# Patient Record
Sex: Male | Born: 1955 | Race: White | Hispanic: No | Marital: Single | Smoking: Former smoker
Health system: Southern US, Community
[De-identification: ages and names within clinical notes are randomized; demographics above are authoritative.]

## PROBLEM LIST (undated history)

## (undated) DIAGNOSIS — K219 Gastro-esophageal reflux disease without esophagitis: Secondary | ICD-10-CM

## (undated) DIAGNOSIS — C140 Malignant neoplasm of pharynx, unspecified: Secondary | ICD-10-CM

## (undated) DIAGNOSIS — E039 Hypothyroidism, unspecified: Secondary | ICD-10-CM

## (undated) DIAGNOSIS — D126 Benign neoplasm of colon, unspecified: Secondary | ICD-10-CM

## (undated) DIAGNOSIS — T4145XA Adverse effect of unspecified anesthetic, initial encounter: Secondary | ICD-10-CM

## (undated) DIAGNOSIS — F329 Major depressive disorder, single episode, unspecified: Secondary | ICD-10-CM

## (undated) DIAGNOSIS — C3490 Malignant neoplasm of unspecified part of unspecified bronchus or lung: Secondary | ICD-10-CM

## (undated) DIAGNOSIS — K645 Perianal venous thrombosis: Secondary | ICD-10-CM

## (undated) DIAGNOSIS — J45909 Unspecified asthma, uncomplicated: Secondary | ICD-10-CM

## (undated) DIAGNOSIS — E559 Vitamin D deficiency, unspecified: Secondary | ICD-10-CM

## (undated) DIAGNOSIS — J438 Other emphysema: Secondary | ICD-10-CM

## (undated) DIAGNOSIS — J309 Allergic rhinitis, unspecified: Secondary | ICD-10-CM

## (undated) DIAGNOSIS — I2510 Atherosclerotic heart disease of native coronary artery without angina pectoris: Secondary | ICD-10-CM

## (undated) DIAGNOSIS — H5440 Blindness, one eye, unspecified eye: Secondary | ICD-10-CM

## (undated) DIAGNOSIS — R0600 Dyspnea, unspecified: Secondary | ICD-10-CM

## (undated) DIAGNOSIS — F419 Anxiety disorder, unspecified: Secondary | ICD-10-CM

## (undated) DIAGNOSIS — T8859XA Other complications of anesthesia, initial encounter: Secondary | ICD-10-CM

## (undated) DIAGNOSIS — F32A Depression, unspecified: Secondary | ICD-10-CM

## (undated) HISTORY — DX: Perianal venous thrombosis: K64.5

## (undated) HISTORY — DX: Hypothyroidism, unspecified: E03.9

## (undated) HISTORY — PX: LUNG LOBECTOMY: SHX167

## (undated) HISTORY — PX: ROTATOR CUFF REPAIR: SHX139

## (undated) HISTORY — DX: Atherosclerotic heart disease of native coronary artery without angina pectoris: I25.10

## (undated) HISTORY — DX: Vitamin D deficiency, unspecified: E55.9

## (undated) HISTORY — DX: Other emphysema: J43.8

## (undated) HISTORY — DX: Dyspnea, unspecified: R06.00

## (undated) HISTORY — PX: COLONOSCOPY: SHX174

## (undated) HISTORY — DX: Allergic rhinitis, unspecified: J30.9

## (undated) HISTORY — PX: SHOULDER SURGERY: SHX246

## (undated) HISTORY — PX: THROAT SURGERY: SHX803

## (undated) HISTORY — DX: Benign neoplasm of colon, unspecified: D12.6

## (undated) HISTORY — DX: Blindness, one eye, unspecified eye: H54.40

## (undated) HISTORY — DX: Unspecified asthma, uncomplicated: J45.909

## (undated) HISTORY — PX: HERNIA REPAIR: SHX51

---
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
## 1998-12-21 ENCOUNTER — Emergency Department (HOSPITAL_COMMUNITY): Admission: EM | Admit: 1998-12-21 | Discharge: 1998-12-21 | Payer: Self-pay | Admitting: Emergency Medicine

## 1999-10-15 ENCOUNTER — Ambulatory Visit (HOSPITAL_COMMUNITY): Admission: RE | Admit: 1999-10-15 | Discharge: 1999-10-15 | Payer: Self-pay | Admitting: Neurosurgery

## 1999-10-15 ENCOUNTER — Encounter: Payer: Self-pay | Admitting: Neurosurgery

## 2005-05-17 ENCOUNTER — Ambulatory Visit (HOSPITAL_COMMUNITY): Admission: RE | Admit: 2005-05-17 | Discharge: 2005-05-17 | Payer: Self-pay | Admitting: Thoracic Surgery

## 2005-05-28 ENCOUNTER — Inpatient Hospital Stay (HOSPITAL_COMMUNITY): Admission: RE | Admit: 2005-05-28 | Discharge: 2005-06-03 | Payer: Self-pay | Admitting: Thoracic Surgery

## 2005-05-28 ENCOUNTER — Encounter (INDEPENDENT_AMBULATORY_CARE_PROVIDER_SITE_OTHER): Payer: Self-pay | Admitting: *Deleted

## 2005-05-30 ENCOUNTER — Ambulatory Visit: Payer: Self-pay | Admitting: Internal Medicine

## 2005-06-11 ENCOUNTER — Encounter: Admission: RE | Admit: 2005-06-11 | Discharge: 2005-06-11 | Payer: Self-pay | Admitting: Thoracic Surgery

## 2005-07-03 ENCOUNTER — Encounter: Admission: RE | Admit: 2005-07-03 | Discharge: 2005-07-03 | Payer: Self-pay | Admitting: Thoracic Surgery

## 2005-07-24 ENCOUNTER — Ambulatory Visit: Payer: Self-pay | Admitting: Internal Medicine

## 2005-08-14 ENCOUNTER — Encounter: Admission: RE | Admit: 2005-08-14 | Discharge: 2005-08-14 | Payer: Self-pay | Admitting: Thoracic Surgery

## 2005-09-11 ENCOUNTER — Ambulatory Visit: Payer: Self-pay | Admitting: Internal Medicine

## 2005-09-23 ENCOUNTER — Ambulatory Visit (HOSPITAL_COMMUNITY): Admission: RE | Admit: 2005-09-23 | Discharge: 2005-09-23 | Payer: Self-pay | Admitting: Internal Medicine

## 2005-10-02 ENCOUNTER — Ambulatory Visit (HOSPITAL_COMMUNITY): Admission: RE | Admit: 2005-10-02 | Discharge: 2005-10-02 | Payer: Self-pay | Admitting: Internal Medicine

## 2005-11-13 ENCOUNTER — Encounter: Admission: RE | Admit: 2005-11-13 | Discharge: 2005-11-13 | Payer: Self-pay | Admitting: Thoracic Surgery

## 2005-12-11 ENCOUNTER — Ambulatory Visit: Payer: Self-pay | Admitting: Internal Medicine

## 2005-12-18 ENCOUNTER — Encounter: Admission: RE | Admit: 2005-12-18 | Discharge: 2005-12-18 | Payer: Self-pay | Admitting: Orthopedic Surgery

## 2006-01-15 ENCOUNTER — Encounter: Admission: RE | Admit: 2006-01-15 | Discharge: 2006-01-15 | Payer: Self-pay | Admitting: Orthopedic Surgery

## 2006-02-11 ENCOUNTER — Encounter: Admission: RE | Admit: 2006-02-11 | Discharge: 2006-02-11 | Payer: Self-pay | Admitting: Otolaryngology

## 2006-02-28 ENCOUNTER — Ambulatory Visit (HOSPITAL_BASED_OUTPATIENT_CLINIC_OR_DEPARTMENT_OTHER): Admission: RE | Admit: 2006-02-28 | Discharge: 2006-03-01 | Payer: Self-pay | Admitting: Otolaryngology

## 2006-02-28 ENCOUNTER — Encounter (INDEPENDENT_AMBULATORY_CARE_PROVIDER_SITE_OTHER): Payer: Self-pay | Admitting: Specialist

## 2006-03-12 ENCOUNTER — Ambulatory Visit: Payer: Self-pay | Admitting: Internal Medicine

## 2006-03-17 ENCOUNTER — Ambulatory Visit (HOSPITAL_COMMUNITY): Admission: RE | Admit: 2006-03-17 | Discharge: 2006-03-17 | Payer: Self-pay | Admitting: Internal Medicine

## 2006-03-24 ENCOUNTER — Ambulatory Visit (HOSPITAL_COMMUNITY): Admission: RE | Admit: 2006-03-24 | Discharge: 2006-03-24 | Payer: Self-pay | Admitting: Internal Medicine

## 2006-04-01 ENCOUNTER — Ambulatory Visit (HOSPITAL_COMMUNITY): Admission: RE | Admit: 2006-04-01 | Discharge: 2006-04-01 | Payer: Self-pay | Admitting: Thoracic Surgery

## 2006-04-01 ENCOUNTER — Encounter (INDEPENDENT_AMBULATORY_CARE_PROVIDER_SITE_OTHER): Payer: Self-pay | Admitting: *Deleted

## 2006-04-01 ENCOUNTER — Ambulatory Visit: Admission: RE | Admit: 2006-04-01 | Discharge: 2006-06-10 | Payer: Self-pay | Admitting: Radiation Oncology

## 2006-04-07 LAB — CBC WITH DIFFERENTIAL/PLATELET
Basophils Absolute: 0.1 10*3/uL (ref 0.0–0.1)
EOS%: 1.6 % (ref 0.0–7.0)
Eosinophils Absolute: 0.1 10*3/uL (ref 0.0–0.5)
HGB: 15.3 g/dL (ref 13.0–17.1)
MCV: 82.2 fL (ref 81.6–98.0)
MONO%: 8.6 % (ref 0.0–13.0)
NEUT#: 4.2 10*3/uL (ref 1.5–6.5)
RBC: 5.36 10*6/uL (ref 4.20–5.71)
RDW: 11.9 % (ref 11.2–14.6)
WBC: 6.5 10*3/uL (ref 4.0–10.0)
lymph#: 1.5 10*3/uL (ref 0.9–3.3)

## 2006-04-07 LAB — COMPREHENSIVE METABOLIC PANEL
AST: 17 U/L (ref 0–37)
Albumin: 4.7 g/dL (ref 3.5–5.2)
Alkaline Phosphatase: 116 U/L (ref 39–117)
Calcium: 8.9 mg/dL (ref 8.4–10.5)
Chloride: 107 mEq/L (ref 96–112)
Glucose, Bld: 99 mg/dL (ref 70–99)
Potassium: 3.9 mEq/L (ref 3.5–5.3)
Sodium: 140 mEq/L (ref 135–145)
Total Protein: 7 g/dL (ref 6.0–8.3)

## 2006-04-14 LAB — COMPREHENSIVE METABOLIC PANEL
ALT: 36 U/L (ref 0–40)
AST: 29 U/L (ref 0–37)
Albumin: 5 g/dL (ref 3.5–5.2)
Alkaline Phosphatase: 129 U/L — ABNORMAL HIGH (ref 39–117)
Chloride: 104 mEq/L (ref 96–112)
Potassium: 3.8 mEq/L (ref 3.5–5.3)
Sodium: 139 mEq/L (ref 135–145)
Total Protein: 7.8 g/dL (ref 6.0–8.3)

## 2006-04-14 LAB — CBC WITH DIFFERENTIAL/PLATELET
BASO%: 1.6 % (ref 0.0–2.0)
EOS%: 1.6 % (ref 0.0–7.0)
MCH: 29.4 pg (ref 28.0–33.4)
MCV: 82.3 fL (ref 81.6–98.0)
MONO%: 6.1 % (ref 0.0–13.0)
RBC: 5.36 10*6/uL (ref 4.20–5.71)
RDW: 11.5 % (ref 11.2–14.6)
lymph#: 1.2 10*3/uL (ref 0.9–3.3)

## 2006-04-21 LAB — CBC WITH DIFFERENTIAL/PLATELET
BASO%: 0.3 % (ref 0.0–2.0)
Basophils Absolute: 0 10*3/uL (ref 0.0–0.1)
EOS%: 1.1 % (ref 0.0–7.0)
Eosinophils Absolute: 0.1 10*3/uL (ref 0.0–0.5)
HCT: 39.7 % (ref 38.7–49.9)
HGB: 13.7 g/dL (ref 13.0–17.1)
LYMPH%: 12 % — ABNORMAL LOW (ref 14.0–48.0)
MCH: 29.3 pg (ref 28.0–33.4)
MCHC: 34.6 g/dL (ref 32.0–35.9)
MCV: 84.8 fL (ref 81.6–98.0)
MONO#: 0.5 10*3/uL (ref 0.1–0.9)
MONO%: 8.4 % (ref 0.0–13.0)
NEUT#: 5 10*3/uL (ref 1.5–6.5)
NEUT%: 78.2 % — ABNORMAL HIGH (ref 40.0–75.0)
Platelets: 213 10*3/uL (ref 145–400)
RBC: 4.69 10*6/uL (ref 4.20–5.71)
RDW: 12.9 % (ref 11.2–14.6)
WBC: 6.4 10*3/uL (ref 4.0–10.0)
lymph#: 0.8 10*3/uL — ABNORMAL LOW (ref 0.9–3.3)

## 2006-04-21 LAB — COMPREHENSIVE METABOLIC PANEL
Alkaline Phosphatase: 140 U/L — ABNORMAL HIGH (ref 39–117)
BUN: 14 mg/dL (ref 6–23)
Creatinine, Ser: 0.9 mg/dL (ref 0.4–1.5)
Glucose, Bld: 99 mg/dL (ref 70–99)
Total Bilirubin: 0.9 mg/dL (ref 0.3–1.2)

## 2006-04-28 ENCOUNTER — Ambulatory Visit: Payer: Self-pay | Admitting: Internal Medicine

## 2006-04-28 LAB — CBC WITH DIFFERENTIAL/PLATELET
Basophils Absolute: 0.1 10*3/uL (ref 0.0–0.1)
Eosinophils Absolute: 0.1 10*3/uL (ref 0.0–0.5)
HCT: 40 % (ref 38.7–49.9)
HGB: 14.3 g/dL (ref 13.0–17.1)
LYMPH%: 10.8 % — ABNORMAL LOW (ref 14.0–48.0)
MCV: 81.6 fL (ref 81.6–98.0)
MONO%: 10.9 % (ref 0.0–13.0)
NEUT#: 4 10*3/uL (ref 1.5–6.5)
NEUT%: 75.6 % — ABNORMAL HIGH (ref 40.0–75.0)
Platelets: 213 10*3/uL (ref 145–400)
RDW: 11.4 % (ref 11.2–14.6)

## 2006-04-28 LAB — COMPREHENSIVE METABOLIC PANEL
Albumin: 4.7 g/dL (ref 3.5–5.2)
Alkaline Phosphatase: 149 U/L — ABNORMAL HIGH (ref 39–117)
BUN: 15 mg/dL (ref 6–23)
Calcium: 9 mg/dL (ref 8.4–10.5)
Glucose, Bld: 83 mg/dL (ref 70–99)
Potassium: 4.2 mEq/L (ref 3.5–5.3)

## 2006-04-29 ENCOUNTER — Observation Stay (HOSPITAL_COMMUNITY): Admission: EM | Admit: 2006-04-29 | Discharge: 2006-04-30 | Payer: Self-pay | Admitting: Emergency Medicine

## 2006-04-29 ENCOUNTER — Ambulatory Visit: Payer: Self-pay | Admitting: Internal Medicine

## 2006-05-05 LAB — COMPREHENSIVE METABOLIC PANEL
AST: 30 U/L (ref 0–37)
Albumin: 4.4 g/dL (ref 3.5–5.2)
Alkaline Phosphatase: 133 U/L — ABNORMAL HIGH (ref 39–117)
BUN: 16 mg/dL (ref 6–23)
Glucose, Bld: 93 mg/dL (ref 70–99)
Potassium: 4.2 mEq/L (ref 3.5–5.3)
Total Bilirubin: 0.9 mg/dL (ref 0.3–1.2)

## 2006-05-05 LAB — CBC WITH DIFFERENTIAL/PLATELET
Basophils Absolute: 0.1 10*3/uL (ref 0.0–0.1)
EOS%: 1.2 % (ref 0.0–7.0)
Eosinophils Absolute: 0.1 10*3/uL (ref 0.0–0.5)
HGB: 13.8 g/dL (ref 13.0–17.1)
LYMPH%: 12.5 % — ABNORMAL LOW (ref 14.0–48.0)
MCH: 30.1 pg (ref 28.0–33.4)
MCV: UNDETERMINED fL (ref 81.6–98.0)
MONO%: 10.1 % (ref 0.0–13.0)
Platelets: 159 10*3/uL (ref 145–400)
RBC: UNDETERMINED 10*6/uL (ref 4.20–5.71)
RDW: UNDETERMINED % (ref 11.2–14.6)

## 2006-05-12 LAB — CBC WITH DIFFERENTIAL/PLATELET
Basophils Absolute: 0.1 10*3/uL (ref 0.0–0.1)
EOS%: 0.8 % (ref 0.0–7.0)
Eosinophils Absolute: 0 10*3/uL (ref 0.0–0.5)
HCT: 36 % — ABNORMAL LOW (ref 38.7–49.9)
HGB: 13.1 g/dL (ref 13.0–17.1)
LYMPH%: 9.4 % — ABNORMAL LOW (ref 14.0–48.0)
MCH: 30 pg (ref 28.0–33.4)
MCV: 82.3 fL (ref 81.6–98.0)
MONO%: 13 % (ref 0.0–13.0)
NEUT#: 4.1 10*3/uL (ref 1.5–6.5)
NEUT%: 75 % (ref 40.0–75.0)
Platelets: 195 10*3/uL (ref 145–400)
RDW: 12.2 % (ref 11.2–14.6)

## 2006-05-12 LAB — COMPREHENSIVE METABOLIC PANEL
AST: 26 U/L (ref 0–37)
Albumin: 4.1 g/dL (ref 3.5–5.2)
Alkaline Phosphatase: 166 U/L — ABNORMAL HIGH (ref 39–117)
BUN: 12 mg/dL (ref 6–23)
Creatinine, Ser: 0.8 mg/dL (ref 0.4–1.5)
Glucose, Bld: 100 mg/dL — ABNORMAL HIGH (ref 70–99)
Potassium: 4 mEq/L (ref 3.5–5.3)

## 2006-05-15 LAB — COMPREHENSIVE METABOLIC PANEL
Albumin: 4.5 g/dL (ref 3.5–5.2)
Alkaline Phosphatase: 184 U/L — ABNORMAL HIGH (ref 39–117)
BUN: 15 mg/dL (ref 6–23)
CO2: 27 mEq/L (ref 19–32)
Calcium: 9.4 mg/dL (ref 8.4–10.5)
Chloride: 97 mEq/L (ref 96–112)
Glucose, Bld: 112 mg/dL — ABNORMAL HIGH (ref 70–99)
Potassium: 4.2 mEq/L (ref 3.5–5.3)
Sodium: 142 mEq/L (ref 135–145)
Total Protein: 7.8 g/dL (ref 6.0–8.3)

## 2006-05-15 LAB — CBC WITH DIFFERENTIAL/PLATELET
Basophils Absolute: 0 10*3/uL (ref 0.0–0.1)
Eosinophils Absolute: 0 10*3/uL (ref 0.0–0.5)
HCT: 41.3 % (ref 38.7–49.9)
HGB: 14.4 g/dL (ref 13.0–17.1)
MCV: 85.1 fL (ref 81.6–98.0)
MONO%: 13.1 % — ABNORMAL HIGH (ref 0.0–13.0)
NEUT#: 3.9 10*3/uL (ref 1.5–6.5)
NEUT%: 75.6 % — ABNORMAL HIGH (ref 40.0–75.0)
RDW: 13.4 % (ref 11.2–14.6)
lymph#: 0.5 10*3/uL — ABNORMAL LOW (ref 0.9–3.3)

## 2006-06-04 ENCOUNTER — Encounter: Admission: RE | Admit: 2006-06-04 | Discharge: 2006-06-04 | Payer: Self-pay | Admitting: Thoracic Surgery

## 2006-06-23 ENCOUNTER — Ambulatory Visit: Payer: Self-pay | Admitting: Internal Medicine

## 2006-06-24 ENCOUNTER — Ambulatory Visit (HOSPITAL_COMMUNITY): Admission: RE | Admit: 2006-06-24 | Discharge: 2006-06-24 | Payer: Self-pay | Admitting: Internal Medicine

## 2006-06-26 LAB — CBC WITH DIFFERENTIAL/PLATELET
BASO%: 0.8 % (ref 0.0–2.0)
Eosinophils Absolute: 0.2 10*3/uL (ref 0.0–0.5)
LYMPH%: 13.3 % — ABNORMAL LOW (ref 14.0–48.0)
MCHC: 34.5 g/dL (ref 32.0–35.9)
MCV: 88.6 fL (ref 81.6–98.0)
MONO#: 0.5 10*3/uL (ref 0.1–0.9)
MONO%: 11.3 % (ref 0.0–13.0)
NEUT#: 3.1 10*3/uL (ref 1.5–6.5)
Platelets: 175 10*3/uL (ref 145–400)
RBC: 4.23 10*6/uL (ref 4.20–5.71)
RDW: 15 % — ABNORMAL HIGH (ref 11.2–14.6)
WBC: 4.4 10*3/uL (ref 4.0–10.0)

## 2006-06-26 LAB — COMPREHENSIVE METABOLIC PANEL
ALT: 20 U/L (ref 0–40)
AST: 20 U/L (ref 0–37)
Albumin: 4.3 g/dL (ref 3.5–5.2)
Alkaline Phosphatase: 117 U/L (ref 39–117)
Glucose, Bld: 82 mg/dL (ref 70–99)
Potassium: 3.6 mEq/L (ref 3.5–5.3)
Sodium: 140 mEq/L (ref 135–145)
Total Bilirubin: 0.8 mg/dL (ref 0.3–1.2)
Total Protein: 6.9 g/dL (ref 6.0–8.3)

## 2006-07-03 LAB — CBC WITH DIFFERENTIAL/PLATELET
Eosinophils Absolute: 0.2 10*3/uL (ref 0.0–0.5)
LYMPH%: 13.6 % — ABNORMAL LOW (ref 14.0–48.0)
MCH: 30.5 pg (ref 28.0–33.4)
MCHC: 34.1 g/dL (ref 32.0–35.9)
MCV: 89.5 fL (ref 81.6–98.0)
MONO%: 10.7 % (ref 0.0–13.0)
NEUT#: 3.5 10*3/uL (ref 1.5–6.5)
Platelets: 183 10*3/uL (ref 145–400)
RBC: 4.48 10*6/uL (ref 4.20–5.71)

## 2006-07-03 LAB — COMPREHENSIVE METABOLIC PANEL
Alkaline Phosphatase: 117 U/L (ref 39–117)
Glucose, Bld: 96 mg/dL (ref 70–99)
Sodium: 141 mEq/L (ref 135–145)
Total Bilirubin: 0.5 mg/dL (ref 0.3–1.2)
Total Protein: 7.2 g/dL (ref 6.0–8.3)

## 2006-07-10 LAB — CBC WITH DIFFERENTIAL/PLATELET
BASO%: 0.3 % (ref 0.0–2.0)
Basophils Absolute: 0 10*3/uL (ref 0.0–0.1)
EOS%: 0 % (ref 0.0–7.0)
Eosinophils Absolute: 0 10*3/uL (ref 0.0–0.5)
HCT: 41.1 % (ref 38.7–49.9)
HGB: 14.4 g/dL (ref 13.0–17.1)
LYMPH%: 3.6 % — ABNORMAL LOW (ref 14.0–48.0)
MCH: 30.8 pg (ref 28.0–33.4)
MCHC: 35.1 g/dL (ref 32.0–35.9)
MCV: 87.7 fL (ref 81.6–98.0)
MONO#: 0.4 10*3/uL (ref 0.1–0.9)
MONO%: 3.2 % (ref 0.0–13.0)
NEUT#: 12.6 10*3/uL — ABNORMAL HIGH (ref 1.5–6.5)
NEUT%: 92.9 % — ABNORMAL HIGH (ref 40.0–75.0)
Platelets: 225 10*3/uL (ref 145–400)
RBC: 4.69 10*6/uL (ref 4.20–5.71)
RDW: 11.6 % (ref 11.2–14.6)
WBC: 13.6 10*3/uL — ABNORMAL HIGH (ref 4.0–10.0)
lymph#: 0.5 10*3/uL — ABNORMAL LOW (ref 0.9–3.3)

## 2006-07-17 LAB — CBC WITH DIFFERENTIAL/PLATELET
Eosinophils Absolute: 0 10*3/uL (ref 0.0–0.5)
HCT: 40.9 % (ref 38.7–49.9)
LYMPH%: 7.5 % — ABNORMAL LOW (ref 14.0–48.0)
MONO#: 1.2 10*3/uL — ABNORMAL HIGH (ref 0.1–0.9)
NEUT#: 7.8 10*3/uL — ABNORMAL HIGH (ref 1.5–6.5)
Platelets: 139 10*3/uL — ABNORMAL LOW (ref 145–400)
RBC: 4.59 10*6/uL (ref 4.20–5.71)
WBC: 9.7 10*3/uL (ref 4.0–10.0)
lymph#: 0.7 10*3/uL — ABNORMAL LOW (ref 0.9–3.3)

## 2006-07-17 LAB — COMPREHENSIVE METABOLIC PANEL
Albumin: 4.4 g/dL (ref 3.5–5.2)
CO2: 28 mEq/L (ref 19–32)
Calcium: 9.2 mg/dL (ref 8.4–10.5)
Chloride: 102 mEq/L (ref 96–112)
Glucose, Bld: 108 mg/dL — ABNORMAL HIGH (ref 70–99)
Potassium: 3.5 mEq/L (ref 3.5–5.3)
Sodium: 142 mEq/L (ref 135–145)
Total Bilirubin: 0.6 mg/dL (ref 0.3–1.2)
Total Protein: 7 g/dL (ref 6.0–8.3)

## 2006-07-24 LAB — CBC WITH DIFFERENTIAL/PLATELET
BASO%: 0.3 % (ref 0.0–2.0)
Eosinophils Absolute: 0 10*3/uL (ref 0.0–0.5)
MCHC: 34.3 g/dL (ref 32.0–35.9)
MONO#: 0.6 10*3/uL (ref 0.1–0.9)
NEUT#: 7.4 10*3/uL — ABNORMAL HIGH (ref 1.5–6.5)
RBC: 4.33 10*6/uL (ref 4.20–5.71)
RDW: 12.4 % (ref 11.2–14.6)
WBC: 8.6 10*3/uL (ref 4.0–10.0)
lymph#: 0.7 10*3/uL — ABNORMAL LOW (ref 0.9–3.3)

## 2006-07-24 LAB — COMPREHENSIVE METABOLIC PANEL
ALT: 20 U/L (ref 0–40)
Albumin: 4.1 g/dL (ref 3.5–5.2)
CO2: 30 mEq/L (ref 19–32)
Glucose, Bld: 89 mg/dL (ref 70–99)
Potassium: 4.5 mEq/L (ref 3.5–5.3)
Sodium: 139 mEq/L (ref 135–145)
Total Protein: 6.7 g/dL (ref 6.0–8.3)

## 2006-07-31 LAB — CBC WITH DIFFERENTIAL/PLATELET
Basophils Absolute: 0.1 10*3/uL (ref 0.0–0.1)
Eosinophils Absolute: 0 10*3/uL (ref 0.0–0.5)
HCT: 36.1 % — ABNORMAL LOW (ref 38.7–49.9)
HGB: 12.5 g/dL — ABNORMAL LOW (ref 13.0–17.1)
LYMPH%: 2.7 % — ABNORMAL LOW (ref 14.0–48.0)
MONO#: 0.6 10*3/uL (ref 0.1–0.9)
NEUT#: 11.9 10*3/uL — ABNORMAL HIGH (ref 1.5–6.5)
Platelets: 296 10*3/uL (ref 145–400)
RBC: 4.11 10*6/uL — ABNORMAL LOW (ref 4.20–5.71)
WBC: 13 10*3/uL — ABNORMAL HIGH (ref 4.0–10.0)

## 2006-08-14 ENCOUNTER — Ambulatory Visit: Payer: Self-pay | Admitting: Internal Medicine

## 2006-08-14 LAB — COMPREHENSIVE METABOLIC PANEL
CO2: 25 mEq/L (ref 19–32)
Creatinine, Ser: 0.95 mg/dL (ref 0.40–1.50)
Glucose, Bld: 88 mg/dL (ref 70–99)
Total Bilirubin: 0.6 mg/dL (ref 0.3–1.2)

## 2006-08-14 LAB — CBC WITH DIFFERENTIAL/PLATELET
Eosinophils Absolute: 0 10*3/uL (ref 0.0–0.5)
HCT: 40 % (ref 38.7–49.9)
LYMPH%: 5.3 % — ABNORMAL LOW (ref 14.0–48.0)
MCHC: 34 g/dL (ref 32.0–35.9)
MONO#: 0.7 10*3/uL (ref 0.1–0.9)
NEUT#: 13.1 10*3/uL — ABNORMAL HIGH (ref 1.5–6.5)
NEUT%: 89.1 % — ABNORMAL HIGH (ref 40.0–75.0)
Platelets: 225 10*3/uL (ref 145–400)
WBC: 14.6 10*3/uL — ABNORMAL HIGH (ref 4.0–10.0)

## 2006-09-04 ENCOUNTER — Ambulatory Visit (HOSPITAL_COMMUNITY): Admission: RE | Admit: 2006-09-04 | Discharge: 2006-09-04 | Payer: Self-pay | Admitting: Internal Medicine

## 2006-09-11 LAB — CBC WITH DIFFERENTIAL/PLATELET
Basophils Absolute: 0 10*3/uL (ref 0.0–0.1)
Eosinophils Absolute: 0 10*3/uL (ref 0.0–0.5)
HCT: 37.5 % — ABNORMAL LOW (ref 38.7–49.9)
HGB: 12.8 g/dL — ABNORMAL LOW (ref 13.0–17.1)
NEUT#: 7.4 10*3/uL — ABNORMAL HIGH (ref 1.5–6.5)
RDW: 14.2 % (ref 11.2–14.6)
lymph#: 0.6 10*3/uL — ABNORMAL LOW (ref 0.9–3.3)

## 2006-09-11 LAB — BASIC METABOLIC PANEL
BUN: 11 mg/dL (ref 6–23)
CO2: 29 mEq/L (ref 19–32)
Chloride: 104 mEq/L (ref 96–112)
Glucose, Bld: 109 mg/dL — ABNORMAL HIGH (ref 70–99)
Potassium: 4.2 mEq/L (ref 3.5–5.3)

## 2006-09-30 ENCOUNTER — Ambulatory Visit (HOSPITAL_COMMUNITY): Admission: RE | Admit: 2006-09-30 | Discharge: 2006-09-30 | Payer: Self-pay | Admitting: Internal Medicine

## 2006-10-02 ENCOUNTER — Ambulatory Visit: Payer: Self-pay | Admitting: Internal Medicine

## 2006-12-31 ENCOUNTER — Ambulatory Visit: Payer: Self-pay | Admitting: Internal Medicine

## 2007-01-05 LAB — COMPREHENSIVE METABOLIC PANEL
Albumin: 4.2 g/dL (ref 3.5–5.2)
BUN: 14 mg/dL (ref 6–23)
CO2: 26 mEq/L (ref 19–32)
Calcium: 9.4 mg/dL (ref 8.4–10.5)
Chloride: 105 mEq/L (ref 96–112)
Glucose, Bld: 113 mg/dL — ABNORMAL HIGH (ref 70–99)
Potassium: 3.5 mEq/L (ref 3.5–5.3)

## 2007-01-05 LAB — CBC WITH DIFFERENTIAL/PLATELET
Basophils Absolute: 0 10*3/uL (ref 0.0–0.1)
Eosinophils Absolute: 0.1 10*3/uL (ref 0.0–0.5)
HGB: 13.8 g/dL (ref 13.0–17.1)
MCV: 82.5 fL (ref 81.6–98.0)
NEUT#: 7 10*3/uL — ABNORMAL HIGH (ref 1.5–6.5)
RDW: 13.5 % (ref 11.2–14.6)
lymph#: 1 10*3/uL (ref 0.9–3.3)

## 2007-01-08 ENCOUNTER — Ambulatory Visit (HOSPITAL_COMMUNITY): Admission: RE | Admit: 2007-01-08 | Discharge: 2007-01-08 | Payer: Self-pay | Admitting: Internal Medicine

## 2007-04-02 ENCOUNTER — Inpatient Hospital Stay (HOSPITAL_COMMUNITY): Admission: AD | Admit: 2007-04-02 | Discharge: 2007-04-05 | Payer: Self-pay | Admitting: Internal Medicine

## 2007-04-10 ENCOUNTER — Ambulatory Visit: Payer: Self-pay | Admitting: Internal Medicine

## 2007-05-20 ENCOUNTER — Ambulatory Visit: Payer: Self-pay | Admitting: Pulmonary Disease

## 2007-05-22 ENCOUNTER — Ambulatory Visit: Payer: Self-pay | Admitting: Internal Medicine

## 2007-06-24 ENCOUNTER — Ambulatory Visit: Payer: Self-pay | Admitting: Pulmonary Disease

## 2007-08-03 ENCOUNTER — Ambulatory Visit: Payer: Self-pay | Admitting: Internal Medicine

## 2007-08-07 ENCOUNTER — Ambulatory Visit (HOSPITAL_COMMUNITY): Admission: RE | Admit: 2007-08-07 | Discharge: 2007-08-07 | Payer: Self-pay | Admitting: Internal Medicine

## 2007-10-19 ENCOUNTER — Ambulatory Visit: Payer: Self-pay | Admitting: Pulmonary Disease

## 2007-12-01 ENCOUNTER — Ambulatory Visit: Payer: Self-pay | Admitting: Internal Medicine

## 2007-12-03 LAB — COMPREHENSIVE METABOLIC PANEL
AST: 27 U/L (ref 0–37)
Albumin: 4.7 g/dL (ref 3.5–5.2)
BUN: 12 mg/dL (ref 6–23)
Calcium: 9.3 mg/dL (ref 8.4–10.5)
Chloride: 104 mEq/L (ref 96–112)
Glucose, Bld: 82 mg/dL (ref 70–99)
Potassium: 3.7 mEq/L (ref 3.5–5.3)
Total Protein: 7.7 g/dL (ref 6.0–8.3)

## 2007-12-03 LAB — CBC WITH DIFFERENTIAL/PLATELET
Basophils Absolute: 0 10*3/uL (ref 0.0–0.1)
EOS%: 1.8 % (ref 0.0–7.0)
Eosinophils Absolute: 0.1 10*3/uL (ref 0.0–0.5)
HGB: 15.1 g/dL (ref 13.0–17.1)
NEUT#: 4.8 10*3/uL (ref 1.5–6.5)
RDW: 13.3 % (ref 11.2–14.6)
lymph#: 1 10*3/uL (ref 0.9–3.3)

## 2007-12-07 ENCOUNTER — Ambulatory Visit (HOSPITAL_COMMUNITY): Admission: RE | Admit: 2007-12-07 | Discharge: 2007-12-07 | Payer: Self-pay | Admitting: Internal Medicine

## 2007-12-31 DIAGNOSIS — D126 Benign neoplasm of colon, unspecified: Secondary | ICD-10-CM

## 2007-12-31 HISTORY — DX: Benign neoplasm of colon, unspecified: D12.6

## 2008-03-29 ENCOUNTER — Ambulatory Visit: Payer: Self-pay | Admitting: Internal Medicine

## 2008-03-31 LAB — CBC WITH DIFFERENTIAL/PLATELET
Basophils Absolute: 0 10*3/uL (ref 0.0–0.1)
Eosinophils Absolute: 0.2 10*3/uL (ref 0.0–0.5)
HCT: 44.2 % (ref 38.7–49.9)
LYMPH%: 13.8 % — ABNORMAL LOW (ref 14.0–48.0)
MCV: 86.1 fL (ref 81.6–98.0)
MONO#: 0.6 10*3/uL (ref 0.1–0.9)
MONO%: 8.4 % (ref 0.0–13.0)
NEUT#: 5.2 10*3/uL (ref 1.5–6.5)
NEUT%: 75 % (ref 40.0–75.0)
Platelets: 205 10*3/uL (ref 145–400)
WBC: 6.9 10*3/uL (ref 4.0–10.0)

## 2008-03-31 LAB — COMPREHENSIVE METABOLIC PANEL
BUN: 16 mg/dL (ref 6–23)
CO2: 28 mEq/L (ref 19–32)
Creatinine, Ser: 0.92 mg/dL (ref 0.40–1.50)
Glucose, Bld: 103 mg/dL — ABNORMAL HIGH (ref 70–99)
Total Bilirubin: 0.5 mg/dL (ref 0.3–1.2)
Total Protein: 7.9 g/dL (ref 6.0–8.3)

## 2008-04-04 ENCOUNTER — Ambulatory Visit (HOSPITAL_COMMUNITY): Admission: RE | Admit: 2008-04-04 | Discharge: 2008-04-04 | Payer: Self-pay | Admitting: Internal Medicine

## 2008-04-15 DIAGNOSIS — J309 Allergic rhinitis, unspecified: Secondary | ICD-10-CM | POA: Insufficient documentation

## 2008-04-18 ENCOUNTER — Ambulatory Visit: Payer: Self-pay | Admitting: Pulmonary Disease

## 2008-06-17 ENCOUNTER — Ambulatory Visit: Payer: Self-pay | Admitting: Gastroenterology

## 2008-06-27 ENCOUNTER — Encounter: Payer: Self-pay | Admitting: Gastroenterology

## 2008-06-27 ENCOUNTER — Ambulatory Visit: Payer: Self-pay | Admitting: Gastroenterology

## 2008-06-29 ENCOUNTER — Encounter: Payer: Self-pay | Admitting: Gastroenterology

## 2008-07-30 ENCOUNTER — Ambulatory Visit: Payer: Self-pay | Admitting: Internal Medicine

## 2008-08-02 LAB — COMPREHENSIVE METABOLIC PANEL
Alkaline Phosphatase: 126 U/L — ABNORMAL HIGH (ref 39–117)
CO2: 27 mEq/L (ref 19–32)
Creatinine, Ser: 0.96 mg/dL (ref 0.40–1.50)
Glucose, Bld: 74 mg/dL (ref 70–99)
Total Bilirubin: 0.6 mg/dL (ref 0.3–1.2)

## 2008-08-02 LAB — CBC WITH DIFFERENTIAL/PLATELET
Basophils Absolute: 0 10*3/uL (ref 0.0–0.1)
Eosinophils Absolute: 0.2 10*3/uL (ref 0.0–0.5)
HGB: 14.4 g/dL (ref 13.0–17.1)
LYMPH%: 12.3 % — ABNORMAL LOW (ref 14.0–48.0)
MCV: 85.9 fL (ref 81.6–98.0)
MONO%: 9.1 % (ref 0.0–13.0)
NEUT#: 4 10*3/uL (ref 1.5–6.5)
Platelets: 165 10*3/uL (ref 145–400)
RDW: 12.6 % (ref 11.2–14.6)

## 2008-08-04 ENCOUNTER — Ambulatory Visit (HOSPITAL_COMMUNITY): Admission: RE | Admit: 2008-08-04 | Discharge: 2008-08-04 | Payer: Self-pay | Admitting: Internal Medicine

## 2008-09-02 ENCOUNTER — Ambulatory Visit (HOSPITAL_COMMUNITY): Admission: RE | Admit: 2008-09-02 | Discharge: 2008-09-02 | Payer: Self-pay | Admitting: Internal Medicine

## 2008-10-18 ENCOUNTER — Ambulatory Visit: Payer: Self-pay | Admitting: Pulmonary Disease

## 2009-01-30 ENCOUNTER — Ambulatory Visit: Payer: Self-pay | Admitting: Internal Medicine

## 2009-02-01 ENCOUNTER — Ambulatory Visit (HOSPITAL_COMMUNITY): Admission: RE | Admit: 2009-02-01 | Discharge: 2009-02-01 | Payer: Self-pay | Admitting: Internal Medicine

## 2009-02-01 LAB — COMPREHENSIVE METABOLIC PANEL
Alkaline Phosphatase: 114 U/L (ref 39–117)
BUN: 18 mg/dL (ref 6–23)
Glucose, Bld: 125 mg/dL — ABNORMAL HIGH (ref 70–99)
Total Bilirubin: 0.7 mg/dL (ref 0.3–1.2)

## 2009-02-01 LAB — CBC WITH DIFFERENTIAL/PLATELET
Basophils Absolute: 0 10*3/uL (ref 0.0–0.1)
Eosinophils Absolute: 0 10*3/uL (ref 0.0–0.5)
HGB: 15.4 g/dL (ref 13.0–17.1)
LYMPH%: 4.9 % — ABNORMAL LOW (ref 14.0–48.0)
MCV: 86.1 fL (ref 81.6–98.0)
MONO%: 4.1 % (ref 0.0–13.0)
NEUT#: 12.1 10*3/uL — ABNORMAL HIGH (ref 1.5–6.5)
Platelets: 222 10*3/uL (ref 145–400)

## 2009-04-17 ENCOUNTER — Ambulatory Visit: Payer: Self-pay | Admitting: Pulmonary Disease

## 2009-07-31 ENCOUNTER — Ambulatory Visit: Payer: Self-pay | Admitting: Internal Medicine

## 2009-08-02 ENCOUNTER — Ambulatory Visit (HOSPITAL_COMMUNITY): Admission: RE | Admit: 2009-08-02 | Discharge: 2009-08-02 | Payer: Self-pay | Admitting: Internal Medicine

## 2009-08-02 LAB — CBC WITH DIFFERENTIAL/PLATELET
EOS%: 3.8 % (ref 0.0–7.0)
Eosinophils Absolute: 0.2 10*3/uL (ref 0.0–0.5)
LYMPH%: 13.4 % — ABNORMAL LOW (ref 14.0–49.0)
MCH: 29 pg (ref 27.2–33.4)
MCV: 83.1 fL (ref 79.3–98.0)
MONO%: 10.7 % (ref 0.0–14.0)
NEUT#: 3.4 10*3/uL (ref 1.5–6.5)
Platelets: 162 10*3/uL (ref 140–400)
RBC: 4.97 10*6/uL (ref 4.20–5.82)

## 2009-08-02 LAB — COMPREHENSIVE METABOLIC PANEL
Alkaline Phosphatase: 100 U/L (ref 39–117)
BUN: 15 mg/dL (ref 6–23)
Glucose, Bld: 93 mg/dL (ref 70–99)
Sodium: 141 mEq/L (ref 135–145)
Total Bilirubin: 0.7 mg/dL (ref 0.3–1.2)
Total Protein: 7.2 g/dL (ref 6.0–8.3)

## 2010-02-01 ENCOUNTER — Ambulatory Visit: Payer: Self-pay | Admitting: Internal Medicine

## 2010-02-05 ENCOUNTER — Ambulatory Visit (HOSPITAL_COMMUNITY): Admission: RE | Admit: 2010-02-05 | Discharge: 2010-02-05 | Payer: Self-pay | Admitting: Internal Medicine

## 2010-02-05 LAB — CBC WITH DIFFERENTIAL/PLATELET
BASO%: 0.4 % (ref 0.0–2.0)
EOS%: 3.8 % (ref 0.0–7.0)
HCT: 45 % (ref 38.4–49.9)
MCH: 29.4 pg (ref 27.2–33.4)
MCHC: 33.9 g/dL (ref 32.0–36.0)
MONO#: 0.5 10*3/uL (ref 0.1–0.9)
NEUT%: 71.6 % (ref 39.0–75.0)
RBC: 5.19 10*6/uL (ref 4.20–5.82)
RDW: 12.9 % (ref 11.0–14.6)
WBC: 5.6 10*3/uL (ref 4.0–10.3)
lymph#: 0.8 10*3/uL — ABNORMAL LOW (ref 0.9–3.3)

## 2010-02-05 LAB — COMPREHENSIVE METABOLIC PANEL
ALT: 22 U/L (ref 0–53)
AST: 20 U/L (ref 0–37)
Calcium: 9.8 mg/dL (ref 8.4–10.5)
Chloride: 103 mEq/L (ref 96–112)
Creatinine, Ser: 0.93 mg/dL (ref 0.40–1.50)
Sodium: 140 mEq/L (ref 135–145)
Total Bilirubin: 0.6 mg/dL (ref 0.3–1.2)
Total Protein: 7.5 g/dL (ref 6.0–8.3)

## 2010-02-06 ENCOUNTER — Encounter: Payer: Self-pay | Admitting: Sports Medicine

## 2010-02-15 ENCOUNTER — Encounter: Payer: Self-pay | Admitting: Family Medicine

## 2010-02-15 ENCOUNTER — Ambulatory Visit: Payer: Self-pay | Admitting: Sports Medicine

## 2010-02-15 DIAGNOSIS — M75120 Complete rotator cuff tear or rupture of unspecified shoulder, not specified as traumatic: Secondary | ICD-10-CM

## 2010-02-16 ENCOUNTER — Encounter: Payer: Self-pay | Admitting: Occupational Medicine

## 2010-04-13 ENCOUNTER — Telehealth (INDEPENDENT_AMBULATORY_CARE_PROVIDER_SITE_OTHER): Payer: Self-pay | Admitting: *Deleted

## 2010-04-13 ENCOUNTER — Ambulatory Visit: Payer: Self-pay | Admitting: Pulmonary Disease

## 2010-04-26 ENCOUNTER — Ambulatory Visit (HOSPITAL_BASED_OUTPATIENT_CLINIC_OR_DEPARTMENT_OTHER): Admission: RE | Admit: 2010-04-26 | Discharge: 2010-04-26 | Payer: Self-pay | Admitting: Unknown Physician Specialty

## 2010-06-04 ENCOUNTER — Telehealth (INDEPENDENT_AMBULATORY_CARE_PROVIDER_SITE_OTHER): Payer: Self-pay | Admitting: *Deleted

## 2010-06-21 ENCOUNTER — Telehealth (INDEPENDENT_AMBULATORY_CARE_PROVIDER_SITE_OTHER): Payer: Self-pay | Admitting: *Deleted

## 2010-08-01 ENCOUNTER — Ambulatory Visit (HOSPITAL_BASED_OUTPATIENT_CLINIC_OR_DEPARTMENT_OTHER): Payer: 59 | Admitting: Internal Medicine

## 2010-08-02 ENCOUNTER — Telehealth (INDEPENDENT_AMBULATORY_CARE_PROVIDER_SITE_OTHER): Payer: Self-pay | Admitting: *Deleted

## 2010-08-03 ENCOUNTER — Ambulatory Visit (HOSPITAL_COMMUNITY): Admission: RE | Admit: 2010-08-03 | Discharge: 2010-08-03 | Payer: Self-pay | Admitting: Internal Medicine

## 2010-08-03 LAB — COMPREHENSIVE METABOLIC PANEL
ALT: 17 U/L (ref 0–53)
AST: 19 U/L (ref 0–37)
Albumin: 4.3 g/dL (ref 3.5–5.2)
CO2: 27 mEq/L (ref 19–32)
Calcium: 9.3 mg/dL (ref 8.4–10.5)
Chloride: 106 mEq/L (ref 96–112)
Potassium: 3.6 mEq/L (ref 3.5–5.3)
Sodium: 139 mEq/L (ref 135–145)
Total Protein: 7.4 g/dL (ref 6.0–8.3)

## 2010-08-03 LAB — CBC WITH DIFFERENTIAL/PLATELET
BASO%: 0.5 % (ref 0.0–2.0)
EOS%: 4.4 % (ref 0.0–7.0)
HCT: 44.7 % (ref 38.4–49.9)
MCH: 29.1 pg (ref 27.2–33.4)
MCHC: 33.5 g/dL (ref 32.0–36.0)
MONO#: 0.5 10*3/uL (ref 0.1–0.9)
NEUT%: 70.4 % (ref 39.0–75.0)
RDW: 12.6 % (ref 11.0–14.6)
WBC: 5.9 10*3/uL (ref 4.0–10.3)
lymph#: 0.9 10*3/uL (ref 0.9–3.3)

## 2010-09-06 ENCOUNTER — Encounter: Admission: RE | Admit: 2010-09-06 | Discharge: 2010-09-06 | Payer: Self-pay | Admitting: Internal Medicine

## 2010-10-09 ENCOUNTER — Ambulatory Visit: Payer: Self-pay | Admitting: Thoracic Surgery

## 2010-11-28 ENCOUNTER — Encounter: Payer: Self-pay | Admitting: Internal Medicine

## 2010-12-25 ENCOUNTER — Encounter: Payer: Self-pay | Admitting: Internal Medicine

## 2011-01-04 ENCOUNTER — Ambulatory Visit: Admit: 2011-01-04 | Payer: Self-pay | Admitting: Pulmonary Disease

## 2011-01-15 ENCOUNTER — Telehealth (INDEPENDENT_AMBULATORY_CARE_PROVIDER_SITE_OTHER): Payer: Self-pay | Admitting: *Deleted

## 2011-01-18 ENCOUNTER — Ambulatory Visit: Admit: 2011-01-18 | Payer: Self-pay | Admitting: Pulmonary Disease

## 2011-01-18 ENCOUNTER — Other Ambulatory Visit: Payer: Self-pay | Admitting: Internal Medicine

## 2011-01-18 DIAGNOSIS — C3490 Malignant neoplasm of unspecified part of unspecified bronchus or lung: Secondary | ICD-10-CM

## 2011-01-19 ENCOUNTER — Encounter: Payer: Self-pay | Admitting: Endocrinology

## 2011-01-19 ENCOUNTER — Encounter: Payer: Self-pay | Admitting: Internal Medicine

## 2011-01-20 ENCOUNTER — Encounter: Payer: Self-pay | Admitting: Thoracic Surgery

## 2011-01-20 ENCOUNTER — Encounter: Payer: Self-pay | Admitting: Internal Medicine

## 2011-01-21 ENCOUNTER — Telehealth: Payer: Self-pay | Admitting: Pulmonary Disease

## 2011-01-29 ENCOUNTER — Ambulatory Visit: Admit: 2011-01-29 | Payer: Self-pay | Admitting: Pulmonary Disease

## 2011-01-31 NOTE — Progress Notes (Signed)
  Phone Note Other Incoming   Request: Send information Summary of Call: Request for records received from [facility] + [facility]. Request forwarded to [facility].

## 2011-01-31 NOTE — Progress Notes (Signed)
  Phone Note Other Incoming   Request: Send information Action Taken: Information Sent Summary of Call: Request for records received from [facility]. Request forwarded to [employer].     

## 2011-01-31 NOTE — Letter (Signed)
Summary: *Consult Note  [facility]  [address]   [place], [place] [address]   Phone: [phone]  Fax: [phone]    Re:    Stephen [name] DOB:    02-29-1956   Dear Dr. Alto Denver:    Thank you for requesting that we see the above patient for consultation.  A copy of the detailed office note will be sent under separate cover, for your review.  Evaluation today is consistent with:  Supraspinatus tear (1.3cm width) and possible infraspinatus tear  Our recommendation is for:  Orthopedic surgeon referral for consideration of surgical repair   After today's visit, the patients current medications include: 1)  ALBUTEROL 90 MCG/ACT  AERS (ALBUTEROL) inhale 2 puffs every 4 to 6 hours as needed 2)  * ALBUTEROL NEBULIZER ue as needed 3)  * ALLERGY SHOTS once a week 4)  SYNTHROID 50 MCG  TABS (LEVOTHYROXINE SODIUM) One tablet each day 5)  SYMBICORT 160-4.5 MCG/ACT  AERO (BUDESONIDE-FORMOTEROL FUMARATE) sample x 3 days 6)  XYZAL 5 MG TABS (LEVOCETIRIZINE DIHYDROCHLORIDE) Take 1 tablet by mouth once a day 7)  SPIRIVA HANDIHALER 18 MCG  CAPS (TIOTROPIUM BROMIDE MONOHYDRATE) one puffs in handihaler daily   Thank you for this consultation.  If you have any further questions regarding the care of this patient, please do not hesitate to contact me @ [phone].  Thank you for this opportunity to look after your patient.  Sincerely,   Norton Blizzard MD

## 2011-01-31 NOTE — Consult Note (Signed)
Summary: [facility]  [facility]   Imported By: Marily Memos 02/20/2010 08:45:10  _____________________________________________________________________  External Attachment:    Type:   Image     Comment:   External Document

## 2011-01-31 NOTE — Progress Notes (Signed)
Summary: req to speak to kc  Phone Note Call from Patient Call back at Home Phone [phone]   Caller: Patient Call For: clance Summary of Call: pt requests to speak to kc re: "a few things that they had discussed previously- wants kc's opinion". call home # 1st or cell [phone] Initial call taken by: Tivis Ringer, CNA,  June 04, 2010 9:49 AM  Follow-up for Phone Call        Pt states that at last OV with [facility] he asked the pt if he had ever thought about going on disability. Pt states at that time he said he was not interested, but he has been thinking about it and wants to look further into it. He states he was told he needs to be eval from MD for disability and wants to know is this something [facility] does or should he call PMD for this.  Pt states he wasn't sure where to start. [name] CMA  June 04, 2010 11:17 AM   Additional Follow-up for Phone Call Additional follow up Details #1::        he actually has to apply for disability thru social services office, and then they get all of his doctors records and decides if he meets criteria. Additional Follow-up by: Barbaraann Share MD,  June 04, 2010 5:19 PM    Additional Follow-up for Phone Call Additional follow up Details #2::    Spoke with pt and advised of above recs per [facility].  Pt verbalized understanding. Follow-up by: Vernie Murders,  June 04, 2010 5:28 PM

## 2011-01-31 NOTE — Progress Notes (Signed)
Summary: switch doctors---LMOMTCB  Phone Note Other Incoming   Caller: [facility] [phone] Summary of Call: Dr. Laurey Morale office wanting patient to switch doctors from Dr. Shelle Iron to Dr. Marchelle Gearing, patient also wants this.  Please call [name] back at above # with okay or not. Initial call taken by: Lehman Prom,  January 15, 2011 10:32 AM  Follow-up for Phone Call        [facility], is this okay? Pls advise, thanks! Vernie Murders  January 15, 2011 11:37 AM   Additional Follow-up for Phone Call Additional follow up Details #1::        its ok with me if ok with Dr. Marchelle Gearing.  Additional Follow-up by: Barbaraann Share MD,  January 16, 2011 5:03 PM    Additional Follow-up for Phone Call Additional follow up Details #2::    MR, pls advise if you are okay with seeing this pt, thanks Vernie Murders  January 16, 2011 5:10 PM   Additional Follow-up for Phone Call Additional follow up Details #3:: Details for Additional Follow-up Action Taken: ok with me. please give appointment  Spoke with pt and sched consult with MR for 02/06/11 at 11:40 am Vernie Murders  January 22, 2011 11:03 AM  Additional Follow-up by: Kalman Shan MD,  January 16, 2011 5:19 PM  [facility].Michel Bickers CMA  January 17, 2011 9:00 AM

## 2011-01-31 NOTE — Assessment & Plan Note (Signed)
Summary: rov for emphysema/asthma   Primary Provider/Referring Provider:  Perini  CC:  1 year follow up , pt states he is taking symbicort but does not take spiriva  daily, and albuterol solution expired.  History of Present Illness: the pt comes in today for f/u of his known emphysema with asthmatic component.  He has stayed compliant on his symbicort, but often forgets to take spiriva.  Overall, he feels that he is doing fairly well.  He tells me he got a good report from the [facility], and that his exertional tolerance is at his usual baseline.  He tells me that he has not had a recent acute exacerbation, and currently without cough, chest congestion, or purulent mucus.  Current Medications (verified): 1)  Albuterol 90 Mcg/act  Aers (Albuterol) .... Inhale 2 Puffs Every 4 To 6 Hours As Needed 2)  Albuterol Nebulizer .... Ue As Needed 3)  Allergy Shots .... Once A Week 4)  Synthroid 50 Mcg  Tabs (Levothyroxine Sodium) .... One Tablet Each Day 5)  Symbicort 160-4.5 Mcg/act  Aero (Budesonide-Formoterol Fumarate) .... Sample X 3 Days 6)  Xyzal 5 Mg Tabs (Levocetirizine Dihydrochloride) .... Take 1 Tablet By Mouth Once A Day 7)  Spiriva Handihaler 18 Mcg  Caps (Tiotropium Bromide Monohydrate) .... One Puffs in Handihaler Daily 8)  Aspirin 81 Mg Tabs (Aspirin) .Marland Kitchen.. 1 Once Daily  Allergies (verified): 1)  ! Codeine  Review of Systems      See HPI  Vital Signs:  Patient profile:   55 year old male Height:      69 inches Weight:      200.2 pounds BMI:     29.67 O2 Sat:      98 % on Room air Temp:     97.9 degrees F oral Pulse rate:   70 / minute BP sitting:   130 / 80  (right arm)  Vitals Entered By: Renold Genta RCP, LPN (April 13, 2010 9:18 AM)  O2 Sat at Rest %:  98% O2 Flow:  Room air CC: 1 year follow up , pt states he is taking symbicort but does not take spiriva  daily, albuterol solution expired Comments Medications reviewed with patient Renold Genta RCP, LPN   April 13, 2010 9:22 AM    Physical Exam  General:  wd male in nad Lungs:  clear to auscultation Heart:  rrr Extremities:  no edema or cyanosis   Impression & Recommendations:  Problem # 1:  EMPHYSEMA (ICD-492.8)  with asthmatic component.  Overall, the pt is doing well on his current regimen.  He needs a new supply of albuterol, so will send an order to [facility] for this.  I have asked him to stay active, and to let us know if worsening breathing issues.  I have also asked him to work on improved compliance with spiriva  Medications Added to Medication List This Visit: 1)  Aspirin 81 Mg Tabs (Aspirin) .Marland Kitchen.. 1 once daily  Other Orders: Est. Patient Level II ([address]) DME Referral (DME)  Patient Instructions: 1)  stay on current meds. 2)  will send an order to [facility] for new albuterol solution  3)  followup with me in one year, or sooner if problems.  Prescriptions: SYMBICORT 160-4.5 MCG/ACT  AERO (BUDESONIDE-FORMOTEROL FUMARATE) sample x 3 days  #1 x 6   Entered and Authorized by:   Barbaraann Share MD   Signed by:   Barbaraann Share MD on 04/13/2010   Method used:  Print then Give to Patient   RxID:   [id number]

## 2011-01-31 NOTE — Assessment & Plan Note (Signed)
Summary: WC,L SHOULDER INJURY,MC   Vital Signs:  Patient profile:   55 year old male Height:      69 inches Weight:      194 pounds BMI:     28.75 BP sitting:   108 / 74  Vitals Entered By: Enid Baas MD (February 15, 2010 10:31 AM)  Primary Provider:  Waynard Edwards   History of Present Illness: 55 yo M sent as referral by Dr. Alto Denver for L shoulder pain  Patient is left handed On 12/15 was pulling a cord 4 times trying to start a generator and developed left lateral shoulder pain Dx with shoulder strain, has done PT and used NSAIDs and working through this Returned to Dr Alto Denver with persistent issues 2/8 - referred to [place] for RTC syndrome. + night pain Worse with overhead and external rotation. States he has had a shot in the shoulder that lasted 3 days but I cannot find this in his records. Has h/o RTC tear on right shoulder in past.  Allergies (verified): 1)  ! Codeine  Social History: Reviewed history from 04/17/2009 and no changes required. Patient states former smoker.  Hx of smoking 1 ppd x 30 years.  Quit 2006. Pt is single.  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    Physical Exam  General:  NAD Msk:  L Shoulder: Inspection reveals no abnormalities, atrophy or asymmetry. Palpation is normal with no tenderness over AC joint or bicipital groove. ROM is full in all planes but painful at extents of abduction and flexion. Rotator cuff strength mildly decreased with resisted empty can - normal resisted ER/IR. + Hawkins and empty can.  + painful arc. Normal scapular function observed. No drop arm sign. No apprehension sign Additional Exam:  MSK u/s L shoulder: Biceps tendon intact and normal appearance.  AC joint normal without DJD but geyser sign.  Evidence of subacromial impingement but not coracoid impingement.   ~1.3cm width tear in suprapinatus with hypoechoic area but does not appear to be full thickness.  Partial tear also within  infraspinatus.  Images saved for documentation.   Impression & Recommendations:  Problem # 1:  ROTATOR CUFF TEAR (ICD-727.61) Assessment New Fairly large supraspinatus tear likely the cause of patient's pain.  Does not appear to be significantly retracted or a lot of muscle atrophy compared to the right (mild atrophy bialteral supraspinatus fossae on exam).  Did not respond to a cortisone injection and PT.  Would recommend referral to orthopedic surgeon for consideration of arthroscopy and repair of rotator cuff tear.  Orders: US EXTREMITY NON-VASC REAL-TIME IMG (11914)  Complete Medication List: 1)  Albuterol 90 Mcg/act Aers (Albuterol) .... Inhale 2 puffs every 4 to 6 hours as needed 2)  Albuterol Nebulizer  .... Ue as needed 3)  Allergy Shots  .... Once a week 4)  Synthroid 50 Mcg Tabs (Levothyroxine sodium) .... One tablet each day 5)  Symbicort 160-4.5 Mcg/act Aero (Budesonide-formoterol fumarate) .... Sample x 3 days 6)  Xyzal 5 Mg Tabs (Levocetirizine dihydrochloride) .... Take 1 tablet by mouth once a day 7)  Spiriva Handihaler 18 Mcg Caps (Tiotropium bromide monohydrate) .... One puffs in handihaler daily  Patient Instructions: 1)  You have a rotator cuff tear - we will send a note to (and call) Dr. Alto Denver to let her know about this and we would recommend you see a surgeon about possibly fixing this. 2)  Follow up with [place] as needed.

## 2011-01-31 NOTE — Progress Notes (Signed)
Summary: needs info on rx  Phone Note From Pharmacy Call back at [phone]   Caller: Jonny Ruiz from [facility] Call For: clance  Summary of Call: was given a rx for albuterol. it was not listed any kind of information on it. there is no refills, how he wants him to take it or nothing. Jonny Ruiz needs to talk to someone regarding this medicine.  Initial call taken by: Valinda Hoar,  April 13, 2010 12:46 PM  Follow-up for Phone Call        Almyra Free, isn't this the pt you were asking [facility] about? [name] CMA  April 13, 2010 1:52 PM   Additional Follow-up for Phone Call Additional follow up Details #1::        spoke to [email or web address] gave him his rx for albuterol 2.5mg  qid as needed #120 with as needed refills Additional Follow-up by: Oneita Jolly,  April 13, 2010 2:58 PM

## 2011-01-31 NOTE — Letter (Signed)
Summary: ORTHOPEDIC SURGEON REFER  ORTHOPEDIC SURGEON REFER   Imported By: Shelbie Proctor 02/16/2010 15:13:25  _____________________________________________________________________  External Attachment:    Type:   Image     Comment:   External Document

## 2011-01-31 NOTE — Progress Notes (Signed)
Summary: nos appt  Phone Note Call from Patient   Caller: [email or web address]  Call For: clance Summary of Call: Rsc nos from 1/20 to 1/31. Initial call taken by: Darletta Moll,  January 21, 2011 9:59 AM

## 2011-02-01 ENCOUNTER — Ambulatory Visit: Payer: 59 | Admitting: Internal Medicine

## 2011-02-01 ENCOUNTER — Ambulatory Visit (HOSPITAL_COMMUNITY)
Admission: RE | Admit: 2011-02-01 | Discharge: 2011-02-01 | Disposition: A | Discharge status: Home / Self Care | Payer: 59 | Source: Ambulatory Visit | Attending: Internal Medicine | Admitting: Internal Medicine

## 2011-02-01 DIAGNOSIS — C3410 Malignant neoplasm of upper lobe, unspecified bronchus or lung: Secondary | ICD-10-CM

## 2011-02-01 DIAGNOSIS — Z9221 Personal history of antineoplastic chemotherapy: Secondary | ICD-10-CM | POA: Insufficient documentation

## 2011-02-01 DIAGNOSIS — C3490 Malignant neoplasm of unspecified part of unspecified bronchus or lung: Secondary | ICD-10-CM

## 2011-02-01 DIAGNOSIS — Z8589 Personal history of malignant neoplasm of other organs and systems: Secondary | ICD-10-CM | POA: Insufficient documentation

## 2011-02-01 DIAGNOSIS — Z85118 Personal history of other malignant neoplasm of bronchus and lung: Secondary | ICD-10-CM | POA: Insufficient documentation

## 2011-02-01 DIAGNOSIS — J984 Other disorders of lung: Secondary | ICD-10-CM | POA: Insufficient documentation

## 2011-02-01 DIAGNOSIS — Z923 Personal history of irradiation: Secondary | ICD-10-CM | POA: Insufficient documentation

## 2011-02-01 LAB — CMP (CANCER CENTER ONLY)
ALT(SGPT): 34 U/L (ref 10–47)
AST: 26 U/L (ref 11–38)
Albumin: 3.4 g/dL (ref 3.3–5.5)
BUN, Bld: 13 mg/dL (ref 7–22)
CO2: 30 mEq/L (ref 18–33)
Calcium: 8.9 mg/dL (ref 8.0–10.3)
Chloride: 101 mEq/L (ref 98–108)
Creat: 1 mg/dl (ref 0.6–1.2)
Potassium: 4.1 mEq/L (ref 3.3–4.7)

## 2011-02-01 LAB — CBC WITH DIFFERENTIAL/PLATELET
BASO%: 0.8 % (ref 0.0–2.0)
Basophils Absolute: 0.1 10*3/uL (ref 0.0–0.1)
HCT: 42.9 % (ref 38.4–49.9)
HGB: 14.7 g/dL (ref 13.0–17.1)
MONO#: 0.7 10*3/uL (ref 0.1–0.9)
NEUT#: 4.7 10*3/uL (ref 1.5–6.5)
NEUT%: 73.3 % (ref 39.0–75.0)
RDW: 13.6 % (ref 11.0–14.6)
WBC: 6.4 10*3/uL (ref 4.0–10.3)
lymph#: 0.8 10*3/uL — ABNORMAL LOW (ref 0.9–3.3)

## 2011-02-01 MED ORDER — IOHEXOL 300 MG/ML  SOLN: 80.0000 mL | Freq: Once | INTRAMUSCULAR | Status: AC | PRN

## 2011-02-05 ENCOUNTER — Encounter (HOSPITAL_BASED_OUTPATIENT_CLINIC_OR_DEPARTMENT_OTHER): Payer: 59 | Admitting: Internal Medicine

## 2011-02-05 ENCOUNTER — Other Ambulatory Visit: Payer: Self-pay | Admitting: Internal Medicine

## 2011-02-05 DIAGNOSIS — C3410 Malignant neoplasm of upper lobe, unspecified bronchus or lung: Secondary | ICD-10-CM

## 2011-02-05 DIAGNOSIS — C320 Malignant neoplasm of glottis: Secondary | ICD-10-CM

## 2011-02-05 DIAGNOSIS — C3490 Malignant neoplasm of unspecified part of unspecified bronchus or lung: Secondary | ICD-10-CM

## 2011-02-05 DIAGNOSIS — Z8521 Personal history of malignant neoplasm of larynx: Secondary | ICD-10-CM

## 2011-02-06 ENCOUNTER — Encounter: Payer: Self-pay | Admitting: Internal Medicine

## 2011-02-06 ENCOUNTER — Institutional Professional Consult (permissible substitution) (INDEPENDENT_AMBULATORY_CARE_PROVIDER_SITE_OTHER): Payer: 59 | Admitting: Internal Medicine

## 2011-02-06 DIAGNOSIS — J449 Chronic obstructive pulmonary disease, unspecified: Secondary | ICD-10-CM | POA: Insufficient documentation

## 2011-02-14 NOTE — Assessment & Plan Note (Signed)
Summary: emphysema/lmr   Visit Type:  Follow-up Primary Provider/Referring Provider:  Waynard Edwards - PMD, Pulmonary - Dr Marchelle Gearing (Dr Shelle Iron), Allergy - Dr. Friendly Callas, ENT  - Dr Jenne Pane  CC:  Pt switching from [facility] to MR.Marland Kitchen  History of Present Illness: February 06, 2011: FU GOld stage 2 copd as of 2008. Quit smoking 2006 following rt lung cancer resection. Ex-throat cancer.  . Was being followed by Dr. Shelle Iron for COPD but now switched to me. No acut issues. Dealing wiht chronic sinus issues till Nov 2011 - Dr Waynard Edwards and Dr Latimer Callas handling it. No change in baseline class 2 dyspnea (mailbox and back) and chronic mild-mod cough. Compliant with mdi. Denies chest pain, edema, sputum. 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 Strong family hx present for COPD.  He wants to know Rx options.   Of note, surveillance CT feb 2012 for lung cancer shows31mm RLL nodule. DR Shirline Frees has informed of need for surveillance which I support.   Preventive Screening-Counseling & Management  Alcohol-Tobacco     Smoking Status: quit     Packs/Day: 1.0     Year Started: 1970     Year Quit: 2006  Current Medications (verified): 1)  Albuterol 90 Mcg/act  Aers (Albuterol) .... Inhale 2 Puffs Every 4 To 6 Hours As Needed 2)  Albuterol Nebulizer .... Ue As Needed 3)  Allergy Shots .... Once A Week 4)  Synthroid 100 Mcg Tabs (Levothyroxine Sodium) .... Take 1 Tablet By Mouth Once A Day 5)  Symbicort 160-4.5 Mcg/act  Aero (Budesonide-Formoterol Fumarate) .... Sample X 3 Days 6)  Spiriva Handihaler 18 Mcg  Caps (Tiotropium Bromide Monohydrate) .... One Puffs in Handihaler Daily 7)  Aspirin 81 Mg Tabs (Aspirin) .Marland Kitchen.. 1 Once Daily 8)  Vitamin D3 5000 Unit Tabs (Cholecalciferol) .... Take 1 Tablet By Mouth Once A Day 9)  Fosamax 40 Mg Tabs (Alendronate Sodium) .... Once Weekly  Allergies (verified): 1)  ! Codeine 2)  ! * Singulair  Past History:  Past medical, surgical, family and social histories  (including risk factors) reviewed, and no changes noted (except as noted below).  Past Medical History:   #vocal cord cancer.Marland KitchenMarland KitchenDr Jenne Pane #Lung Cancer.Marland KitchenMarland KitchenMarland KitchenDr  Mohammed/Dr Edwyna Shell  - Rt side - resection 2006 by Dr Edwyna Shell  - surveillance CT 02/01/2011 - new 4mm RLL nodule #ALLERGIC RHINITIS (ICD-477.9)...Marland KitchenDr [place] Callas   - allergic to dust, pollen, mites, mold, mildew  - Rx allergy shots #EMPHYSEMA (ICD-492.8) and ASTHMA (ICD-493.90)  - 2008 PFT fev1 1.96L/53%, DLCO 18/68%. 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  - due to above. Class 2. Mailbx and back #Osteporosis  #Vit D Def   - on50K unit per week, started fall 2011   Alpha 1 -M1Z  Past Surgical History: Appendectomy RUL removed-2006 Right shoulder surgery Laser surgery on throat for cancer left knee surgery-bolts after fracture-1072 Left shoulder rotator cuff  Family History: Reviewed history from 04/17/2009 and no changes required. allergies: mother asthma: mother cancer: mother (brain tumor)  sister: deceased cystic fibosis COPD: mom (died), cousin Willeen Cass,   Social History: Reviewed history from 04/17/2009 and no changes required. Patient states former smoker.  Hx of smoking 1 ppd x 30 years.  Quit 2006 at time of right lung cncer surgery Pt is single.  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  The patient denies shortness of breath at rest, coughing up blood, chest pain, irregular heartbeats, indigestion, loss of appetite, weight change, abdominal pain, difficulty swallowing, sore throat, tooth/dental problems, headaches, nasal congestion/difficulty  breathing through nose, itching, ear ache, anxiety, hand/feet swelling, joint stiffness or pain, rash, change in color of mucus, and fever.         stable positive ROS  Vital Signs:  Patient profile:   55 year old male Height:      69 inches Weight:      200.50 pounds BMI:     29.72 O2 Sat:      93 % on Room air Temp:     97.9 degrees F oral Pulse rate:   69 / minute BP sitting:   130 / 86  (right arm) Cuff size:   regular  Vitals Entered By: [name] CMA (February 06, 2011 11:56 AM)  O2 Flow:  Room air CC: Pt switching from [facility] to MR. Comments Medications reviewed with patient [name] CMA  February 06, 2011 11:56 AM Daytime phone number verified with patient.    Physical Exam  General:  wd male in nad Head:  normocephalic and atraumatic Ears:  TMs intact and clear with normal canals Mouth:  no deformity or lesions Neck:  no masses, thyromegaly, or abnormal cervical nodes Chest Wall:  rt chest scar + Lungs:  clear to auscultationdecreased BS bilateral.    Heart:  rrrregular rhythm, normal rate, no murmurs, no rubs, and no gallops.   Extremities:  no edema or cyanosis   CT of Chest  Procedure date:  02/01/2011  Findings:      Of note, surveillance CT feb 2012 for lung cancer shows54mm RLL nodule. DR Shirline Frees has informed of need for surveillance which I support.   Comments:      independently reviewed  Impression & Recommendations:  Problem # 1:  C O P D (ICD-496) Assessment Unchanged stable diseae clinically, MZ phenotype. MZ disease is typically not an indication for alpha 1 replacement unless serial PFTs in the absence of smoking (which patient is) shows decline beyond what could be acccounted for age. Even then it is matter of expert opinion.  I  have counselled him as such. We will repeat PFTs. We will get spirometries done at Dr Lyla Son office and then regroup to see if he has significant decline. At followup, I will recommen pulmonary  rehab for him. He is to continue spiriva and symbicort wihtout change  Medications Added to Medication List This Visit: 1)  Synthroid 100 Mcg Tabs (Levothyroxine sodium) .... Take 1 tablet by mouth once a day 2)  Vitamin D3 5000 Unit Tabs (Cholecalciferol) .... Take 1 tablet by mouth once a day 3)  Fosamax 40 Mg Tabs (Alendronate sodium) .... Once weekly  Other Orders: Pulmonary Referral (Pulmonary) Est. Patient Level III ([address])  Patient Instructions: 1)  please have full PFTs and return for followup 2)  sign release to get all brething test reports from Dr. Coralie Keens   Immunization History:  Influenza Immunization History:    Influenza:  historical (10/01/2010)  Pneumovax Immunization History:    Pneumovax:  historical (10/30/2009)

## 2011-02-22 ENCOUNTER — Encounter: Payer: Self-pay | Admitting: Internal Medicine

## 2011-02-22 ENCOUNTER — Encounter (INDEPENDENT_AMBULATORY_CARE_PROVIDER_SITE_OTHER): Payer: 59

## 2011-02-22 ENCOUNTER — Encounter (INDEPENDENT_AMBULATORY_CARE_PROVIDER_SITE_OTHER): Payer: Self-pay | Admitting: *Deleted

## 2011-02-22 ENCOUNTER — Ambulatory Visit (INDEPENDENT_AMBULATORY_CARE_PROVIDER_SITE_OTHER): Payer: 59 | Admitting: Internal Medicine

## 2011-02-22 DIAGNOSIS — J45909 Unspecified asthma, uncomplicated: Secondary | ICD-10-CM

## 2011-02-22 DIAGNOSIS — M549 Dorsalgia, unspecified: Secondary | ICD-10-CM

## 2011-02-22 DIAGNOSIS — J449 Chronic obstructive pulmonary disease, unspecified: Secondary | ICD-10-CM

## 2011-02-22 DIAGNOSIS — J4489 Other specified chronic obstructive pulmonary disease: Secondary | ICD-10-CM

## 2011-02-22 DIAGNOSIS — J438 Other emphysema: Secondary | ICD-10-CM

## 2011-02-26 NOTE — Assessment & Plan Note (Signed)
Summary: pft w office visit//jd   Allergies: 1)  ! Codeine 2)  ! * Singulair   Other Orders: Carbon Monoxide diffusing w/capacity (39030) Lung Volumes/Gas dilution or washout (09233) Spirometry (Pre & Post) [phone])

## 2011-02-26 NOTE — Miscellaneous (Addendum)
Summary: update med  Clinical Lists Changes  Medications: Changed medication from VITAMIN D3 5000 UNIT TABS (CHOLECALCIFEROL) Take 1 tablet by mouth once a day to VITAMIN D3 5000 UNIT TABS (CHOLECALCIFEROL) Take 1 by mouth every week

## 2011-02-26 NOTE — Assessment & Plan Note (Addendum)
Summary: rov after pft//jd   Visit Type:  Follow-up Primary Provider/Referring Provider:  Waynard Edwards - PMD, Pulmonary - Dr Marchelle Gearing (Dr Shelle Iron), Allergy - Dr. [place] Callas, ENT  - Dr Jenne Pane  CC:  Pt here to discuss PFT results. .  History of Present Illness: February 06, 2011: FU GOld stage 2 copd as of 2008. Quit smoking 2006 following rt lung cancer resection. Ex-throat cancer.  . Was being followed by Dr. Shelle Iron for COPD but now switched to me. No acut issues. Dealing wiht chronic sinus issues till Nov 2011 - Dr Waynard Edwards and Dr [place] Callas handling it. No change in baseline class 2 dyspnea (mailbox and back) and chronic mild-mod cough. Compliant with mdi. Denies chest pain, edema, sputum. Had Alpha 1 check 12/25/2010 at [facility] - diagnosed as MZ . Strong family hx present for COPD.  He wants to know Rx options.  Of note, surveillance CT feb 2012 for lung cancer shows30mm RLL nodule. DR Shirline Frees has informed of need for surveillance which I support.    February 22, 2011: followup after PFTs. Only interim complaint is mild exacerbation  of old low mid back pain. Denies edema, worsening dyspnea, increased sedentary life style, fever, chills, sputum. PFTs today show Fev1 1.97L/57%, DLCO 78% this is unchanged from June 2009. No other complaints.    Preventive Screening-Counseling & Management  Alcohol-Tobacco     Smoking Status: quit     Packs/Day: 1.0     Year Started: 1970     Year Quit: 2006     Pack years: l ppd x 30  Current Medications (verified): 1)  Albuterol 90 Mcg/act  Aers (Albuterol) .... Inhale 2 Puffs Every 4 To 6 Hours As Needed 2)  Albuterol Sulfate (2.5 Mg/19ml) 0.083% Nebu (Albuterol Sulfate) .... As Needed 3)  Allergy Shots .... Once A Week 4)  Synthroid 100 Mcg Tabs (Levothyroxine Sodium) .... Take 1 Tablet By Mouth Once A Day 5)  Symbicort 160-4.5 Mcg/act  Aero (Budesonide-Formoterol Fumarate) .... Sample X 3 Days 6)  Spiriva Handihaler 18 Mcg  Caps (Tiotropium Bromide  Monohydrate) .... One Puffs in Handihaler Daily 7)  Aspirin 81 Mg Tabs (Aspirin) .Marland Kitchen.. 1 Once Daily 8)  Vitamin D3 5000 Unit Tabs (Cholecalciferol) .... Take 1 Tablet By Mouth Once A Day 9)  Fosamax 40 Mg Tabs (Alendronate Sodium) .... Once Weekly  Allergies (verified): 1)  ! Codeine 2)  ! * Singulair  Past History:  Past medical, surgical, family and social histories (including risk factors) reviewed, and no changes noted (except as noted below).  Past Medical History:   #vocal cord cancer.Marland KitchenMarland KitchenDr Jenne Pane #Lung Cancer.Marland KitchenMarland KitchenMarland KitchenDr  Mohammed/Dr Edwyna Shell  - Rt side - resection 2006 by Dr Edwyna Shell  - surveillance CT 02/01/2011 - new 4mm RLL nodule #ALLERGIC RHINITIS (ICD-477.9)...Marland KitchenDr [place] Callas   - allergic to dust, pollen, mites, mold, mildew  - Rx allergy shots #EMPHYSEMA (ICD-492.8) and ASTHMA (ICD-493.90)  - 2008 PFT fev1 1.96L/53%, DLCO 18/68%. On spiriva and symbicort  - 2012 PFT fev1 1.97L/57%, DLCO 78% #ALPHA 1 MZ  - Alpha 1 12/25/2010 - M1Z wiht strong family hx. Level normal #Dyspnea with chronic cough.  - due to above. Class 2. Mailbx and back #Osteporosis  #Vit D Def   - on50K unit per week, started fall 2011   Alpha 1 -M1Z  Past Surgical History: Reviewed history from 02/06/2011 and no changes required. Appendectomy RUL removed-2006 Right shoulder surgery Laser surgery on throat for cancer left knee surgery-bolts after fracture-1072 Left shoulder rotator cuff  Family History: Reviewed history from 02/06/2011 and no changes required. allergies: mother asthma: mother cancer: mother (brain tumor)  sister: deceased cystic fibosis COPD: mom (died), cousin Willeen Cass,   Social History: Reviewed history from 02/06/2011 and no changes required. Patient states former smoker.  Hx of smoking 1 ppd x 30 years.  Quit 2006 at time of right lung cncer surgery Pt is single.  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  The patient denies anorexia, fever, weight loss, weight gain, vision loss, decreased hearing, hoarseness, chest pain, syncope, peripheral edema, prolonged cough, headaches, hemoptysis, abdominal pain, melena, hematochezia, severe indigestion/heartburn, hematuria, incontinence, genital sores, muscle weakness, suspicious skin lesions, transient blindness, difficulty walking, depression, unusual weight change, abnormal bleeding, enlarged lymph nodes, angioedema, breast masses, and testicular masses.         back pain  Vital Signs:  Patient profile:   55 year old male Height:      69 inches O2 Sat:      96 % on Room air Temp:     97.5 degrees F oral Pulse rate:   68 / minute BP sitting:   118 / 72  (right arm) Cuff size:   regular  Vitals Entered By: [name] CMA (February 22, 2011 2:40 PM)  O2 Flow:  Room air CC: Pt here to discuss PFT results.  Comments Medications reviewed with patient [name] CMA  February 22, 2011 2:45 PM Daytime phone number verified with patient.    Physical Exam  General:  wd male in nad Head:  normocephalic and atraumatic Ears:  TMs intact and clear with normal canals Mouth:  no deformity or lesions Neck:  no masses, thyromegaly, or abnormal cervical nodes Chest Wall:  rt chest scar + Lungs:  clear to auscultationdecreased BS bilateral.    Heart:  rrrregular rhythm, normal rate, no murmurs, no rubs, and no gallops.   Extremities:  no edema or cyanosis   MISC. Report  Procedure date:  02/22/2011  Findings:      Gold stage 2 copd. Unchanged from 2008  Comments:      independently reviewed trace  Impression & Recommendations:  Problem # 1:  C O P D (ICD-496) Assessment Unchanged PFts show unchanged Fev1 since 2008. Alpha 1 is MZ and levels are normal. He does not meet critieria for alpha 1  replacement. However, will need serial mnitoring of PFTs once a year - if this shows accelerated worsening will consider alpha 1 replacement. For now, he needs pulmonary rehab and he is agreeable to this. I have made the referral. Continue spiriva and symbicort. ROV 4 months  Problem # 2:  BACK PAIN (ICD-724.5) Assessment: New  sounds musculoskeletal worsening of an old issue  plan as needed pain meds at his disposal if worse go to ER  Orders: Est. Patient Level III (04540)  Medications Added to Medication List This Visit: 1)  Albuterol Sulfate (2.5 Mg/100ml) 0.083% Nebu (Albuterol sulfate) .... As needed  Other Orders: Rehabilitation Referral (Rehab)  Patient Instructions: 1)  please attend pulmonary rehab 2)  you do not need alpha  1 replacement butr will check your breathing tests once a year 3)  conitnue spiriva and symbicort 4)  return in 4 months  5)  monitor your back pain -if worse go to er  Appended Document: rov after pft//jd i need to call Dr. Andris Flurry  Appended Document: rov after pft//jd Dr Waynard Edwards just called me. I explained my rationale to follow and not recommend alpha 1 replacement. He is ok with plan. Wants [place] more involved with [facility] Rx  - I said I will

## 2011-04-12 ENCOUNTER — Ambulatory Visit: Payer: Self-pay | Admitting: Pulmonary Disease

## 2011-05-02 ENCOUNTER — Ambulatory Visit (HOSPITAL_COMMUNITY)
Admission: RE | Admit: 2011-05-02 | Discharge: 2011-05-02 | Disposition: A | Discharge status: Home / Self Care | Payer: 59 | Source: Ambulatory Visit | Attending: Internal Medicine | Admitting: Internal Medicine

## 2011-05-02 ENCOUNTER — Encounter (HOSPITAL_BASED_OUTPATIENT_CLINIC_OR_DEPARTMENT_OTHER): Payer: 59 | Admitting: Internal Medicine

## 2011-05-02 ENCOUNTER — Other Ambulatory Visit: Payer: Self-pay | Admitting: Internal Medicine

## 2011-05-02 ENCOUNTER — Encounter (HOSPITAL_COMMUNITY): Payer: Self-pay

## 2011-05-02 DIAGNOSIS — R0602 Shortness of breath: Secondary | ICD-10-CM | POA: Insufficient documentation

## 2011-05-02 DIAGNOSIS — C320 Malignant neoplasm of glottis: Secondary | ICD-10-CM

## 2011-05-02 DIAGNOSIS — C3490 Malignant neoplasm of unspecified part of unspecified bronchus or lung: Secondary | ICD-10-CM

## 2011-05-02 DIAGNOSIS — R079 Chest pain, unspecified: Secondary | ICD-10-CM | POA: Insufficient documentation

## 2011-05-02 DIAGNOSIS — M949 Disorder of cartilage, unspecified: Secondary | ICD-10-CM | POA: Insufficient documentation

## 2011-05-02 DIAGNOSIS — M899 Disorder of bone, unspecified: Secondary | ICD-10-CM | POA: Insufficient documentation

## 2011-05-02 DIAGNOSIS — C140 Malignant neoplasm of pharynx, unspecified: Secondary | ICD-10-CM | POA: Insufficient documentation

## 2011-05-02 DIAGNOSIS — J984 Other disorders of lung: Secondary | ICD-10-CM | POA: Insufficient documentation

## 2011-05-02 HISTORY — DX: Malignant neoplasm of pharynx, unspecified: C14.0

## 2011-05-02 HISTORY — DX: Malignant neoplasm of unspecified part of unspecified bronchus or lung: C34.90

## 2011-05-02 LAB — CMP (CANCER CENTER ONLY)
AST: 25 U/L (ref 11–38)
Alkaline Phosphatase: 103 U/L — ABNORMAL HIGH (ref 26–84)
BUN, Bld: 13 mg/dL (ref 7–22)
Creat: 0.9 mg/dl (ref 0.6–1.2)
Total Bilirubin: 0.7 mg/dl (ref 0.20–1.60)

## 2011-05-02 LAB — CBC WITH DIFFERENTIAL/PLATELET
Basophils Absolute: 0.1 10*3/uL (ref 0.0–0.1)
EOS%: 3.6 % (ref 0.0–7.0)
HCT: 43.3 % (ref 38.4–49.9)
HGB: 14.8 g/dL (ref 13.0–17.1)
MCH: 29.1 pg (ref 27.2–33.4)
MCHC: 34.3 g/dL (ref 32.0–36.0)
MCV: 85 fL (ref 79.3–98.0)
MONO%: 9.5 % (ref 0.0–14.0)
NEUT%: 71.4 % (ref 39.0–75.0)

## 2011-05-02 MED ORDER — IOHEXOL 300 MG/ML  SOLN
80.0000 mL | Freq: Once | INTRAMUSCULAR | Status: AC | PRN
  Administered 2011-05-02: 80 mL via INTRAVENOUS

## 2011-05-07 ENCOUNTER — Encounter (HOSPITAL_BASED_OUTPATIENT_CLINIC_OR_DEPARTMENT_OTHER): Payer: 59 | Admitting: Internal Medicine

## 2011-05-07 DIAGNOSIS — C3410 Malignant neoplasm of upper lobe, unspecified bronchus or lung: Secondary | ICD-10-CM

## 2011-05-07 DIAGNOSIS — Z8521 Personal history of malignant neoplasm of larynx: Secondary | ICD-10-CM

## 2011-05-07 DIAGNOSIS — C320 Malignant neoplasm of glottis: Secondary | ICD-10-CM

## 2011-05-14 NOTE — Assessment & Plan Note (Signed)
[facility]                             PULMONARY OFFICE NOTE   BRITTEN, SEYFRIED                   MRN:          [id number]  DATE:05/20/2007                            DOB:          1956/03/19    HISTORY OF PRESENT ILLNESS:  The patient is a 55 year old gentleman whom  I have been asked to see for chronic pulmonary disease.  The patient has  very extensive pulmonary history with stage I lung cancer diagnosed in  2006 where he underwent right upper lobectomy by Dr. Dewayne Shorter and did  not receive chemotherapy or radiation at that time.  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 Jenne Pane, and had laser surgery, but did not receive radiation.  He had  a clean exam in March of this year.  The patient had a recent  hospitalization beginning of April for pneumonia and asthma  exacerbation.  At that time, he was coughing with worsening shortness of  breath and also purulent mucus.  Since that time, he describes recurrent  episodes of sinobronchitis and has been on 4 rounds of prednisone as  well as antibiotics.  He is unsure if he really did that much better on  the prednisone.  He was also treated with Doxycycline recently.  The  patient does have a history of allergies and takes shots for this.  He  does have a long history of recurrent sinobronchitis in the past.  Currently, he has very little cough and mucus, and no purulence.  He  does have fairly significant dyspnea on exertion at 1 to 2 blocks at a  moderate pace.  He does feel some restriction to air flow.  He denies  any sinusitis symptoms currently.  The patient denies symptoms  consistent with frank gastroesophageal reflux disease,  but does have  some issues 2 to 3 times a week for which he takes Rolaids.  He has been  given AcipHex, but does not take this on a regular basis.   PAST MEDICAL HISTORY:  1. Significant for his lung cancer and vocal cord cancer, as stated      above.  2. History of chronic allergic rhinitis.  3. History of asthma.  4. Status post appendectomy.  5. History of multiple orthopedic procedures.   CURRENT MEDICATIONS:  1. Singulair 10 mg daily.  2. Advair 250/50 one b.i.d.  3. Allergy shots q. week.  4. Albuterol metered dose inhaler and hand held nebulizer for rescue.   The patient has possible intolerance to CODEINE.   SOCIAL HISTORY:  He has a history of smoking 1 pack per day for 30  years.  He has not smoked since 2006.  He is single.  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   FAMILY HISTORY:  Remarkable for his mother having allergy and asthma, as  well as a brain tumor.   REVIEW OF SYSTEMS:  As per history of present illness.  Also see patient  intake form, documented in the chart.   PHYSICAL EXAM:  GENERAL:  He is a well-developed male in no acute  distress.  Blood pressure 118/68, pulse 103, temperature 97.7, weight is 186  pounds, O2 saturation on room air is 94%.  HEENT:  Pupils are equal, round, and reactive to light and  accommodation.  Extraocular muscles are intact.  Nares show mild  inflammatory changes, but no purulence or bleeding.  Oropharynx is  clear.  NECK:  Supple without JVD or lymphadenopathy.  There is no palpable  thyromegaly.  CHEST:  Faint basalar crackles.  Otherwise, is clear.  There is no  wheezing.  CARDIAC:  Regular rate and rhythm.  No murmurs, rubs, or gallops.  ABDOMEN:  Soft and nontender with good bowel sounds.  GENITAL, RECTAL, AND BREASTS:  Not done and not indicated.  LOWER EXTREMITIES:  Without edema.  Pulses intact distally.  NEUROLOGIC:  He is alert and oriented with no obvious motor  deficits.   LABORATORY DATA:  Chest CT was reviewed and showed no significant  lymphadenopathy, however, there were some patchy ground-glass densities  in the right mid lung zone.  Unknown age.  He does have changes of  emphysema.   IMPRESSION:  1. History of asthma with recurrent exacerbations in a patient with a      history of chronic and recurrent episodes of sinobronchitis.  It is      unclear how much emphysema he may or may not have superimposed on      top of this.  He is on an excellent medication regimen for asthma      and continues to have symptoms.  I would wonder if he has chronic      sinusitis, not completely controlled allergic rhinitis, or whether      laryngopharyngeal reflux is playing a role here.  At this point in      time, I would like to intensify his Advair treatment to the higher      strength, get a copy of his PFTs to get some idea how much this may      be fixed obstruction superimposed over reversible obstruction, and      also exclude the possibility of chronic sinusitis.  I also think he      would benefit from aggressive treatment of presumed      laryngopharyngeal reflux.  2. Ground-glass densities on chest CT from April of 2008.  This could      potentially be residual from his pneumonia when he was      hospitalized, but I would also keep the possible diagnosis of      chronic radiation pneumonitis in mind.  This would typically be      somewhat steroid-responsive and I am not overly impressed with      significant improvement while he has been on steroids.   PLAN:  1. I have asked the patient to start AcipHex b.i.d.  2. Will obtain pulmonary function studies from Dr. Laurey Morale office.  3. Increase Advair to 500/50.  4. Will do a limited CT of the sinuses to rule out chronic sinusitis.  5. The patient will follow back for the above.     Barbaraann Share, MD,FCCP  Electronically Signed   [facility]/Med[facility]  DD: 06/02/2007  DT: 06/02/2007  Job #:  [id number]   cc:   Loraine Leriche A. Perini, M.D.

## 2011-05-14 NOTE — Assessment & Plan Note (Signed)
[facility]                             PULMONARY OFFICE NOTE   SAWYER, MENTZER                   MRN:          [id number]  DATE:10/19/2007                            DOB:          February 14, 1956    SUBJECTIVE:  Mr. Gesner comes in for followup of his emphysema and  asthma.  The patient underwent pulmonary function studies between the  last visit and now on June 24, 2007 where he was found to have very  severe airflow obstruction with an FEV1 percent of 42.  He had no  restriction, surprisingly, and a mild reduction in his diffusion  capacity.  He had no response to bronchodilators.  The patient has been  maintained on his Advair at the higher strength, but, unfortunately, has  not been as compliant as I would like him to.  He also still uses his  nebulizer p.r.n.  He feels like his exercise tolerance is at least  staying on a stable level, and only has an occasional cough with  primarily clear mucous.   PHYSICAL EXAMINATION:  IN GENERAL:  He is a well-developed male, no  acute distress.  Blood pressure 112/74, pulse 75, temperature is 98.5, weight is 189  pounds.  O2 saturation on room air is 97%.  CHEST:  Reveals mildly decreased breath sounds with a few rhonchi,  otherwise is barely clear.  There are no wheezes.  CARDIAC EXAM:  Reveals a regular rate and rhythm.  LOWER EXTREMITIES:  Without edema.   IMPRESSION:  Severe airflow obstruction that is secondary to both  emphysema as well as asthma.  The patient is on Advair for his asthma  and really has not been as compliant as I would like him to.  I have  continued to urge him to take this on a b.i.d. basis.  I also think that  we should give him a trial of Spiriva 1 puff daily as well, given the  severity of his airflow obstruction.  The patient, apparently, has tried  this before, but states he really did not stay on it for any period of  time.   PLAN:  1. Trial of Spiriva 1puff daily  added to his Advair.  2. I have asked the patient to get on some kind of exercise regimen in      order to improve his quality of life.  3. The patient will followup in 6 months or sooner if there are      problems.     Barbaraann Share, MD,FCCP  Electronically Signed    [facility]  DD: 10/19/2007  DT: 10/20/2007  Job #: [id number]   cc:   Loraine Leriche A. Perini, M.D.

## 2011-05-17 NOTE — H&P (Signed)
Stephen [name], Stephen [name]            ACCOUNT NO.:  [id number]   MEDICAL RECORD NO.:  [id number]          PATIENT TYPE:  INP   LOCATION:  4705                         FACILITY:  [facility]   PHYSICIAN:  Mark A. Perini, M.D.   DATE OF BIRTH:  April 06, 1956   DATE OF ADMISSION:  04/02/2007  DATE OF DISCHARGE:                              HISTORY & PHYSICAL   CHIEF COMPLAINT:  Problems with breathing, pain in chest with coughing  and productive green mucus.   HISTORY OF PRESENT ILLNESS:  Stephen [name] is a very pleasant 55 year old  gentleman with a past history significant for asthma, chronic  bronchitis, emphysema, lung cancer, status post resection in May of 2006  with recurrence and subsequent chemotherapy, and he was treated with  radiation as well.  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  He was seen on January 14th, and at that time was  treated with Nasacort and advised to continue his Advair for his asthma  and sinus problems.  He reports that he was told to stop his Advair and  start another medication which was possibly Symbicort from Dr. [place] Callas  but states that he did not do this, and in fact he had not been on the  Advair until the last two days.  He reports feeling tired, run down.  He  has had no nose congestion.  Yesterday, he felt as though he had a fever  with some sweating.  He has had one to two days of right-sided pain  along his right rib cage.  This hurts mostly when he coughs.  It is not  a pleuritic pain.  He has not been coughing a lot, but when he does  cough, it is productive of some green mucus.  He has had some dyspnea on  exertion but denies any exertional chest pains.  He denies any blood  from above or below.  His urine has turned a darker color.  In the  office, he had markedly reduced peak flows and was tachycardic and had  significant wheezing and decreased breath sounds on  lung exam, and it  was felt as though he would require admission for further care.   PAST MEDICAL HISTORY:  1. Asthma.  2. Chronic bronchitis.  3. Forty plus pack year smoking history, but he quit in 2007.  4. Chronic nasal and sinus congestion, on allergy shots.  5. History of appendectomy and left knee surgery.  6. Rheumatic fever as a child.  7. Seasonal allergic rhinitis and perennial allergic rhinitis.  8. Gastroesophageal reflux disease.  9. Dyslipidemia.  10.Lung cancer status post resection in May of 2006.  He had a      recurrence around January of 2007 and was given radiation and      chemotherapy.  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  12.Emphysema noted by CT scan in September of 2006.  13.Right shoulder arthroscopy for rotator cuff tear, and he had  a bone      spur removed from his right shoulder in January of 2007.  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  His lung cancer      was noted in his mediastinum in March of 2007.   ALLERGIES:  CODEINE.   MEDICATIONS:  1. Albuterol MDI as needed.  2. Advair 250/50, one puff twice a day.  3. Allergy shots, and this is all he has been taking lately.   SOCIAL HISTORY:  He is single.  He has a long-term girlfriend.  He has  no children.  He works for the [employer].  He  quite smoking in May of 2006, rare alcohol use, no drug use.   FAMILY HISTORY:  Father is living, have coronary disease.  Mother died  at age 79 of emphysema and brain cancer.  He had one sister die of  cystic fibrosis at age 18 and has a brother who has bipolar depression.   REVIEW OF SYSTEMS:  As per the history of present illness.   PHYSICAL EXAMINATION:  VITAL SIGNS:  Weight is 174 pounds, which is down  8 pounds  compared to January of 2008, blood pressure 106/78, pulse 120,  oxygen saturation 97% on room air, temperature 97.4.  Peak flow has  ranged from 150-190 meters per minute.  GENERAL:  He is in no acute distress but appears weak.  He is holding  his right chest occasionally.  He has normal mucosa.  Oropharynx is  clear.  He has decreased breath sounds bilaterally with inspiratory and  expiratory wheezing bilaterally, more so on the right than the left.  HEART:  Tachycardic with no murmur, rub or gallop.  There is no edema.  There are no masses or hepatosplenomegaly noted.  There is no rash  noted, and he is alert and oriented x4.   LABORATORY DATA:  Pending at the time of this dictation.   ASSESSMENT/PLAN:  Chronic asthma with acute exacerbation of asthma with  possible superimposed pneumonia.  He also is tachycardic and has  significant right-sided chest pain.  We will admit him to a telemetry  bed.  We will check admission laboratory data including a BNP, cardiac  enzymes and a D-dimer test.  We will cover empirically with a full  treatment dose of Lovenox, but this may be reduced, if his D-dimer is  normal.  We have given him a dose of 1 gram of IM Rocephin in the  office, as well as a dose of 125 mg of Solu-Medrol and a Xopenex  nebulized treatment x1 in the office.  We will continue IV Rocephin and  nebulizer treatments in the hospital.  He will get a portable chest x-  ray and an EKG, and he may well require further CT scan imaging of his  chest.  His current status is fair.  I feel as though his condition is  most consistent with superimposed infection causing an acute asthmatic  exacerbation, but he will need to be worked up for other causes of his  symptoms as well.           ______________________________  Redge Gainer. Waynard Edwards, M.D.     MAP/MEDQ  D:  04/02/2007  T:  04/02/2007  Job:  [id number]  cc:   Sidney Ace, M.D. [facility]  Lajuana Matte, MD

## 2011-05-17 NOTE — Op Note (Signed)
NAMEHUGHES, WYNDHAM            ACCOUNT NO.:  [id number]   MEDICAL RECORD NO.:  [id number]          PATIENT TYPE:  REC   LOCATION:  [facility]                         FACILITY:  [facility]   PHYSICIAN:  Ines Bloomer, M.D. 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   SURGEON:  Ines Bloomer, M.D.   ANESTHESIA:  Local anesthesia with Cetacaine, Xylocaine, and IV sedation.   After local anesthesia, the bronchoscope was passed through the mouth.  The  cords were normal.  The trachea was normal.  Left mainstem, left upper lobe,  and left lower lobe orifices were normal.  Just distal to the medial side of  the right carina and on the right mainstem bronchus, there was almost 80%  occlusion of the mainstem bronchus with a mass.  Biopsies were taken from  this, and pictures were taken from this.  The video bronchoscope was  removed.  Patient returned to the recovery room in stable condition.           ______________________________  Ines Bloomer, M.D.     DPB/MEDQ  D:  04/01/2006  T:  04/02/2006  Job:  [id number]

## 2011-05-17 NOTE — Discharge Summary (Signed)
NAMENITESH, PITSTICK            ACCOUNT NO.:  [id number]   MEDICAL RECORD NO.:  [id number]          PATIENT TYPE:  INP   LOCATION:  5712                         FACILITY:  [facility]   PHYSICIAN:  Mark A. Perini, M.D.   DATE OF BIRTH:  [birth year]   DATE OF ADMISSION:  04/02/2007  DATE OF DISCHARGE:  04/05/2007                               DISCHARGE SUMMARY   DISCHARGE DIAGNOSES:  1. Chronic asthma with acute exacerbation of asthma.  2. Chronic bronchitis with acute exacerbation of bronchitis.  3. Community-acquired pneumonia.  4. Lung cancer, status post resection in May 2006 with a recurrence in      January 2007, status post radiation and chemotherapy, with no clear      evidence of disease currently.  5. Perennial allergic rhinitis.  6. Gastroesophageal reflux.  7. Dyslipidemia.   PROCEDURES:  CT scan of the chest, abdomen and pelvis with IV contrast  was performed on April 02, 2007.  This showed new ground-glass opacities  within the central aspect of the mid and lower right lung, likely mild  pneumonitis, infection pr inflammation; postoperative changes from right  upper lobectomy; and emphysema.  There was no acute abnormality or  evidence of metastatic disease in the abdomen, and the pelvic portion  showed probable mild sigmoid diverticulitis, no focal abscess, free air  or bowel obstruction.  Of note, the patient had no symptoms consistent  with diverticulitis during this entire stay.   DISCHARGE MEDICATIONS:  1. Advair 250/50 mcg one puff twice daily.  2. Augmentin 875 mg twice daily through April 12, 2007.  3. Prednisone tapered through April 09, 2007.  4. Albuterol MDI two puffs every 4 hours as needed.  5. Over-the-counter Mucinex as needed.  6. He is to continue allergy shots and steroid nasal spray as well.   HISTORY OF PRESENT ILLNESS:  Stephen [name] is a pleasant 55 year old gentleman  with past history significant for asthma, bronchitis, emphysema and lung  cancer.  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  On the date of admission  he presented feeling tired and run down.  He felt as though he had a  fever with some sweating the day prior to admission.  He had 1-2 days of  right-sided pain along his right rib cage.  It hurt mostly with coughing  but was not a definitive pleuritic-type pain.  He has had some  productive green mucus.  He reports some dyspnea on exertion.  He denied  any hemoptysis.  In the office he had markedly reduced peak flows and  was tachycardic with significant wheezing and decreased breath sounds on  lung exam, and it was felt that he would require admission.   HOSPITAL COURSE:  Stephen [name] was admitted and underwent CT scan with result as  noted above.  He was treated empirically with Rocephin and azithromycin  as well as intravenous steroids, oxygen and bronchodilator treatment.  He improved steadily with no decline status and by  April 05, 2007, he was  deemed stable for discharge home.   DISCHARGE PHYSICAL EXAM:  VITAL SIGNS:  Blood sugar 94, blood pressure  94/56, heart rate 58 beats per minute, respiratory rate 16, temperature  97.2, 96% saturation on room air.  GENERAL:  He was in no acute distress.  CHEST:  He did have some mild wheezing on exam but was moving air fairly  well.  CARDIAC:  Heart was regular rate and rhythm.  ABDOMEN:  Soft and nontender and benign.   DISCHARGE LABORATORY DATA:  White count 7.5, hemoglobin 11.9, platelet  count 169,000.  Sodium 140, potassium 3.6, chloride 104, CO2 22, BUN 13,  creatinine 0.8, glucose 94.  Phosphorus 2.6.   DISCHARGE INSTRUCTIONS:  Stephen [name] is to increase his activity slowly.  He was  to follow a heart-healthy diet.  He is to call if he has any recurrent  problems, and he will follow up in our office in 2 weeks.            ______________________________  Redge Gainer Waynard Edwards, M.D.     MAP/MEDQ  D:  04/23/2007  T:  04/23/2007  Job:  [id number]   cc:   Lajuana Matte, MD  Percival Spanish, M.D.

## 2011-06-07 ENCOUNTER — Encounter: Payer: Self-pay | Admitting: Internal Medicine

## 2011-06-10 ENCOUNTER — Encounter: Payer: Self-pay | Admitting: Internal Medicine

## 2011-06-10 ENCOUNTER — Ambulatory Visit (INDEPENDENT_AMBULATORY_CARE_PROVIDER_SITE_OTHER): Payer: 59 | Admitting: Internal Medicine

## 2011-06-10 VITALS — BP 108/72 | HR 58 | Temp 97.8°F | Ht 70.0 in | Wt 196.0 lb

## 2011-06-10 DIAGNOSIS — J449 Chronic obstructive pulmonary disease, unspecified: Secondary | ICD-10-CM

## 2011-06-10 NOTE — Progress Notes (Signed)
  Subjective:    Patient ID: Stephen [name], male    DOB: 1956-04-05, 55 y.o.   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 Unchanged PFTs 2009 -> Feb 2012. Feb 2012 PFTs - fev1 1.97L/57%, Ratio 78%,  B)  Quit smoking 2006  C) Rt lung cancer 2006. Ex--throat cancer 2007. RLL 4mm nodule feb 2012-> resolved May 2012  -> Dr Shirline Frees D) Chronic sinus alllergies - Dr [place] Callas, Dr. Jenne Pane.   FU for COPD. Since last visit in Feb 2012, class 2 dyspnea and chronic sinus issues unchanged. No active complaint. Continues with surveillance CT for cancer with Dr. Shirline Frees. Compliant with spiriva and symbicort. No new AECOPD since last visit. Not yet attended rehab due to dad's illness; he states he will call them. Has pending allergy list with Dr. [place] Callas. Denies cough  Review of Systems  Constitutional: Negative for fever and unexpected weight change.  HENT: Positive for congestion. Negative for ear pain, nosebleeds, sore throat, rhinorrhea, sneezing, trouble swallowing, dental problem, postnasal drip and sinus pressure.   Eyes: Negative for redness and itching.  Respiratory: Negative for cough, chest tightness, shortness of breath and wheezing.   Cardiovascular: Negative for palpitations and leg swelling.  Gastrointestinal: Negative for nausea and vomiting.  Genitourinary: Negative for dysuria.  Musculoskeletal: Negative for joint swelling.  Skin: Negative for rash.  Neurological: Negative for headaches.  Hematological: Does not bruise/bleed easily.  Psychiatric/Behavioral: Negative for dysphoric mood. The patient is not nervous/anxious.        Objective:   Physical Exam        Assessment & Plan:

## 2011-06-10 NOTE — Assessment & Plan Note (Signed)
STable gold stage 2 copd  Plan Continue spiriva and symbicort Educated on AECOPD recognition and to call us for Rx over phone [facility] rehab when he is able to  [facility] March 2013 for annual spirometry Can consider repeat CT chest for cancer surveillance if Dr. Shirline Frees has not done one by then

## 2011-06-10 NOTE — Patient Instructions (Signed)
Your copd is stable Call us or come visit [place] anytime there is a copd attack as defined by worsening shortness of breath, cough, phlegm, fever, wheezing Right now continue daily spiriva and symbicort - take samples and discount card REturn to see me in March 2013 with spirometry breathing test

## 2011-08-20 ENCOUNTER — Ambulatory Visit (INDEPENDENT_AMBULATORY_CARE_PROVIDER_SITE_OTHER): Payer: 59 | Admitting: Adult Health

## 2011-08-20 ENCOUNTER — Ambulatory Visit (HOSPITAL_BASED_OUTPATIENT_CLINIC_OR_DEPARTMENT_OTHER)
Admission: RE | Admit: 2011-08-20 | Discharge: 2011-08-20 | Disposition: A | Discharge status: Home / Self Care | Payer: 59 | Source: Ambulatory Visit | Attending: Adult Health | Admitting: Adult Health

## 2011-08-20 ENCOUNTER — Encounter: Payer: Self-pay | Admitting: Adult Health

## 2011-08-20 ENCOUNTER — Telehealth: Payer: Self-pay | Admitting: *Deleted

## 2011-08-20 VITALS — BP 124/80 | HR 69 | Temp 97.4°F | Ht 70.0 in | Wt 186.0 lb

## 2011-08-20 DIAGNOSIS — Z85118 Personal history of other malignant neoplasm of bronchus and lung: Secondary | ICD-10-CM | POA: Insufficient documentation

## 2011-08-20 DIAGNOSIS — J4489 Other specified chronic obstructive pulmonary disease: Secondary | ICD-10-CM

## 2011-08-20 DIAGNOSIS — J449 Chronic obstructive pulmonary disease, unspecified: Secondary | ICD-10-CM

## 2011-08-20 MED ORDER — PREDNISONE 10 MG PO TABS: ORAL_TABLET | ORAL | Status: AC

## 2011-08-20 MED ORDER — LEVOFLOXACIN 500 MG PO TABS: 500.0000 mg | ORAL_TABLET | Freq: Every day | ORAL | Status: AC

## 2011-08-20 MED ORDER — HYDROCODONE-HOMATROPINE 5-1.5 MG/5ML PO SYRP: 5.0000 mL | ORAL_SOLUTION | ORAL | Status: AC | PRN

## 2011-08-20 MED ORDER — MOXIFLOXACIN HCL 400 MG PO TABS: 400.0000 mg | ORAL_TABLET | Freq: Every day | ORAL | Status: DC

## 2011-08-20 NOTE — Progress Notes (Signed)
  Subjective:    Patient ID: Stephen [name], male    DOB: 1956-02-17, 55 y.o.   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 Unchanged PFTs 2009 -> Feb 2012. Feb 2012 PFTs - fev1 1.97L/57%, Ratio 78%,  B)  Quit smoking 2006  C) Rt lung cancer 2006. Ex--throat cancer 2007. RLL 4mm nodule feb 2012-> resolved May 2012  -> Dr Shirline Frees D) Chronic sinus alllergies - Dr [place] Callas, Dr. Jenne Pane.   06/10/11 FU for COPD. Since last visit in Feb 2012, class 2 dyspnea and chronic sinus issues unchanged. No active complaint. Continues with surveillance CT for cancer with Dr. Shirline Frees. Compliant with spiriva and symbicort. No new AECOPD since last visit. Not yet attended rehab due to dad's illness; he states he will call them. Has pending allergy list with Dr. [place] Callas. Denies cough  08/20/2011 ACute OV  Pt presents for work in visit. Complains of recurrent cough over last 6 weeks with  productive cough with thick green mucus, right  back pain, increased SOB all the time, and tired. Pt has had 2 rounds of abx and pred taper by PCP . Last abx was 4 weeks ago. Had some  improvement but never went away. Over last week, cough increased with severe coughing fits. More dyspnea and wheezing. Using otc without help. Green/yellow mucus. Cough is wearing him out.   Pt has not started Rehab. Has planned CT for 01/2012 by Dr. Gwenyth Bouillon. Weight is down 14lbs says he has changed his diet some.         ROS :  Constitutional:   No  weight loss, night sweats,  Fevers, chills,  +fatigue, or  lassitude.  HEENT:   No headaches,  Difficulty swallowing,  Tooth/dental problems, or  Sore throat,                No sneezing, itching, ear ache, nasal congestion, post nasal drip,   CV:  No chest pain,  Orthopnea, PND, swelling in lower extremities, anasarca, dizziness, palpitations, syncope.   GI  No heartburn, indigestion, abdominal pain, nausea, vomiting, diarrhea, change in bowel habits, loss of appetite, bloody stools.    Resp:   No coughing up of blood.   No chest wall deformity  Skin: no rash or lesions.  GU: no dysuria, change in color of urine, no urgency or frequency.  No flank pain, no hematuria   MS:  No joint pain or swelling.  No decreased range of motion.     Psych:  No change in mood or affect. No depression or anxiety.  .      Objective:   Physical Exam GEN: A/Ox3; pleasant , NAD,   HEENT:  [place]/AT,  EACs-clear, TMs-wnl, NOSE-clear, THROAT-clear, no lesions, no postnasal drip or exudate noted.   NECK:  Supple w/ fair ROM; no JVD; normal carotid impulses w/o bruits; no thyromegaly or nodules palpated; no lymphadenopathy.  RESP  Coarse BS w/ scattered rhonchi and few  Exp wheezes no accessory muscle use, no dullness to percussion  CARD:  RRR, no m/r/g  , no peripheral edema, pulses intact, no cyanosis or clubbing.  GI:   Soft & nt; nml bowel sounds; no organomegaly or masses detected.  Musco: Warm bil, no deformities or joint swelling noted.   Neuro: alert, no focal deficits noted.    Skin: Warm, no lesions or rashes          Assessment & Plan:

## 2011-08-20 NOTE — Telephone Encounter (Signed)
Received fax from pharmacy stating "Avelox is non-formulary on insurance" and asked "can we use gen Cipro 500mg ". Per TP instead ok to give Levaquin 500mg  once daily x 7 days 0 fills. Verbal given to pharmacist.

## 2011-08-20 NOTE — Progress Notes (Signed)
Addended by: Julaine Hua on: 08/20/2011 11:20 AM   Modules accepted: Orders

## 2011-08-20 NOTE — Patient Instructions (Signed)
Avelox 400mg   daily for 7 days  Mucinex DM Twice daily  As needed  Cough/congestion  Fluids and rest  Prednisone taper over next week.  I will call with xray results.  follow up Dr. Marchelle Gearing in 3-4 weeks, on return will consider adding Daliresp to your regimen.  Call if you change your mind on pulmonary rehab.  Please contact office for sooner follow up if symptoms do not improve or worsen or seek emergency care

## 2011-08-20 NOTE — Assessment & Plan Note (Addendum)
Recurrent exacerbations  Would consider Daliresp on return  Needs to follow up for pulmonary rehab.  Xray today  Neb tx in office   Plan:   Avelox 400mg   daily for 7 days  Mucinex DM Twice daily  As needed  Cough/congestion  Fluids and rest  Prednisone taper over next week.  I will call with xray results.  follow up Dr. Marchelle Gearing in 3-4 weeks, on return will consider adding Daliresp to your regimen.  Call if you change your mind on pulmonary rehab.  Please contact office for sooner follow up if symptoms do not improve or worsen or seek emergency care

## 2011-08-26 ENCOUNTER — Encounter (INDEPENDENT_AMBULATORY_CARE_PROVIDER_SITE_OTHER): Payer: Self-pay | Admitting: Surgery

## 2011-08-26 ENCOUNTER — Ambulatory Visit (INDEPENDENT_AMBULATORY_CARE_PROVIDER_SITE_OTHER): Payer: 59 | Admitting: Surgery

## 2011-08-26 VITALS — BP 124/88 | HR 64 | Temp 97.6°F | Ht 70.0 in | Wt 185.6 lb

## 2011-08-26 DIAGNOSIS — K645 Perianal venous thrombosis: Secondary | ICD-10-CM | POA: Insufficient documentation

## 2011-08-26 MED ORDER — HYDROCORTISONE ACE-PRAMOXINE 2.5-1 % RE CREA: TOPICAL_CREAM | Freq: Three times a day (TID) | RECTAL | Status: AC | PRN

## 2011-08-26 NOTE — Progress Notes (Signed)
Chief Complaint  Patient presents with  . Rectal Pain    urgent- eval [facility]    HISTORY: Patient is a 55 year old white male is referred by his primary physician for anal pain. Patient notes intermittent discomfort at the anus. He is sometimes awakened at night from sleep by what feels like a spasm in the area of the rectum. He has noted a small cutaneous lesion on the left anoderm. He has been previously told this was a skin tag. He has had no prior anorectal surgery. He did undergo colonoscopy 3 years ago and had a small benign polyp removed. He has experienced minimal bleeding. He presents today for evaluation.  Past Medical History  Diagnosis Date  . Lung cancer     lung ca dx 06  . Throat cancer     throat ca dx 2007  . Allergic rhinitis, cause unspecified   . Other emphysema   . Unspecified vitamin D deficiency   . Dyspnea   . Osteoporosis   . Thrombosed hemorrhoids      Current Outpatient Prescriptions  Medication Sig Dispense Refill  . albuterol (PROVENTIL HFA;VENTOLIN HFA) 108 (90 BASE) MCG/ACT inhaler Inhale 2 puffs into the lungs every 6 (six) hours as needed.        Marland Kitchen albuterol (PROVENTIL) (2.5 MG/3ML) 0.083% nebulizer solution Take 2.5 mg by nebulization every 6 (six) hours as needed.        Marland Kitchen alendronate (FOSAMAX) 70 MG tablet Take 70 mg by mouth every 7 (seven) days. Take with a full glass of water on an empty stomach.       Marland Kitchen aspirin 81 MG tablet Take 81 mg by mouth daily.        . budesonide-formoterol (SYMBICORT) 160-4.5 MCG/ACT inhaler Inhale 2 puffs into the lungs 2 (two) times daily. Sample x 3 days       . Cholecalciferol (VITAMIN D) 1000 UNITS capsule Take 1,000 Units by mouth daily.        Marland Kitchen guaiFENesin (MUCINEX) 600 MG 12 hr tablet Take 1,200 mg by mouth daily.        Marland Kitchen HYDROcodone-homatropine (HYDROMET) 5-1.5 MG/5ML syrup Take 5 mLs by mouth every 4 (four) hours as needed for cough.  240 mL  0  . levofloxacin (LEVAQUIN) 500 MG tablet Take 1 tablet (500  mg total) by mouth daily.  7 tablet  0  . levothyroxine (SYNTHROID, LEVOTHROID) 100 MCG tablet Take 100 mcg by mouth daily.        . predniSONE (DELTASONE) 10 MG tablet 4 tabs for 2 days, then 3 tabs for 2 days, 2 tabs for 2 days, then 1 tab for 2 days, then stop  20 tablet  0  . temazepam (RESTORIL) 30 MG capsule Take 30 mg by mouth at bedtime as needed.        . tiotropium (SPIRIVA) 18 MCG inhalation capsule Place 18 mcg into inhaler and inhale daily.        . Vitamin D, Ergocalciferol, (DRISDOL) 50000 UNITS CAPS Take 1 tablet by mouth Once every 2 weeks.      . hydrocortisone-pramoxine (ANALPRAM-HC) 2.5-1 % rectal cream Place rectally 3 (three) times daily as needed for hemorrhoids (burning, pain).  30 g  3     Allergies  Allergen Reactions  . Codeine   . Cymbalta (Duloxetine Hcl) Nausea And Vomiting  . Montelukast Sodium     REACTION: gets sick with sinusitis each time he takes it     Family History  Problem Relation Age of Onset  . Cancer Mother     Brain tumor  . COPD Mother   . Cystic fibrosis Sister      History   Social History  . Marital Status: Single    Spouse Name: N/A    Number of Children: N/A  . Years of Education: N/A   Occupational History  . Disabled [address]   Social History Main Topics  . Smoking status: Former Smoker -- 1.5 packs/day for 32 years    Types: Cigarettes    Quit date: 12/30/2004  . Smokeless tobacco: Never Used   Comment: 1 ppd x 30 years  . Alcohol Use: Yes     rare  . Drug Use: No  . Sexually Active: None   Other Topics Concern  . None   Social History Narrative  . None     REVIEW OF SYSTEMS - PERTINENT POSITIVES ONLY: Occasional pain associated with bowel movement. Occasional blood on toilet tissue with bowel movement. Patient notes a distant history of anal fissure.   EXAM: Filed Vitals:   08/26/11 1429  BP: 124/88  Pulse: 64  Temp: 97.6 F (36.4 C)    HEENT: normocephalic; pupils equal and  reactive; sclerae clear; dentition good; mucous membranes moist NECK:  ; symmetric on extension; no palpable anterior or posterior cervical lymphadenopathy; no supraclavicular masses; no tenderness CHEST: clear to auscultation bilaterally without rales, rhonchi, or wheezes CARDIAC: regular rate and rhythm without significant murmur; peripheral pulses are full EXT:  non-tender without edema; no deformity NEURO: no gross focal deficits; no sign of tremor ANORECTAL: small 2 mm tag on left, not inflamed, not tender; palpable thrombosed ext hem vein left anterior, no ulceration, minimally tender; rectal exam without palpable mass; posterior midline with healed fissure   LABORATORY RESULTS: See E-Chart for most recent results   RADIOLOGY RESULTS: See E-Chart or I-Site for most recent results   IMPRESSION: #1 benign perianal skin tag #2 thrombosed external hemorrhoid without complication, minimal discomfort, resolving spontaneously #3 history of posterior anal fissure, resolved   PLAN: Patient I discussed the above issues. At this point he needs no surgical intervention. I have prescribed Analpram 2.5% hydrocortisone cream for him to use topically as needed. 4 events of rectal spasm, I have suggested soaks and hot tub. I have also suggested that he begin taking MiraLax daily for the next one month.  Patient will return for evaluation as needed.   Velora Heckler, MD, FACS General & Endocrine Surgery [facility]      Visit Diagnoses: 1. Hemorrhoids, external, thrombosed     Primary Care Physician: Ezequiel Kayser, MD

## 2011-09-12 ENCOUNTER — Ambulatory Visit: Payer: 59 | Admitting: Internal Medicine

## 2011-12-18 ENCOUNTER — Ambulatory Visit (INDEPENDENT_AMBULATORY_CARE_PROVIDER_SITE_OTHER): Payer: 59 | Admitting: Internal Medicine

## 2011-12-18 ENCOUNTER — Encounter: Payer: Self-pay | Admitting: Internal Medicine

## 2011-12-18 VITALS — BP 118/80 | HR 91 | Temp 97.6°F | Ht 70.0 in | Wt 196.6 lb

## 2011-12-18 DIAGNOSIS — J449 Chronic obstructive pulmonary disease, unspecified: Secondary | ICD-10-CM

## 2011-12-18 NOTE — Progress Notes (Signed)
Subjective:    Patient ID: Stephen [name], male    DOB: 09-17-1956, 55 y.o.   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 Unchanged PFTs 2009 -> Feb 2012. Feb 2012 PFTs - fev1 1.97L/57%, Ratio 78%,  B)  Quit smoking 2006  C) Rt lung cancer 2006. Ex--throat cancer 2007. RLL 4mm nodule feb 2012-> resolved May 2012  -> Dr Shirline Frees D) Chronic sinus alllergies - Dr [place] Callas, Dr. Jenne Pane.   - last allergy test fall 2012 - plan to increase shots  06/10/11 FU for COPD. Since last visit in Feb 2012, class 2 dyspnea and chronic sinus issues unchanged. No active complaint. Continues with surveillance CT for cancer with Dr. Shirline Frees. Compliant with spiriva and symbicort. No new AECOPD since last visit. Not yet attended rehab due to dad's illness; he states he will call them. Has pending allergy list with Dr. [place] Callas. Denies cough  08/20/2011 ACute OV  Pt presents for work in visit. Complains of recurrent cough over last 6 weeks with  productive cough with thick green mucus, right  back pain, increased SOB all the time, and tired. Pt has had 2 rounds of abx and pred taper by PCP . Last abx was 4 weeks ago. Had some  improvement but never went away. Over last week, cough increased with severe coughing fits. More dyspnea and wheezing. Using otc without help. Green/yellow mucus. Cough is wearing him out.   Pt has not started Rehab. Has planned CT for 01/2012 by Dr. Gwenyth Bouillon. Weight is down 14lbs says he has changed his diet some.   REC Avelox 400mg  daily for 7 days  Mucinex DM Twice daily As needed Cough/congestion  Fluids and rest  Prednisone taper over next week.  I will call with xray results.  follow up Dr. Marchelle Gearing in 3-4 weeks, on return will consider adding Daliresp to your regimen.  Call if you change your mind on pulmonary rehab.  Please contact office for sooner follow up if symptoms do not improve or worsen or seek emergency care   OV 12/18/2011 [name] presents acutely at  the request of his primary care physician. He says for the last 10 days or so he has noticed increased sinus drainage. Then last week he went to the primary care physician's office to drop off insurance papers. He feels he was exposed to sick contacts and 2 days later developed runny nose worsening, scratchy throat, cough, wheezing all worse. Then 3 days ago primary care physician diagnosed him with COPD exacerbation and placed him on 6 days Levaquin and 6 days prednisone taper. Today he is in the middle of his prednisone taper. He feels remarkably better. He states he is here at the request of his primary care physician only. He is not interested in changing his COPD exacerbation therapy esp prednisone duration or dose due to ongoing osteoporosis.   Past, Family, Social reviewed: In fall 2012 he had repeat allergy testing and apparently skin test is significantly positive and his allergy shots are going to be increased in dosage. He wanted me to touch base with Dr. [place] Callas about this        Review of Systems  Constitutional: Negative for fever and unexpected weight change.  HENT: Negative for ear pain, nosebleeds, congestion, sore throat, rhinorrhea, sneezing, trouble swallowing, dental problem, postnasal drip and sinus pressure.   Eyes: Negative for redness and itching.  Respiratory: Positive for cough and shortness of breath. Negative for chest  tightness and wheezing.   Cardiovascular: Negative for palpitations and leg swelling.  Gastrointestinal: Negative for nausea and vomiting.  Genitourinary: Negative for dysuria.  Musculoskeletal: Negative for joint swelling.  Skin: Negative for rash.  Neurological: Negative for headaches.  Hematological: Does not bruise/bleed easily.  Psychiatric/Behavioral: Negative for dysphoric mood. The patient is not nervous/anxious.        Objective:   Physical Exam GEN: A/Ox3; pleasant , NAD,   HEENT:  /AT,  EACs-clear, TMs-wnl, NOSE-clear,  THROAT-clear, no lesions, no postnasal drip or exudate noted.   NECK:  Supple w/ fair ROM; no JVD; normal carotid impulses w/o bruits; no thyromegaly or nodules palpated; no lymphadenopathy.  RESP  Has scatterd Exp wheezes no accessory muscle use, no dullness to percussion. No crackles no respiratory distress  CARD:  RRR, no m/r/g  , no peripheral edema, pulses intact, no cyanosis or clubbing.  GI:   Soft & nt; nml bowel sounds; no organomegaly or masses detected.  Musco: Warm bil, no deformities or joint swelling noted.   Neuro: alert, no focal deficits noted.    Skin: Warm, no lesions or rashes              Assessment & Plan:

## 2011-12-18 NOTE — Patient Instructions (Signed)
UR in the middle of a COPD attack Glad you are feeling better. However, I think he will benefit from extended prednisone But I respect her desire to be careful about how much prednisone you will take  due to concern of osteoporosis which you  already suffer from Therefore just finished the remaining 3 days of prednisone given by  primary care physician If you get worse please call us anytime and we can consider giving you extended prednisone Followup for COPD in 6 months with spirometry

## 2011-12-18 NOTE — Assessment & Plan Note (Signed)
Curretly wheezing and in exacerbation. Subjectively much better. I advised extending prednisone taper but due to ongoing osteoporosis and subjective improvement. ADvised him to call us if not better or worsens for extended prednisone. He agrees to plan. ROV 6 months

## 2011-12-20 ENCOUNTER — Telehealth: Payer: Self-pay | Admitting: Internal Medicine

## 2011-12-20 MED ORDER — PREDNISONE 10 MG PO TABS: ORAL_TABLET | ORAL | Status: DC

## 2011-12-20 NOTE — Telephone Encounter (Signed)
Ok to give another 6 day course Prednisone 10 mg take  4 each am x 2 days,   2 each am x 2 days,  1 each am x2days and stop

## 2011-12-20 NOTE — Telephone Encounter (Signed)
Spoke with the pt and he states when he saw MR on 12-18-11 he wanted to extend the pt prednisone taper but the patient refused. At that time  the pt was on a 6 day taper of prednisone and Levaquin 7 days from PCP.  The pt states  today is last day of prednisone and he has one dose left of levaquin.  Today he is calling back because he is having increased SOB, still wheezing, and productive cough with yellow phlegm has not improved. He is requesting that the extended taper be called into [facility] drug. He also wants to know does he need and extension on the Levaquin as well? Please advise. [name], CMA Allergies  Allergen Reactions  . Codeine   . Cymbalta (Duloxetine Hcl) Nausea And Vomiting  . Montelukast Sodium     REACTION: gets sick with sinusitis each time he takes it

## 2011-12-20 NOTE — Telephone Encounter (Signed)
Pt is aware of MW recs for prednisone and rx was sent to [facility] on [address]. Nothing further was needed

## 2011-12-20 NOTE — Telephone Encounter (Signed)
lmtcb x1 

## 2012-01-03 ENCOUNTER — Telehealth: Payer: Self-pay | Admitting: Internal Medicine

## 2012-01-03 ENCOUNTER — Other Ambulatory Visit: Payer: Self-pay | Admitting: Internal Medicine

## 2012-01-03 DIAGNOSIS — C3490 Malignant neoplasm of unspecified part of unspecified bronchus or lung: Secondary | ICD-10-CM

## 2012-01-03 NOTE — Telephone Encounter (Signed)
gave pt appt info for lab/ct on 5/3 and md visit on 5/7.  pt stated that he has been hutring in his side some and will be seeing his pcp on 1/10 and will call if needed aom

## 2012-01-09 ENCOUNTER — Other Ambulatory Visit: Payer: Self-pay | Admitting: Internal Medicine

## 2012-01-09 DIAGNOSIS — H5440 Blindness, one eye, unspecified eye: Secondary | ICD-10-CM

## 2012-01-10 ENCOUNTER — Ambulatory Visit
Admission: RE | Admit: 2012-01-10 | Discharge: 2012-01-10 | Disposition: A | Discharge status: Home / Self Care | Payer: 59 | Source: Ambulatory Visit | Attending: Internal Medicine | Admitting: Internal Medicine

## 2012-01-10 DIAGNOSIS — H5440 Blindness, one eye, unspecified eye: Secondary | ICD-10-CM

## 2012-01-13 ENCOUNTER — Other Ambulatory Visit: Payer: 59

## 2012-01-13 HISTORY — PX: TRANSTHORACIC ECHOCARDIOGRAM: SHX275

## 2012-03-02 ENCOUNTER — Telehealth: Payer: Self-pay | Admitting: Internal Medicine

## 2012-03-02 NOTE — Telephone Encounter (Signed)
Ok to see him sooner. 

## 2012-03-02 NOTE — Telephone Encounter (Signed)
Having some " Pain in my lungs" Had an asthma attack about  a month ago and a TIA . He had brain scan and heart test.  Since then he has not felt good-his" lungs ache " He is worried his cancer has returned. He wants to know if his CT scan and followup can be moved up sooner than May 3 . I told him I will consult with Dr Donnald Garre.

## 2012-03-03 ENCOUNTER — Other Ambulatory Visit: Payer: Self-pay | Admitting: Internal Medicine

## 2012-03-03 ENCOUNTER — Telehealth: Payer: Self-pay | Admitting: Internal Medicine

## 2012-03-03 NOTE — Telephone Encounter (Signed)
l/m with 3/13 and 3/21 appt info aom

## 2012-03-03 NOTE — Telephone Encounter (Signed)
Called pt and told him Dr Donnald Garre will see him sooner and  someone will call him back with appointments

## 2012-03-11 ENCOUNTER — Other Ambulatory Visit (HOSPITAL_BASED_OUTPATIENT_CLINIC_OR_DEPARTMENT_OTHER): Payer: 59 | Admitting: Lab

## 2012-03-11 ENCOUNTER — Ambulatory Visit (HOSPITAL_COMMUNITY)
Admission: RE | Admit: 2012-03-11 | Discharge: 2012-03-11 | Disposition: A | Discharge status: Home / Self Care | Payer: 59 | Source: Ambulatory Visit | Attending: Internal Medicine | Admitting: Internal Medicine

## 2012-03-11 ENCOUNTER — Encounter (HOSPITAL_COMMUNITY): Payer: Self-pay

## 2012-03-11 DIAGNOSIS — C3490 Malignant neoplasm of unspecified part of unspecified bronchus or lung: Secondary | ICD-10-CM | POA: Insufficient documentation

## 2012-03-11 DIAGNOSIS — J438 Other emphysema: Secondary | ICD-10-CM | POA: Insufficient documentation

## 2012-03-11 DIAGNOSIS — M899 Disorder of bone, unspecified: Secondary | ICD-10-CM | POA: Insufficient documentation

## 2012-03-11 LAB — CMP (CANCER CENTER ONLY)
ALT(SGPT): 35 U/L (ref 10–47)
AST: 26 U/L (ref 11–38)
Alkaline Phosphatase: 98 U/L — ABNORMAL HIGH (ref 26–84)
Calcium: 9 mg/dL (ref 8.0–10.3)
Chloride: 98 mEq/L (ref 98–108)
Creat: 1 mg/dl (ref 0.6–1.2)
Total Bilirubin: 0.8 mg/dl (ref 0.20–1.60)

## 2012-03-11 MED ORDER — IOHEXOL 300 MG/ML  SOLN
100.0000 mL | Freq: Once | INTRAMUSCULAR | Status: AC | PRN
  Administered 2012-03-11: 100 mL via INTRAVENOUS

## 2012-03-19 ENCOUNTER — Telehealth: Payer: Self-pay | Admitting: Internal Medicine

## 2012-03-19 ENCOUNTER — Ambulatory Visit (HOSPITAL_BASED_OUTPATIENT_CLINIC_OR_DEPARTMENT_OTHER): Payer: 59 | Admitting: Internal Medicine

## 2012-03-19 VITALS — BP 130/80 | HR 81 | Temp 97.0°F | Ht 70.0 in | Wt 202.6 lb

## 2012-03-19 DIAGNOSIS — C3410 Malignant neoplasm of upper lobe, unspecified bronchus or lung: Secondary | ICD-10-CM

## 2012-03-19 DIAGNOSIS — Z8521 Personal history of malignant neoplasm of larynx: Secondary | ICD-10-CM

## 2012-03-19 DIAGNOSIS — C3490 Malignant neoplasm of unspecified part of unspecified bronchus or lung: Secondary | ICD-10-CM

## 2012-03-19 NOTE — Telephone Encounter (Signed)
Gv pt appt for sept2013.  scheduled ct scan on 09/19 @ [facility]

## 2012-03-21 NOTE — Progress Notes (Signed)
[facility] Telephone:([phone]   Fax:(336) [phone]  OFFICE PROGRESS NOTE  Ezequiel Kayser, MD, MD [address] [facility] [place] [address]  PRINCIPAL DIAGNOSES:   1. Recurrent non-small cell lung cancer, presented with endobronchial lesion involving the right main stem bronchus diagnosed in March 2007.  2. History of stage IB non-small cell lung cancer diagnosed in May 2006.  3. 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  PRIOR THERAPY:   1. Status post right upper lobectomy on May 01, 2005 under the care of Dr. Edwyna Shell.  2. Status post laryngoscopy with CO2 laser excision of the right vocal cord lesion under the care of Dr. Jenne Pane in March 2007.  3. Status post concurrent chemoradiation with weekly carboplatin and paclitaxel.  Last dose was given May 12, 2006.  4. Status post 3 cycles of consolidation chemotherapy with docetaxel.  Last dose was given August 21, 2006.  CURRENT THERAPY:  Observation.  INTERVAL HISTORY: Stephen [name] 56 y.o. male returns to the clinic today for followup visit. He requested to be seen sooner because of recent shortness of breath and pain on the right side of his chest. He was treated recently by his primary care physician for COPD and asthma. The patient has repeat CT scan of the neck and chest performed recently and he is here today for evaluation and discussion of his scan results.   MEDICAL HISTORY: Past Medical History  Diagnosis Date  . Allergic rhinitis, cause unspecified   . Other emphysema   . Unspecified vitamin D deficiency   . Dyspnea   . Osteoporosis   . Thrombosed hemorrhoids   . Lung cancer     lung ca dx 06  . Throat cancer     throat ca dx 2007    ALLERGIES:  is allergic to codeine; cymbalta; and montelukast sodium.  MEDICATIONS:  Current Outpatient Prescriptions  Medication Sig Dispense Refill  . albuterol (PROVENTIL HFA;VENTOLIN  HFA) 108 (90 BASE) MCG/ACT inhaler Inhale 2 puffs into the lungs every 6 (six) hours as needed.        Marland Kitchen albuterol (PROVENTIL) (2.5 MG/3ML) 0.083% nebulizer solution Take 2.5 mg by nebulization every 6 (six) hours as needed.        Marland Kitchen alendronate (FOSAMAX) 70 MG tablet Take 70 mg by mouth every 7 (seven) days. Take with a full glass of water on an empty stomach.       Marland Kitchen aspirin 81 MG tablet Take 81 mg by mouth daily.        . budesonide-formoterol (SYMBICORT) 160-4.5 MCG/ACT inhaler Inhale 2 puffs into the lungs 2 (two) times daily. Sample x 3 days       . Cholecalciferol (VITAMIN D) 1000 UNITS capsule Take 1,000 Units by mouth daily.        Marland Kitchen levothyroxine (SYNTHROID, LEVOTHROID) 100 MCG tablet Take 100 mcg by mouth daily.        Marland Kitchen tiotropium (SPIRIVA) 18 MCG inhalation capsule Place 18 mcg into inhaler and inhale daily.        . Vitamin D, Ergocalciferol, (DRISDOL) 50000 UNITS CAPS Take 1 tablet by mouth Once every 2 weeks.      Marland Kitchen guaiFENesin (MUCINEX) 600 MG 12 hr tablet Take 1,200 mg by mouth daily.        . temazepam (RESTORIL) 30 MG capsule Take 30 mg by mouth at bedtime as needed.  SURGICAL HISTORY:  Past Surgical History  Procedure Date  . Appendectomy   . Lung lobectomy     RUL  . Rotator cuff repair   . Knee surgery   . Shoulder surgery   . Throat surgery     Laser surgery for throat cancer    REVIEW OF SYSTEMS:  A comprehensive review of systems was negative except for: Respiratory: positive for dyspnea on exertion and emphysema   PHYSICAL EXAMINATION: General appearance: alert, cooperative and no distress Neck: no adenopathy Lymph nodes: Cervical, supraclavicular, and axillary nodes normal. Resp: clear to auscultation bilaterally Cardio: regular rate and rhythm, S1, S2 normal, no murmur, click, rub or gallop GI: soft, non-tender; bowel sounds normal; no masses,  no organomegaly Extremities: extremities normal, atraumatic, no cyanosis or edema  ECOG PERFORMANCE  STATUS: 0 - Asymptomatic  Blood pressure 130/80, pulse 81, temperature 97 F (36.1 C), temperature source Oral, height 5\' 10"  (1.778 m), weight 202 lb 9.6 oz (91.899 kg).  LABORATORY DATA: Lab Results  Component Value Date   WBC 5.4 05/02/2011   HGB 14.8 05/02/2011   HCT 43.3 05/02/2011   MCV 85.0 05/02/2011   PLT 152 05/02/2011      Chemistry      Component Value Date/Time   NA 142 03/11/2012 1125   NA 139 08/03/2010 0800   K 3.8 03/11/2012 1125   K 3.6 08/03/2010 0800   CL 98 03/11/2012 1125   CL 106 08/03/2010 0800   CO2 30 03/11/2012 1125   CO2 27 08/03/2010 0800   BUN 18 03/11/2012 1125   BUN 13 08/03/2010 0800   CREATININE 1.0 03/11/2012 1125   CREATININE 0.98 08/03/2010 0800      Component Value Date/Time   CALCIUM 9.0 03/11/2012 1125   CALCIUM 9.3 08/03/2010 0800   ALKPHOS 98* 03/11/2012 1125   ALKPHOS 128* 08/03/2010 0800   AST 26 03/11/2012 1125   AST 19 08/03/2010 0800   ALT 17 08/03/2010 0800   BILITOT 0.80 03/11/2012 1125   BILITOT 0.8 08/03/2010 0800       RADIOGRAPHIC STUDIES: Ct Soft Tissue Neck W Contrast  03/11/2012  *RADIOLOGY REPORT*  Clinical Data: Lung cancer.  Head and neck cancer.  Status post chemo and radiation therapy.  CT NECK WITH CONTRAST  Technique:  Multidetector CT imaging of the neck was performed with intravenous contrast.  Contrast: OMNIPAQUE IOHEXOL 300 MG/ML IJ SOLN  Comparison: CT neck with contrast 01/08/2007.  Findings: Asymmetric fat along the left true vocal cord is compatible with prior surgery and treatment.  There is no evidence for residual or recurrent tumor.  No focal mucosal or submucosal lesions are present.  No significant cervical adenopathy is present.  Spondylosis of the cervical spine has progressed since the prior exam with increasing endplate sclerotic changes at C3-4, C5-6, and C6-7.  Multilevel osseous foraminal stenosis is again noted.  The soft tissue and fluid in the right upper lobe are more prominent on the prior CT.  Please see the  dedicated report of CT chest from the same day.  IMPRESSION:  1.  No evidence for residual or recurrent disease in the neck. 2.  Post-treatment changes of the left true vocal cord. 3.  Increased fluid and soft tissue at the right lung apex.  Please see a full discussion in the chest CT report from the same day.  Original Report Authenticated By: Jamesetta Orleans. MATTERN, M.D.   Ct Chest W Contrast  03/11/2012  *RADIOLOGY REPORT*  Clinical Data: Lung cancer.  CT CHEST WITH CONTRAST  Technique:  Multidetector CT imaging of the chest was performed following the standard protocol during bolus administration of intravenous contrast.  Contrast: OMNIPAQUE IOHEXOL 300 MG/ML IJ SOLN  Comparison: 05/02/2011.  Findings: High right paratracheal lymph node measures 9 mm short axis, stable.  No definite hilar or axillary adenopathy.  Air in the main pulmonary artery and right ventricle is presumably iatrogenic.  Heart size normal.  No pericardial effusion.  Mixing artifact is seen in the inferior vena cava.  Sub centimeter short axis lymph node adjacent to the distal descending thoracic aorta is unchanged.  There are postoperative changes and marked volume loss in the right hemithorax with increasing consolidation in the apex.  An ovoid area of low attenuation is seen within (image 16).  Underlying changes of radiation therapy and fibrosis are noted as well.  Tiny 3 mm nodule is seen anteriorly, stable.  Emphysema.  No pleural fluid.  Incidental imaging of the upper abdomen shows an 8 mm gastrohepatic ligament lymph node, stable.  A 10 mm lucent lesion in the L1 vertebral body is unchanged.  IMPRESSION:  Increasing collapse/consolidation in the apical right upper lobe, with a central area of low attenuation.  Difficult to completely exclude recurrent disease.  Findings are superimposed on postoperative changes and radiation fibrosis.  Original Report Authenticated By: Reyes Ivan, M.D.    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 He has no significant evidence for disease progression on his recent scan  PLAN: I discussed the scan results with the patient. I recommended for him continuous observation for now with repeat CT scan of the chest in 6 months. He was advised to call me immediately if he has any concerning symptoms in the interval.  All questions were answered. The patient knows to call the clinic with any problems, questions or concerns. We can certainly see the patient much sooner if necessary.

## 2012-05-01 ENCOUNTER — Other Ambulatory Visit (HOSPITAL_COMMUNITY): Payer: 59

## 2012-05-01 ENCOUNTER — Other Ambulatory Visit: Payer: 59 | Admitting: Lab

## 2012-05-04 ENCOUNTER — Other Ambulatory Visit (HOSPITAL_COMMUNITY): Payer: 59

## 2012-05-05 ENCOUNTER — Ambulatory Visit: Payer: 59 | Admitting: Internal Medicine

## 2012-06-04 ENCOUNTER — Ambulatory Visit (INDEPENDENT_AMBULATORY_CARE_PROVIDER_SITE_OTHER): Payer: 59 | Admitting: Internal Medicine

## 2012-06-04 ENCOUNTER — Encounter: Payer: Self-pay | Admitting: Internal Medicine

## 2012-06-04 VITALS — BP 132/88 | HR 66 | Temp 98.0°F | Ht 73.5 in | Wt 200.8 lb

## 2012-06-04 DIAGNOSIS — J449 Chronic obstructive pulmonary disease, unspecified: Secondary | ICD-10-CM

## 2012-06-04 NOTE — Assessment & Plan Note (Signed)
#  COPD Glad copd is stable Continue medications Have flu shot in fall I will re-refer you to pulmonary rehab; do not know why they did not call you back  #FOLLOWUP Return in 9 months or call sooner if needed Spirometry with Tammy Wilson at followup CAT score at followup 

## 2012-06-04 NOTE — Patient Instructions (Signed)
#  COPD Glad copd is stable Continue medications Have flu shot in fall I will re-refer you to pulmonary rehab; do not know why they did not call you back  #FOLLOWUP Return in 9 months or call sooner if needed Spirometry with Jerolyn Shin at followup CAT score at followup

## 2012-06-04 NOTE — Progress Notes (Signed)
  Subjective:    Patient ID: Stephen [name], male    DOB: 1956-10-05, 56 y.o.   MRN: [id number]  HPI  a)  Gold stage 2 COPD - MZ phenotype. Unchanged PFTs 2009 -> Feb 2012. Feb 2012 PFTs - fev1 1.97L/57%, Ratio 78%  - clas 2 dyspnea, Rx with spiriva and symbicort  B)  Quit smoking 2006  C) Rt lung cancer 2006. Ex--throat cancer 2007. RLL 4mm nodule feb 2012-> resolved May 2012  -> Dr Shirline Frees D) Chronic sinus alllergies - Dr Winslow Callas, Dr. Jenne Pane.   - last allergy test fall 2012 - plan to increase shots and positive  E) Recc AECOPD  - Aug 2012 x 2 - Dec 2012 F) Osteoporosis per hx  OV 06/04/2012  Stable copd. Class 2 exertional dyspnea that is relieved at rest is at baseline. This frustrates him because he wants to do more. Not attended rehab; some snafu they never called him. No associated chest pain, worsening sputum, cough  Past, Family, Social reviewed: allergy test with DR Laneta Simmers - positive  Review of Systems  Constitutional: Negative for fever and unexpected weight change.  HENT: Positive for sneezing. Negative for ear pain, nosebleeds, congestion, sore throat, rhinorrhea, trouble swallowing, dental problem, postnasal drip and sinus pressure.   Eyes: Negative for redness and itching.  Respiratory: Positive for shortness of breath and wheezing. Negative for cough and chest tightness.   Cardiovascular: Negative for palpitations and leg swelling.  Gastrointestinal: Negative for nausea and vomiting.  Genitourinary: Negative for dysuria.  Musculoskeletal: Negative for joint swelling.  Skin: Negative for rash.  Neurological: Positive for headaches.  Hematological: Does not bruise/bleed easily.  Psychiatric/Behavioral: Positive for dysphoric mood. The patient is not nervous/anxious.        Objective:   Physical Exam  GEN: A/Ox3; pleasant , NAD,   HEENT:  [place]/AT,  EACs-clear, TMs-wnl, NOSE-clear, THROAT-clear, no lesions, no postnasal drip or exudate noted.   NECK:  Supple w/  fair ROM; no JVD; normal carotid impulses w/o bruits; no thyromegaly or nodules palpated; no lymphadenopathy.  RESP  Has scatterd Exp wheezes no accessory muscle use, no dullness to percussion. No crackles no respiratory distress  CARD:  RRR, no m/r/g  , no peripheral edema, pulses intact, no cyanosis or clubbing.  GI:   Soft & nt; nml bowel sounds; no organomegaly or masses detected.  Musco: Warm bil, no deformities or joint swelling noted.   Neuro: alert, no focal deficits noted.    Skin: Warm, no lesions or rashes              Assessment & Plan:

## 2012-06-08 ENCOUNTER — Ambulatory Visit (INDEPENDENT_AMBULATORY_CARE_PROVIDER_SITE_OTHER): Payer: 59 | Admitting: Internal Medicine

## 2012-06-08 ENCOUNTER — Telehealth: Payer: Self-pay | Admitting: Internal Medicine

## 2012-06-08 DIAGNOSIS — J449 Chronic obstructive pulmonary disease, unspecified: Secondary | ICD-10-CM

## 2012-06-08 LAB — PULMONARY FUNCTION TEST

## 2012-06-08 NOTE — Telephone Encounter (Signed)
No change in lung function; it is at baseline 56%. Please let him know  Thanks MR  For my perusal  Spirometry 06/08/12   - fev1 1.9L/56%, Ratio 46 and this is baseline and unchanged

## 2012-06-08 NOTE — Progress Notes (Signed)
Spirometry done today. 

## 2012-06-11 NOTE — Telephone Encounter (Signed)
Pt is aware. Amerigo Mcglory, CMA  

## 2012-09-14 ENCOUNTER — Other Ambulatory Visit (HOSPITAL_BASED_OUTPATIENT_CLINIC_OR_DEPARTMENT_OTHER): Payer: 59

## 2012-09-14 ENCOUNTER — Ambulatory Visit (HOSPITAL_COMMUNITY)
Admission: RE | Admit: 2012-09-14 | Discharge: 2012-09-14 | Disposition: A | Discharge status: Home / Self Care | Payer: 59 | Source: Ambulatory Visit | Attending: Internal Medicine | Admitting: Internal Medicine

## 2012-09-14 DIAGNOSIS — C3490 Malignant neoplasm of unspecified part of unspecified bronchus or lung: Secondary | ICD-10-CM

## 2012-09-14 LAB — CBC WITH DIFFERENTIAL/PLATELET
Basophils Absolute: 0.1 10*3/uL (ref 0.0–0.1)
Eosinophils Absolute: 0.2 10*3/uL (ref 0.0–0.5)
HCT: 43 % (ref 38.4–49.9)
HGB: 14.5 g/dL (ref 13.0–17.1)
LYMPH%: 15.1 % (ref 14.0–49.0)
MONO#: 0.6 10*3/uL (ref 0.1–0.9)
NEUT#: 3.7 10*3/uL (ref 1.5–6.5)
NEUT%: 69.6 % (ref 39.0–75.0)
Platelets: 140 10*3/uL (ref 140–400)
RBC: 5.2 10*6/uL (ref 4.20–5.82)
WBC: 5.4 10*3/uL (ref 4.0–10.3)

## 2012-09-14 LAB — COMPREHENSIVE METABOLIC PANEL (CC13)
BUN: 17 mg/dL (ref 7.0–26.0)
CO2: 24 mEq/L (ref 22–29)
Creatinine: 0.8 mg/dL (ref 0.7–1.3)
Glucose: 98 mg/dl (ref 70–99)
Total Bilirubin: 0.7 mg/dL (ref 0.20–1.20)

## 2012-09-14 MED ORDER — IOHEXOL 300 MG/ML  SOLN
80.0000 mL | Freq: Once | INTRAMUSCULAR | Status: AC | PRN
  Administered 2012-09-14: 80 mL via INTRAVENOUS

## 2012-09-16 ENCOUNTER — Telehealth: Payer: Self-pay | Admitting: Internal Medicine

## 2012-09-16 ENCOUNTER — Ambulatory Visit (HOSPITAL_BASED_OUTPATIENT_CLINIC_OR_DEPARTMENT_OTHER): Payer: 59 | Admitting: Internal Medicine

## 2012-09-16 VITALS — BP 138/88 | HR 66 | Temp 96.9°F | Resp 18 | Ht 73.5 in | Wt 190.3 lb

## 2012-09-16 DIAGNOSIS — C3410 Malignant neoplasm of upper lobe, unspecified bronchus or lung: Secondary | ICD-10-CM

## 2012-09-16 DIAGNOSIS — C320 Malignant neoplasm of glottis: Secondary | ICD-10-CM

## 2012-09-16 DIAGNOSIS — C3491 Malignant neoplasm of unspecified part of right bronchus or lung: Secondary | ICD-10-CM | POA: Insufficient documentation

## 2012-09-16 DIAGNOSIS — C3490 Malignant neoplasm of unspecified part of unspecified bronchus or lung: Secondary | ICD-10-CM | POA: Insufficient documentation

## 2012-09-16 NOTE — Progress Notes (Signed)
[facility] Telephone:([phone]   Fax:(336) [phone]  OFFICE PROGRESS NOTE  Ezequiel Kayser, MD [address] [place] [address]  PRINCIPAL DIAGNOSES:  1. Recurrent non-small cell lung cancer, presented with endobronchial lesion involving the right main stem bronchus diagnosed in March 2007.  2. History of stage IB non-small cell lung cancer diagnosed in May 2006.  3. 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  PRIOR THERAPY:  1. Status post right upper lobectomy on May 01, 2005 under the care of Dr. Edwyna Shell.  2. Status post laryngoscopy with CO2 laser excision of the right vocal cord lesion under the care of Dr. Jenne Pane in March 2007.  3. Status post concurrent chemoradiation with weekly carboplatin and paclitaxel. Last dose was given May 12, 2006.  4. Status post 3 cycles of consolidation chemotherapy with docetaxel. Last dose was given August 21, 2006.  CURRENT THERAPY: Observation.  INTERVAL HISTORY: Stephen [name] 56 y.o. male returns to the clinic today for routine six-month followup visit. The patient is feeling fine today with no specific complaints. He denied having any significant chest pain, shortness breath, cough or hemoptysis. He has no significant weight loss or night sweats. The patient has repeat CT scan of the chest performed recently and he is here today for evaluation and discussion of his scan results.  MEDICAL HISTORY: Past Medical History  Diagnosis Date  . Allergic rhinitis, cause unspecified   . Other emphysema   . Unspecified vitamin D deficiency   . Dyspnea   . Osteoporosis   . Thrombosed hemorrhoids   . Lung cancer     lung ca dx 06  . Throat cancer     throat ca dx 2007    ALLERGIES:  is allergic to codeine; cymbalta; and montelukast sodium.  MEDICATIONS:  Current Outpatient Prescriptions  Medication Sig Dispense Refill  . albuterol (PROVENTIL HFA;VENTOLIN HFA) 108 (90 BASE)  MCG/ACT inhaler Inhale 2 puffs into the lungs every 6 (six) hours as needed.        Marland Kitchen albuterol (PROVENTIL) (2.5 MG/3ML) 0.083% nebulizer solution Take 2.5 mg by nebulization every 6 (six) hours as needed.        Marland Kitchen alendronate (FOSAMAX) 70 MG tablet Take 70 mg by mouth every 7 (seven) days. Take with a full glass of water on an empty stomach.       Marland Kitchen aspirin 81 MG tablet Take 81 mg by mouth daily.        . budesonide-formoterol (SYMBICORT) 160-4.5 MCG/ACT inhaler Inhale 2 puffs into the lungs 2 (two) times daily. Sample x 3 days       . Cholecalciferol (VITAMIN D) 1000 UNITS capsule Take 1,000 Units by mouth daily.        Marland Kitchen guaiFENesin (MUCINEX) 600 MG 12 hr tablet Take 1,200 mg by mouth daily.        Marland Kitchen levothyroxine (SYNTHROID, LEVOTHROID) 100 MCG tablet Take 150 mcg by mouth daily.       . NON FORMULARY Allergy Injections weekly      . temazepam (RESTORIL) 30 MG capsule Take 30 mg by mouth at bedtime as needed.        . tiotropium (SPIRIVA) 18 MCG inhalation capsule Place 18 mcg into inhaler and inhale daily.        . Vitamin D, Ergocalciferol, (DRISDOL) 50000 UNITS CAPS Take 1 tablet by mouth Once every 2 weeks.  SURGICAL HISTORY:  Past Surgical History  Procedure Date  . Appendectomy   . Lung lobectomy     RUL  . Rotator cuff repair   . Knee surgery   . Shoulder surgery   . Throat surgery     Laser surgery for throat cancer    REVIEW OF SYSTEMS:  A comprehensive review of systems was negative.   PHYSICAL EXAMINATION: General appearance: alert, cooperative and no distress Head: Normocephalic, without obvious abnormality, atraumatic Lymph nodes: Cervical, supraclavicular, and axillary nodes normal. Resp: clear to auscultation bilaterally Cardio: regular rate and rhythm, S1, S2 normal, no murmur, click, rub or gallop GI: soft, non-tender; bowel sounds normal; no masses,  no organomegaly Extremities: extremities normal, atraumatic, no cyanosis or edema Neurologic: Alert and  oriented X 3, normal strength and tone. Normal symmetric reflexes. Normal coordination and gait  ECOG PERFORMANCE STATUS: 0 - Asymptomatic  Blood pressure 138/88, pulse 66, temperature 96.9 F (36.1 C), temperature source Oral, resp. rate 18, height 6' 1.5" (1.867 m), weight 190 lb 4.8 oz (86.32 kg).  LABORATORY DATA: Lab Results  Component Value Date   WBC 5.4 09/14/2012   HGB 14.5 09/14/2012   HCT 43.0 09/14/2012   MCV 82.7 09/14/2012   PLT 140 09/14/2012      Chemistry      Component Value Date/Time   NA 138 09/14/2012 0808   NA 142 03/11/2012 1125   NA 139 08/03/2010 0800   K 3.6 09/14/2012 0808   K 3.8 03/11/2012 1125   K 3.6 08/03/2010 0800   CL 106 09/14/2012 0808   CL 98 03/11/2012 1125   CL 106 08/03/2010 0800   CO2 24 09/14/2012 0808   CO2 30 03/11/2012 1125   CO2 27 08/03/2010 0800   BUN 17.0 09/14/2012 0808   BUN 18 03/11/2012 1125   BUN 13 08/03/2010 0800   CREATININE 0.8 09/14/2012 0808   CREATININE 1.0 03/11/2012 1125   CREATININE 0.98 08/03/2010 0800      Component Value Date/Time   CALCIUM 9.2 09/14/2012 0808   CALCIUM 9.0 03/11/2012 1125   CALCIUM 9.3 08/03/2010 0800   ALKPHOS 109 09/14/2012 0808   ALKPHOS 98* 03/11/2012 1125   ALKPHOS 128* 08/03/2010 0800   AST 25 09/14/2012 0808   AST 26 03/11/2012 1125   AST 19 08/03/2010 0800   ALT 27 09/14/2012 0808   ALT 17 08/03/2010 0800   BILITOT 0.70 09/14/2012 0808   BILITOT 0.80 03/11/2012 1125   BILITOT 0.8 08/03/2010 0800       RADIOGRAPHIC STUDIES: Ct Chest W Contrast  09/14/2012  *RADIOLOGY REPORT*  Clinical Data: Follow up lung cancer  CT CHEST WITH CONTRAST  Technique:  Multidetector CT imaging of the chest was performed following the standard protocol during bolus administration of intravenous contrast.  Contrast: 80mL OMNIPAQUE IOHEXOL 300 MG/ML  SOLN  Comparison: 03/11/2012  Findings:  No enlarged supraclavicular or axillary lymph nodes.  9 mm right paratracheal lymph node is unchanged, image number 13.  Again noted are  postoperative changes and volume loss involving the right hemithorax.  There is scarring, bronchiectasis and consolidation within the right upper lobe which is stable from previous exam.  Central areas of low attenuation are again noted within this area which may represent necrosis.  This is unchanged from previous exam.  Changes of emphysema are noted in both lungs. Anterior right midlung nodule measures 3.8 mm, image 28.  This is stable from previous exam.  Review of the visualized bony  structures demonstrates no aggressive lytic or sclerotic bone lesions.  Lucent structure within the L1 vertebra is identified, image 62.  Stable from previous exam. Favored to represent a hemangioma.  Previously referenced gastrohepatic ligament lymph node measures 0.7 cm, image 57.  Unchanged.  IMPRESSION:  1.  Stable appearance of the right lung with consolidation and fibrosis as well as central area of low attenuation.  Findings may be consistent with changes from external beam radiation. 2.  9 mm right paratracheal lymph node is unchanged from prior study.   Original Report Authenticated By: Rosealee Albee, M.D.     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 The patient has been observation since August of 2007 with no evidence for disease progression.  PLAN: I discussed the scan results with the patient. I recommended for him to continue on observation for now. I would see him back for followup visit in one year with repeat CT scan of the chest.  The patient was advised to call me immediately if he has any concerning symptoms in the interval.  All questions were answered. The patient knows to call the clinic with any problems, questions or concerns. We can certainly see the patient much sooner if  necessary.

## 2012-09-16 NOTE — Patient Instructions (Signed)
Scan of the chest showed no evidence for disease recurrence. Followup in one year with repeat CT scan of the chest. Please call if you have any questions in the interval

## 2012-09-16 NOTE — Telephone Encounter (Signed)
appts made and printed for pt aom °

## 2013-02-26 ENCOUNTER — Telehealth: Payer: Self-pay | Admitting: Internal Medicine

## 2013-02-26 NOTE — Telephone Encounter (Signed)
Called and spoke to pt on 02/26/13 to make next ov per recall.  Pt stated right now is not a good time for him to make an appt.  He will call us when he can come in. Leanora Ivanoff

## 2013-05-21 ENCOUNTER — Encounter: Payer: Self-pay | Admitting: Gastroenterology

## 2013-08-08 ENCOUNTER — Observation Stay (HOSPITAL_COMMUNITY)
Admission: EM | Admit: 2013-08-08 | Discharge: 2013-08-09 | Disposition: A | Discharge status: Home / Self Care | Payer: 59 | Attending: Internal Medicine | Admitting: Internal Medicine

## 2013-08-08 ENCOUNTER — Encounter (HOSPITAL_COMMUNITY): Payer: Self-pay | Admitting: *Deleted

## 2013-08-08 ENCOUNTER — Emergency Department (HOSPITAL_COMMUNITY): Payer: 59

## 2013-08-08 DIAGNOSIS — K645 Perianal venous thrombosis: Secondary | ICD-10-CM

## 2013-08-08 DIAGNOSIS — M549 Dorsalgia, unspecified: Secondary | ICD-10-CM

## 2013-08-08 DIAGNOSIS — J309 Allergic rhinitis, unspecified: Secondary | ICD-10-CM

## 2013-08-08 DIAGNOSIS — Z8673 Personal history of transient ischemic attack (TIA), and cerebral infarction without residual deficits: Secondary | ICD-10-CM

## 2013-08-08 DIAGNOSIS — R079 Chest pain, unspecified: Secondary | ICD-10-CM

## 2013-08-08 DIAGNOSIS — J449 Chronic obstructive pulmonary disease, unspecified: Secondary | ICD-10-CM | POA: Diagnosis present

## 2013-08-08 DIAGNOSIS — R072 Precordial pain: Principal | ICD-10-CM | POA: Insufficient documentation

## 2013-08-08 DIAGNOSIS — J438 Other emphysema: Secondary | ICD-10-CM | POA: Insufficient documentation

## 2013-08-08 DIAGNOSIS — C3491 Malignant neoplasm of unspecified part of right bronchus or lung: Secondary | ICD-10-CM | POA: Diagnosis present

## 2013-08-08 DIAGNOSIS — C3490 Malignant neoplasm of unspecified part of unspecified bronchus or lung: Secondary | ICD-10-CM | POA: Diagnosis present

## 2013-08-08 DIAGNOSIS — Z85118 Personal history of other malignant neoplasm of bronchus and lung: Secondary | ICD-10-CM | POA: Insufficient documentation

## 2013-08-08 DIAGNOSIS — J4489 Other specified chronic obstructive pulmonary disease: Secondary | ICD-10-CM

## 2013-08-08 DIAGNOSIS — H6692 Otitis media, unspecified, left ear: Secondary | ICD-10-CM

## 2013-08-08 LAB — POCT I-STAT TROPONIN I: Troponin i, poc: 0.01 ng/mL (ref 0.00–0.08)

## 2013-08-08 LAB — CBC WITH DIFFERENTIAL/PLATELET
Basophils Relative: 1 % (ref 0–1)
Eosinophils Absolute: 0.3 10*3/uL (ref 0.0–0.7)
HCT: 40.7 % (ref 39.0–52.0)
Hemoglobin: 14.7 g/dL (ref 13.0–17.0)
Lymphs Abs: 1.2 10*3/uL (ref 0.7–4.0)
MCH: 30.2 pg (ref 26.0–34.0)
MCHC: 36.1 g/dL — ABNORMAL HIGH (ref 30.0–36.0)
Monocytes Absolute: 0.8 10*3/uL (ref 0.1–1.0)
Monocytes Relative: 11 % (ref 3–12)
Neutro Abs: 4.7 10*3/uL (ref 1.7–7.7)

## 2013-08-08 LAB — COMPREHENSIVE METABOLIC PANEL
Albumin: 4.1 g/dL (ref 3.5–5.2)
BUN: 17 mg/dL (ref 6–23)
Chloride: 104 mEq/L (ref 96–112)
Creatinine, Ser: 0.8 mg/dL (ref 0.50–1.35)
GFR calc Af Amer: >90 mL/min (ref >90)
Glucose, Bld: 102 mg/dL — ABNORMAL HIGH (ref 70–99)
Total Bilirubin: 0.5 mg/dL (ref 0.3–1.2)

## 2013-08-08 LAB — ETHANOL: Alcohol, Ethyl (B): <11 mg/dL (ref 0–11)

## 2013-08-08 NOTE — ED Notes (Signed)
Patient returned from X-ray 

## 2013-08-08 NOTE — ED Notes (Signed)
Pt states CP that started last night pt vomited with the CP last night. Pt no longer have CP. Pt states that the pain radiates up his left side of his neck and down his left arm, but his chest has stopped hurting. Pt took 324mg  of aspirin today.

## 2013-08-09 ENCOUNTER — Encounter (HOSPITAL_COMMUNITY): Payer: Self-pay | Admitting: *Deleted

## 2013-08-09 DIAGNOSIS — C3490 Malignant neoplasm of unspecified part of unspecified bronchus or lung: Secondary | ICD-10-CM

## 2013-08-09 DIAGNOSIS — J449 Chronic obstructive pulmonary disease, unspecified: Secondary | ICD-10-CM

## 2013-08-09 DIAGNOSIS — M549 Dorsalgia, unspecified: Secondary | ICD-10-CM

## 2013-08-09 DIAGNOSIS — H6692 Otitis media, unspecified, left ear: Secondary | ICD-10-CM

## 2013-08-09 DIAGNOSIS — Z8673 Personal history of transient ischemic attack (TIA), and cerebral infarction without residual deficits: Secondary | ICD-10-CM

## 2013-08-09 DIAGNOSIS — R079 Chest pain, unspecified: Secondary | ICD-10-CM | POA: Diagnosis present

## 2013-08-09 MED ORDER — SODIUM CHLORIDE 0.9 % IV SOLN: 250.0000 mL | INTRAVENOUS | Status: DC | PRN

## 2013-08-09 MED ORDER — NITROGLYCERIN 0.4 MG SL SUBL: 0.4000 mg | SUBLINGUAL_TABLET | SUBLINGUAL | Status: DC | PRN

## 2013-08-09 MED ORDER — ALBUTEROL SULFATE (5 MG/ML) 0.5% IN NEBU: 2.5000 mg | INHALATION_SOLUTION | RESPIRATORY_TRACT | Status: DC | PRN

## 2013-08-09 MED ORDER — SODIUM CHLORIDE 0.9 % IJ SOLN: 3.0000 mL | Freq: Two times a day (BID) | INTRAMUSCULAR | Status: DC

## 2013-08-09 MED ORDER — ONDANSETRON HCL 4 MG PO TABS: 4.0000 mg | ORAL_TABLET | Freq: Four times a day (QID) | ORAL | Status: DC | PRN

## 2013-08-09 MED ORDER — SODIUM CHLORIDE 0.9 % IJ SOLN: 3.0000 mL | INTRAMUSCULAR | Status: DC | PRN

## 2013-08-09 MED ORDER — NITROGLYCERIN 0.4 MG SL SUBL: 0.4000 mg | SUBLINGUAL_TABLET | SUBLINGUAL | Status: AC | PRN

## 2013-08-09 MED ORDER — ACTIVE PARTNERSHIP FOR HEALTH OF YOUR HEART BOOK
Freq: Once | Status: AC
  Administered 2013-08-09: 06:00:00
  Filled 2013-08-09: qty 1

## 2013-08-09 MED ORDER — TIOTROPIUM BROMIDE MONOHYDRATE 18 MCG IN CAPS
18.0000 ug | ORAL_CAPSULE | Freq: Every day | RESPIRATORY_TRACT | Status: DC
  Filled 2013-08-09: qty 5

## 2013-08-09 MED ORDER — TEMAZEPAM 15 MG PO CAPS
30.0000 mg | ORAL_CAPSULE | Freq: Every evening | ORAL | Status: DC | PRN
  Administered 2013-08-09: 30 mg via ORAL
  Filled 2013-08-09: qty 2

## 2013-08-09 MED ORDER — ASPIRIN EC 81 MG PO TBEC
81.0000 mg | DELAYED_RELEASE_TABLET | Freq: Every day | ORAL | Status: DC
  Administered 2013-08-09: 81 mg via ORAL
  Filled 2013-08-09: qty 1

## 2013-08-09 MED ORDER — AMOXICILLIN 500 MG PO CAPS: 500.0000 mg | ORAL_CAPSULE | Freq: Two times a day (BID) | ORAL | Status: DC

## 2013-08-09 MED ORDER — ONDANSETRON HCL 4 MG/2ML IJ SOLN: 4.0000 mg | Freq: Four times a day (QID) | INTRAMUSCULAR | Status: DC | PRN

## 2013-08-09 MED ORDER — LEVOTHYROXINE SODIUM 125 MCG PO TABS
125.0000 ug | ORAL_TABLET | Freq: Every day | ORAL | Status: DC
  Administered 2013-08-09: 125 ug via ORAL
  Filled 2013-08-09 (×2): qty 1

## 2013-08-09 NOTE — H&P (Signed)
PCP:   Ezequiel Kayser, MD   Chief Complaint:  cp  HPI: 57 yo male h/o tia, copd, lung cancer in remission comes in with 2 episodes of sscp radiating to left jaw today.  First was at 230p lasted less than 10 min relieved with 4 baby asa.  Then again around 830p lasted several hours came to ED.  Relieved on its own.  Was suppose to get outpt stress testing but has not been done yet.  No coughing.  No fevers.  No le edema or swelling.  No neurological symptoms.  No n/v.  No sob.  feelinig back to normal now no cp.  Review of Systems:  Positive and negative as per HPI otherwise all other systems are negative  Past Medical History: Past Medical History  Diagnosis Date  . Allergic rhinitis, cause unspecified   . Other emphysema   . Unspecified vitamin D deficiency   . Dyspnea   . Osteoporosis   . Thrombosed hemorrhoids   . Lung cancer     lung ca dx 06  . Throat cancer     throat ca dx 2007   Past Surgical History  Procedure Laterality Date  . Appendectomy    . Lung lobectomy      RUL  . Rotator cuff repair    . Knee surgery    . Shoulder surgery    . Throat surgery      Laser surgery for throat cancer    Medications: Prior to Admission medications   Medication Sig Start Date End Date Taking? Authorizing Provider  albuterol (PROVENTIL HFA;VENTOLIN HFA) 108 (90 BASE) MCG/ACT inhaler Inhale 2 puffs into the lungs every 6 (six) hours as needed.     Yes Historical Provider, MD  albuterol (PROVENTIL) (2.5 MG/3ML) 0.083% nebulizer solution Take 2.5 mg by nebulization every 6 (six) hours as needed.     Yes Historical Provider, MD  alendronate (FOSAMAX) 70 MG tablet Take 70 mg by mouth every 7 (seven) days. Take with a full glass of water on an empty stomach. On Mondays   Yes Historical Provider, MD  aspirin 81 MG tablet Take 81 mg by mouth daily.     Yes Historical Provider, MD  budesonide-formoterol (SYMBICORT) 160-4.5 MCG/ACT inhaler Inhale 2 puffs into the lungs 2 (two) times  daily. Sample x 3 days    Yes Historical Provider, MD  levothyroxine (SYNTHROID, LEVOTHROID) 125 MCG tablet Take 125 mcg by mouth daily before breakfast.   Yes Historical Provider, MD  pravastatin (PRAVACHOL) 40 MG tablet Take 40 mg by mouth every evening.    Yes Historical Provider, MD  temazepam (RESTORIL) 30 MG capsule Take 30 mg by mouth at bedtime as needed for sleep.   Yes Historical Provider, MD  tiotropium (SPIRIVA) 18 MCG inhalation capsule Place 18 mcg into inhaler and inhale daily.     Yes Historical Provider, MD  Vitamin D, Ergocalciferol, (DRISDOL) 50000 UNITS CAPS Take 1 tablet by mouth Once every 2 weeks. 05/07/11  Yes Historical Provider, MD    Allergies:   Allergies  Allergen Reactions  . Codeine Other (See Comments)    unknown  . Cymbalta (Duloxetine Hcl) Nausea And Vomiting  . Montelukast Sodium Other (See Comments)    Causes sinusitis    Social History:  reports that he quit smoking about 8 years ago. His smoking use included Cigarettes. He has a 48 pack-year smoking history. He has never used smokeless tobacco. He reports that  drinks alcohol. He reports that  he does not use illicit drugs.  Family History: Family History  Problem Relation Age of Onset  . Cancer Mother     Brain tumor  . COPD Mother   . Cystic fibrosis Sister     Physical Exam: Filed Vitals:   08/08/13 2315 08/09/13 0015 08/09/13 0115 08/09/13 0145  BP: 123/82 105/83 123/87 117/94  Pulse: 66 75 68 71  Temp:      TempSrc:      Resp: 17 21 23 20   SpO2: 97% 95% 98% 99%   General appearance: alert, cooperative and no distress Head: Normocephalic, without obvious abnormality, atraumatic Eyes: negative Neck: no JVD and supple, symmetrical, trachea midline Lungs: clear to auscultation bilaterally Heart: regular rate and rhythm, S1, S2 normal, no murmur, click, rub or gallop Abdomen: soft, non-tender; bowel sounds normal; no masses,  no organomegaly Extremities: extremities normal,  atraumatic, no cyanosis or edema Pulses: 2+ and symmetric Skin: Skin color, texture, turgor normal. No rashes or lesions Neurologic: Grossly normal    Labs on Admission:   Recent Labs  08/08/13 2123  NA 141  K 3.6  CL 104  CO2 26  GLUCOSE 102*  BUN 17  CREATININE 0.80  CALCIUM 9.3    Recent Labs  08/08/13 2123  AST 29  ALT 25  ALKPHOS 95  BILITOT 0.5  PROT 7.6  ALBUMIN 4.1    Recent Labs  08/08/13 2123  WBC 7.0  NEUTROABS 4.7  HGB 14.7  HCT 40.7  MCV 83.6  PLT 181    Radiological Exams on Admission: Dg Chest 2 View  08/08/2013   *RADIOLOGY REPORT*  Clinical Data: Right-sided chest pain.  History of lung cancer.  CHEST - 2 VIEW  Comparison: Chest x-ray 08/20/2011.  Findings: Postoperative changes of right upper lobectomy are again noted with extensive architectural distortion in the apex of the right hemithorax and chronic right-sided volume loss with left to right shift of cardiomediastinal structures.  No definite suspicious appearing pulmonary nodules or masses are identified at this time.  No acute consolidative airspace disease.  No definite pleural effusions.  Heart size is normal.  Mediastinal contours remain distorted. Post thoracotomy changes in the right chest wall.  IMPRESSION: 1.  No radiographic evidence of acute cardiopulmonary disease.  The appearance of the chest is unchanged compared to prior chest x-ray 08/20/2011, as above.   Original Report Authenticated By: Trudie Reed, M.D.    Assessment/Plan  57 yo male with chest pain with multiple risk factors. Principal Problem:   Chest pain Active Problems:   C O P D   Malignant neoplasm of bronchus and lung, unspecified site   History of TIA (transient ischemic attack)  obs romi.  Ck echo in am.  Asa.  Tele.  Full code.  ekg nsr no acute changes.  Ivanna Kocak A 08/09/2013, 2:22 AM

## 2013-08-09 NOTE — Discharge Summary (Signed)
Physician Discharge Summary  Stephen [name]:[id number] DOB: October 01, 1956 DOA: 08/08/2013  PCP: Ezequiel Kayser, MD  Admit date: 08/08/2013 Discharge date: 08/09/2013  Recommendations for Outpatient Follow-up:  1. Referred back to Dr. Rennis Golden for outpatient stress test  Discharge Diagnoses:  Principal Problem:   Chest pain Active Problems:   C O P D   Malignant neoplasm of bronchus and lung, unspecified site   History of TIA (transient ischemic attack)   Otitis media of left ear  Discharge Condition: stable  Filed Weights   08/09/13 0230  Weight: 93.441 kg (206 lb)   History of present illness:  Admitted with Chest pain.  Ruled out for MI, Echo showed normal EF and no wall motion abnormalities.  Patient requesting discharge.  Discussed the case with PCP, who reports patient has seen Dr. Rennis Golden in the past, but never had stress test.    Also, at discharge, c/o left ear pain.  Exam shows mild erythema, so will start on amoxicillin for mild otitis media.  Discharge Exam: Filed Vitals:   08/09/13 0412  BP: 129/83  Pulse: 71  Temp: 97.9 F (36.6 C)  Resp: 19    General: comfortable HEENT: left TM with mild erythema, no bulging or pus Cardiovascular: RRR Respiratory: CTA  Discharge Instructions  Discharge Orders   Future Appointments Provider Department Dept Phone   09/14/2013 8:15 AM Dava Najjar [facility] [phone]   09/14/2013 9:00 AM [facility] [phone]   Patient to arrive 15 minutes prior to appointment time. Patient to pick up oral contrast at least 1 day prior to exam, unless otherwise instructed by your physician. No solid food 4 hours prior to exam. Liquids and Medicines are okay.   09/16/2013 9:30 AM Si Gaul, MD [facility] [phone]   Future Orders Complete By Expires     Activity as tolerated - No restrictions  As directed     Diet - low  sodium heart healthy  As directed     Diet - low sodium heart healthy  As directed         Medication List         albuterol 108 (90 BASE) MCG/ACT inhaler  Commonly known as:  PROVENTIL HFA;VENTOLIN HFA  Inhale 2 puffs into the lungs every 6 (six) hours as needed.     albuterol (2.5 MG/3ML) 0.083% nebulizer solution  Commonly known as:  PROVENTIL  Take 2.5 mg by nebulization every 6 (six) hours as needed.     alendronate 70 MG tablet  Commonly known as:  FOSAMAX  Take 70 mg by mouth every 7 (seven) days. Take with a full glass of water on an empty stomach. On Mondays     amoxicillin 500 MG capsule  Commonly known as:  AMOXIL  Take 1 capsule (500 mg total) by mouth 2 (two) times daily.     aspirin 81 MG tablet  Take 81 mg by mouth daily.     budesonide-formoterol 160-4.5 MCG/ACT inhaler  Commonly known as:  SYMBICORT  Inhale 2 puffs into the lungs 2 (two) times daily. Sample x 3 days     levothyroxine 125 MCG tablet  Commonly known as:  SYNTHROID, LEVOTHROID  Take 125 mcg by mouth daily before breakfast.     nitroGLYCERIN 0.4 MG SL tablet  Commonly known as:  NITROSTAT  Place 1 tablet (0.4 mg total) under the tongue every 5 (five) minutes as needed  for chest pain.     pravastatin 40 MG tablet  Commonly known as:  PRAVACHOL  Take 40 mg by mouth every evening.     temazepam 30 MG capsule  Commonly known as:  RESTORIL  Take 30 mg by mouth at bedtime as needed for sleep.     tiotropium 18 MCG inhalation capsule  Commonly known as:  SPIRIVA  Place 18 mcg into inhaler and inhale daily.     Vitamin D (Ergocalciferol) 50000 UNITS Caps capsule  Commonly known as:  DRISDOL  Take 1 tablet by mouth Once every 2 weeks.       Allergies  Allergen Reactions  . Codeine Other (See Comments)    unknown  . Cymbalta (Duloxetine Hcl) Nausea And Vomiting  . Montelukast Sodium Other (See Comments)    Causes sinusitis       Follow-up Information   Follow up with  Chrystie Nose, MD. (FOR STRESS TEST)    Contact information:   [address] [place] [address] [phone]        The results of significant diagnostics from this hospitalization (including imaging, microbiology, ancillary and laboratory) are listed below for reference.    Significant Diagnostic Studies: Dg Chest 2 View  08/08/2013   *RADIOLOGY REPORT*  Clinical Data: Right-sided chest pain.  History of lung cancer.  CHEST - 2 VIEW  Comparison: Chest x-ray 08/20/2011.  Findings: Postoperative changes of right upper lobectomy are again noted with extensive architectural distortion in the apex of the right hemithorax and chronic right-sided volume loss with left to right shift of cardiomediastinal structures.  No definite suspicious appearing pulmonary nodules or masses are identified at this time.  No acute consolidative airspace disease.  No definite pleural effusions.  Heart size is normal.  Mediastinal contours remain distorted. Post thoracotomy changes in the right chest wall.  IMPRESSION: 1.  No radiographic evidence of acute cardiopulmonary disease.  The appearance of the chest is unchanged compared to prior chest x-ray 08/20/2011, as above.   Original Report Authenticated By: Trudie Reed, M.D.    Microbiology: No results found for this or any previous visit (from the past 240 hour(s)).   Labs: Basic Metabolic Panel:  Recent Labs Lab 08/08/13 2123  NA 141  K 3.6  CL 104  CO2 26  GLUCOSE 102*  BUN 17  CREATININE 0.80  CALCIUM 9.3   Liver Function Tests:  Recent Labs Lab 08/08/13 2123  AST 29  ALT 25  ALKPHOS 95  BILITOT 0.5  PROT 7.6  ALBUMIN 4.1   No results found for this basename: LIPASE, AMYLASE,  in the last 168 hours No results found for this basename: AMMONIA,  in the last 168 hours CBC:  Recent Labs Lab 08/08/13 2123  WBC 7.0  NEUTROABS 4.7  HGB 14.7  HCT 40.7  MCV 83.6  PLT 181   Cardiac Enzymes:  Recent Labs Lab  08/09/13 0650  TROPONINI <0.30   BNP: BNP (last 3 results) No results found for this basename: PROBNP,  in the last 8760 hours CBG: No results found for this basename: GLUCAP,  in the last 168 hours  EKG: Normal sinus rhythm with sinus arrhythmia Nonspecific ST abnormality  Signed:  Benjimen Kelley L  [facility] 08/09/2013, 4:39 PM

## 2013-08-09 NOTE — Progress Notes (Signed)
Echo Lab  2D Echocardiogram completed.  Paz Winsett L Dejanay Wamboldt, RDCS 08/09/2013 2:30 PM

## 2013-08-09 NOTE — Progress Notes (Signed)
Pt/family given discharge instructions, medication lists, follow up appointments, and when to call the doctor.  Pt/family verbalizes understanding. Jennings Stirling McClintock    

## 2013-08-09 NOTE — ED Provider Notes (Signed)
CSN: [id number]     Arrival date & time 08/08/13  2104 History     First MD Initiated Contact with Patient 08/08/13 2313     Chief Complaint  Patient presents with  . Chest Pain   (Consider location/radiation/quality/duration/timing/severity/associated sxs/prior Treatment) HPI This is a 57 year old male with a history of COPD and lung cancer. He has been cancer free for 6 years. He is here with 2 episodes of pain yesterday. The episodes occurred spontaneously without apparent trigger area and the pain is located in the left side of his neck with some radiation to his pectoral region. The pain was mild to moderate. He describes it as feeling like "something is wrong with an artery". It was accompanied by paresthesias in his left arm and fingers. There was no associated change in his baseline shortness of breath. He denies diaphoresis or nausea with it. Each episode lasted about an hour and a half and resolved spontaneously.   Past Medical History  Diagnosis Date  . Allergic rhinitis, cause unspecified   . Other emphysema   . Unspecified vitamin D deficiency   . Dyspnea   . Osteoporosis   . Thrombosed hemorrhoids   . Lung cancer     lung ca dx 06  . Throat cancer     throat ca dx 2007   Past Surgical History  Procedure Laterality Date  . Appendectomy    . Lung lobectomy      RUL  . Rotator cuff repair    . Knee surgery    . Shoulder surgery    . Throat surgery      Laser surgery for throat cancer   Family History  Problem Relation Age of Onset  . Cancer Mother     Brain tumor  . COPD Mother   . Cystic fibrosis Sister    History  Substance Use Topics  . Smoking status: Former Smoker -- 1.50 packs/day for 32 years    Types: Cigarettes    Quit date: 12/30/2004  . Smokeless tobacco: Never Used     Comment: 1 ppd x 30 years  . Alcohol Use: Yes     Comment: rare    Review of Systems  All other systems reviewed and are negative.    Allergies  Codeine; Cymbalta;  and Montelukast sodium  Home Medications   Current Outpatient Rx  Name  Route  Sig  Dispense  Refill  . albuterol (PROVENTIL HFA;VENTOLIN HFA) 108 (90 BASE) MCG/ACT inhaler   Inhalation   Inhale 2 puffs into the lungs every 6 (six) hours as needed.           Marland Kitchen albuterol (PROVENTIL) (2.5 MG/3ML) 0.083% nebulizer solution   Nebulization   Take 2.5 mg by nebulization every 6 (six) hours as needed.           Marland Kitchen alendronate (FOSAMAX) 70 MG tablet   Oral   Take 70 mg by mouth every 7 (seven) days. Take with a full glass of water on an empty stomach. On Mondays         . aspirin 81 MG tablet   Oral   Take 81 mg by mouth daily.           . budesonide-formoterol (SYMBICORT) 160-4.5 MCG/ACT inhaler   Inhalation   Inhale 2 puffs into the lungs 2 (two) times daily. Sample x 3 days          . levothyroxine (SYNTHROID, LEVOTHROID) 125 MCG tablet   Oral  Take 125 mcg by mouth daily before breakfast.         . pravastatin (PRAVACHOL) 40 MG tablet   Oral   Take 40 mg by mouth every evening.          . temazepam (RESTORIL) 30 MG capsule   Oral   Take 30 mg by mouth at bedtime as needed for sleep.         Marland Kitchen tiotropium (SPIRIVA) 18 MCG inhalation capsule   Inhalation   Place 18 mcg into inhaler and inhale daily.           . Vitamin D, Ergocalciferol, (DRISDOL) 50000 UNITS CAPS   Oral   Take 1 tablet by mouth Once every 2 weeks.          BP 135/93  Pulse 70  Temp(Src) 98.1 F (36.7 C) (Oral)  Resp 16  SpO2 100%  Physical Exam General: Well-developed, well-nourished male in no acute distress; appearance consistent with age of record HENT: normocephalic, atraumatic Eyes: pupils equal round and reactive to light; extraocular muscles intact Neck: supple Heart: regular rate and rhythm; distant sound Lungs: Faint expiratory wheezes; pursed-lip expirations Abdomen: soft; nondistended; nontender; bowel sounds present Extremities: No deformity; full range of  motion; pulses normal; no edema Neurologic: Awake, alert and oriented; motor function intact in all extremities and symmetric; no facial droop Skin: Warm and dry Psychiatric: Normal mood and affect    ED Course   Procedures (including critical care time)    MDM   Nursing notes and vitals signs, including pulse oximetry, reviewed.  Summary of this visit's results, reviewed by myself:  Labs:  Results for orders placed during the hospital encounter of 08/08/13 (from the past 24 hour(s))  CBC WITH DIFFERENTIAL     Status: Abnormal   Collection Time    08/08/13  9:23 PM      Result Value Range   WBC 7.0  4.0 - 10.5 K/uL   RBC 4.87  4.22 - 5.81 MIL/uL   Hemoglobin 14.7  13.0 - 17.0 g/dL   HCT 09.8  11.9 - 14.7 %   MCV 83.6  78.0 - 100.0 fL   MCH 30.2  26.0 - 34.0 pg   MCHC 36.1 (*) 30.0 - 36.0 g/dL   RDW 82.9  56.2 - 13.0 %   Platelets 181  150 - 400 K/uL   Neutrophils Relative % 67  43 - 77 %   Neutro Abs 4.7  1.7 - 7.7 K/uL   Lymphocytes Relative 16  12 - 46 %   Lymphs Abs 1.2  0.7 - 4.0 K/uL   Monocytes Relative 11  3 - 12 %   Monocytes Absolute 0.8  0.1 - 1.0 K/uL   Eosinophils Relative 4  0 - 5 %   Eosinophils Absolute 0.3  0.0 - 0.7 K/uL   Basophils Relative 1  0 - 1 %   Basophils Absolute 0.1  0.0 - 0.1 K/uL  COMPREHENSIVE METABOLIC PANEL     Status: Abnormal   Collection Time    08/08/13  9:23 PM      Result Value Range   Sodium 141  135 - 145 mEq/L   Potassium 3.6  3.5 - 5.1 mEq/L   Chloride 104  96 - 112 mEq/L   CO2 26  19 - 32 mEq/L   Glucose, Bld 102 (*) 70 - 99 mg/dL   BUN 17  6 - 23 mg/dL   Creatinine, Ser 8.65  0.50 - 1.35  mg/dL   Calcium 9.3  8.4 - 16.1 mg/dL   Total Protein 7.6  6.0 - 8.3 g/dL   Albumin 4.1  3.5 - 5.2 g/dL   AST 29  0 - 37 U/L   ALT 25  0 - 53 U/L   Alkaline Phosphatase 95  39 - 117 U/L   Total Bilirubin 0.5  0.3 - 1.2 mg/dL   GFR calc non Af Amer >90  >90 mL/min   GFR calc Af Amer >90  >90 mL/min  POCT I-STAT TROPONIN I      Status: None   Collection Time    08/08/13  9:27 PM      Result Value Range   Troponin i, poc 0.01  0.00 - 0.08 ng/mL   Comment 3           ETHANOL     Status: None   Collection Time    08/08/13 11:27 PM      Result Value Range   Alcohol, Ethyl (B) <11  0 - 11 mg/dL    Imaging Studies: Dg Chest 2 View  08/08/2013   *RADIOLOGY REPORT*  Clinical Data: Right-sided chest pain.  History of lung cancer.  CHEST - 2 VIEW  Comparison: Chest x-ray 08/20/2011.  Findings: Postoperative changes of right upper lobectomy are again noted with extensive architectural distortion in the apex of the right hemithorax and chronic right-sided volume loss with left to right shift of cardiomediastinal structures.  No definite suspicious appearing pulmonary nodules or masses are identified at this time.  No acute consolidative airspace disease.  No definite pleural effusions.  Heart size is normal.  Mediastinal contours remain distorted. Post thoracotomy changes in the right chest wall.  IMPRESSION: 1.  No radiographic evidence of acute cardiopulmonary disease.  The appearance of the chest is unchanged compared to prior chest x-ray 08/20/2011, as above.   Original Report Authenticated By: Trudie Reed, M.D.    EKG Interpretation:  Date & Time: 08/08/2013 9:10 AM  Rate: 78  Rhythm: normal sinus rhythm and sinus arrhythmia  QRS Axis: normal  Intervals: normal  ST/T Wave abnormalities: nonspecific ST changes  Conduction Disutrbances:none  Narrative Interpretation:   Old EKG Reviewed: PACs noted previously  12:47 AM We'll have patient admitted for stress test. He had previously been advised to have a stress test by [facility] but declined.    Hanley Seamen, MD 08/09/13 [phone]

## 2013-08-11 ENCOUNTER — Encounter: Payer: Self-pay | Admitting: *Deleted

## 2013-08-13 ENCOUNTER — Encounter: Payer: Self-pay | Admitting: Internal Medicine

## 2013-08-13 ENCOUNTER — Ambulatory Visit (INDEPENDENT_AMBULATORY_CARE_PROVIDER_SITE_OTHER): Payer: 59 | Admitting: Internal Medicine

## 2013-08-13 VITALS — BP 118/62 | HR 88 | Ht 70.0 in | Wt 205.4 lb

## 2013-08-13 DIAGNOSIS — R079 Chest pain, unspecified: Secondary | ICD-10-CM

## 2013-08-13 DIAGNOSIS — J449 Chronic obstructive pulmonary disease, unspecified: Secondary | ICD-10-CM

## 2013-08-13 DIAGNOSIS — M75120 Complete rotator cuff tear or rupture of unspecified shoulder, not specified as traumatic: Secondary | ICD-10-CM

## 2013-08-13 DIAGNOSIS — C3490 Malignant neoplasm of unspecified part of unspecified bronchus or lung: Secondary | ICD-10-CM

## 2013-08-13 NOTE — Patient Instructions (Addendum)
Your physician has requested that you have a lexiscan myoview. For further information please visit [email or web address] Please follow instruction sheet, as given.  Your physician recommends that you schedule a follow-up appointment in: 1-2 weeks, after your stress test.

## 2013-08-14 ENCOUNTER — Encounter: Payer: Self-pay | Admitting: Internal Medicine

## 2013-08-14 NOTE — Progress Notes (Signed)
OFFICE NOTE  Chief Complaint:  Chest pain, left arm pain  Primary Care Physician: Ezequiel Kayser, MD  HPI:  Stephen [name] is a 57 year old gentleman with a history of lung, throat and right airway cancer status post radiation, partial resection, partial pneumonectomy in the distant past as well as a strong history of smoking, COPD, hypertension, dyslipidemia. Recently, he had an episode of what sounds like transient binocular visual field loss mostly with loss of peripheral vision which lasted about 5 minutes. Symptoms resolved on their own. He was referred to [place] for evaluation of that as well as some symptoms of chest pain he has been having which is somewhat sharp, occasionally radiates to his left arm causing some numbness and tingling in that arm. He does report being under a significant amount of stress recently more than typical and he feels that that is most likely the cause of his symptoms. He has numerous cardiac risk factors including a history of COPD which is long-standing and smoking as well as partial lobe resection and lung cancer status post chest wall radiation, which can increase his risk for development of coronary disease. At his last office visit I recommended stress testing but he politely declined. Recently presented to the hospital with another episode of left arm pain and chest heaviness that went up to his stroke. He took some aspirin and was seen at local fire department who recommended he come to the emergency department. He was admitted and ruled out and had an echocardiogram which showed no significant abnormalities. Returns today to discuss possible cardiac stress test.  PMHx:  Past Medical History  Diagnosis Date  . Allergic rhinitis, cause unspecified   . Other emphysema     copd  . Unspecified vitamin D deficiency   . Dyspnea   . Osteoporosis   . Thrombosed hemorrhoids   . Lung cancer     lung ca dx 06  . Throat cancer     throat ca dx 2007  .  Blindness of left eye   . Hypothyroidism     Past Surgical History  Procedure Laterality Date  . Appendectomy    . Lung lobectomy      RUL  . Rotator cuff repair    . Knee surgery    . Shoulder surgery    . Throat surgery      Laser surgery for throat cancer  . Transthoracic echocardiogram  01/13/2012    EF=>55%; trace MR/TR;     FAMHx:  Family History  Problem Relation Age of Onset  . Cancer Mother     Brain tumor  . COPD Mother   . Cystic fibrosis Sister     also heart failure  . Heart disease Father     CABG  . Mental illness Brother   . Heart failure Paternal Grandmother     SOCHx:   reports that he quit smoking about 8 years ago. His smoking use included Cigarettes. He has a 48 pack-year smoking history. He has never used smokeless tobacco. He reports that  drinks alcohol. He reports that he does not use illicit drugs.  ALLERGIES:  Allergies  Allergen Reactions  . Codeine Other (See Comments)    unknown  . Cymbalta [Duloxetine Hcl] Nausea And Vomiting  . Montelukast Sodium Other (See Comments)    Causes sinusitis    ROS: A comprehensive review of systems was negative except for: Cardiovascular: positive for chest pain and left arm pian  HOME MEDS: Current Outpatient Prescriptions  Medication Sig Dispense Refill  . albuterol (PROVENTIL HFA;VENTOLIN HFA) 108 (90 BASE) MCG/ACT inhaler Inhale 2 puffs into the lungs every 6 (six) hours as needed.        Marland Kitchen albuterol (PROVENTIL) (2.5 MG/3ML) 0.083% nebulizer solution Take 2.5 mg by nebulization every 6 (six) hours as needed.        Marland Kitchen alendronate (FOSAMAX) 70 MG tablet Take 70 mg by mouth every 7 (seven) days. Take with a full glass of water on an empty stomach. On Mondays      . amoxicillin (AMOXIL) 500 MG capsule Take 1 capsule (500 mg total) by mouth 2 (two) times daily.  10 capsule  0  . aspirin 81 MG tablet Take 81 mg by mouth daily.        . budesonide-formoterol (SYMBICORT) 160-4.5 MCG/ACT inhaler Inhale 2  puffs into the lungs 2 (two) times daily. Sample x 3 days       . levothyroxine (SYNTHROID, LEVOTHROID) 125 MCG tablet Take 67.5 mcg by mouth daily before breakfast.       . nitroGLYCERIN (NITROSTAT) 0.4 MG SL tablet Place 1 tablet (0.4 mg total) under the tongue every 5 (five) minutes as needed for chest pain.  20 tablet  0  . pravastatin (PRAVACHOL) 40 MG tablet Take 40 mg by mouth every evening.       . temazepam (RESTORIL) 30 MG capsule Take 30 mg by mouth at bedtime as needed for sleep.      Marland Kitchen tiotropium (SPIRIVA) 18 MCG inhalation capsule Place 18 mcg into inhaler and inhale daily.        . Vitamin D, Ergocalciferol, (DRISDOL) 50000 UNITS CAPS Take 1 tablet by mouth Once every 2 weeks.       No current facility-administered medications for this visit.    LABS/IMAGING: No results found for this or any previous visit (from the past 48 hour(s)). No results found.  VITALS: BP 118/62  Pulse 88  Ht 5\' 10"  (1.778 m)  Wt 205 lb 6.4 oz (93.169 kg)  BMI 29.47 kg/m2  EXAM: General appearance: alert and no distress Neck: no adenopathy, no carotid bruit, no JVD, supple, symmetrical, trachea midline and thyroid not enlarged, symmetric, no tenderness/mass/nodules Lungs: clear to auscultation bilaterally Heart: regular rate and rhythm, S1, S2 normal, no murmur, click, rub or gallop Abdomen: soft, non-tender; bowel sounds normal; no masses,  no organomegaly Extremities: extremities normal, atraumatic, no cyanosis or edema Pulses: 2+ and symmetric Skin: Skin color, texture, turgor normal. No rashes or lesions Neurologic: Grossly normal  EKG: Sinus rhythm at 88  ASSESSMENT: 1. Chest pain, with cardiac risk factors  PLAN: 1.   Mr. Meenach and does have a number of cardiac risk factors including long-standing smoking history, chest wall radiation, prior malignancy, dyslipidemia, and family history of heart disease. As before would recommend he undergo a stress test and is more willing to do  that at this time. We'll have the results back shortly and I'll be in touch with those.  Chrystie Nose, MD, [facility] Attending Cardiologist [facility]  Ladoris Lythgoe C 08/14/2013, 2:00 PM

## 2013-08-17 ENCOUNTER — Ambulatory Visit (HOSPITAL_COMMUNITY)
Admission: RE | Admit: 2013-08-17 | Discharge: 2013-08-17 | Disposition: A | Discharge status: Home / Self Care | Payer: 59 | Source: Ambulatory Visit | Attending: Cardiology | Admitting: Cardiology

## 2013-08-17 DIAGNOSIS — Z87891 Personal history of nicotine dependence: Secondary | ICD-10-CM | POA: Insufficient documentation

## 2013-08-17 DIAGNOSIS — R079 Chest pain, unspecified: Secondary | ICD-10-CM

## 2013-08-17 DIAGNOSIS — R42 Dizziness and giddiness: Secondary | ICD-10-CM | POA: Insufficient documentation

## 2013-08-17 DIAGNOSIS — R5381 Other malaise: Secondary | ICD-10-CM | POA: Insufficient documentation

## 2013-08-17 DIAGNOSIS — R0989 Other specified symptoms and signs involving the circulatory and respiratory systems: Secondary | ICD-10-CM | POA: Insufficient documentation

## 2013-08-17 DIAGNOSIS — Z8249 Family history of ischemic heart disease and other diseases of the circulatory system: Secondary | ICD-10-CM | POA: Insufficient documentation

## 2013-08-17 DIAGNOSIS — Z8673 Personal history of transient ischemic attack (TIA), and cerebral infarction without residual deficits: Secondary | ICD-10-CM | POA: Insufficient documentation

## 2013-08-17 DIAGNOSIS — R0609 Other forms of dyspnea: Secondary | ICD-10-CM | POA: Insufficient documentation

## 2013-08-17 DIAGNOSIS — E663 Overweight: Secondary | ICD-10-CM | POA: Insufficient documentation

## 2013-08-17 MED ORDER — REGADENOSON 0.4 MG/5ML IV SOLN
0.4000 mg | Freq: Once | INTRAVENOUS | Status: AC
  Administered 2013-08-17: 0.4 mg via INTRAVENOUS

## 2013-08-17 MED ORDER — TECHNETIUM TC 99M SESTAMIBI GENERIC - CARDIOLITE
30.7000 | Freq: Once | INTRAVENOUS | Status: AC | PRN
  Administered 2013-08-17: 30.7 via INTRAVENOUS

## 2013-08-17 MED ORDER — TECHNETIUM TC 99M SESTAMIBI GENERIC - CARDIOLITE
10.1000 | Freq: Once | INTRAVENOUS | Status: AC | PRN
  Administered 2013-08-17: 10.1 via INTRAVENOUS

## 2013-08-17 MED ORDER — AMINOPHYLLINE 25 MG/ML IV SOLN
75.0000 mg | Freq: Once | INTRAVENOUS | Status: AC
  Administered 2013-08-17: 75 mg via INTRAVENOUS

## 2013-08-17 NOTE — Procedures (Addendum)
[facility] [address] [place] [address] [place] [address] [phone]  Cardiology Nuclear Med Study  Stephen [name] is a 57 y.o. male     MRN : [id number]     DOB: 06/11/1956  Procedure Date: 08/17/2013  Nuclear Med Background Indication for Stress Test:  Evaluation for Ischemia and Post Hospital History:  COPD Cardiac Risk Factors: Family History - CAD, History of Smoking, Lipids, Overweight and TIA  Symptoms:  Chest Pain, DOE, Fatigue and Light-Headedness   Nuclear Pre-Procedure Caffeine/Decaff Intake:  7:00pm NPO After: 5:00am   IV Site: R Hand  IV 0.9% NS with Angio Cath:  22g  Chest Size (in):  44"  IV Started by: Emmit Pomfret, RN  Height: 5\' 10"  (1.778 m)  Cup Size: n/a  BMI:  Body mass index is 29.41 kg/(m^2). Weight:  205 lb (92.987 kg)   Tech Comments:  N/A    Nuclear Med Study 1 or 2 day study: 1 day  Stress Test Type:  Lexiscan  Order Authorizing Provider:  Zoila Shutter, MD   Resting Radionuclide: Technetium 58m Sestamibi  Resting Radionuclide Dose: 10.1 mCi   Stress Radionuclide:  Technetium 10m Sestamibi  Stress Radionuclide Dose: 30.7 mCi           Stress Protocol Rest HR: 78 Stress HR: 108  Rest BP: 137/99 Stress BP: 147/94  Exercise Time (min): n/a METS: n/a   Predicted Max HR: 163 bpm % Max HR: 66.26 bpm Rate Pressure Product: 24401  Dose of Adenosine (mg):  n/a Dose of Lexiscan: 0.4 mg  Dose of Atropine (mg): n/a Dose of Dobutamine: n/a mcg/kg/min (at max HR)  Stress Test Technologist: Esperanza Sheets, CCT Nuclear Technologist: Gonzella Lex, CNMT   Rest Procedure:  Myocardial perfusion imaging was performed at rest 45 minutes following the intravenous administration of Technetium 50m Sestamibi. Stress Procedure:  The patient received IV Lexiscan 0.4 mg over 15-seconds.  Technetium 66m Sestamibi injected at 30-seconds.  The patient experienced Nausea; 75 mg of IV Aminophylline was administered  with resolution of symptom.  There were no significant changes with Lexiscan.  Quantitative spect images were obtained after a 45 minute delay.  Transient Ischemic Dilatation (Normal <1.22):  0.88 Lung/Heart Ratio (Normal <0.45):  0.28 QGS EDV:  70 ml QGS ESV:  20 ml LV Ejection Fraction: 72%  Signed by Gonzella Lex, CNMT  PHYSICIAN INTERPRETATION  Rest ECG: NSR with non-specific ST-T wave changes  Stress ECG: No significant change from baseline ECG and No significant ST segment change suggestive of ischemia.  QPS Raw Data Images:  There is notable tracer uptake in the sub-disphragmatic splanchnic viserca (mostly hepatic) that obscures the inferoseptal wall.  This definitely reduces the ability to interpret this study. Stress Images:  Scintigraphically reduced uptake in the mid to basal inferoseptal wall.  Small sized mild intensity - mostly fixed (although computer interpretation notes reversibility - this is clearly not the case) Rest Images:  Comparison with the stress images reveals no significant change. Subtraction (SDS):  There is a fixed inferior defect that is most consistent with diaphragmatic attenuation.  No reversibility is appreciated.  No evidence of ischemia. With a fixed defect that has totally normal wall thickening and wall motion, a prior infarct is not likely.  Impression Exercise Capacity:  Lexiscan with no exercise. BP Response:  Normal blood pressure response. Clinical Symptoms:  nausea ECG Impression:  No significant ST segment change suggestive of ischemia. Comparison with Prior Nuclear Study: No  images to compare  Overall Impression:  Low risk stress nuclear study with bowel artifact obscuring the inferoseptal wall.  No evidence of ischemia or infarction..  LV Wall Motion:  NL LV Function; NL Wall Motion   HARDING,DAVID W, MD  08/17/2013 1:15 PM

## 2013-08-20 ENCOUNTER — Encounter: Payer: Self-pay | Admitting: Internal Medicine

## 2013-08-23 ENCOUNTER — Encounter: Payer: Self-pay | Admitting: Internal Medicine

## 2013-08-23 ENCOUNTER — Ambulatory Visit (INDEPENDENT_AMBULATORY_CARE_PROVIDER_SITE_OTHER): Payer: 59 | Admitting: Internal Medicine

## 2013-08-23 VITALS — BP 126/96 | HR 80 | Ht 70.0 in | Wt 204.7 lb

## 2013-08-23 DIAGNOSIS — J449 Chronic obstructive pulmonary disease, unspecified: Secondary | ICD-10-CM

## 2013-08-23 DIAGNOSIS — R079 Chest pain, unspecified: Secondary | ICD-10-CM

## 2013-08-23 NOTE — Progress Notes (Signed)
OFFICE NOTE  Chief Complaint:  Chest pain, left arm pain  Primary Care Physician: Ezequiel Kayser, MD  HPI:  Stephen [name] is a 57 year old gentleman with a history of lung, throat and right airway cancer status post radiation, partial resection, partial pneumonectomy in the distant past as well as a strong history of smoking, COPD, hypertension, dyslipidemia. Recently, he had an episode of what sounds like transient binocular visual field loss mostly with loss of peripheral vision which lasted about 5 minutes. Symptoms resolved on their own. He was referred to [place] for evaluation of that as well as some symptoms of chest pain he has been having which is somewhat sharp, occasionally radiates to his left arm causing some numbness and tingling in that arm. He does report being under a significant amount of stress recently more than typical and he feels that that is most likely the cause of his symptoms. He has numerous cardiac risk factors including a history of COPD which is long-standing and smoking as well as partial lobe resection and lung cancer status post chest wall radiation, which can increase his risk for development of coronary disease. At his last office visit I recommended stress testing but he politely declined. Recently presented to the hospital with another episode of left arm pain and chest heaviness that went up to his stroke. He took some aspirin and was seen at local fire department who recommended he come to the emergency department. He was admitted and ruled out and had an echocardiogram which showed no significant abnormalities. Returns today to discuss possible cardiac stress test.  He underwent nuclear stress testing on 08/17/2013 which demonstrated a small amount of inferior bowel artifact but no reversible ischemia. He's had no further chest or arm pain episodes.  PMHx:  Past Medical History  Diagnosis Date  . Allergic rhinitis, cause unspecified   . Other emphysema     copd  . Unspecified vitamin D deficiency   . Dyspnea   . Osteoporosis   . Thrombosed hemorrhoids   . Lung cancer     lung ca dx 06  . Throat cancer     throat ca dx 2007  . Blindness of left eye   . Hypothyroidism     Past Surgical History  Procedure Laterality Date  . Appendectomy    . Lung lobectomy      RUL  . Rotator cuff repair    . Knee surgery    . Shoulder surgery    . Throat surgery      Laser surgery for throat cancer  . Transthoracic echocardiogram  01/13/2012    EF=>55%; trace MR/TR;     FAMHx:  Family History  Problem Relation Age of Onset  . Cancer Mother     Brain tumor  . COPD Mother   . Cystic fibrosis Sister     also heart failure  . Heart disease Father     CABG  . Mental illness Brother   . Heart failure Paternal Grandmother     SOCHx:   reports that he quit smoking about 8 years ago. His smoking use included Cigarettes. He has a 48 pack-year smoking history. He has never used smokeless tobacco. He reports that  drinks alcohol. He reports that he does not use illicit drugs.  ALLERGIES:  Allergies  Allergen Reactions  . Codeine Other (See Comments)    unknown  . Cymbalta [Duloxetine Hcl] Nausea And Vomiting  . Montelukast Sodium Other (See Comments)  Causes sinusitis    ROS: A comprehensive review of systems was negative except for: Cardiovascular: positive for chest pain and left arm pian  HOME MEDS: Current Outpatient Prescriptions  Medication Sig Dispense Refill  . albuterol (PROVENTIL HFA;VENTOLIN HFA) 108 (90 BASE) MCG/ACT inhaler Inhale 2 puffs into the lungs every 6 (six) hours as needed.        Marland Kitchen albuterol (PROVENTIL) (2.5 MG/3ML) 0.083% nebulizer solution Take 2.5 mg by nebulization every 6 (six) hours as needed.        Marland Kitchen alendronate (FOSAMAX) 70 MG tablet Take 70 mg by mouth every 7 (seven) days. Take with a full glass of water on an empty stomach. On Mondays      . aspirin 81 MG tablet Take 81 mg by mouth daily.        .  budesonide-formoterol (SYMBICORT) 160-4.5 MCG/ACT inhaler Inhale 2 puffs into the lungs 2 (two) times daily. Sample x 3 days       . levothyroxine (SYNTHROID, LEVOTHROID) 125 MCG tablet Take 67.5 mcg by mouth daily before breakfast.       . nitroGLYCERIN (NITROSTAT) 0.4 MG SL tablet Place 1 tablet (0.4 mg total) under the tongue every 5 (five) minutes as needed for chest pain.  20 tablet  0  . pravastatin (PRAVACHOL) 40 MG tablet Take 40 mg by mouth every evening.       . temazepam (RESTORIL) 30 MG capsule Take 30 mg by mouth at bedtime as needed for sleep.      Marland Kitchen tiotropium (SPIRIVA) 18 MCG inhalation capsule Place 18 mcg into inhaler and inhale daily.        . Vitamin D, Ergocalciferol, (DRISDOL) 50000 UNITS CAPS Take 1 tablet by mouth Once every 2 weeks.       No current facility-administered medications for this visit.    LABS/IMAGING: No results found for this or any previous visit (from the past 48 hour(s)). No results found.  VITALS: BP 126/96  Pulse 80  Ht 5\' 10"  (1.778 m)  Wt 204 lb 11.2 oz (92.851 kg)  BMI 29.37 kg/m2  EXAM: Deferred  EKG: deferred  ASSESSMENT: 1. Chest pain, with cardiac risk factors 2. Low risk nuclear stress test, negative for ischemia  PLAN: 1.   Stephen [name] had an nuclear stress test which was negative for ischemia. He has had significant radiation exposure including the stress test. I debated due to the chemotherapy dose of his CTs and radiation he received 4 cancer therapy. Unfortunately, he could not exercise enough on a treadmill for about study, and therefore LexiScan was the only option. He is certainly concern about ongoing medical radiation exposure and wishes to discuss this with his oncologist further. He can followup with [place] only as needed.  Chrystie Nose, MD, [facility] Attending Cardiologist [facility]  HILTY,Kenneth C 08/23/2013, 9:54 AM

## 2013-08-23 NOTE — Patient Instructions (Signed)
Follow up as needed

## 2013-09-14 ENCOUNTER — Encounter (HOSPITAL_COMMUNITY): Payer: Self-pay

## 2013-09-14 ENCOUNTER — Other Ambulatory Visit (HOSPITAL_BASED_OUTPATIENT_CLINIC_OR_DEPARTMENT_OTHER): Payer: 59 | Admitting: Lab

## 2013-09-14 ENCOUNTER — Ambulatory Visit (HOSPITAL_COMMUNITY)
Admission: RE | Admit: 2013-09-14 | Discharge: 2013-09-14 | Disposition: A | Discharge status: Home / Self Care | Payer: 59 | Source: Ambulatory Visit | Attending: Internal Medicine | Admitting: Internal Medicine

## 2013-09-14 DIAGNOSIS — K8020 Calculus of gallbladder without cholecystitis without obstruction: Secondary | ICD-10-CM | POA: Insufficient documentation

## 2013-09-14 DIAGNOSIS — C3490 Malignant neoplasm of unspecified part of unspecified bronchus or lung: Secondary | ICD-10-CM

## 2013-09-14 DIAGNOSIS — R911 Solitary pulmonary nodule: Secondary | ICD-10-CM | POA: Insufficient documentation

## 2013-09-14 DIAGNOSIS — M899 Disorder of bone, unspecified: Secondary | ICD-10-CM | POA: Insufficient documentation

## 2013-09-14 LAB — COMPREHENSIVE METABOLIC PANEL (CC13)
CO2: 27 mEq/L (ref 22–29)
Calcium: 8.9 mg/dL (ref 8.4–10.4)
Creatinine: 0.8 mg/dL (ref 0.7–1.3)
Glucose: 98 mg/dl (ref 70–140)
Total Bilirubin: 0.85 mg/dL (ref 0.20–1.20)

## 2013-09-14 LAB — CBC WITH DIFFERENTIAL/PLATELET
Basophils Absolute: 0.1 10*3/uL (ref 0.0–0.1)
Eosinophils Absolute: 0.4 10*3/uL (ref 0.0–0.5)
HCT: 39.6 % (ref 38.4–49.9)
HGB: 13.6 g/dL (ref 13.0–17.1)
LYMPH%: 16.8 % (ref 14.0–49.0)
MONO#: 0.8 10*3/uL (ref 0.1–0.9)
NEUT#: 3.9 10*3/uL (ref 1.5–6.5)
NEUT%: 63.1 % (ref 39.0–75.0)
Platelets: 166 10*3/uL (ref 140–400)
WBC: 6.2 10*3/uL (ref 4.0–10.3)

## 2013-09-14 MED ORDER — IOHEXOL 300 MG/ML  SOLN
80.0000 mL | Freq: Once | INTRAMUSCULAR | Status: AC | PRN
  Administered 2013-09-14: 80 mL via INTRAVENOUS

## 2013-09-16 ENCOUNTER — Encounter: Payer: Self-pay | Admitting: Internal Medicine

## 2013-09-16 ENCOUNTER — Ambulatory Visit (HOSPITAL_BASED_OUTPATIENT_CLINIC_OR_DEPARTMENT_OTHER): Payer: 59 | Admitting: Internal Medicine

## 2013-09-16 ENCOUNTER — Telehealth: Payer: Self-pay | Admitting: Internal Medicine

## 2013-09-16 VITALS — BP 146/86 | HR 80 | Temp 97.0°F | Resp 18 | Ht 70.0 in | Wt 205.6 lb

## 2013-09-16 DIAGNOSIS — Z85819 Personal history of malignant neoplasm of unspecified site of lip, oral cavity, and pharynx: Secondary | ICD-10-CM

## 2013-09-16 DIAGNOSIS — C3490 Malignant neoplasm of unspecified part of unspecified bronchus or lung: Secondary | ICD-10-CM

## 2013-09-16 DIAGNOSIS — Z85118 Personal history of other malignant neoplasm of bronchus and lung: Secondary | ICD-10-CM

## 2013-09-16 NOTE — Progress Notes (Signed)
[facility] Telephone:(336) [phone]   Fax:(336) [phone]  OFFICE PROGRESS NOTE  Ezequiel Kayser, MD [address] [place] [address]  PRINCIPAL DIAGNOSES:  1. Recurrent non-small cell lung cancer, presented with endobronchial lesion involving the right main stem bronchus diagnosed in March 2007.  2. History of stage IB non-small cell lung cancer diagnosed in May 2006.  3. 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  PRIOR THERAPY:  1. Status post right upper lobectomy on May 01, 2005 under the care of Dr. Edwyna Shell.  2. Status post laryngoscopy with CO2 laser excision of the right vocal cord lesion under the care of Dr. Jenne Pane in March 2007.  3. Status post concurrent chemoradiation with weekly carboplatin and paclitaxel. Last dose was given May 12, 2006.  4. Status post 3 cycles of consolidation chemotherapy with docetaxel. Last dose was given August 21, 2006.  CURRENT THERAPY: Observation.  INTERVAL HISTORY: Stephen [name] 57 y.o. male returns to the clinic today for routine annual follow up visit. The patient is feeling fine today with no specific complaints. He recently underwent cardiac workup that was unremarkable. He denied having any significant chest pain, shortness breath, cough or hemoptysis. The patient denied having any significant weight loss or night sweats. He has no nausea or vomiting. He had repeat CT scan of the chest performed recently and he is here for evaluation and discussion of his scan results.  MEDICAL HISTORY: Past Medical History  Diagnosis Date  . Allergic rhinitis, cause unspecified   . Other emphysema     copd  . Unspecified vitamin D deficiency   . Dyspnea   . Osteoporosis   . Thrombosed hemorrhoids   . Lung cancer     lung ca dx 06  . Throat cancer     throat ca dx 2007  . Blindness of left eye   . Hypothyroidism     ALLERGIES:  is allergic to codeine; cymbalta; and montelukast  sodium.  MEDICATIONS:  Current Outpatient Prescriptions  Medication Sig Dispense Refill  . albuterol (PROVENTIL HFA;VENTOLIN HFA) 108 (90 BASE) MCG/ACT inhaler Inhale 2 puffs into the lungs every 6 (six) hours as needed.        Marland Kitchen albuterol (PROVENTIL) (2.5 MG/3ML) 0.083% nebulizer solution Take 2.5 mg by nebulization every 6 (six) hours as needed.        Marland Kitchen alendronate (FOSAMAX) 70 MG tablet Take 70 mg by mouth every 7 (seven) days. Take with a full glass of water on an empty stomach. On Mondays      . aspirin 81 MG tablet Take 81 mg by mouth daily.        . budesonide-formoterol (SYMBICORT) 160-4.5 MCG/ACT inhaler Inhale 2 puffs into the lungs 2 (two) times daily. Sample x 3 days       . levothyroxine (SYNTHROID, LEVOTHROID) 125 MCG tablet Take 67.5 mcg by mouth daily before breakfast.       . nitroGLYCERIN (NITROSTAT) 0.4 MG SL tablet Place 1 tablet (0.4 mg total) under the tongue every 5 (five) minutes as needed for chest pain.  20 tablet  0  . pravastatin (PRAVACHOL) 40 MG tablet Take 40 mg by mouth every evening.       . temazepam (RESTORIL) 30 MG capsule Take 30 mg by mouth at bedtime as needed for sleep.      Marland Kitchen tiotropium (SPIRIVA) 18 MCG inhalation capsule Place 18 mcg into inhaler and inhale  daily.        . Vitamin D, Ergocalciferol, (DRISDOL) 50000 UNITS CAPS Take 1 tablet by mouth Once every 2 weeks.       No current facility-administered medications for this visit.    SURGICAL HISTORY:  Past Surgical History  Procedure Laterality Date  . Appendectomy    . Lung lobectomy      RUL  . Rotator cuff repair    . Knee surgery    . Shoulder surgery    . Throat surgery      Laser surgery for throat cancer  . Transthoracic echocardiogram  01/13/2012    EF=>55%; trace MR/TR;     REVIEW OF SYSTEMS:  A comprehensive review of systems was negative.   PHYSICAL EXAMINATION: General appearance: alert, cooperative and no distress Head: Normocephalic, without obvious abnormality,  atraumatic Neck: no adenopathy, no JVD and supple, symmetrical, trachea midline Lymph nodes: Cervical, supraclavicular, and axillary nodes normal. Resp: clear to auscultation bilaterally Cardio: regular rate and rhythm, S1, S2 normal, no murmur, click, rub or gallop GI: soft, non-tender; bowel sounds normal; no masses,  no organomegaly Extremities: extremities normal, atraumatic, no cyanosis or edema  ECOG PERFORMANCE STATUS: 0 - Asymptomatic  Blood pressure 146/86, pulse 80, temperature 97 F (36.1 C), temperature source Oral, resp. rate 18, height 5\' 10"  (1.778 m), weight 205 lb 9.6 oz (93.26 kg).  LABORATORY DATA: Lab Results  Component Value Date   WBC 6.2 09/14/2013   HGB 13.6 09/14/2013   HCT 39.6 09/14/2013   MCV 84.2 09/14/2013   PLT 166 09/14/2013      Chemistry      Component Value Date/Time   NA 142 09/14/2013 0809   NA 141 08/08/2013 2123   NA 142 03/11/2012 1125   K 3.3* 09/14/2013 0809   K 3.6 08/08/2013 2123   K 3.8 03/11/2012 1125   CL 104 08/08/2013 2123   CL 106 09/14/2012 0808   CL 98 03/11/2012 1125   CO2 27 09/14/2013 0809   CO2 26 08/08/2013 2123   CO2 30 03/11/2012 1125   BUN 12.9 09/14/2013 0809   BUN 17 08/08/2013 2123   BUN 18 03/11/2012 1125   CREATININE 0.8 09/14/2013 0809   CREATININE 0.80 08/08/2013 2123   CREATININE 1.0 03/11/2012 1125      Component Value Date/Time   CALCIUM 8.9 09/14/2013 0809   CALCIUM 9.3 08/08/2013 2123   CALCIUM 9.0 03/11/2012 1125   ALKPHOS 112 09/14/2013 0809   ALKPHOS 95 08/08/2013 2123   ALKPHOS 98* 03/11/2012 1125   AST 19 09/14/2013 0809   AST 29 08/08/2013 2123   AST 26 03/11/2012 1125   ALT 19 09/14/2013 0809   ALT 25 08/08/2013 2123   ALT 35 03/11/2012 1125   BILITOT 0.85 09/14/2013 0809   BILITOT 0.5 08/08/2013 2123   BILITOT 0.80 03/11/2012 1125       RADIOGRAPHIC STUDIES: Ct Chest W Contrast  09/14/2013   *RADIOLOGY REPORT*  Clinical Data: Restaging lung cancer.  CT CHEST WITH CONTRAST  Technique:  Multidetector CT imaging  of the chest was performed following the standard protocol during bolus administration of intravenous contrast.  Contrast: 80mL OMNIPAQUE IOHEXOL 300 MG/ML  SOLN  Comparison: 09/14/2012  Findings: Again identified are postoperative changes and volume loss involving the right lung.  Similar appearance of chronic consolidation within the right upper lung zone compatible with changes of external beam radiation.  Right midlung nodule measures 4 mm and is unchanged from previous exam, image  26/series 5.  No new or enlarging pulmonary nodules or masses noted.  High right paratracheal lymph node measures 8 mm, image 14/series 2.  Previously 9 mm.  There is no new or enlarging mediastinal or hilar lymph nodes identified.  The main pulmonary artery appears patent.  No large saddle embolus identified.  The esophagus appears within normal limits.  No supraclavicular or axillary adenopathy identified.  Incidental imaging through the upper abdomen shows no acute findings.  The adrenal glands both appear within normal limits. There is a stone within the gallbladder neck which measures 7 mm, image 65/series 2.  Stable lucent lesion within the L1 vertebra measuring 1 cm, image 64/series 2.  IMPRESSION:  1.  No acute findings identified within the chest. 2.  There are no specific features identified to suggest residual or recurrence of local tumor or metastatic disease   Original Report Authenticated By: Signa Kell, M.D.    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 He is status post right upper lobectomy as well as course of concurrent chemoradiation followed by consolidation chemotherapy and has been observation since August of 2007 with no evidence for disease recurrence. I discussed the scan results with the patient today. I recommended for him to continue on observation with  repeat CT scan of the chest in one year. He was advised to call immediately if he has any concerning symptoms in the interval.  The patient voices understanding of current disease status and treatment options and is in agreement with the current care plan.  All questions were answered. The patient knows to call the clinic with any problems, questions or concerns. We can certainly see the patient much sooner if necessary.

## 2013-09-16 NOTE — Telephone Encounter (Signed)
Gave pt appt for lab and MD for September 2015 °

## 2013-09-18 NOTE — Patient Instructions (Signed)
Followup visit in one year with repeat CT scan of the chest. 

## 2013-10-12 ENCOUNTER — Other Ambulatory Visit: Payer: Self-pay | Admitting: *Deleted

## 2013-10-12 NOTE — Progress Notes (Signed)
Pt in clinic stopped by Dr Asa Lente office.  He wants to know if he can have any information regarding a well balanced diet.  Will make nutrition consult.  SLJ

## 2013-10-14 ENCOUNTER — Telehealth: Payer: Self-pay | Admitting: Internal Medicine

## 2013-10-14 NOTE — Telephone Encounter (Signed)
s.w. pt and sched 10.24.14 appt for nut..done

## 2013-10-19 ENCOUNTER — Encounter: Payer: Self-pay | Admitting: Internal Medicine

## 2013-10-19 NOTE — Progress Notes (Signed)
Patient had left a message to call him. I called and left him a message to call back.

## 2013-10-22 ENCOUNTER — Ambulatory Visit: Payer: 59 | Admitting: Nutrition

## 2013-10-22 NOTE — Progress Notes (Signed)
This is a 57 year old male diagnosed with lung cancer.  He is a patient of Dr. Shirline Frees under observation.  Past medical history includes unspecified vitamin D deficiency, osteoporosis, throat cancer and hypothyroidism.  Medications include Synthroid, Pravachol, Restoril, vitamin D.  Labs include potassium 3.3 on September 16.  Height: 5 feet 10 inches. Weight: 205.6 pounds. BMI: 29.5.  Patient is interested in learning how to eat a healthier diet.  He currently eats one meal a day.  He reports he eats a lot of hot dogs and hamburgers.  He will eat sandwiches and soup occasionally.  He complains of heartburn, abdominal problems, and constipation.  Patient states he does enjoy vegetables, and fruits, but does not ever go to the grocery store.  He does eat out  frequently.  Patient has general knowledge of what healthy diet consists of however, he does not follow these guidelines.  Nutrition diagnosis: Undesirable food choices related to perception that lack of resources prevent selection of food choices consistent with recommendations and lack of motivation to apply change as evidenced by patient's inability to apply appropriate nutrition guidelines.    Intervention: Patient was educated on healthy diet and changes he could easily make to improve meal choices.  He was encouraged to eat 3 smaller meals daily.  He was encouraged to choose more vegetables and lean proteins whether he is eating out or whether he is cooking in.  Patient educated to purchase prepackaged fruits and vegetables for easier consumption.  Fact sheets were provided.  Teach back method used.  It is unclear whether patient is ready for diet/lifestyle changes.  Monitoring, evaluation, goals: Patient will attempt one small healthy food change weekly to consume a healthier plant-based diet.  Next visit: Patient will contact me in about 3 weeks for followup.

## 2014-02-11 ENCOUNTER — Telehealth: Payer: Self-pay | Admitting: Internal Medicine

## 2014-02-11 NOTE — Telephone Encounter (Signed)
Pt has not been seen since 2013. ROV has been scheduled for 02/25/2014 at 1:45pm.

## 2014-02-23 ENCOUNTER — Telehealth: Payer: Self-pay | Admitting: Gastroenterology

## 2014-02-23 NOTE — Telephone Encounter (Signed)
Left message for patient to call back  

## 2014-02-25 ENCOUNTER — Ambulatory Visit (INDEPENDENT_AMBULATORY_CARE_PROVIDER_SITE_OTHER)
Admission: RE | Admit: 2014-02-25 | Discharge: 2014-02-25 | Disposition: A | Discharge status: Home / Self Care | Payer: 59 | Source: Ambulatory Visit | Attending: Internal Medicine | Admitting: Internal Medicine

## 2014-02-25 ENCOUNTER — Encounter: Payer: Self-pay | Admitting: Internal Medicine

## 2014-02-25 ENCOUNTER — Ambulatory Visit (INDEPENDENT_AMBULATORY_CARE_PROVIDER_SITE_OTHER): Payer: 59 | Admitting: Internal Medicine

## 2014-02-25 VITALS — BP 140/82 | HR 95 | Ht 70.0 in | Wt 209.0 lb

## 2014-02-25 DIAGNOSIS — J449 Chronic obstructive pulmonary disease, unspecified: Secondary | ICD-10-CM

## 2014-02-25 MED ORDER — PREDNISONE 10 MG PO TABS: ORAL_TABLET | ORAL | Status: DC

## 2014-02-25 NOTE — Telephone Encounter (Signed)
Left message for patient to call back  

## 2014-02-25 NOTE — Patient Instructions (Addendum)
Do not know why you are having slow to resolve flare up Might be related to poor adherence with your baseline inhalers ENsure you take spiriva  And breo/symbicort (any one) daily Use albuterol as needed Do CXR today Refer to pulmonary rehab for moderate copd Please take prednisone 40 mg x1 day, then 30 mg x1 day, then 20 mg x1 day, then 10 mg x1 day, and then 5 mg x1 day and stop  #Preoperative pulmonary evaluation for colonoscopy  Low risk procedure as long as there is no active wheezing or hypoxemia  Followup  2 weeks with spirometry to see me or my NP Tammy

## 2014-02-25 NOTE — Progress Notes (Signed)
Subjective:    Patient ID: Stephen [name], male    DOB: 1956-11-01, 58 y.o.   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 Unchanged PFTs 2009 -> Feb 2012. Feb 2012 PFTs - fev1 1.97L/57%, Ratio 78%  - clas 2 dyspnea, Rx with spiriva and symbicort   -  Low risk nuclear med scan heard 08/17/13 - Dr Debara Pickett  B)  Quit smoking 2006   C) Rt lung cancer 2006. Ex--throat cancer 2007. RLL 74mm nodule feb 2012-> resolved May 2012  -> In remission sept 2014: Dr Earlie Server  D) Chronic sinus alllergies - Dr Donneta Romberg, Dr. Redmond Baseman.   - last allergy test fall 2012 - plan to increase shots and positive   E) Recc AECOPD  - Aug 2012 x 2 - Dec 2012  F) Osteoporosis per hx    OV 02/25/2014  Followup moderate COPD and MZ. Phenotype. I have not seen him in June 2013. He is a very poor historian and is giving conflicting information. His most recent issues that since early January 2015 he is had symptoms of COPD flare up with increasing cough tightness in the chest and green sputum. He is been treated with several courses of antibiotics and prednisone since then but without any relief. He freely admits that he is poorly adherent to his Spiriva and Symbicort regimen. On 02/03/2014 his primary care physician switched him to [place] instead of Symbicort but he's not sure which one he is taking. He is also not sure what regimen he takes and frequency. He changes his story it and the question asked. Nevertheless, he is frustrated about persistent cough and wheezing since then. Currently his rhythm is clear and small in amount. No active fever. Apparently a chest x-ray primary care office a few weeks ago and this was normal. This and is interested in pulmonary rehabilitation  Of note, he is due to have a colonoscopy and he wants clearance  COPD Cat score is 26 and reflect moderate symp burdern   CAT COPD Symptom & Quality of Life Score ([place] trademark) 0 is no burden. 5 is highest burden 02/25/2014    Never Cough -> Cough all the time 3  No phlegm in chest -> Chest is full of phlegm 3  No chest tightness -> Chest feels very tight 3  No dyspnea for 1 flight stairs/hill -> Very dyspneic for 1 flight of stairs 4  No limitations for ADL at home -> Very limited with ADL at home 5  Confident leaving home -> Not at all confident leaving home 2  Sleep soundly -> Do not sleep soundly because of lung condition 3  Lots of Energy -> No energy at all 3  TOTAL Score (max 40)  25   No results found. CXR - clear   Review of Systems  Constitutional: Negative for fever and unexpected weight change.  HENT: Positive for congestion, postnasal drip and sinus pressure. Negative for dental problem, ear pain, nosebleeds, rhinorrhea, sneezing, sore throat and trouble swallowing.   Eyes: Negative for redness and itching.  Respiratory: Positive for cough, shortness of breath and wheezing. Negative for chest tightness.   Cardiovascular: Negative for palpitations and leg swelling.  Gastrointestinal: Negative for nausea and vomiting.  Genitourinary: Negative for dysuria.  Musculoskeletal: Negative for joint swelling.  Skin: Negative for rash.  Neurological: Negative for headaches.  Hematological: Does not bruise/bleed easily.  Psychiatric/Behavioral: Negative for dysphoric mood. The patient is not nervous/anxious.  Objective:   Physical Exam  Nursing note and vitals reviewed. Constitutional: He is oriented to person, place, and time. He appears well-developed and well-nourished. No distress.  HENT:  Head: Normocephalic and atraumatic.  Right Ear: External ear normal.  Left Ear: External ear normal.  Mouth/Throat: Oropharynx is clear and moist. No oropharyngeal exudate.  Eyes: Conjunctivae and EOM are normal. Pupils are equal, round, and reactive to light. Right eye exhibits no discharge. Left eye exhibits no discharge. No scleral icterus.  Neck: Normal range of motion. Neck supple. No JVD present.  No tracheal deviation present. No thyromegaly present.  Cardiovascular: Normal rate, regular rhythm and intact distal pulses.  Exam reveals no gallop and no friction rub.   No murmur heard. Pulmonary/Chest: Effort normal. No respiratory distress. He has wheezes. He has no rales. He exhibits no tenderness.  bialteral wheeze Hoarse voice  Abdominal: Soft. Bowel sounds are normal. He exhibits no distension and no mass. There is no tenderness. There is no rebound and no guarding.  Musculoskeletal: Normal range of motion. He exhibits no edema and no tenderness.  Lymphadenopathy:    He has no cervical adenopathy.  Neurological: He is alert and oriented to person, place, and time. He has normal reflexes. No cranial nerve deficit. Coordination normal.  Skin: Skin is warm and dry. No rash noted. He is not diaphoretic. No erythema. No pallor.  Psychiatric: He has a normal mood and affect. His behavior is normal. Judgment and thought content normal.          Assessment & Plan:

## 2014-02-25 NOTE — Telephone Encounter (Signed)
Patient was seen today by Dr. Chase Caller he would like him to postpone colonoscopy for now due to COPD and needing pulmonary rehab.  Patient states that he will call back once he has been cleared by Dr. Chase Caller

## 2014-02-27 NOTE — Assessment & Plan Note (Signed)
Do not know why you are having slow to resolve flare up Might be related to poor adherence with your baseline inhalers ENsure you take spiriva  And breo/symbicort (any one) daily Use albuterol as needed Do CXR today Refer to pulmonary rehab for moderate copd Please take prednisone 40 mg x1 day, then 30 mg x1 day, then 20 mg x1 day, then 10 mg x1 day, and then 5 mg x1 day and stop  #Preoperative pulmonary evaluation for colonoscopy  Low risk procedure as long as there is no active wheezing or hypoxemia  Followup  2 weeks with spirometry to see me or my NP Tammy

## 2014-03-09 ENCOUNTER — Encounter (HOSPITAL_COMMUNITY): Payer: Self-pay

## 2014-03-09 ENCOUNTER — Encounter (HOSPITAL_COMMUNITY)
Admission: RE | Admit: 2014-03-09 | Discharge: 2014-03-09 | Disposition: A | Discharge status: Home / Self Care | Payer: 59 | Source: Ambulatory Visit | Attending: Internal Medicine | Admitting: Internal Medicine

## 2014-03-09 DIAGNOSIS — J449 Chronic obstructive pulmonary disease, unspecified: Secondary | ICD-10-CM | POA: Insufficient documentation

## 2014-03-09 DIAGNOSIS — J4489 Other specified chronic obstructive pulmonary disease: Secondary | ICD-10-CM | POA: Insufficient documentation

## 2014-03-09 DIAGNOSIS — Z5189 Encounter for other specified aftercare: Secondary | ICD-10-CM | POA: Insufficient documentation

## 2014-03-09 NOTE — Progress Notes (Addendum)
Stephen [name] 58 y.o. male  Initial Psychosocial Assessment  Pt psychosocial assessment reveals pt lives alone. He worked for the [employer] for 25 years and now is on disability.  He has a "girlfriend" of greater than 30 years.  He is very talkative. He has a few hobbies, playing pool, he has a boat and 2 motorcycles. Since his recent cancer diagnosis and treatment, he has lifestyle changes.  His areas of stress is high and  include his father being placed in a nursing home, feeling of abandonment from brothers and some friends after his illness. His girlfriend is also pushing him to get married.  Noted signs of depression , staying at home, admittedly watching to much TV, feeling like he would just like to sleep most of the time.  Poor coping skills.  While he was receiving chemotherapy and radiation, he was in counseling at the [facility].  He has not been in follow up.      Goal(s): Improved management of stress,depression, anxiety Improved coping skills Help patient work toward returning to meaningful activities that improve patient's QOL and are attainable with patient's lung disease   03/09/2014 4:40 PM  [name] RN

## 2014-03-09 NOTE — Progress Notes (Signed)
Stephen [name] came to Pulmonary Rehab today for orientation.  He arrived ambulatory, neatly dressed in good spirits.  We reviewed his medical history and medications.  He discussed at length his psycohsocial issues which is addressed in psychosocial notes.  He denies any discomfort today, reports some limitation to use of his shoulders due to past surgery in addition to his lung surgery.  Bilateral peripheral pulses with no edema noted in the lower extremeties.  Heart rate is regular.  Breath sounds are normal on the left side, right side with rales and rhonchi.  He is currently on antibiotic and Prednisone and has follow up appointment with Dr. Chase Caller on Friday.  We will start his exercise after visit with Dr. Guadalupe Dawn.  Department expectations and goals for Pulmonary Rehab discussed with patient. Demonstration and practice of PLB using pulse oximeter.  Patient able to return demonstration satisfactorily. Safety and hand hygiene in the exercise area reviewed with patient.  Patient voices understanding. We look forward to assisting this nice gentleman in improving his QOL.

## 2014-03-10 ENCOUNTER — Encounter (HOSPITAL_COMMUNITY): Payer: Self-pay

## 2014-03-10 ENCOUNTER — Encounter (HOSPITAL_COMMUNITY)
Admission: RE | Admit: 2014-03-10 | Discharge: 2014-03-10 | Disposition: A | Discharge status: Home / Self Care | Payer: 59 | Source: Ambulatory Visit | Attending: Internal Medicine | Admitting: Internal Medicine

## 2014-03-10 NOTE — Outcomes Assessment (Deleted)
Stephen [name] completed a Six-Minute Walk Test on 03/10/14 . Stephen [name] walked 1,285 feet with 0 breaks.  The patient's lowest oxygen saturation was 94% , highest heart rate was 114 , and highest blood pressure was 172/84. The patient was on 0 liters. Stephen [name] stated that shortness of breath hindered their walk test.

## 2014-03-10 NOTE — Outcomes Assessment (Signed)
[facility] Pulmonary Rehabilitation Baseline Outcomes Assessment   Anthropometrics:    Height (inches): 70   Weight (kg): 89.3   Grip strength was measured using a Dynamometer.  The patient's highest score was a 42.  Functional Status/Exercise Capacity:   Stephen [name] had a resting heart rate of 69 BPM, a resting blood pressure of 102/64, and an oxygen saturation of 96 % on 0 liters of O2.  Stephen [name] performed a 6-minute walk test on 03/10/14.  The patient completed 1285 feet in 6 minutes with 0 rest breaks.  This quantifies 2.84 METS.   Dyspnea Measures:   The [facility] is a simple and standardized method of classifying disability in patients with COPD.  The assessment correlates disability and dyspnea.  At entrance the patient scored a 1. The scale is provided below.   0= I only get breathless with strenuous exercise. 1= I get short of breath when hurrying on level ground or walking up a slight incline. 2= On level ground, I walk slower than people of the same age because of breathlessness, or have to stop for breath when walking at my own pace. 3= I stop for breath after walking 100 yards or after a few minutes on level ground. 4=I am too breathless to leave the house or I am breathless when dressing.     The patient completed the [place] ([facility]).  This questionnaire relates activities of daily living and shortness of breath.  The score ranges from 0-120, a higher score relates to severe shortness of breath during activities of daily living. The patient's score at entrance was 80.  Quality of Life:   Ferrans and Powers Quality of Life Index Pulmonary Version is used to assess the patients satisfaction in different domains of their life; health and functioning, socioeconomic, psychological/spiritual, and family. The overall score is recorded out of 30 points.  The patient's goal is to achieve an overall score of 21  or higher.  Stephen [name] received a 9.17 at entrance.    The Patient Health Questionnaire (PHQ-2) is a first step approach for the screening of depression.  If the patient scores positive on the PHQ-2 the patient should be further assessed with the PHQ-9.  The Patient Health Questionnaire (PHQ-9) assesses the degree of depression.  Depression is important to monitor and track in pulmonary patients due to its prevalence in the population.  If the patient advances to the PHQ-9 the goal is to score less than 4 on this assessment.  Stephen [name] scored a 1 on the PHQ-2 at entrance.  Clinical Assessment Tools:   The COPD Assessment Test (CAT) is a measurement tool to quantify how much of an impact the disease has on the patient's life.  This assessment aids the Pulmonary Rehab Team in designing the patients individualized treatment plan.  A CAT score ranges from 0-40.  A score of 10 or below indicates that COPD has a low impact on the patient's life whereas a score of 30 or higher indicates a severe impact. The patient's goal is a decrease of 1 point from entrance to discharge.  Stephen [name] had a CAT score of 26 at entrance.  Nutrition:   The "Rate My Plate" is a dietary assessment that quantifies the balance of a patient's diet.  This tool allows the Pulmonary Rehab Team to key in on the areas of the patient's diet that needs improving.  The team can then focus their nutritional education  on those areas.  If the patient scores 24-40, this means there are many ways they can make their eating habits healthier, 41-57 states that there are some ways they can make their eating habits healthier and a score of 58-72 states that they are making many healthy choices.  The patient's goal is to achieve a score of 49 or higher on this assessment.  Stephen [name] scored a 66 at entrance.  Oxygen Compliance:   Patient is currently on 0 liters at rest, 0 liters at night, and 0 liters for exercise.  Stephen [name] is not currently using a  cpap/bipap at  night.     Education:   Stephen [name] will attend education classes during the course of Pulmonary Rehab.  Education classes that will be offered to the patient are Activities of Daily Living and Energy Conservation, Pursed Lip Breathing and Diaphragmatic Breathing, Nutrition, Exercise for the Pulmonary Patient, Warning Signs of Infection, Chronic Lung Disease, Advanced Directives, Medications, and Stress and Meditation.  The patient completed an assessment at the entrance of the program and will complete it again upon discharge to demonstrate the level of understanding provided by the educational classes.  This assessment includes 14 questions regarding all of the education topics above.  Stephen [name] achieved a score of 12/14 at entrance.  Smoking Cessation:  The patient is not currently smoking.  Exercise:   Stephen [name] will be provided with an individualized Home Exercise Prescription (HEP) at the entrance of the program.  The patient will be followed by the Pulmonary Exercise Physiologist throughout the program to assist with the progression of the frequency, intensity, time, and type of exercise. The patient's long-term goal is to be exercising 30-60 minutes, 3-5 days per week. At entrance, the patient was exercising 0 days at home.

## 2014-03-10 NOTE — Progress Notes (Signed)
Stephen [name] completed a Six-Minute Walk Test on 03/10/14 . Stephen [name] walked 1,285 feet with 0 breaks. The patient's lowest oxygen saturation was 94% , highest heart rate was 114 , and highest blood pressure was 172/84. The patient was on 0 liters. Stephen [name] stated that shortness of breath hindered their walk test.

## 2014-03-11 ENCOUNTER — Encounter: Payer: Self-pay | Admitting: Adult Health

## 2014-03-11 ENCOUNTER — Ambulatory Visit (INDEPENDENT_AMBULATORY_CARE_PROVIDER_SITE_OTHER): Payer: 59 | Admitting: Adult Health

## 2014-03-11 VITALS — BP 134/82 | HR 73 | Temp 97.5°F | Ht 70.0 in | Wt 211.4 lb

## 2014-03-11 DIAGNOSIS — J449 Chronic obstructive pulmonary disease, unspecified: Secondary | ICD-10-CM

## 2014-03-11 MED ORDER — OMEPRAZOLE 20 MG PO CPDR: 20.0000 mg | DELAYED_RELEASE_CAPSULE | Freq: Every day | ORAL | Status: DC

## 2014-03-11 NOTE — Progress Notes (Signed)
Subjective:    Patient ID: Stephen [name], male    DOB: [birth year], 58 y.o.   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 Unchanged PFTs 2009 -> Feb 2012. Feb 2012 PFTs - fev1 1.97L/57%, Ratio 78%  - clas 2 dyspnea, Rx with spiriva and symbicort   -  Low risk nuclear med scan heard 08/17/13 - Dr Debara Pickett  B)  Quit smoking 2006   C) Rt lung cancer 2006. Ex--throat cancer 2007. RLL 64mm nodule feb 2012-> resolved May 2012  -> In remission sept 2014: Dr Earlie Server  D) Chronic sinus alllergies - Dr Donneta Romberg, Dr. Redmond Baseman.   - last allergy test fall 2012 - plan to increase shots and positive   E) Recc AECOPD  - Aug 2012 x 2 - Dec 2012  F) Osteoporosis per hx    OV 02/25/2014 Followup moderate COPD and MZ. Phenotype. I have not seen him in June 2013. He is a very poor historian and is giving conflicting information. His most recent issues that since early January 2015 he is had symptoms of COPD flare up with increasing cough tightness in the chest and green sputum. He is been treated with several courses of antibiotics and prednisone since then but without any relief. He freely admits that he is poorly adherent to his Spiriva and Symbicort regimen. On 02/03/2014 his primary care physician switched him to [place] instead of Symbicort but he's not sure which one he is taking. He is also not sure what regimen he takes and frequency. He changes his story it and the question asked. Nevertheless, he is frustrated about persistent cough and wheezing since then. Currently his rhythm is clear and small in amount. No active fever. Apparently a chest x-ray primary care office a few weeks ago and this was normal. This and is interested in pulmonary rehabilitation  Of note, he is due to have a colonoscopy and he wants clearance  COPD Cat score is 26 and reflect moderate symp burdern   CAT COPD Symptom & Quality of Life Score ([place] trademark) 0 is no burden. 5 is highest burden 02/25/2014    Never Cough -> Cough all the time 3  No phlegm in chest -> Chest is full of phlegm 3  No chest tightness -> Chest feels very tight 3  No dyspnea for 1 flight stairs/hill -> Very dyspneic for 1 flight of stairs 4  No limitations for ADL at home -> Very limited with ADL at home 5  Confident leaving home -> Not at all confident leaving home 2  HSleep soundly -> Do not sleep soundly because of lung condition 3  Lots of Energy -> No energy at all 3  TOTAL Score (max 40)  25   No results found. CXR - clear  03/11/2014 Follow up  Returns for 2 week follow up COPD.  Reports began Doxycycline and pred taper on 3/9 by PCP for COPD flare . Still taking both meds Taking symbicort and spirva .  Has insurance but depends on samples.  Spirometry today shows an FEV1 of 41%, ratio 51, FVC was decreased at 63% , compatible with severe airway obstruction.  Has Bad reflux daily . On fosamax.  CXR on 02/25/14 >NAD , chronic changes    Review of Systems  Constitutional:   No  weight loss, night sweats,  Fevers, chills,  +fatigue, or  lassitude.  HEENT:   No headaches,  Difficulty swallowing,  Tooth/dental problems,  or  Sore throat,                No sneezing, itching, ear ache, nasal congestion, post nasal drip,   CV:  No chest pain,  Orthopnea, PND, swelling in lower extremities, anasarca, dizziness, palpitations, syncope.   GI   NO nausea, vomiting, diarrhea, change in bowel habits, loss of appetite, bloody stools.   Resp:    No chest wall deformity  Skin: no rash or lesions.  GU: no dysuria, change in color of urine, no urgency or frequency.  No flank pain, no hematuria   MS:  No joint pain or swelling.  No decreased range of motion.  No back pain.  Psych:  No change in mood or affect. No depression or anxiety.  No memory loss.          Objective:   Physical Exam  GEN: A/Ox3; pleasant , NAD  HEENT:  [place]/AT,  EACs-clear, TMs-wnl, NOSE-clear, THROAT-clear, no lesions, no postnasal drip  or exudate noted.   NECK:  Supple w/ fair ROM; no JVD; normal carotid impulses w/o bruits; no thyromegaly or nodules palpated; no lymphadenopathy.  RESP Diminished BS in base , no accessory muscle use, no dullness to percussion  CARD:  RRR, no m/r/g  , no peripheral edema, pulses intact, no cyanosis or clubbing.  GI:   Soft & nt; nml bowel sounds; no organomegaly or masses detected.  Musco: Warm bil, no deformities or joint swelling noted.   Neuro: alert, no focal deficits noted.    Skin: Warm, no lesions or rashes   CXR 02/25/14 NAD        Assessment & Plan:

## 2014-03-11 NOTE — Patient Instructions (Signed)
Finish Doxycycline and Prednisone .  Mucinex DM Twice daily  As needed  Cough/congestion  Hold Fosamax until cough is improved .  Continue on Symbicort and Spriva , rinse after use.  Begin Prilosec 20mg  daily -before meal .  GERD diet.  Please contact office for sooner follow up if symptoms do not improve or worsen or seek emergency care  Follow up Dr. Chase Caller in 4-6 weeks and As needed

## 2014-03-14 NOTE — Assessment & Plan Note (Addendum)
Slow to resolve flare  Manage GERD   Plan  Finish Doxycycline and Prednisone .  Mucinex DM Twice daily  As needed  Cough/congestion  Hold Fosamax until cough is improved .  Continue on Symbicort and Spriva , rinse after use.  Begin Prilosec 20mg  daily -before meal .  GERD diet.  Please contact office for sooner follow up if symptoms do not improve or worsen or seek emergency care  Follow up Dr. Chase Caller in 4-6 weeks and As needed

## 2014-03-15 ENCOUNTER — Encounter (HOSPITAL_COMMUNITY)
Admission: RE | Admit: 2014-03-15 | Discharge: 2014-03-15 | Disposition: A | Discharge status: Home / Self Care | Payer: 59 | Source: Ambulatory Visit | Attending: Internal Medicine | Admitting: Internal Medicine

## 2014-03-15 ENCOUNTER — Encounter (HOSPITAL_COMMUNITY): Payer: Self-pay

## 2014-03-15 NOTE — Progress Notes (Signed)
Today, Stephen [name] exercised at [employer]. [facility].  The patient exercised for more than 31 minutes on different pieces of equipment. Oxygen saturation, heart rate, blood pressure, rate of perceived exertion, and shortness of breath were all monitored before, during, and after exercise. Stephen [name] presented with no problems at today's exercise session.    Dr. Brand Males, Medical Director Dr. Dyann Kief is immediately available during today's Pulmonary Rehab session for [facility].

## 2014-03-17 ENCOUNTER — Encounter (HOSPITAL_COMMUNITY)
Admission: RE | Admit: 2014-03-17 | Discharge: 2014-03-17 | Disposition: A | Discharge status: Home / Self Care | Payer: 59 | Source: Ambulatory Visit | Attending: Internal Medicine | Admitting: Internal Medicine

## 2014-03-17 ENCOUNTER — Encounter (HOSPITAL_COMMUNITY): Payer: Self-pay | Admitting: *Deleted

## 2014-03-17 NOTE — Progress Notes (Signed)
Today, Stephen [name] exercised at [employer].  The patient exercised for more than 31 minutes on different pieces of equipment. Oxygen saturation, heart rate, blood pressure, rate of perceived exertion, and shortness of breath were all monitored before, during, and after exercise. [name] presented with no problems at today's exercise session.   Patient attended MD lecture day with Dr. Alva Garnet. Dr. Brand Males, Medical Director Dr. Aileen Fass is immediately available during today's Pulmonary Rehab session for [name].

## 2014-03-22 ENCOUNTER — Encounter (HOSPITAL_COMMUNITY)
Admission: RE | Admit: 2014-03-22 | Discharge: 2014-03-22 | Disposition: A | Discharge status: Home / Self Care | Payer: 59 | Source: Ambulatory Visit | Attending: Internal Medicine | Admitting: Internal Medicine

## 2014-03-22 NOTE — Progress Notes (Signed)
Today, Stephen [name] exercised at [employer]. Service time was from 1400 to 1530.  The patient exercised for more than 31 minutes on different pieces of equipment. Atom also performed strength training for 10 minutes.  Oxygen saturation, heart rate, blood pressure, rate of perceived exertion, and shortness of breath were all monitored before, during, and after exercise. [name] presented with no problems at today's exercise session.   Pre-exercise vitals:   Weight kg: 95.5   Liters of O2: ra   SpO2: 98   HR: 84   BP: 118/68   CGB: NA  Exercise vitals:   Highest heartrate:  112   Lowest oxygen saturation: 96   Highest blood pressure: 148/80   Liters of O2: RA  Post-exercise vitals:   SpO2: 97   HR: 90   BP: 102/60   CGB: NA Dr. Brand Males, Medical Director Dr. Aileen Fass is immediately available during today's Pulmonary Rehab session for [facility].

## 2014-03-24 ENCOUNTER — Encounter (HOSPITAL_COMMUNITY): Payer: Self-pay | Admitting: *Deleted

## 2014-03-24 ENCOUNTER — Encounter (HOSPITAL_COMMUNITY)
Admission: RE | Admit: 2014-03-24 | Discharge: 2014-03-24 | Disposition: A | Discharge status: Home / Self Care | Payer: 59 | Source: Ambulatory Visit | Attending: Internal Medicine | Admitting: Internal Medicine

## 2014-03-24 NOTE — Progress Notes (Signed)
Today, Idan exercised at [employer]. Service time was from 1330 to 1530.  The patient exercised for more than 31 minutes on different pieces of equipment. Sylar also performed strength training for 10 minutes.  Oxygen saturation, heart rate, blood pressure, rate of perceived exertion, and shortness of breath were all monitored before, during, and after exercise. Jaston presented with no problems at today's exercise session. Patient attended nutrition lecture.  Pre-exercise vitals:   Weight kg: 94.6   Liters of O2:RA   SpO2: 95   HR: 92   BP: 102/60   CGB: NA  Exercise vitals:   Highest heartrate:  107   Lowest oxygen saturation: 95   Highest blood pressure: 112/66   Liters of O2: RA  Post-exercise vitals:   SpO2: 97   HR: 95   BP: 100/60   CGB: NA Dr. Brand Males, Medical Director Dr. Ree Kida is immediately available during today's Pulmonary Rehab session for [facility].

## 2014-03-29 ENCOUNTER — Encounter (HOSPITAL_COMMUNITY): Payer: Self-pay

## 2014-03-29 ENCOUNTER — Encounter (HOSPITAL_COMMUNITY)
Admission: RE | Admit: 2014-03-29 | Discharge: 2014-03-29 | Disposition: A | Discharge status: Home / Self Care | Payer: 59 | Source: Ambulatory Visit | Attending: Internal Medicine | Admitting: Internal Medicine

## 2014-03-29 NOTE — Progress Notes (Signed)
Pt arrived at pulmonary rehab c/o nasal congestion, yellow sputum and nasal drainage x4 days associated with night sweats last night.  Pt has congested cough.  Lungs-course wheeze throughout, rales/rhonchi on right.  No accessory muscle use or distress noted.  Temp-97.5.  Pt able to exercise today. Pt states he took last doxycycline dose on Sunday.   Pt declined offer to contact Dr. Golden Pop office for him. Pt states he will contact PCP.  Understanding verbalized

## 2014-03-29 NOTE — Progress Notes (Signed)
Today, Stephen [name] exercised at [employer]. Service time was from 1:30pm to 3:30pm.  The patient exercised for more than 31 minutes performing aerobic, strengthening, and stretching exercises. Oxygen saturation, heart rate, blood pressure, rate of perceived exertion, and shortness of breath were all monitored before, during, and after exercise. Stephen [name] presented with no problems at today's exercise session.   Pre-exercise vitals:   Weight kg: 95.4   Liters of O2: 0   SpO2: 96   HR: 104   BP: 120/84   CBG: na  Exercise vitals:   Highest heartrate:  134   Lowest oxygen saturation: 95   Highest blood pressure: 150/76   Liters of 02: 0  Post-exercise vitals:   SpO2: 96   HR: 107   BP: 132/82   Liters of O2: 0   CBG: na  Dr. Brand Males, Medical Director Dr. Ree Kida is immediately available during today's Pulmonary Rehab session for [place] on 03/29/14 at 1:30pm class time.

## 2014-03-31 ENCOUNTER — Encounter (HOSPITAL_COMMUNITY): Payer: Self-pay

## 2014-03-31 ENCOUNTER — Encounter (HOSPITAL_COMMUNITY)
Admission: RE | Admit: 2014-03-31 | Discharge: 2014-03-31 | Disposition: A | Discharge status: Home / Self Care | Payer: 59 | Source: Ambulatory Visit | Attending: Internal Medicine | Admitting: Internal Medicine

## 2014-03-31 DIAGNOSIS — J449 Chronic obstructive pulmonary disease, unspecified: Secondary | ICD-10-CM | POA: Insufficient documentation

## 2014-03-31 DIAGNOSIS — Z5189 Encounter for other specified aftercare: Secondary | ICD-10-CM | POA: Insufficient documentation

## 2014-03-31 DIAGNOSIS — J4489 Other specified chronic obstructive pulmonary disease: Secondary | ICD-10-CM | POA: Insufficient documentation

## 2014-03-31 NOTE — Progress Notes (Signed)
Today, Stephen [name] exercised at [employer]. Service time was from 1:30 to 3:30.  The patient exercised for more than 31 minutes performing aerobic, strengthening, and stretching exercises. Oxygen saturation, heart rate, blood pressure, rate of perceived exertion, and shortness of breath were all monitored before, during, and after exercise. Stephen [name] presented with no problems at today's exercise session.   There was no workload change during today's exercise session.  Pre-exercise vitals:   Weight kg: 93.5   Liters of O2: 0   SpO2: 96   HR: 73   BP: 98/60   CBG: na  Exercise vitals:   Highest heartrate:  103   Lowest oxygen saturation: 96   Highest blood pressure: 132/70   Liters of 02: 0  Post-exercise vitals:   SpO2: 95   HR: 78   BP: 114/80   Liters of O2: 0   CBG: na  Dr. Brand Males, Medical Director Dr. Aileen Fass is immediately available during today's Pulmonary Rehab session for [place] on 03/31/14 at 1:30 class time.

## 2014-04-04 NOTE — Psychosocial Assessment (Signed)
Stephen [name] 58 y.o. male  30 day Psychosocial Note  Patient psychosocial assessment reveals no barriers to participation in Pulmonary Rehab.  Patient does continue to exhibit positive coping skills to deal with his COPD. Offered emotional support and reassurance. Patient does feel he is making progress toward Pulmonary Rehab goals. Patient reports his health and activity level has improved in the past 30 days as evidenced by patient's report of increased ability to perform some simple chores in the yard.  Patient states family/friends have not noticed changes in his activity or mood.Patient lives alone and has not been around anyone that would notice any changes in his health or behavior.   Patient reports  feeling positive about current and projected progression in Pulmonary Rehab. After reviewing the patient's treatment plan, the patient is making progress toward Pulmonary Rehab goals.  Patient's rate of progress toward rehab goals is good.  While in the program he has increased his workloads on the nustep, treadmill, and airdyne stationary bicycle and has been able to maintain his oxygen saturations on room air in the mid to high 90's.  Plan of action to help patient continue to work towards rehab goals include increasing workloads as appropriate and continue with education re: COPD. Will continue to monitor and evaluate progress toward psychosocial goal(s).  Goal(s) in progress: Increase workloads as appropriate to increase endurance and stamina Education re: COPD process Help patient work toward returning to meaningful activities that improve patient's QOL and are attainable with patient's lung disease

## 2014-04-05 ENCOUNTER — Encounter (HOSPITAL_COMMUNITY): Payer: Self-pay

## 2014-04-05 ENCOUNTER — Encounter (HOSPITAL_COMMUNITY)
Admission: RE | Admit: 2014-04-05 | Discharge: 2014-04-05 | Disposition: A | Discharge status: Home / Self Care | Payer: 59 | Source: Ambulatory Visit | Attending: Internal Medicine | Admitting: Internal Medicine

## 2014-04-05 NOTE — Progress Notes (Signed)
Stephen [name] 63 years Nutrition Note  Spoke with pt. Pt is overweight. Pt wants to lose weight, but is not actively trying to lose weight right now. Pt reports he is tired and fatigued most of the time. He reports that sometimes he lays in bed all day as he cannot get up due to tiredness. During those times, pt reports he would have a decreased appetite and would not eat. Pt was encouraged on not to skip meals and the importance of eating to help fuel his body. Pt reports he usually eats out most of the week and does not like cooking at home as he reports his food that he makes is not appetizing to him. Pt reports it is hard to break his habit of eating out as he has been doing it his whole life. Pt was educated on ways he can eat healthier while eating out, such as trying to eat more salads in place of meals every now and then instead of burgers and trying to eat more healthy during the week and then eat what he wants on the weekends. Pt was educated that fast food and restaurant foods usually contain high amounts of sodium. Pt was encouraged to limit his sodium intake. Pt was given a handout "Eating out while heart healthy". Pt expressed understanding of the information reviewed.   Nutrition Diagnosis  Food-and nutrition-related knowledge deficit related to lack of exposure to information as related to diagnosis of pulmonary disease  Nutrition Rx/Est. Daily Nutrition Needs for: ? wt loss 2100-2400 Kcal 100-115 gm protein 2000 mg or less sodium   Nutrition Intervention  Pt's individual nutrition plan and goals reviewed with pt.  Benefits of adopting healthy eating habits discussed when pt's Rate Your Plate reviewed.  Pt given educational handout for: Eating out while heart heatlhy Continual client-centered nutrition education by RD, as part of interdisciplinary care.  Goal(s)  1. Describe the benefit of including fruits, vegetables, whole grains, and low-fat dairy products in a healthy meal  plan.  Monitor and Evaluate progress toward nutrition goal with team.   Kallie Locks Dietetic Intern  04/05/2014  3:32PM

## 2014-04-05 NOTE — Psychosocial Assessment (Signed)
Staff MD, Rehab Director Note/Attestation  Reviewed pshyosocial assessment. Agree with goal  Laid out by the staff of rehab. Of note, he is struggling with optimal copd control esp due to recent stress with his dad's illness. However, he is motivated to "set right" his life and is very motivated for rehab. He has expressed these directly to me  Dr. Brand Males, M.D., [facility].C.P Pulmonary and Critical Care Medicine Staff Physician and Director of [place] [facility] Pager: [phone], If no answer or between  15:00h - 7:00h: call [phone]  04/05/2014 10:59 AM

## 2014-04-05 NOTE — Progress Notes (Signed)
Today, Stephen [name] exercised at [employer]. Service time was from 1:30 to 3:30.  The patient exercised for more than 31 minutes performing aerobic, strengthening, and stretching exercises. Oxygen saturation, heart rate, blood pressure, rate of perceived exertion, and shortness of breath were all monitored before, during, and after exercise. [name] presented with no problems at today's exercise session.   There was an increase workload change during today's exercise session.  Pre-exercise vitals:   Weight kg: 94.0   Liters of O2: 0   SpO2: 98   HR: 80   BP: 102/64   CBG: na  Exercise vitals:   Highest heartrate:  116   Lowest oxygen saturation: 96   Highest blood pressure: 110/60   Liters of 02: 0  Post-exercise vitals:   SpO2: 96   HR: 90   BP: 98/60   Liters of O2: 0   CBG: na  Dr. Brand Males, Medical Director Dr. Aileen Fass is immediately available during today's Pulmonary Rehab session for [name] on 04/05/14 at 1:30 class time.

## 2014-04-07 ENCOUNTER — Encounter (HOSPITAL_COMMUNITY): Payer: Self-pay

## 2014-04-07 ENCOUNTER — Encounter (HOSPITAL_COMMUNITY)
Admission: RE | Admit: 2014-04-07 | Discharge: 2014-04-07 | Disposition: A | Discharge status: Home / Self Care | Payer: 59 | Source: Ambulatory Visit | Attending: Internal Medicine | Admitting: Internal Medicine

## 2014-04-07 NOTE — Progress Notes (Signed)
Today, Stephen [name] exercised at [employer]. Service time was from 1:30 to 3:30.  The patient exercised for more than 31 minutes performing aerobic, strengthening, and stretching exercises. Oxygen saturation, heart rate, blood pressure, rate of perceived exertion, and shortness of breath were all monitored before, during, and after exercise. Stephen [name] presented with no problems at today's exercise session. The patient attended "Advanced Directives" today with Jeanella Craze.  There was no workload change during today's exercise session.  Pre-exercise vitals:   Weight kg: 94.2   Liters of O2: 0   SpO2: 98   HR: 84   BP: 112/64   CBG: na  Exercise vitals:   Highest heartrate:  122   Lowest oxygen saturation: 96   Highest blood pressure: 146/80   Liters of 02: 0  Post-exercise vitals:   SpO2: 94   HR: 80   BP: 114/80   Liters of O2: 0   CBG: na  Dr. Brand Males, Medical Director Dr. Dyann Kief is immediately available during today's Pulmonary Rehab session for [name] on 04/07/14 at 1:30 class time.

## 2014-04-11 ENCOUNTER — Encounter: Payer: Self-pay | Admitting: Internal Medicine

## 2014-04-11 ENCOUNTER — Ambulatory Visit (INDEPENDENT_AMBULATORY_CARE_PROVIDER_SITE_OTHER): Payer: 59 | Admitting: Internal Medicine

## 2014-04-11 VITALS — BP 118/80 | HR 73 | Ht 70.0 in | Wt 207.8 lb

## 2014-04-11 DIAGNOSIS — J449 Chronic obstructive pulmonary disease, unspecified: Secondary | ICD-10-CM

## 2014-04-11 MED ORDER — FLUTICASONE PROPIONATE 50 MCG/ACT NA SUSP: 2.0000 | Freq: Every day | NASAL | Status: DC

## 2014-04-11 NOTE — Progress Notes (Signed)
Subjective:    Patient ID: Stephen [name], male    DOB: Jan 02, 1956, 58 y.o.   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 Unchanged PFTs 2009 -> Feb 2012. Feb 2012 PFTs - fev1 1.97L/57%, Ratio 78%  - clas 2 dyspnea, Rx with spiriva and symbicort   -  Low risk nuclear med scan heard 08/17/13 - Dr Debara Pickett  B)  Quit smoking 2006   C) Rt lung cancer 2006. Ex--throat cancer 2007. RLL 68mm nodule feb 2012-> resolved May 2012  -> In remission sept 2014: Dr Earlie Server  D) Chronic sinus alllergies - Dr Donneta Romberg, Dr. Redmond Baseman.   - last allergy test fall 2012 - plan to increase shots and positive  - on weekly allergyt shots since 2006 per patient. But only poisitive to roach or silk  E) Recc AECOPD  - Aug 2012 x 2 - Dec 2012 - Early 2015: several Rx by PCP Jerlyn Ly, MD per his hx   F) Osteoporosis per hx  G) EOSINOPHILIA  - 400 in sept 2014    OV 02/25/2014 Followup moderate COPD and MZ. Phenotype. I have not seen him in June 2013. He is a very poor historian and is giving conflicting information. His most recent issues that since early January 2015 he is had symptoms of COPD flare up with increasing cough tightness in the chest and green sputum. He is been treated with several courses of antibiotics and prednisone since then but without any relief. He freely admits that he is poorly adherent to his Spiriva and Symbicort regimen. On 02/03/2014 his primary care physician switched him to [place] instead of Symbicort but he's not sure which one he is taking. He is also not sure what regimen he takes and frequency. He changes his story it and the question asked. Nevertheless, he is frustrated about persistent cough and wheezing since then. Currently his rhythm is clear and small in amount. No active fever. Apparently a chest x-ray primary care office a few weeks ago and this was normal. This and is interested in pulmonary rehabilitation  Of note, he is due to have a colonoscopy and  he wants clearance  COPD Cat score is 26 and reflect moderate symp burdern     No results found. CXR - clear  REC Do not know why you are having slow to resolve flare up Might be related to poor adherence with your baseline inhalers ENsure you take spiriva  And breo/symbicort (any one) daily Use albuterol as needed Do CXR today Refer to pulmonary rehab for moderate copd Please take prednisone 40 mg x1 day, then 30 mg x1 day, then 20 mg x1 day, then 10 mg x1 day, and then 5 mg x1 day and stop  #Preoperative pulmonary evaluation for colonoscopy  Low risk procedure as long as there is no active wheezing or hypoxemia  Followup  2 weeks with spirometry to see me or my NP Tammy   03/11/2014 Follow up  Returns for 2 week follow up COPD.  Reports began Doxycycline and pred taper on 3/9 by PCP for COPD flare . Still taking both meds Taking symbicort and spirva .  Has insurance but depends on samples.  Spirometry today shows an FEV1 of 41%, ratio 51, FVC was decreased at 63% , compatible with severe airway obstruction.  Has Bad reflux daily . On fosamax.  CXR on 02/25/14 >NAD , chronic change   REC Finish Doxycycline and Prednisone .  Mucinex DM Twice daily  As needed  Cough/congestion  Hold Fosamax until cough is improved .  Continue on Symbicort and Spriva , rinse after use.  Begin Prilosec 20mg  daily -before meal .  GERD diet.  Please contact office for sooner follow up if symptoms do not improve or worsen or seek emergency care  Follow up Dr. Chase Caller in 4-6 weeks and As needed    OV 04/11/2014  Followup moderate COPD with recurrent COPD exacerbation   Currently he reports that he is not an exacerbation but he continues to have constant wheezing. The recent ongoing heavy pollen season is taking a toll on his wheezing. Is reporting sinus drainage but he is not using his Nettie pot daily. He is not using nasal saline wash. Is not using nasal steroids. He maintains that he is  compliant with this Symbicort and Spiriva. A few weeks ago nurse practitioner noticed that he has bad acid reflux and had him hold his Fosamax and started him on Prilosec but I am not sure sure that he is followed through these instructions.  Of note I noticed that he is eosinophilia that is mild. He is on allergy shots for the last 9 years but I'm not so sure that this has helped him. He says that allergy testing only showed positivity for cockroach. He's due for his next allergy shot in a few days. He gets this once a week  He is interested in copd research trials but not sure  CAT COPD Symptom & Quality of Life Score ([place]) 0 is no burden. 5 is highest burden 02/25/2014  04/11/2014   Never Cough -> Cough all the time 3 2  No phlegm in chest -> Chest is full of phlegm 3 3  No chest tightness -> Chest feels very tight 3 2  No dyspnea for 1 flight stairs/hill -> Very dyspneic for 1 flight of stairs 4 3  No limitations for ADL at home -> Very limited with ADL at home 5 5  Confident leaving home -> Not at all confident leaving home 2 1  HSleep soundly -> Do not sleep soundly because of lung condition 3 2  Lots of Energy -> No energy at all 3 3  TOTAL Score (max 40)  25 21    Review of Systems  Constitutional: Negative for fever and unexpected weight change.  HENT: Positive for congestion, postnasal drip and rhinorrhea. Negative for dental problem, ear pain, nosebleeds, sinus pressure, sneezing, sore throat and trouble swallowing.   Eyes: Negative for redness and itching.  Respiratory: Positive for cough and shortness of breath. Negative for chest tightness and wheezing.   Cardiovascular: Negative for palpitations and leg swelling.  Gastrointestinal: Negative for nausea and vomiting.  Genitourinary: Negative for dysuria.  Musculoskeletal: Negative for joint swelling.  Skin: Negative for rash.  Neurological: Negative for headaches.  Hematological: Does not bruise/bleed easily.   Psychiatric/Behavioral: Negative for dysphoric mood. The patient is not nervous/anxious.        Objective:   Physical Exam  Nursing note and vitals reviewed. Constitutional: He is oriented to person, place, and time. He appears well-developed and well-nourished. No distress.  HENT:  Head: Normocephalic and atraumatic.  Right Ear: External ear normal.  Left Ear: External ear normal.  Mouth/Throat: Oropharynx is clear and moist. No oropharyngeal exudate.  Eyes: Conjunctivae and EOM are normal. Pupils are equal, round, and reactive to light. Right eye exhibits no discharge. Left eye exhibits no discharge. No scleral icterus.  Neck: Normal range of motion. Neck supple. No JVD present. No tracheal deviation present. No thyromegaly present.  Cardiovascular: Normal rate, regular rhythm and intact distal pulses.  Exam reveals no gallop and no friction rub.   No murmur heard. Pulmonary/Chest: Effort normal. No respiratory distress. He has wheezes. He has no rales. He exhibits no tenderness.  bialteral wheeze; appears on several prior exams and might be chronic issue Hoarse voice  Abdominal: Soft. Bowel sounds are normal. He exhibits no distension and no mass. There is no tenderness. There is no rebound and no guarding.  Musculoskeletal: Normal range of motion. He exhibits no edema and no tenderness.  Lymphadenopathy:    He has no cervical adenopathy.  Neurological: He is alert and oriented to person, place, and time. He has normal reflexes. No cranial nerve deficit. Coordination normal.  Skin: Skin is warm and dry. No rash noted. He is not diaphoretic. No erythema. No pallor.  Psychiatric: He has a normal mood and affect. His behavior is normal. Judgment and thought content normal.         Assessment & Plan:

## 2014-04-11 NOTE — Patient Instructions (Addendum)
Not sure why you ar constantly wheezing and having recurrent flare up   - could be progressive diseasein MZ alpha 1 setting or ongoing sinus issues or baseline eosinophilia (5% in 2014) hat predisposes you to recurrent exacerbations Need to retest your pft in few months I will talk to Dr Donneta Romberg if allergy shots are of benefit to you or not Ensure you are taking your copd inhalers all the time and see what his take on you participating in anti-eosinophil antibody COPD research study aimed at reducing copd exacerbations REstart sinus drainage treatment  - this is very important you do that - do netti pot daily  - take generic fluticasone inhaler 2 squirts each nostril daily   Followup  - 2-3 months with full PFT - if PFT declining compared to baseline, will have to discuss alpha 1 treatment options

## 2014-04-12 ENCOUNTER — Encounter (HOSPITAL_COMMUNITY)
Admission: RE | Admit: 2014-04-12 | Discharge: 2014-04-12 | Disposition: A | Discharge status: Home / Self Care | Payer: 59 | Source: Ambulatory Visit | Attending: Internal Medicine | Admitting: Internal Medicine

## 2014-04-12 ENCOUNTER — Encounter (HOSPITAL_COMMUNITY): Payer: Self-pay

## 2014-04-12 NOTE — Progress Notes (Signed)
Today, Stephen [name] exercised at [employer]. Service time was from 1:30 to 3:30.  The patient exercised for more than 31 minutes performing aerobic, strengthening, and stretching exercises. Oxygen saturation, heart rate, blood pressure, rate of perceived exertion, and shortness of breath were all monitored before, during, and after exercise. Stephen [name] presented with no problems at today's exercise session.   There was no workload change during today's exercise session.  Pre-exercise vitals:   Weight kg: 94.0   Liters of O2: ra   SpO2: 97   HR: 88   BP: 104/60   CBG: na  Exercise vitals:   Highest heartrate:  118   Lowest oxygen saturation: 97   Highest blood pressure: 154/84   Liters of 02: ra  Post-exercise vitals:   SpO2: 98   HR: 95   BP: 124/78   Liters of O2: ra   CBG: na  Dr. Brand Males, Medical Director Dr. Dyann Kief is immediately available during today's Pulmonary Rehab session for Stephen [name] on 04/12/14 at 1:30pm class time.

## 2014-04-14 ENCOUNTER — Encounter (HOSPITAL_COMMUNITY): Payer: Self-pay

## 2014-04-14 ENCOUNTER — Encounter (HOSPITAL_COMMUNITY)
Admission: RE | Admit: 2014-04-14 | Discharge: 2014-04-14 | Disposition: A | Discharge status: Home / Self Care | Payer: 59 | Source: Ambulatory Visit | Attending: Internal Medicine | Admitting: Internal Medicine

## 2014-04-14 NOTE — Progress Notes (Signed)
Today, Stephen [name] exercised at [employer]. Service time was from 1:30 to 3:30.  The patient exercised for more than 31 minutes performing aerobic, strengthening, and stretching exercises. Oxygen saturation, heart rate, blood pressure, rate of perceived exertion, and shortness of breath were all monitored before, during, and after exercise. Frans presented with no problems at today's exercise session.   There was no workload change during today's exercise session.  Pre-exercise vitals:   Weight kg: 94.1   Liters of O2: ra   SpO2: 97   HR: 70   BP: 116/74   CBG: na  Exercise vitals:   Highest heartrate:  115   Lowest oxygen saturation: 96   Highest blood pressure: 126/64   Liters of 02: ra  Post-exercise vitals:   SpO2: 99   HR: 76   BP: 98/54   Liters of O2: ra   CBG: na  Dr. Brand Males, Medical Director Dr. Aileen Fass is immediately available during today's Pulmonary Rehab session for [place] on 04/14/14 at 1:30 class time.

## 2014-04-17 ENCOUNTER — Telehealth: Payer: Self-pay | Admitting: Internal Medicine

## 2014-04-17 NOTE — Telephone Encounter (Signed)
Please give him fu to see me in 6-8 weeks but not 2-3 months as indicated in my OV  Thanks  Dr. Brand Males, M.D., [facility].C.P Pulmonary and Critical Care Medicine Staff Physician [place] [facility] Pager: [phone], If no answer or between  15:00h - 7:00h: call [phone]  04/17/2014 8:37 PM    PS ; d/w Dr Donneta Romberg and then again with patient. GAlathea trial explained. Will need to come off allergy shots which patient thinks might be helping him but Dr Donneta Romberg is neutral on benefit. PAtient explained study. He is pondering

## 2014-04-17 NOTE — Assessment & Plan Note (Signed)
Not sure why you ar constantly wheezing and having recurrent flare up   - could be progressive diseasein MZ alpha 1 setting or ongoing sinus issues or baseline eosinophilia (5% in 2014) hat predisposes you to recurrent exacerbations Need to retest your pft in few months I will talk to Dr Donneta Romberg if allergy shots are of benefit to you or not Ensure you are taking your copd inhalers all the time and see what his take on you participating in anti-eosinophil antibody COPD research study aimed at reducing copd exacerbations REstart sinus drainage treatment  - this is very important you do that - do netti pot daily  - take generic fluticasone inhaler 2 squirts each nostril daily   Followup  - 2-3 months with full PFT\ - if repeated AECOPD despite compliance and optimization might have to consider him participating in a research GAl[place]a study  - if PFT declining compared to baseline, will have to discuss alpha 1 treatment options   > 50% of this > 25 min visit spent in face to face counseling (15 min visit converted to 25 min)

## 2014-04-19 ENCOUNTER — Encounter (HOSPITAL_COMMUNITY)
Admission: RE | Admit: 2014-04-19 | Discharge: 2014-04-19 | Disposition: A | Discharge status: Home / Self Care | Payer: 59 | Source: Ambulatory Visit | Attending: Internal Medicine | Admitting: Internal Medicine

## 2014-04-19 NOTE — Progress Notes (Signed)
Today, Bracen exercised at [facility]. Service time was from 1330 to 1515.  The patient exercised for more than 31 minutes performing aerobic, strengthening, and stretching exercises. Oxygen saturation, heart rate, blood pressure, rate of perceived exertion, and shortness of breath were all monitored before, during, and after exercise. Stephen [name] presented with no problems at today's exercise session.   There was 2 workload changes during today's exercise session.  Pre-exercise vitals:   Weight kg: 93.7   Liters of O2: RA   SpO2: 95   HR: 81   BP: 108/60   CBG: NA  Exercise vitals:   Highest heartrate:  101   Lowest oxygen saturation: 95   Highest blood pressure: 122/60   Liters of 02: RA  Post-exercise vitals:   SpO2: 96   HR: 86   BP: 102/60   Liters of O2: RA   CBG: NA Dr. Brand Males, Medical Director Dr. Aileen Fass is immediately available during today's Pulmonary Rehab session for [place] on 04/19/2014 at 1:30 pm class time.

## 2014-04-19 NOTE — Progress Notes (Signed)
I have reviewed a Home Exercise Prescription with Lisbeth Ply . Trino is not currently exercising at home.  The patient was advised to walk 2-3 days a week for 20 minutes.  Majid and I discussed how to progress their exercise prescription.  The patient stated that their goals were to get out of the rut he is in and improve his quality of life.  The patient stated that they understand the exercise prescription.  We reviewed exercise guidelines, target heart rate during exercise, oxygen use, weather, home pulse oximeter, endpoints for exercise, and goals.  Patient is encouraged to come to me with any questions. I will continue to follow up with the patient to assist them with progression and safety.

## 2014-04-21 ENCOUNTER — Encounter (HOSPITAL_COMMUNITY)
Admission: RE | Admit: 2014-04-21 | Discharge: 2014-04-21 | Disposition: A | Discharge status: Home / Self Care | Payer: 59 | Source: Ambulatory Visit | Attending: Internal Medicine | Admitting: Internal Medicine

## 2014-04-21 NOTE — Progress Notes (Signed)
Today, Stephen [name] exercised at [facility]. Service time was from 1400 to 1530.  The patient exercised for more than 31 minutes performing aerobic, strengthening, and stretching exercises. Oxygen saturation, heart rate, blood pressure, rate of perceived exertion, and shortness of breath were all monitored before, during, and after exercise. Stephen [name] presented with no problems at today's exercise session. Stephen [name] attended the medication lecture today.  There was no workload change during today's exercise session.  Pre-exercise vitals:   Weight kg: 93.6   Liters of O2: RA   SpO2: 98   HR: 76   BP: 118/68   CBG: NA  Exercise vitals:   Highest heartrate:  117   Lowest oxygen saturation: 97   Highest blood pressure: 128/70   Liters of 02: RA  Post-exercise vitals:   SpO2: 97   HR: 96   BP: 118/58   Liters of O2: RA   CBG: NA Dr. Brand Males, Medical Director Dr. Sheran Fava is immediately available during today's Pulmonary Rehab session for Stephen [name] on 04/21/2014 at 1:30 pm class time.

## 2014-04-21 NOTE — Telephone Encounter (Signed)
In addition to f/u, to clarify pt also needs PFT in 6-8 weeks as well? thanks

## 2014-04-22 NOTE — Telephone Encounter (Signed)
Yes will need full PFT at fu

## 2014-04-22 NOTE — Telephone Encounter (Signed)
Spoke with pt. appt and OV and PFT for 04/29/14. Nothing further needed

## 2014-04-26 ENCOUNTER — Encounter (HOSPITAL_COMMUNITY)
Admission: RE | Admit: 2014-04-26 | Discharge: 2014-04-26 | Disposition: A | Discharge status: Home / Self Care | Payer: 59 | Source: Ambulatory Visit | Attending: Internal Medicine | Admitting: Internal Medicine

## 2014-04-26 NOTE — Progress Notes (Signed)
Today, Aja exercised at [facility]. Service time was from 1330 to 1515.  The patient exercised for more than 31 minutes performing aerobic, strengthening, and stretching exercises. Oxygen saturation, heart rate, blood pressure, rate of perceived exertion, and shortness of breath were all monitored before, during, and after exercise. Guhan presented with no problems at today's exercise session.   There was no workload change during today's exercise session.  Pre-exercise vitals:   Weight kg: 92.8   Liters of O2: RA   SpO2: 97   HR: 73   BP: 118/70   CBG: NA  Exercise vitals:   Highest heartrate:  113   Lowest oxygen saturation: 93   Highest blood pressure: 102/64   Liters of 02: RA  Post-exercise vitals:   SpO2: 98   HR: 88   BP: 102/70   Liters of O2: RA   CBG: NA Dr. Brand Males, Medical Director Dr. Sheran Fava is immediately available during today's Pulmonary Rehab session for [place] on 04/26/2014 at 1330 class time.

## 2014-04-28 ENCOUNTER — Encounter (HOSPITAL_COMMUNITY)
Admission: RE | Admit: 2014-04-28 | Discharge: 2014-04-28 | Disposition: A | Discharge status: Home / Self Care | Payer: 59 | Source: Ambulatory Visit | Attending: Internal Medicine | Admitting: Internal Medicine

## 2014-04-28 NOTE — Progress Notes (Signed)
Today, Estevon exercised at [facility]. Service time was from 1330 to 1530.  The patient exercised for more than 31 minutes performing aerobic, strengthening, and stretching exercises. Oxygen saturation, heart rate, blood pressure, rate of perceived exertion, and shortness of breath were all monitored before, during, and after exercise. Stephen [name] presented with no problems at today's exercise session. Patient attended the exercise lecture today.   There was no workload change during today's exercise session.  Pre-exercise vitals:   Weight kg: 92.9   Liters of O2: RA   SpO2: 97   HR: 68   BP: 104/60   CBG: NA  Exercise vitals:   Highest heartrate:  113   Lowest oxygen saturation: 93   Highest blood pressure: 138/80   Liters of 02: RA  Post-exercise vitals:   SpO2: 97   HR: 77   BP: 110/60   Liters of O2: RA   CBG: NA Dr. Brand Males, Medical Director Dr. Aileen Fass is immediately available during today's Pulmonary Rehab session for [place] on 04/28/2014 at 1330 class time.

## 2014-05-03 ENCOUNTER — Encounter (HOSPITAL_COMMUNITY)
Admission: RE | Admit: 2014-05-03 | Discharge: 2014-05-03 | Disposition: A | Discharge status: Home / Self Care | Payer: 59 | Source: Ambulatory Visit | Attending: Internal Medicine | Admitting: Internal Medicine

## 2014-05-03 DIAGNOSIS — J4489 Other specified chronic obstructive pulmonary disease: Secondary | ICD-10-CM | POA: Insufficient documentation

## 2014-05-03 DIAGNOSIS — Z5189 Encounter for other specified aftercare: Secondary | ICD-10-CM | POA: Insufficient documentation

## 2014-05-03 DIAGNOSIS — J449 Chronic obstructive pulmonary disease, unspecified: Secondary | ICD-10-CM | POA: Insufficient documentation

## 2014-05-03 NOTE — Progress Notes (Signed)
Today, Oaklen exercised at [facility]. Service time was from 1330 to 1515.  The patient exercised for more than 31 minutes performing aerobic, strengthening, and stretching exercises. Oxygen saturation, heart rate, blood pressure, rate of perceived exertion, and shortness of breath were all monitored before, during, and after exercise. Stephen [name] presented with no problems at today's exercise session.   There was a workload change during today's exercise session.  We increased his gradient on the treadmill to 5%, but his heart rate increased to 143, so we had to decrease that workload to 3% to bring heart rate to 138.  Pre-exercise vitals:   Weight kg: 92.4   Liters of O2: RA   SpO2: RA   HR: 88   BP: 118/58   CBG: NA  Exercise vitals:   Highest heartrate:  143   Lowest oxygen saturation: 94   Highest blood pressure: 160/72   Liters of 02: RA  Post-exercise vitals:   SpO2: 95   HR: 97   BP: 122/56   Liters of O2: RA   CBG: NA Dr. Brand Males, Medical Director Dr. Aileen Fass is immediately available during today's Pulmonary Rehab session for [place] on 05/03/2014 at 1330 class time.

## 2014-05-03 NOTE — Psychosocial Assessment (Signed)
Stephen [name] 58 y.o. male  62 day Psychosocial Note  Patient psychosocial assessment reveals barriers to participation in Pulmonary Rehab. Psychosocial areas that are currently affecting patient's rehab experience include concerns about  financial resources, and emotional problems as evidenced by depression.  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   Patient does continue to exhibit positive coping skills to deal with his psychosocial concerns. Offered emotional support and reassurance.  He does not want to start on an antidepressant even though his primary physician has suggested it.  Stephen [name] is very extroverted and enjoys being around groups of people, but feels his depression when he is home alone.  Encouraged seeking help to discuss depression with his physician.   Patient does feel he is making progress toward Pulmonary Rehab goals. Patient reports his health and activity level has improved in the past 30 days as evidenced by patient's report of increased workloads on the treadmill and nustep. Patient states family/friends have not noticed changes in his activity or mood. Patient reports  feeling positive about current and projected progression in Pulmonary Rehab. After reviewing the patient's treatment plan, the patient is making progress toward Pulmonary Rehab goals. Patient's rate of progress toward rehab goals is excellent. Plan of action to help patient continue to work towards rehab goals include continue to encourage counseling for depression and increase workloads as appropriate . Will continue to monitor and evaluate progress toward psychosocial goal(s).  Goal(s) in progress: Improved management of depression Improved coping skills Help patient work toward returning to meaningful activities that improve patient's QOL and are attainable with patient's lung disease

## 2014-05-05 ENCOUNTER — Encounter (HOSPITAL_COMMUNITY)
Admission: RE | Admit: 2014-05-05 | Discharge: 2014-05-05 | Disposition: A | Discharge status: Home / Self Care | Payer: 59 | Source: Ambulatory Visit | Attending: Internal Medicine | Admitting: Internal Medicine

## 2014-05-05 NOTE — Progress Notes (Signed)
Today, Stephen [name] exercised at [facility]. Service time was from 1:30 to 3:15.  The patient exercised for more than 31 minutes performing aerobic, strengthening, and stretching exercises. Oxygen saturation, heart rate, blood pressure, rate of perceived exertion, and shortness of breath were all monitored before, during, and after exercise. Stephen [name] presented with no problems at today's exercise session. The patient attended the education class "MD Lecture Day" with Dr. Chase Caller.  There was an increase in workload change during today's exercise session.  Pre-exercise vitals:   Weight kg: 93.2   Liters of O2: 0   SpO2: 97   HR: 78   BP: 118/80   CBG: na  Exercise vitals:   Highest heartrate:  119   Lowest oxygen saturation: 95   Highest blood pressure: 132/64   Liters of 02: 0  Post-exercise vitals:   SpO2: 98   HR: 90   BP: 110/78   Liters of O2: 0   CBG: na  Dr. Brand Males, Medical Director Dr. Coralyn Pear is immediately available during today's Pulmonary Rehab session for Stephen [name] on 05/05/14 at 1:30pm class time.

## 2014-05-10 ENCOUNTER — Encounter (HOSPITAL_COMMUNITY): Payer: 59

## 2014-05-12 ENCOUNTER — Encounter (HOSPITAL_COMMUNITY)
Admission: RE | Admit: 2014-05-12 | Discharge: 2014-05-12 | Disposition: A | Discharge status: Home / Self Care | Payer: 59 | Source: Ambulatory Visit | Attending: Internal Medicine | Admitting: Internal Medicine

## 2014-05-12 NOTE — Progress Notes (Signed)
Today, Desi exercised at [facility]. Service time was from 1330 to 1545.  The patient exercised for more than 31 minutes performing aerobic, strengthening, and stretching exercises. Oxygen saturation, heart rate, blood pressure, rate of perceived exertion, and shortness of breath were all monitored before, during, and after exercise. Thurston presented with no problems at today's exercise session. Patient attended the nutrition for pulmonary patients lecture today.   There was no workload change during today's exercise session.  Pre-exercise vitals:   Weight kg: 93.9   Liters of O2: RA   SpO2: 97   HR: 66   BP: 98/68   CBG: NA  Exercise vitals:   Highest heartrate:  126   Lowest oxygen saturation: 94   Highest blood pressure: 132/64   Liters of 02: RA  Post-exercise vitals:   SpO2: 97   HR: 75   BP: 120/64   Liters of O2: RA   CBG: NA Dr. Brand Males, Medical Director Dr. Aileen Fass is immediately available during today's Pulmonary Rehab session for [place] on 05/12/2014 at 1330 class time.

## 2014-05-12 NOTE — Psychosocial Assessment (Signed)
Staff MD, Rehab Director Note/Attestation  Reviewed pshyosocial assessment. Agree with goal  Laid out by the staff of rehab  Dr. Brand Males, M.D., [facility].C.P Pulmonary and Critical Care Medicine Staff Physician and Director of [facility] Pager: [phone], If no answer or between  15:00h - 7:00h: call [phone]  05/12/2014 4:53 PM

## 2014-05-17 ENCOUNTER — Encounter (HOSPITAL_COMMUNITY)
Admission: RE | Admit: 2014-05-17 | Discharge: 2014-05-17 | Disposition: A | Discharge status: Home / Self Care | Payer: 59 | Source: Ambulatory Visit | Attending: Internal Medicine | Admitting: Internal Medicine

## 2014-05-17 NOTE — Progress Notes (Signed)
Today, Stephen [name] exercised at [facility]. Service time was from 1330 to 1515.  The patient exercised for more than 31 minutes performing aerobic, strengthening, and stretching exercises. Oxygen saturation, heart rate, blood pressure, rate of perceived exertion, and shortness of breath were all monitored before, during, and after exercise. [name] presented with no problems at today's exercise session.   There was no workload change during today's exercise session.  Pre-exercise vitals:   Weight kg: 94.0   Liters of O2: RA   SpO2: 98   HR: 80   BP: 112/60   CBG: NA  Exercise vitals:   Highest heartrate:  135   Lowest oxygen saturation: 95   Highest blood pressure: 136/76   Liters of 02: RA  Post-exercise vitals:   SpO2: RA   HR: 88   BP: 110/56   Liters of O2: RA   CBG: NA Dr. Brand Males, Medical Director Dr. Aileen Fass is immediately available during today's Pulmonary Rehab session for Stephen [name] on 05/17/2014 at 1330 class time.

## 2014-05-19 ENCOUNTER — Encounter (HOSPITAL_COMMUNITY)
Admission: RE | Admit: 2014-05-19 | Discharge: 2014-05-19 | Disposition: A | Discharge status: Home / Self Care | Payer: 59 | Source: Ambulatory Visit | Attending: Internal Medicine | Admitting: Internal Medicine

## 2014-05-19 NOTE — Progress Notes (Signed)
Today, Makaio exercised at [facility]. Service time was from 1330 to 1500.  The patient exercised for more than 31 minutes performing aerobic, strengthening, and stretching exercises. Oxygen saturation, heart rate, blood pressure, rate of perceived exertion, and shortness of breath were all monitored before, during, and after exercise. Joshau presented with no problems at today's exercise session. Patient attended inhaler use education session.  There was NO workload change during today's exercise session.  Pre-exercise vitals:   Weight kg: 93.1   Liters of O2: RA   SpO2: 97   HR: 72   BP: 104/60   CBG: NA  Exercise vitals:   Highest heartrate:  128   Lowest oxygen saturation: 94   Highest blood pressure: 126/70   Liters of 02: RA  Post-exercise vitals:   SpO2: 97   HR: 86 after elevation of lower ext   BP: 108/70   Liters of O2: RA   CBG: NA Dr. Brand Males, Medical Director Dr. Dyann Kief is immediately available during today's Pulmonary Rehab session for [place] on 05/19/2014 at 1300 class time.

## 2014-05-24 ENCOUNTER — Encounter (HOSPITAL_COMMUNITY)
Admission: RE | Admit: 2014-05-24 | Discharge: 2014-05-24 | Disposition: A | Discharge status: Home / Self Care | Payer: 59 | Source: Ambulatory Visit | Attending: Internal Medicine | Admitting: Internal Medicine

## 2014-05-24 NOTE — Progress Notes (Signed)
Today, Karel exercised at [facility]. Service time was from 1330 to 1500.  The patient exercised for more than 31 minutes performing aerobic, strengthening, and stretching exercises. Oxygen saturation, heart rate, blood pressure, rate of perceived exertion, and shortness of breath were all monitored before, during, and after exercise. Ocean presented with no problems at today's exercise session.   There was NO workload change during today's exercise session.  Pre-exercise vitals:   Weight kg: 93.5   Liters of O2: RA   SpO2: 98   HR: 87   BP: 104/62   CBG: NA  Exercise vitals:   Highest heartrate:  139   Lowest oxygen saturation: 95   Highest blood pressure: 122/78   Liters of 02: RA  Post-exercise vitals:   SpO2: 96   HR: 110 down to 98 with elevation of feet   BP: 112/66   Liters of O2: RA   CBG: NA  Dr. Brand Males, Medical Director Dr. Dyann Kief is immediately available during today's Pulmonary Rehab session for [place] on 05/24/2014 at 1330 class time.

## 2014-05-24 NOTE — Progress Notes (Signed)
Gearld Kerstein 70 years Nutrition Note  Spoke with pt. Pt was again educated on ways he can eat healthier while eating out, such as trying to eat more salads in place of meals every now and then instead of hot dogs and trying to eat more healthy during the week and then eat what he wants on the weekends. Pt was educated that fast food and restaurant foods usually contain high amounts of saturated fat and sodium. Pt was again encouraged to limit his sodium intake. Pt was given a handout "American Heart Association List of approved foods". Pt expressed understanding of the information reviewed.   Nutrition Diagnosis  Food-and nutrition-related knowledge deficit related to lack of exposure to information as related to diagnosis of pulmonary disease  Nutrition Rx/Est. Daily Nutrition Needs for: ? wt loss 2100-2400 Kcal 100-115 gm protein 2000 mg or less sodium   Nutrition Intervention  Pt's individual nutrition plan and goals reviewed with pt.  Pt given educational handout for: AHA list of approved foods Continual client-centered nutrition education by RD, as part of interdisciplinary care.  Goal(s)  1. Describe the benefit of including fruits, vegetables, whole grains, and low-fat dairy products in a healthy meal plan.  Monitor and Evaluate progress toward nutrition goal with team.   Derek Mound, M.Ed, RD, LDN, CDE 05/24/2014 2:25 PM

## 2014-05-24 NOTE — Progress Notes (Signed)
Stephen [name] completed a Six-Minute Walk Test on 05/24/2014 . Stephen [name] walked 1760 feet with 0 breaks.  The patient's lowest oxygen saturation was 93 , highest heart rate was 144 , and highest blood pressure was 146/84. The patient was on no liters of oxygen with a nasal cannula. Stephen [name] stated that his shortness of breath hindered their walk test.

## 2014-05-26 ENCOUNTER — Telehealth: Payer: Self-pay | Admitting: Internal Medicine

## 2014-05-26 NOTE — Telephone Encounter (Signed)
s.w. pt and advised on SEpt appt change due to provider on pal pt ok and aware

## 2014-05-30 ENCOUNTER — Encounter: Payer: Self-pay | Admitting: Internal Medicine

## 2014-05-30 ENCOUNTER — Ambulatory Visit (INDEPENDENT_AMBULATORY_CARE_PROVIDER_SITE_OTHER): Payer: 59 | Admitting: Internal Medicine

## 2014-05-30 ENCOUNTER — Encounter (HOSPITAL_COMMUNITY): Payer: Self-pay

## 2014-05-30 VITALS — BP 112/70 | HR 79 | Ht 70.0 in | Wt 203.0 lb

## 2014-05-30 DIAGNOSIS — J449 Chronic obstructive pulmonary disease, unspecified: Secondary | ICD-10-CM

## 2014-05-30 LAB — PULMONARY FUNCTION TEST
DL/VA % pred: 81 %
DL/VA: 3.77 ml/min/mmHg/L
DLCO unc % pred: 68 %
DLCO unc: 22.1 ml/min/mmHg
FEF 25-75 POST: 0.88 L/s
FEF 25-75 PRE: 0.7 L/s
FEF2575-%CHANGE-POST: 26 %
FEF2575-%PRED-PRE: 22 %
FEF2575-%Pred-Post: 28 %
FEV1-%Change-Post: 8 %
FEV1-%Pred-Post: 54 %
FEV1-%Pred-Pre: 49 %
FEV1-POST: 2 L
FEV1-PRE: 1.84 L
FEV1FVC-%CHANGE-POST: 8 %
FEV1FVC-%PRED-PRE: 65 %
FEV6-%CHANGE-POST: 3 %
FEV6-%Pred-Post: 77 %
FEV6-%Pred-Pre: 74 %
FEV6-PRE: 3.43 L
FEV6-Post: 3.56 L
FEV6FVC-%Change-Post: 4 %
FEV6FVC-%Pred-Post: 101 %
FEV6FVC-%Pred-Pre: 97 %
FVC-%Change-Post: 0 %
FVC-%Pred-Post: 76 %
FVC-%Pred-Pre: 76 %
FVC-Post: 3.68 L
FVC-Pre: 3.67 L
POST FEV1/FVC RATIO: 54 %
POST FEV6/FVC RATIO: 97 %
PRE FEV1/FVC RATIO: 50 %
Pre FEV6/FVC Ratio: 93 %
RV % pred: 111 %
RV: 2.44 L
TLC % pred: 99 %
TLC: 6.93 L

## 2014-05-30 NOTE — Progress Notes (Signed)
Subjective:    Patient ID: Stephen [name], male    DOB: March 16, 1956, 58 y.o.   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 Unchanged PFTs 2009 -> Feb 2012. Feb 2012 PFTs - fev1 1.97L/57%, Ratio 78%  - clas 2 dyspnea, Rx with spiriva and symbicort  - Low risk nuclear med scan heard 08/17/13 - Dr Debara Pickett   B) Quit smoking 2006   C) Rt lung cancer 2006. Ex--throat cancer 2007. RLL 65mm nodule feb 2012-> resolved May 2012 -> In remission sept 2014: Dr Earlie Server   D) Chronic sinus alllergies - Dr Donneta Romberg, Dr. Redmond Baseman.  - last allergy test fall 2012 - plan to increase shots and positive  - on weekly allergyt shots since 2006 per patient. But only poisitive to roach or silk   E) Recc AECOPD  - Aug 2012 x 2  - Dec 2012  - Early 2015: several Rx by PCP Jerlyn Ly, MD per his hx   F) Osteoporosis per hx   G) EOSINOPHILIA  - 400 in sept 2014   H) Hx of chronic allergy shots - Dr Donneta Romberg. 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 Dr Donneta Romberg discussion April/may 2015: ok to come off allergy shots if patients wants to go on research copd trials    OV 05/30/2014  Chief Complaint  Patient presents with  . Follow-up    With pft results. Pt states breathing is unchanged but is wheezing today. Pt c/o DOE, productive cough with green mucous. Denies CP.    OVerall well. COPD PFT fev 1 2L/54%, DLCO 68% and unchanged. PAst few days increased cough, dyspnea and green mucus but denies he is in AECOPD and does not want Rx for AECOPD. He wants to think about research; not decided yet. Finished rehab and felt better   Past hx: is upset  About dad illness. Feels stressed. Will talk to [place], MD about Rx with anti depressnats   Review of Systems  Constitutional: Negative for fever and unexpected weight change.  HENT: Positive for congestion. Negative for dental problem, ear pain, nosebleeds, postnasal drip, rhinorrhea, sinus pressure, sneezing, sore throat and trouble swallowing.    Eyes: Negative for redness and itching.  Respiratory: Positive for cough and shortness of breath. Negative for chest tightness and wheezing.   Cardiovascular: Negative for palpitations and leg swelling.  Gastrointestinal: Positive for nausea and abdominal pain. Negative for vomiting.  Genitourinary: Negative for dysuria.  Musculoskeletal: Negative for joint swelling.  Skin: Negative for rash.  Neurological: Negative for headaches.  Hematological: Does not bruise/bleed easily.  Psychiatric/Behavioral: Negative for dysphoric mood. The patient is not nervous/anxious.        Objective:   Physical Exam  Filed Vitals:   05/30/14 1344  BP: 112/70  Pulse: 79  Height: 5\' 10"  (1.778 m)  Weight: 203 lb (92.08 kg)  SpO2: 96%   Nursing note and vitals reviewed. Constitutional: He is oriented to person, place, and time. He appears well-developed and well-nourished. No distress.  HENT:  Head: Normocephalic and atraumatic.  Right Ear: External ear normal.  Left Ear: External ear normal.  Mouth/Throat: Oropharynx is clear and moist. No oropharyngeal exudate.  Eyes: Conjunctivae and EOM are normal. Pupils are equal, round, and reactive to light. Right eye exhibits no discharge. Left eye exhibits no discharge. No scleral icterus.  Neck: Normal range of motion. Neck supple. No JVD present. No tracheal deviation present. No thyromegaly present.  Cardiovascular: Normal rate,  regular rhythm and intact distal pulses.  Exam reveals no gallop and no friction rub.   No murmur heard. Pulmonary/Chest: Effort normal. No respiratory distress. He has wheezes. He has no rales. He exhibits no tenderness.  bialteral wheeze; appears on several prior exams and might be chronic issue Hoarse voice  Abdominal: Soft. Bowel sounds are normal. He exhibits no distension and no mass. There is no tenderness. There is no rebound and no guarding.  Musculoskeletal: Normal range of motion. He exhibits no edema and no  tenderness.  Lymphadenopathy:    He has no cervical adenopathy.  Neurological: He is alert and oriented to person, place, and time. He has normal reflexes. No cranial nerve deficit. Coordination normal.  Skin: Skin is warm and dry. No rash noted. He is not diaphoretic. No erythema. No pallor.  Psychiatric: He has a normal mood and affect. His behavior is normal. Judgment and thought content normal.          Assessment & Plan:  #COPD  - stable disease  - continue spiriva and symbicort scheduld  - due to high eosinophils you are at risk for future copd flare ups; no specific treatment against these eosinophils as of now except research trial participation - continue above treatment  #Possible depression  - discuss with Dr Sherrye Payor  Followup  - 3 months with copd cat score

## 2014-05-30 NOTE — Patient Instructions (Addendum)
#  COPD  - stable disease  - continue spiriva and symbicort scheduld  - due to high eosinophils you are at risk for future copd flare ups; no specific treatment against these eosinophils as of now except research trial participation - continue above treatment  #Possible depression  - discuss with Dr Sherrye Payor  Followup  - 3 months with copd cat score

## 2014-05-30 NOTE — Outcomes Assessment (Signed)
The . [facility] Pulmonary Rehabilitation Final/Discharge Outcome Results  Anthropometrics:   Height (inches): 70   Weight (kg): 93.5   This is a change of +4.2 from entrance.   Grip strength was measured using a Dynamometer.  The patient's discharge score was a 62.     This is a change of +20 from entrance.  Functional Status/Exercise Capacity:   Faraaz had a resting heart rate of 87 BPM, a resting blood pressure of 104/62, and an oxygen saturation of 98 % on 0 liters of O2.  Adewale performed a discharge 6-minute walk test on 05/24/14.  The patient completed 1,760 feet in 6 minutes with 0 rest breaks.  This quantifies 3.53 METS. The patient's goal is to add 82 feet onto the baseline 6MWT.     The patient increased their 6-minute walk test distance by +475 feet and their MET level by +0.69 METs.  Dyspnea Measures:   The [facility] is a simple and standardized method of classifying disability in patients with COPD.  The assessment correlates disability and dyspnea.  Upon discharge the patients resting score was 1. The scale is provided below.    This is a change of 0 from entrance.  0= I only get breathless with strenuous exercise. 1= I get short of breath when hurrying on level ground or walking up a slight incline. 2= On level ground, I walk slower than people of the same age because of breathlessness, or have to stop for breath when walking at my own pace. 3= I stop for breath after walking 100 yards or after a few minutes on level ground. 4=I am too breathless to leave the house or I am breathless when dressing.     The patient completed the [place] ([facility] [place]).  This questionnaire relates activities of daily living and shortness of breath.  The score ranges from 0-120, a higher score relates to severe shortness of breath during activities of daily living. The patient's score at discharge was 32.   This is a change  of -48 from entrance.  Quality of Life:   Ferrans and Powers Quality of Life Index Pulmonary Version is used to assess the patients satisfaction in different domains of their life; health and functioning, socioeconomic, psychological/spiritual, and family. The overall score is recorded out of 30 points.  The patient's goal is to achieve an overall score of 21 or higher.  Bartlomiej received a 11.46 upon discharge.    This is a change of +2.29 from entrance.  Clinical Assessment Tools:   The COPD Assessment Test (CAT) is a measurement tool to quantify how much of an impact the disease has on the patient's life.  This assessment aids the Pulmonary Rehab Team in designing the patients individualized treatment plan.  A CAT score ranges from 0-40.  A score of 10 or below indicates that COPD has a low impact on the patient's life whereas a score of 30 or higher indicates a severe impact. The patient's goal is a decrease of 1 point from entrance to discharge.  Zahari had a CAT score of 14 upon discharge.     This is a change of -12 from entrance.  Nutrition:   The "Rate My Plate" is a dietary assessment that quantifies the balance of a patient's diet.  This tool allows the Pulmonary Rehab Team to key in on the areas of the patient's diet that needs improving.  The team can then  focus their nutritional education on those areas.  If the patient scores 24-40, this means there are many ways they can make their eating habits healthier, 41-57 states that there are some ways they can make their eating habits healthier and a score of 58-72 states that they are making many healthy choices.  The patient's goal is to achieve a score of 49 or higher on this assessment.  Timoth scored a 67 upon discharge.     This is a change of +1 from entrance.  Oxygen Compliance: Patient is currently on 0 liters at rest, 0 liters at night, and 0 liters for exercise.  Cross is not currently using cpap/bipap at night.     This is a no  change from entrance.    Education:   Arcangel attended more than 75% of the education classes.  Trever completed a discharge educational assessment and achieved a score of 13/14.    This is a change of +1 from entrance.   Smoking Cessation:  The patient is not currently smoking.  Exercise:   Travaris was provided with an individualized Home Exercise Prescription (HEP) at the entrance of the program.  The patient's goal is to be exercising 30-60 minutes, 3-5 days per week. Upon discharge the patient is exercising 0 days at home.  This is a change of 0 from entrance.  After graduation from Pulmonary Rehab, Emileo will continue exercising at home.

## 2014-05-30 NOTE — Progress Notes (Signed)
PFT done today. 

## 2014-06-10 NOTE — Assessment & Plan Note (Signed)
#  COPD  - stable disease  - continue spiriva and symbicort scheduld  - due to high eosinophils you are at risk for future copd flare ups; no specific treatment against these eosinophils as of now except research trial participation - continue above treatment  #Possible depression  - discuss with Dr Sherrye Payor  Followup  - 3 months with copd cat score

## 2014-06-16 ENCOUNTER — Telehealth: Payer: Self-pay | Admitting: Internal Medicine

## 2014-06-16 NOTE — Telephone Encounter (Signed)
Please tell him. If needed you can read out   - I had d/w Dr Donneta Romberg his allergist:  From a clinical standpoing: Dr Donneta Romberg was not sure if working or not. Certainly patient felt it was working so Dr Donneta Romberg and I respect that. . Dr Donneta Romberg was ok with him stopping to see if it made a difference from his clinical stand point and see how things evolved  - From a research stand point: if he is interested in research then he has to come off allergy shots. It is his call. Research is voluntary so I cannot force him to stop a Rx.   Let me know  Thanks  Dr. Brand Males, M.D., [facility].C.P Pulmonary and Critical Care Medicine Staff Physician [place] [facility] and Critical [facility] Pager: [phone], If no answer or between  15:00h - 7:00h: call [phone]  06/16/2014 11:38 AM

## 2014-06-16 NOTE — Telephone Encounter (Signed)
Called and spoke to pt. Pt stated he is wondering if he should continue with his allergy injections at Dr. Rush Landmark office. Pt stated he discussed stopping the injections at his last OV with MR. Pt states he still thinks they are working but wants to know if MR wants him to stop for the research trial. Pt took last injection on 6/17/20215 and is taking the injections every Wednesday for 10 years. MR if you could advise if to hold or continue with the allergy injections. Thanks.

## 2014-06-17 NOTE — Telephone Encounter (Signed)
Stephen [name]  Please call Stephen Ly, MD office and get history (either on phone or hve them send notes) of his OV since June 2014 for which he has received abx or prednisone for AE-COPD  Thanks Dr. Brand Males, M.D., [facility].C.P Pulmonary and Critical Care Medicine Staff Physician [place] [facility]monary and Critical Care Pager: [phone], If no answer or between  15:00h - 7:00h: call [phone]  06/17/2014 6:11 PM

## 2014-06-17 NOTE — Telephone Encounter (Signed)
I called and made pt aware of MR recs.  Pt reports he still has some serum left and didn't want the nurse to re-order the serum.  He has 1-2 more doses left and then he wants to start the research. He will call us once he is out. Will make MR aware

## 2014-06-21 NOTE — Telephone Encounter (Signed)
Called for records. Office closes at [place]

## 2014-06-22 NOTE — Telephone Encounter (Signed)
Last visit he had at [place] was in [place], and not due until 7/9. Rodena Piety [phone] ext [phone]

## 2014-06-22 NOTE — Telephone Encounter (Signed)
Called Rodena Piety to have records faxed. Rodena Piety stated she would go ahead and fax the only OV the pt has had this year. Will await fax and place in MR's look at.

## 2014-06-22 NOTE — Telephone Encounter (Signed)
LM with Rodena Piety to fax notes to triage fax  Will send to [facility] as [facility]

## 2014-06-23 NOTE — Telephone Encounter (Signed)
Received fax from [facility]. Will place in MR's look at to review. Will forward to MR to make aware.

## 2014-06-28 NOTE — Telephone Encounter (Signed)
Can you call his pharmacy please and find out abx/pred prescriptions past 12-13 months. Thanks, MR

## 2014-06-28 NOTE — Telephone Encounter (Signed)
Thanks. Lot of AECOPD.   Dr. Brand Males, M.D., [facility].C.P Pulmonary and Critical Care Medicine Staff Physician [place] [facility]monary and Critical Care Pager: [phone], If no answer or between  15:00h - 7:00h: call [phone]  06/28/2014 11:12 PM

## 2014-06-28 NOTE — Telephone Encounter (Signed)
From [place] on [address], [place]:  Pred Rx Tapers: 01/07/14 pred taper from Dr. Joylene Draft 01/17/14 pred taper from Dr. Osborne Casco 02/03/14 pred taper from Dr. Joylene Draft 02/14/14 pred taper from Dr. Joylene Draft 02/25/14 pred taper from Dr. Chase Caller 03/07/14 pred taper from Dr. Joylene Draft  Abx Rx: 06/18/13 Augmentin from Dr. Joylene Draft 08/09/13 Amoxicillin from Dr. Conley Canal 12/14/13 Zpak (3tab) from Dr. Joylene Draft 01/07/14 levaquin 500mg  from Dr. Joylene Draft 01/21/14 levaquin 500mg  from Brigitte Pulse 02/03/14 Zpak (3tab) from Dr. Joylene Draft 03/07/14 Doxycycline from Dr. Hyman Hopes stated they did not have any Rx for pred before 01/07/14.

## 2014-07-08 ENCOUNTER — Telehealth: Payer: Self-pay | Admitting: Internal Medicine

## 2014-07-08 NOTE — Telephone Encounter (Signed)
Called and spoke with pt and he stated that MR told him his blood work should be back by today and pt wanted to know if he has been accepted into the research study.  MR please advise. Thanks  Allergies  Allergen Reactions  . Codeine Other (See Comments)    unknown  . Cymbalta [Duloxetine Hcl] Nausea And Vomiting  . Montelukast Sodium Other (See Comments)    Causes sinusitis    Current Outpatient Prescriptions on File Prior to Visit  Medication Sig Dispense Refill  . albuterol (PROVENTIL HFA;VENTOLIN HFA) 108 (90 BASE) MCG/ACT inhaler Inhale 2 puffs into the lungs every 6 (six) hours as needed.        Marland Kitchen albuterol (PROVENTIL) (2.5 MG/3ML) 0.083% nebulizer solution Take 2.5 mg by nebulization every 6 (six) hours as needed.        Marland Kitchen alendronate (FOSAMAX) 70 MG tablet Take 70 mg by mouth every 7 (seven) days. Take with a full glass of water on an empty stomach. On Mondays      . aspirin 81 MG tablet Take 81 mg by mouth daily.        . benzonatate (TESSALON) 100 MG capsule Take 100 mg by mouth 2 (two) times daily.      . budesonide-formoterol (SYMBICORT) 160-4.5 MCG/ACT inhaler Inhale 2 puffs into the lungs 2 (two) times daily. Sample x 3 days       . fluticasone (FLONASE) 50 MCG/ACT nasal spray Place 2 sprays into both nostrils daily.  16 g  2  . levothyroxine (SYNTHROID, LEVOTHROID) 125 MCG tablet Take 67.5 mcg by mouth daily before breakfast.       . nitroGLYCERIN (NITROSTAT) 0.4 MG SL tablet Place 1 tablet (0.4 mg total) under the tongue every 5 (five) minutes as needed for chest pain.  20 tablet  0  . omeprazole (PRILOSEC) 20 MG capsule Take 1 capsule (20 mg total) by mouth daily.  30 capsule  11  . pravastatin (PRAVACHOL) 40 MG tablet Take 40 mg by mouth every evening.       . temazepam (RESTORIL) 30 MG capsule Take 30 mg by mouth at bedtime as needed for sleep.      Marland Kitchen tiotropium (SPIRIVA) 18 MCG inhalation capsule Place 18 mcg into inhaler and inhale daily.        . Vitamin D,  Ergocalciferol, (DRISDOL) 50000 UNITS CAPS Take 1 tablet by mouth Once every 2 weeks.       No current facility-administered medications on file prior to visit.

## 2014-07-09 NOTE — Telephone Encounter (Signed)
His eosinophils were low so he did not make it to first [place] study. We will either resecreen him or look at him for another study called IMPACT; you can just read the above as is and let him know that research coordinator will call him next few days  Dr. Brand Males, M.D., [facility].C.P Pulmonary and Critical Care Medicine Staff Physician [place] Pulmonary and Critical Care Pager: [phone], If no answer or between  15:00h - 7:00h: call [phone]  07/09/2014 4:50 PM

## 2014-07-11 NOTE — Telephone Encounter (Signed)
I called made pt aware of below. Nothing further needed

## 2014-08-25 ENCOUNTER — Encounter: Payer: Self-pay | Admitting: Gastroenterology

## 2014-09-08 ENCOUNTER — Encounter: Payer: Self-pay | Admitting: Internal Medicine

## 2014-09-08 ENCOUNTER — Ambulatory Visit (INDEPENDENT_AMBULATORY_CARE_PROVIDER_SITE_OTHER): Payer: 59 | Admitting: Internal Medicine

## 2014-09-08 VITALS — BP 146/88 | HR 86 | Ht 70.0 in | Wt 206.0 lb

## 2014-09-08 DIAGNOSIS — Z23 Encounter for immunization: Secondary | ICD-10-CM

## 2014-09-08 DIAGNOSIS — J449 Chronic obstructive pulmonary disease, unspecified: Secondary | ICD-10-CM

## 2014-09-08 NOTE — Progress Notes (Signed)
Subjective:    Patient ID: Stephen [name], male    DOB: 11-Aug-1956, 58 y.o.   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 Unchanged PFTs 2009 -> Feb 2012. Feb 2012 PFTs - fev1 1.97L/57%, Ratio 78%  - clas 2 dyspnea, Rx with spiriva and symbicort  - Low risk nuclear med scan heard 08/17/13 - Dr Debara Pickett   B) Quit smoking 2006   C) Rt lung cancer 2006. Ex--throat cancer 2007. RLL 98mm nodule feb 2012-> resolved May 2012 -> In remission sept 2014: Dr Earlie Server   D) Chronic sinus alllergies - Dr Donneta Romberg, Dr. Redmond Baseman.  - last allergy test fall 2012 - plan to increase shots and positive  - on weekly allergyt shots since 2006 per patient. But only poisitive to roach or silk   E) Recc AECOPD  - Aug 2012 x 2  - Dec 2012  - Early 2015: several Rx by PCP Jerlyn Ly, MD per his hx   F) Osteoporosis per hx   G) EOSINOPHILIA  - 400 in sept 2014   H) Hx of chronic allergy shots - Dr Donneta Romberg. 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 Dr Donneta Romberg discussion April/may 2015: ok to come off allergy shots if patients wants to go on research copd trials    OV 05/30/2014  Chief Complaint  Patient presents with  . Follow-up    With pft results. Pt states breathing is unchanged but is wheezing today. Pt c/o DOE, productive cough with green mucous. Denies CP.    OVerall well. COPD PFT fev 1 2L/54%, DLCO 68% and unchanged. PAst few days increased cough, dyspnea and green mucus but denies he is in AECOPD and does not want Rx for AECOPD. He wants to think about research; not decided yet. Finished rehab and felt better    OV 09/08/2014  Chief Complaint  Patient presents with  . Follow-up    Pt states Dr. Joylene Draft gave abx and pred for bronchitis. Pt states he is feeling much better. Pt states he is still SOB but no more than normal. Pt taking mucinex and it is helping.     Followup moderate COPD. Continue on Spiriva and Symbicort. 3 weeks ago had COPD exacerbation and was treated with  Augmentin and prednisone by his primary care physician. He has been off prednisone for 2 weeks. His known baseline health. There are no new issues. He wants him samples of his inhalers. Of note, he has not had Prevnar vaccine so we will give it to him today. He is extremely keen and participation in COPD research trials and is frustrated that he did not qualify for [employer] injectable study against eosinophils. Allergies hopeful that he might be qualify for this trial if he was rescreened   Past medical history - Due to have colonoscopy shortly which I have told him that he "provided he was not wheezing significantly  - Lung cancer surveillance: He has an upcoming CT scan of the chest with Dr. Earlie Server   Immunization History  Administered Date(s) Administered  . Influenza Split 08/30/2013, 09/08/2014  . Influenza Whole 10/01/2010, 10/02/2011  . Pneumococcal Polysaccharide-23 10/30/2009     Review of Systems  Constitutional: Negative for fever and unexpected weight change.  HENT: Negative for congestion, dental problem, ear pain, nosebleeds, postnasal drip, rhinorrhea, sinus pressure, sneezing, sore throat and trouble swallowing.   Eyes: Negative for redness and itching.  Respiratory: Positive for shortness of breath. Negative for cough,  chest tightness and wheezing.   Cardiovascular: Negative for palpitations and leg swelling.  Gastrointestinal: Negative for nausea and vomiting.  Genitourinary: Negative for dysuria.  Musculoskeletal: Negative for joint swelling.  Skin: Negative for rash.  Neurological: Negative for headaches.  Hematological: Does not bruise/bleed easily.  Psychiatric/Behavioral: Negative for dysphoric mood. The patient is not nervous/anxious.    Current outpatient prescriptions:albuterol (PROVENTIL HFA;VENTOLIN HFA) 108 (90 BASE) MCG/ACT inhaler, Inhale 2 puffs into the lungs every 6 (six) hours as needed.  , Disp: , Rfl: ;  albuterol (PROVENTIL) (2.5 MG/3ML) 0.083%  nebulizer solution, Take 2.5 mg by nebulization every 6 (six) hours as needed.  , Disp: , Rfl:  alendronate (FOSAMAX) 70 MG tablet, Take 70 mg by mouth every 7 (seven) days. Take with a full glass of water on an empty stomach. On Mondays, Disp: , Rfl: ;  aspirin 81 MG tablet, Take 81 mg by mouth daily.  , Disp: , Rfl: ;  budesonide-formoterol (SYMBICORT) 160-4.5 MCG/ACT inhaler, Inhale 2 puffs into the lungs 2 (two) times daily. Sample x 3 days , Disp: , Rfl:  fluticasone (FLONASE) 50 MCG/ACT nasal spray, Place 2 sprays into both nostrils daily., Disp: 16 g, Rfl: 2;  levothyroxine (SYNTHROID, LEVOTHROID) 125 MCG tablet, Take 67.5 mcg by mouth daily before breakfast. , Disp: , Rfl: ;  nitroGLYCERIN (NITROSTAT) 0.4 MG SL tablet, Place 1 tablet (0.4 mg total) under the tongue every 5 (five) minutes as needed for chest pain., Disp: 20 tablet, Rfl: 0 pravastatin (PRAVACHOL) 40 MG tablet, Take 40 mg by mouth every evening. , Disp: , Rfl: ;  temazepam (RESTORIL) 30 MG capsule, Take 30 mg by mouth at bedtime as needed for sleep., Disp: , Rfl: ;  tiotropium (SPIRIVA) 18 MCG inhalation capsule, Place 18 mcg into inhaler and inhale daily.  , Disp: , Rfl: ;  Vitamin D, Ergocalciferol, (DRISDOL) 50000 UNITS CAPS, Take 1 tablet by mouth Once every 2 weeks., Disp: , Rfl:  benzonatate (TESSALON) 100 MG capsule, Take 100 mg by mouth 2 (two) times daily., Disp: , Rfl: ;  omeprazole (PRILOSEC) 20 MG capsule, Take 1 capsule (20 mg total) by mouth daily., Disp: 30 capsule, Rfl: 11      Objective:   Physical Exam Filed Vitals:   09/08/14 1531  BP: 146/88  Pulse: 86  Height: 5\' 10"  (1.778 m)  Weight: 206 lb (93.441 kg)  SpO2: 96%    Body mass index is 29.56 kg/(m^2).   d vitals reviewed. Constitutional: He is oriented to person, place, and time. He appears well-developed and well-nourished. No distress.  HENT:  Head: Normocephalic and atraumatic.  Right Ear: External ear normal.  Left Ear: External ear normal.    Mouth/Throat: Oropharynx is clear and moist. No oropharyngeal exudate.  Eyes: Conjunctivae and EOM are normal. Pupils are equal, round, and reactive to light. Right eye exhibits no discharge. Left eye exhibits no discharge. No scleral icterus.  Neck: Normal range of motion. Neck supple. No JVD present. No tracheal deviation present. No thyromegaly present.  Cardiovascular: Normal rate, regular rhythm and intact distal pulses.  Exam reveals no gallop and no friction rub.   No murmur heard. Pulmonary/Chest: Effort normal. No respiratory distress. He has wheezes. He has no rales. He exhibits no tenderness.  bialteral wheeze; appears on several prior exams and might be chronic issue Hoarse voice  Abdominal: Soft. Bowel sounds are normal. He exhibits no distension and no mass. There is no tenderness. There is no rebound and no guarding.  Musculoskeletal: Normal range of motion. He exhibits no edema and no tenderness.  Lymphadenopathy:    He has no cervical adenopathy.  Neurological: He is alert and oriented to person, place, and time. He has normal reflexes. No cranial nerve deficit. Coordination normal.  Skin: Skin is warm and dry. No rash noted. He is not diaphoretic. No erythema. No pallor.  Psychiatric: He has a normal mood and affect. His behavior is normal. Judgment and thought content normal.      Assessment & Plan:  #COPD  - stable disease  - continue spiriva and symbicort scheduld - glad you are better from recent exacerbation - we will try to rescreen you for [employer] research study:  due to high eosinophils you are at risk for future copd flare ups - continue to hold off allergy shots  - have PREVNAR 09/08/2014 vaccine    Followup  - 3 months with copd cat score

## 2014-09-08 NOTE — Patient Instructions (Signed)
#  COPD  - stable disease  - continue spiriva and symbicort scheduld - glad you are better from recent exacerbation - we will try to rescreen you for [employer] research study:  due to high eosinophils you are at risk for future copd flare ups - continue to hold off allergy shots  - have PREVNAR 09/08/2014 vaccine    Followup  - 3 months with copd cat score

## 2014-09-14 ENCOUNTER — Encounter (HOSPITAL_COMMUNITY): Payer: Self-pay

## 2014-09-14 ENCOUNTER — Ambulatory Visit (HOSPITAL_COMMUNITY)
Admission: RE | Admit: 2014-09-14 | Discharge: 2014-09-14 | Disposition: A | Discharge status: Home / Self Care | Payer: 59 | Source: Ambulatory Visit | Attending: Internal Medicine | Admitting: Internal Medicine

## 2014-09-14 ENCOUNTER — Other Ambulatory Visit (HOSPITAL_BASED_OUTPATIENT_CLINIC_OR_DEPARTMENT_OTHER): Payer: 59

## 2014-09-14 DIAGNOSIS — J438 Other emphysema: Secondary | ICD-10-CM | POA: Diagnosis not present

## 2014-09-14 DIAGNOSIS — K8020 Calculus of gallbladder without cholecystitis without obstruction: Secondary | ICD-10-CM | POA: Insufficient documentation

## 2014-09-14 DIAGNOSIS — Z902 Acquired absence of lung [part of]: Secondary | ICD-10-CM | POA: Diagnosis not present

## 2014-09-14 DIAGNOSIS — Z923 Personal history of irradiation: Secondary | ICD-10-CM | POA: Insufficient documentation

## 2014-09-14 DIAGNOSIS — Z9221 Personal history of antineoplastic chemotherapy: Secondary | ICD-10-CM | POA: Insufficient documentation

## 2014-09-14 DIAGNOSIS — C3490 Malignant neoplasm of unspecified part of unspecified bronchus or lung: Secondary | ICD-10-CM

## 2014-09-14 DIAGNOSIS — R0602 Shortness of breath: Secondary | ICD-10-CM | POA: Diagnosis not present

## 2014-09-14 LAB — CBC WITH DIFFERENTIAL/PLATELET
BASO%: 1.3 % (ref 0.0–2.0)
BASOS ABS: 0.1 10*3/uL (ref 0.0–0.1)
EOS%: 4.1 % (ref 0.0–7.0)
Eosinophils Absolute: 0.3 10*3/uL (ref 0.0–0.5)
HCT: 44 % (ref 38.4–49.9)
HEMOGLOBIN: 15 g/dL (ref 13.0–17.1)
LYMPH#: 1.2 10*3/uL (ref 0.9–3.3)
LYMPH%: 18.7 % (ref 14.0–49.0)
MCH: 29.3 pg (ref 27.2–33.4)
MCHC: 34.1 g/dL (ref 32.0–36.0)
MCV: 85.9 fL (ref 79.3–98.0)
MONO#: 0.7 10*3/uL (ref 0.1–0.9)
MONO%: 10.4 % (ref 0.0–14.0)
NEUT#: 4.2 10*3/uL (ref 1.5–6.5)
NEUT%: 65.5 % (ref 39.0–75.0)
Platelets: 148 10*3/uL (ref 140–400)
RBC: 5.12 10*6/uL (ref 4.20–5.82)
RDW: 13 % (ref 11.0–14.6)
WBC: 6.4 10*3/uL (ref 4.0–10.3)

## 2014-09-14 LAB — COMPREHENSIVE METABOLIC PANEL (CC13)
ALBUMIN: 4.2 g/dL (ref 3.5–5.0)
ALT: 26 U/L (ref 0–55)
ANION GAP: 11 meq/L (ref 3–11)
AST: 24 U/L (ref 5–34)
Alkaline Phosphatase: 103 U/L (ref 40–150)
BUN: 11.8 mg/dL (ref 7.0–26.0)
CALCIUM: 9.9 mg/dL (ref 8.4–10.4)
CHLORIDE: 107 meq/L (ref 98–109)
CO2: 22 meq/L (ref 22–29)
CREATININE: 1 mg/dL (ref 0.7–1.3)
Glucose: 107 mg/dl (ref 70–140)
POTASSIUM: 3.7 meq/L (ref 3.5–5.1)
Sodium: 141 mEq/L (ref 136–145)
Total Bilirubin: 0.66 mg/dL (ref 0.20–1.20)
Total Protein: 7.8 g/dL (ref 6.4–8.3)

## 2014-09-14 MED ORDER — IOHEXOL 300 MG/ML  SOLN
80.0000 mL | Freq: Once | INTRAMUSCULAR | Status: AC | PRN
  Administered 2014-09-14: 80 mL via INTRAVENOUS

## 2014-09-15 ENCOUNTER — Ambulatory Visit: Payer: 59 | Admitting: Internal Medicine

## 2014-09-15 DIAGNOSIS — Z23 Encounter for immunization: Secondary | ICD-10-CM | POA: Insufficient documentation

## 2014-09-19 ENCOUNTER — Ambulatory Visit (HOSPITAL_BASED_OUTPATIENT_CLINIC_OR_DEPARTMENT_OTHER): Payer: 59 | Admitting: Internal Medicine

## 2014-09-19 ENCOUNTER — Encounter: Payer: Self-pay | Admitting: Internal Medicine

## 2014-09-19 ENCOUNTER — Telehealth: Payer: Self-pay | Admitting: Internal Medicine

## 2014-09-19 VITALS — BP 119/90 | HR 74 | Temp 98.0°F | Resp 18 | Ht 70.0 in | Wt 204.5 lb

## 2014-09-19 DIAGNOSIS — C3490 Malignant neoplasm of unspecified part of unspecified bronchus or lung: Secondary | ICD-10-CM

## 2014-09-19 DIAGNOSIS — Z85118 Personal history of other malignant neoplasm of bronchus and lung: Secondary | ICD-10-CM

## 2014-09-19 DIAGNOSIS — Z85819 Personal history of malignant neoplasm of unspecified site of lip, oral cavity, and pharynx: Secondary | ICD-10-CM

## 2014-09-19 NOTE — Telephone Encounter (Signed)
gv adn printed appt sched and avs for pt fro Sept 2016

## 2014-09-19 NOTE — Progress Notes (Signed)
[facility] [phone]   [phone]) [phone]  OFFICE PROGRESS NOTE  Stephen Ly, Stephen [name] [place] [address]  PRINCIPAL DIAGNOSES:  1. Recurrent non-small cell lung cancer, presented with endobronchial lesion involving the right main stem bronchus diagnosed in March 2007.  2. History of stage IB non-small cell lung cancer diagnosed in May 2006.  3. 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  PRIOR THERAPY:  1. Status post right upper lobectomy on May 01, 2005 under the care of Dr. Arlyce Dice.  2. Status post laryngoscopy with CO2 laser excision of the right vocal cord lesion under the care of Dr. Redmond Baseman in March 2007.  3. Status post concurrent chemoradiation with weekly carboplatin and paclitaxel. Last dose was given May 12, 2006.  4. Status post 3 cycles of consolidation chemotherapy with docetaxel. Last dose was given August 21, 2006.  CURRENT THERAPY: Observation.  INTERVAL HISTORY: Stephen [name] 58 y.o. male returns to the clinic today for routine annual follow up visit. The patient is feeling fine today with no specific complaints except for one episode of acid reflux last night. He denied having any significant chest pain, shortness breath, cough or hemoptysis. The patient denied having any significant weight loss or night sweats. He has no nausea or vomiting. He had repeat CT scan of the chest performed recently and he is here for evaluation and discussion of his scan results.  MEDICAL HISTORY: Past Medical History  Diagnosis Date  . Allergic rhinitis, cause unspecified   . Other emphysema     copd  . Unspecified vitamin D deficiency   . Dyspnea   . Osteoporosis   . Thrombosed hemorrhoids   . Blindness of left eye   . Hypothyroidism   . Lung cancer     lung ca dx 06  . Throat cancer     throat ca dx 2007    ALLERGIES:  is allergic to codeine; cymbalta; and montelukast  sodium.  MEDICATIONS:  Current Outpatient Prescriptions  Medication Sig Dispense Refill  . albuterol (PROVENTIL HFA;VENTOLIN HFA) 108 (90 BASE) MCG/ACT inhaler Inhale 2 puffs into the lungs every 6 (six) hours as needed.        Marland Kitchen albuterol (PROVENTIL) (2.5 MG/3ML) 0.083% nebulizer solution Take 2.5 mg by nebulization every 6 (six) hours as needed.        Marland Kitchen alendronate (FOSAMAX) 70 MG tablet Take 70 mg by mouth every 7 (seven) days. Take with a full glass of water on an empty stomach. On Mondays      . aspirin 81 MG tablet Take 81 mg by mouth daily.        . benzonatate (TESSALON) 100 MG capsule Take 100 mg by mouth 2 (two) times daily.      . budesonide-formoterol (SYMBICORT) 160-4.5 MCG/ACT inhaler Inhale 2 puffs into the lungs 2 (two) times daily. Sample x 3 days       . fluticasone (FLONASE) 50 MCG/ACT nasal spray Place 2 sprays into both nostrils daily.  16 g  2  . levothyroxine (SYNTHROID, LEVOTHROID) 125 MCG tablet Take 67.5 mcg by mouth daily before breakfast.       . omeprazole (PRILOSEC) 20 MG capsule Take 1 capsule (20 mg total) by mouth daily.  30 capsule  11  . pravastatin (PRAVACHOL) 40 MG tablet Take 40 mg by mouth every evening.       . temazepam (RESTORIL) 30 MG  capsule Take 30 mg by mouth at bedtime as needed for sleep.      Marland Kitchen tiotropium (SPIRIVA) 18 MCG inhalation capsule Place 18 mcg into inhaler and inhale daily.        . Vitamin D, Ergocalciferol, (DRISDOL) 50000 UNITS CAPS Take 1 tablet by mouth Once every 2 weeks.      . nitroGLYCERIN (NITROSTAT) 0.4 MG SL tablet Place 1 tablet (0.4 mg total) under the tongue every 5 (five) minutes as needed for chest pain.  20 tablet  0   No current facility-administered medications for this visit.    SURGICAL HISTORY:  Past Surgical History  Procedure Laterality Date  . Appendectomy    . Lung lobectomy      RUL  . Rotator cuff repair    . Knee surgery    . Shoulder surgery    . Throat surgery      Laser surgery for throat  cancer  . Transthoracic echocardiogram  01/13/2012    EF=>55%; trace MR/TR;     REVIEW OF SYSTEMS:  A comprehensive review of systems was negative.   PHYSICAL EXAMINATION: General appearance: alert, cooperative and no distress Head: Normocephalic, without obvious abnormality, atraumatic Neck: no adenopathy, no JVD and supple, symmetrical, trachea midline Lymph nodes: Cervical, supraclavicular, and axillary nodes normal. Resp: clear to auscultation bilaterally Cardio: regular rate and rhythm, S1, S2 normal, no murmur, click, rub or gallop GI: soft, non-tender; bowel sounds normal; no masses,  no organomegaly Extremities: extremities normal, atraumatic, no cyanosis or edema  ECOG PERFORMANCE STATUS: 0 - Asymptomatic  Blood pressure 119/90, pulse 74, temperature 98 F (36.7 C), temperature source Oral, resp. rate 18, height 5\' 10"  (1.778 m), weight 204 lb 8 oz (92.761 kg), SpO2 98.00%.  LABORATORY DATA: Lab Results  Component Value Date   WBC 6.4 09/14/2014   HGB 15.0 09/14/2014   HCT 44.0 09/14/2014   MCV 85.9 09/14/2014   PLT 148 09/14/2014      Chemistry      Component Value Date/Time   NA 141 09/14/2014 0912   NA 141 08/08/2013 2123   NA 142 03/11/2012 1125   K 3.7 09/14/2014 0912   K 3.6 08/08/2013 2123   K 3.8 03/11/2012 1125   CL 104 08/08/2013 2123   CL 106 09/14/2012 0808   CL 98 03/11/2012 1125   CO2 22 09/14/2014 0912   CO2 26 08/08/2013 2123   CO2 30 03/11/2012 1125   BUN 11.8 09/14/2014 0912   BUN 17 08/08/2013 2123   BUN 18 03/11/2012 1125   CREATININE 1.0 09/14/2014 0912   CREATININE 0.80 08/08/2013 2123   CREATININE 1.0 03/11/2012 1125      Component Value Date/Time   CALCIUM 9.9 09/14/2014 0912   CALCIUM 9.3 08/08/2013 2123   CALCIUM 9.0 03/11/2012 1125   ALKPHOS 103 09/14/2014 0912   ALKPHOS 95 08/08/2013 2123   ALKPHOS 98* 03/11/2012 1125   AST 24 09/14/2014 0912   AST 29 08/08/2013 2123   AST 26 03/11/2012 1125   ALT 26 09/14/2014 0912   ALT 25 08/08/2013 2123   ALT 35  03/11/2012 1125   BILITOT 0.66 09/14/2014 0912   BILITOT 0.5 08/08/2013 2123   BILITOT 0.80 03/11/2012 1125       RADIOGRAPHIC STUDIES: Ct Chest W Contrast  09/14/2014   CLINICAL DATA:  Right upper lobectomy for lung cancer diagnosed in 2006. Prior chemotherapy and radiation therapy. Throat cancer in 2007. Shortness of breath. COPD.  EXAM: CT CHEST  WITH CONTRAST  TECHNIQUE: Multidetector CT imaging of the chest was performed during intravenous contrast administration.  CONTRAST:  58mL OMNIPAQUE IOHEXOL 300 MG/ML  SOLN  COMPARISON:  Multiple exams, including 02/25/2014 and 09/14/2013  FINDINGS: Right upper paratracheal node 7 mm in short axis, previously 8 mm, image 17 series 2. No pathologic thoracic adenopathy. The right atrium is particularly diminutive in size in the left atrium is somewhat small as well.  8 mm gallstone noted in the gallbladder. Atrophic right latissimus dorsi muscle, chronic.  Chronic volume loss and chronic airspace opacity at the right lung apex, not changed. The prior right rib osteotomy/deformities. Paraseptal emphysema, stable. No left lung nodule. Mild left apical pleural parenchymal scarring.  Lower cervical spondylosis.  IMPRESSION: 1. Stable appearance of the chest, without recurrence. 2. Emphysema 3. Chololithiasis.   Electronically Signed   By: Sherryl Barters M.D.   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 He is status post right upper lobectomy as well as course of concurrent chemoradiation followed by consolidation chemotherapy and has been observation since August of 2007 with no evidence for disease recurrence. He has been doing fine with no specific complaints. I discussed the scan results with the patient today. I recommended for him to continue on observation with repeat CT scan of the chest in one  year. He was advised to call immediately if he has any concerning symptoms in the interval.  The patient voices understanding of current disease status and treatment options and is in agreement with the current care plan.  All questions were answered. The patient knows to call the clinic with any problems, questions or concerns. We can certainly see the patient much sooner if necessary.  Disclaimer: This note was dictated with voice recognition software. Similar sounding words can inadvertently be transcribed and may be missed upon review.

## 2014-09-30 ENCOUNTER — Other Ambulatory Visit: Payer: Self-pay | Admitting: Internal Medicine

## 2014-09-30 DIAGNOSIS — R0600 Dyspnea, unspecified: Secondary | ICD-10-CM

## 2014-10-03 ENCOUNTER — Ambulatory Visit: Payer: Self-pay | Admitting: Adult Health

## 2014-10-07 ENCOUNTER — Encounter: Payer: Self-pay | Admitting: Adult Health

## 2014-10-07 ENCOUNTER — Ambulatory Visit (INDEPENDENT_AMBULATORY_CARE_PROVIDER_SITE_OTHER): Payer: Self-pay | Admitting: Internal Medicine

## 2014-10-07 ENCOUNTER — Ambulatory Visit (INDEPENDENT_AMBULATORY_CARE_PROVIDER_SITE_OTHER): Payer: Self-pay | Admitting: Adult Health

## 2014-10-07 VITALS — BP 128/78 | HR 88 | Temp 97.8°F | Ht 70.0 in | Wt 210.8 lb

## 2014-10-07 DIAGNOSIS — R0600 Dyspnea, unspecified: Secondary | ICD-10-CM

## 2014-10-07 DIAGNOSIS — Z006 Encounter for examination for normal comparison and control in clinical research program: Secondary | ICD-10-CM

## 2014-10-07 LAB — PULMONARY FUNCTION TEST
FEF 25-75 Post: 0.89 L/sec
FEF 25-75 Pre: 0.68 L/sec
FEF2575-%CHANGE-POST: 30 %
FEF2575-%PRED-POST: 29 %
FEF2575-%Pred-Pre: 22 %
FEV1-%CHANGE-POST: 9 %
FEV1-%PRED-POST: 47 %
FEV1-%Pred-Pre: 43 %
FEV1-PRE: 1.58 L
FEV1-Post: 1.73 L
FEV1FVC-%Change-Post: 1 %
FEV1FVC-%PRED-PRE: 69 %
FEV6-%Change-Post: 7 %
FEV6-%Pred-Post: 67 %
FEV6-%Pred-Pre: 62 %
FEV6-PRE: 2.9 L
FEV6-Post: 3.12 L
FEV6FVC-%Change-Post: 0 %
FEV6FVC-%PRED-PRE: 100 %
FEV6FVC-%Pred-Post: 99 %
FVC-%Change-Post: 8 %
FVC-%Pred-Post: 67 %
FVC-%Pred-Pre: 62 %
FVC-POST: 3.25 L
FVC-Pre: 3 L
POST FEV1/FVC RATIO: 53 %
POST FEV6/FVC RATIO: 96 %
PRE FEV6/FVC RATIO: 97 %
Pre FEV1/FVC ratio: 53 %

## 2014-10-07 NOTE — Progress Notes (Signed)
Spirometry pre and post done today. 

## 2014-10-07 NOTE — Progress Notes (Signed)
Subjective:    Patient ID: Stephen [name], male    DOB: 1956/03/25, 58 y.o.   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 Unchanged PFTs 2009 -> Feb 2012. Feb 2012 PFTs - fev1 1.97L/57%, Ratio 78%  - clas 2 dyspnea, Rx with spiriva and symbicort  - Low risk nuclear med scan heard 08/17/13 - Dr Debara Pickett   B) Quit smoking 2006   C) Rt lung cancer 2006. Ex--throat cancer 2007. RLL 18mm nodule feb 2012-> resolved May 2012 -> In remission sept 2014: Dr Earlie Server   D) Chronic sinus alllergies - Dr Donneta Romberg, Dr. Redmond Baseman.  - last allergy test fall 2012 - plan to increase shots and positive  - on weekly allergyt shots since 2006 per patient. But only poisitive to roach or silk   E) Recc AECOPD  - Aug 2012 x 2  - Dec 2012  - Early 2015: several Rx by PCP Jerlyn Ly, MD per his hx   F) Osteoporosis per hx   G) EOSINOPHILIA  - 400 in sept 2014   H) Hx of chronic allergy shots - Dr Donneta Romberg. 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 Dr Donneta Romberg discussion April/may 2015: ok to come off allergy shots if patients wants to go on research copd trials    OV 05/30/2014  Chief Complaint  Patient presents with  . Follow-up    With pft results. Pt states breathing is unchanged but is wheezing today. Pt c/o DOE, productive cough with green mucous. Denies CP.    OVerall well. COPD PFT fev 1 2L/54%, DLCO 68% and unchanged. PAst few days increased cough, dyspnea and green mucus but denies he is in AECOPD and does not want Rx for AECOPD. He wants to think about research; not decided yet. Finished rehab and felt better    OV 09/08/2014 Chief Complaint  Patient presents with  . Follow-up    Pt states Dr. Joylene Draft gave abx and pred for bronchitis. Pt states he is feeling much better. Pt states he is still SOB but no more than normal. Pt taking mucinex and it is helping.    Followup moderate COPD. Continue on Spiriva and Symbicort. 3 weeks ago had COPD exacerbation and was treated with  Augmentin and prednisone by his primary care physician. He has been off prednisone for 2 weeks. His known baseline health. There are no new issues. He wants him samples of his inhalers. Of note, he has not had Prevnar vaccine so we will give it to him today. He is extremely keen and participation in COPD research trials and is frustrated that he did not qualify for [employer] injectable study against eosinophils. Allergies hopeful that he might be qualify for this trial if he was rescreened   10/07/2014 Research Visit-#1 Screening Visit  IMPACT research study. A phase 3, 52 week, randomized double-blind,  3 -arm  parallel, group study, comparing the efficacy, safety and tolerability of the fixed dose triple combination FF/UMEC/VI with the fixed dose dual combination of FF/VI and UMEC/VI,  all administered once daily in the morning via dry powder inhaler in subjects with chronic obstructive pulmonary disease  Pt presents today for screening visit.  He has a known hx of Gold 3 COPD -MZ phenotype  On Symbicort and Spiriva .  PFT shows FEV1 at 43%, ratio 53, no sign BD response. FVC 62% Pt denies chest pain, orthopnea, edema , hemoptysis or thrush.  He is maintained with  He  is prone to frequent COPD flares requiring prednisone/abx.    Past Medical History  Diagnosis Date  . Allergic rhinitis, cause unspecified   . Other emphysema     copd  . Unspecified vitamin D deficiency   . Dyspnea   . Osteoporosis   . Thrombosed hemorrhoids   . Blindness of left eye   . Hypothyroidism   . Lung cancer     lung ca dx 06  . Throat cancer     throat ca dx 2007         Review of Systems  Constitutional:   No  weight loss, night sweats,  Fevers, chills,  +fatigue, or  lassitude.  HEENT:   No headaches,  Difficulty swallowing,  Tooth/dental problems, or  Sore throat,                No sneezing, itching, ear ache, nasal congestion, post nasal drip,   CV:  No chest pain,  Orthopnea, PND, swelling  in lower extremities, anasarca, dizziness, palpitations, syncope.   GI  No heartburn, indigestion, abdominal pain, nausea, vomiting, diarrhea, change in bowel habits, loss of appetite, bloody stools.   Resp:    No chest wall deformity  Skin: no rash or lesions.  GU: no dysuria, change in color of urine, no urgency or frequency.  No flank pain, no hematuria   MS:  No joint pain or swelling.  No decreased range of motion.  No back pain.  Psych:  No change in mood or affect. No depression or anxiety.  No memory loss.    '     Objective:   GEN: A/Ox3; pleasant , NAD,   HEENT:  [place]/AT,  EACs-clear, TMs-wnl, NOSE-clear, THROAT-clear, no lesions, no postnasal drip or exudate noted.   NECK:  Supple w/ fair ROM; no JVD; normal carotid impulses w/o bruits; no thyromegaly or nodules palpated; no lymphadenopathy.  RESP  Decreased BS in bases no accessory muscle use, no dullness to percussion  CARD:  RRR, no m/r/g  , no peripheral edema, pulses intact, no cyanosis or clubbing.  GI:   Soft & nt; nml bowel sounds; no organomegaly or masses detected.  Musco: Warm bil, no deformities or joint swelling noted.   Neuro: alert, no focal deficits noted.    Skin: Warm, no lesions or rashes

## 2014-10-07 NOTE — Assessment & Plan Note (Signed)
MPACT research study. A phase 3, 52 week, randomized double-blind,  3 -arm  parallel, group study, comparing the efficacy, safety and tolerability of the fixed dose triple combination FF/UMEC/VI with the fixed dose dual combination of FF/VI and UMEC/VI,  all administered once daily in the morning via dry powder inhaler in subjects with chronic obstructive pulmonary disease  Screening visit  Research protocol reviewed with pt and questions answered for pt.  Pt exam is unrevealing , no oral candidiasis noted.   Plan  Cont w/ research protocol follow up

## 2014-10-17 ENCOUNTER — Other Ambulatory Visit: Payer: Self-pay | Admitting: Internal Medicine

## 2014-10-17 DIAGNOSIS — R0600 Dyspnea, unspecified: Secondary | ICD-10-CM

## 2014-10-18 ENCOUNTER — Encounter: Payer: Self-pay | Admitting: Adult Health

## 2014-10-18 ENCOUNTER — Ambulatory Visit (INDEPENDENT_AMBULATORY_CARE_PROVIDER_SITE_OTHER): Payer: 59 | Admitting: Internal Medicine

## 2014-10-18 ENCOUNTER — Ambulatory Visit (INDEPENDENT_AMBULATORY_CARE_PROVIDER_SITE_OTHER): Payer: 59 | Admitting: Adult Health

## 2014-10-18 VITALS — BP 126/74 | HR 70 | Temp 98.4°F | Ht 69.0 in | Wt 209.0 lb

## 2014-10-18 DIAGNOSIS — R0600 Dyspnea, unspecified: Secondary | ICD-10-CM

## 2014-10-18 DIAGNOSIS — Z006 Encounter for examination for normal comparison and control in clinical research program: Secondary | ICD-10-CM

## 2014-10-18 DIAGNOSIS — J449 Chronic obstructive pulmonary disease, unspecified: Secondary | ICD-10-CM

## 2014-10-18 LAB — PULMONARY FUNCTION TEST
FEF 25-75 POST: 0.76 L/s
FEF 25-75 Pre: 0.7 L/sec
FEF2575-%CHANGE-POST: 9 %
FEF2575-%Pred-Post: 24 %
FEF2575-%Pred-Pre: 22 %
FEV1-%Change-Post: 2 %
FEV1-%PRED-POST: 46 %
FEV1-%PRED-PRE: 44 %
FEV1-POST: 1.7 L
FEV1-Pre: 1.65 L
FEV1FVC-%CHANGE-POST: 0 %
FEV1FVC-%Pred-Pre: 69 %
FEV6-%Change-Post: 2 %
FEV6-%Pred-Post: 66 %
FEV6-%Pred-Pre: 64 %
FEV6-PRE: 2.99 L
FEV6-Post: 3.06 L
FEV6FVC-%Change-Post: 0 %
FEV6FVC-%Pred-Post: 100 %
FEV6FVC-%Pred-Pre: 100 %
FVC-%CHANGE-POST: 2 %
FVC-%PRED-POST: 66 %
FVC-%Pred-Pre: 64 %
FVC-Post: 3.18 L
FVC-Pre: 3.11 L
POST FEV1/FVC RATIO: 53 %
PRE FEV1/FVC RATIO: 53 %
Post FEV6/FVC ratio: 97 %
Pre FEV6/FVC Ratio: 96 %

## 2014-10-18 NOTE — Assessment & Plan Note (Signed)
Compensated on present regimen  follow up Dr. Chase Caller as planned and As needed

## 2014-10-18 NOTE — Progress Notes (Signed)
Spirometry pre and post done today. 

## 2014-10-18 NOTE — Progress Notes (Signed)
IMPACT VISIT 2:  Subject returns for visit 2 randomization per protocol for the Impact COPD study.  Subject has answered all questionaires for this visit, reports no A.E's at this time.  He was reminded to always bring his study drug and diary to all visits.  He expressed understanding and is happy to begin study drug.  Subject will begin study drug following visit with Tammy P., please see study shadow chart for further study results.

## 2014-10-18 NOTE — Assessment & Plan Note (Signed)
IMPACT research visit  Randomization visit today  Pt education given  No oral candidiasis noted.  follow up per research protocol.

## 2014-10-18 NOTE — Progress Notes (Signed)
   Subjective:    Patient ID: Stephen [name], male    DOB: [birth year], 57 y.o.   MRN: [id number]  HPI   10/18/2014  Research Visit-#2 Randomization Visit  IMPACT research study. A phase 3, 52 week, randomized double-blind,  3 -arm  parallel, group study, comparing the efficacy, safety and tolerability of the fixed dose triple combination FF/UMEC/VI with the fixed dose dual combination of FF/VI and UMEC/VI,  all administered once daily in the morning via dry powder inhaler in subjects with chronic obstructive pulmonary disease  Pt presents today for randomization visit .  He has a known hx of Gold 3 COPD -MZ phenotype . Former smoker . On Symbicort and Spiriva .  PFT shows FEV1 at 44%, ratio 53, no sign BD response. FVC 64% Pt denies chest pain, orthopnea, edema , hemoptysis or thrush.  He is prone to frequent COPD flares requiring prednisone/abx.    Review of Systems   Reviewed with pt .        Objective:   Physical Exam GEN: A/Ox3; pleasant , NAD, well nourished   HEENT:  [place]/AT,  EACs-clear, TMs-wnl, NOSE-clear, THROAT-clear, no lesions, no postnasal drip or exudate noted, no oral candidiasis noted.   NECK:  Supple w/ fair ROM; no JVD; normal carotid impulses w/o bruits; no thyromegaly or nodules palpated; no lymphadenopathy.  RESP Decreased BS in bases ,  w/o, wheezes/ rales/ or rhonchi.no accessory muscle use, no dullness to percussion  CARD:  RRR, no m/r/g  , no peripheral edema, pulses intact, no cyanosis or clubbing.  GI:   Soft & nt; nml bowel sounds; no organomegaly or masses detected.  Musco: Warm bil, no deformities or joint swelling noted.   Neuro: alert, no focal deficits noted.    Skin: Warm, no lesions or rashes         Assessment & Plan:

## 2014-10-18 NOTE — Patient Instructions (Signed)
Follow up per research protocol  follow up Dr. Chase Caller in 2 months and As needed

## 2014-11-14 ENCOUNTER — Other Ambulatory Visit: Payer: Self-pay | Admitting: Internal Medicine

## 2014-11-14 DIAGNOSIS — J441 Chronic obstructive pulmonary disease with (acute) exacerbation: Secondary | ICD-10-CM

## 2014-11-15 ENCOUNTER — Ambulatory Visit (INDEPENDENT_AMBULATORY_CARE_PROVIDER_SITE_OTHER): Payer: 59 | Admitting: Internal Medicine

## 2014-11-15 ENCOUNTER — Ambulatory Visit (INDEPENDENT_AMBULATORY_CARE_PROVIDER_SITE_OTHER): Payer: 59 | Admitting: Adult Health

## 2014-11-15 ENCOUNTER — Ambulatory Visit: Payer: 59 | Admitting: Adult Health

## 2014-11-15 ENCOUNTER — Encounter: Payer: Self-pay | Admitting: Adult Health

## 2014-11-15 VITALS — BP 132/84 | HR 77 | Temp 98.1°F | Resp 18 | Ht 70.0 in | Wt 211.0 lb

## 2014-11-15 DIAGNOSIS — Z006 Encounter for examination for normal comparison and control in clinical research program: Secondary | ICD-10-CM

## 2014-11-15 DIAGNOSIS — J441 Chronic obstructive pulmonary disease with (acute) exacerbation: Secondary | ICD-10-CM

## 2014-11-15 LAB — PULMONARY FUNCTION TEST
FEF 25-75 POST: 1.13 L/s
FEF 25-75 Pre: 0.72 L/sec
FEF2575-%Change-Post: 55 %
FEF2575-%Pred-Post: 36 %
FEF2575-%Pred-Pre: 23 %
FEV1-%CHANGE-POST: 16 %
FEV1-%PRED-POST: 53 %
FEV1-%Pred-Pre: 46 %
FEV1-POST: 1.97 L
FEV1-Pre: 1.69 L
FEV1FVC-%CHANGE-POST: 3 %
FEV1FVC-%Pred-Pre: 69 %
FEV6-%Change-Post: 12 %
FEV6-%PRED-PRE: 66 %
FEV6-%Pred-Post: 74 %
FEV6-PRE: 3.06 L
FEV6-Post: 3.46 L
FEV6FVC-%Change-Post: -1 %
FEV6FVC-%PRED-PRE: 101 %
FEV6FVC-%Pred-Post: 100 %
FVC-%Change-Post: 13 %
FVC-%Pred-Post: 74 %
FVC-%Pred-Pre: 65 %
FVC-PRE: 3.18 L
FVC-Post: 3.6 L
POST FEV6/FVC RATIO: 96 %
PRE FEV6/FVC RATIO: 97 %
Post FEV1/FVC ratio: 55 %
Pre FEV1/FVC ratio: 53 %

## 2014-11-15 NOTE — Progress Notes (Signed)
IMPACT VISIT 3: Subject returns for visit 3.  He has no new  complaints at this time.  I asked about an alert received yesterday about increased symptoms, patient explained that he has been out mowing leaves and that is what caused that, but he feels better at this time. Reminded patient to call should there be any new problems or concerns.  Patient did not return with study drug but was instructed not to use that one anymore and to save it for his return visit in February.  He will see Tammy P. To ensure he does not have any oral candidiasis.

## 2014-11-15 NOTE — Progress Notes (Signed)
   Subjective:    Patient ID: Stephen [name], male    DOB: [birth year], 58 y.o.   MRN: [id number]  HPI  He has a known hx of Gold 3 COPD -MZ phenotype . Former smoker .    11/15/2014  Research Visit-#3 Follow up Visit  IMPACT research study. A phase 3, 52 week, randomized double-blind,  3 -arm  parallel, group study, comparing the efficacy, safety and tolerability of the fixed dose triple combination FF/UMEC/VI with the fixed dose dual combination of FF/VI and UMEC/VI,  all administered once daily in the morning via dry powder inhaler in subjects with chronic obstructive pulmonary disease Says he is doing well on study drug with no flare in symptoms .    Review of Systems   Reviewed with pt .        Objective:   Physical Exam  Limited  GEN: A/Ox3; pleasant , NAD, well nourished   HEENT:  No oral candidiasis

## 2014-11-15 NOTE — Assessment & Plan Note (Signed)
IMPACT study  Visit 3 w/ no oral candidiasis noted   Plan  Cont w/ research protocol

## 2014-11-15 NOTE — Progress Notes (Signed)
Spirometry pre and post done today. 

## 2015-01-12 ENCOUNTER — Encounter: Payer: Self-pay | Admitting: Internal Medicine

## 2015-01-12 ENCOUNTER — Ambulatory Visit (INDEPENDENT_AMBULATORY_CARE_PROVIDER_SITE_OTHER): Payer: 59 | Admitting: Internal Medicine

## 2015-01-12 VITALS — BP 132/72 | HR 73 | Ht 70.0 in | Wt 206.0 lb

## 2015-01-12 DIAGNOSIS — J449 Chronic obstructive pulmonary disease, unspecified: Secondary | ICD-10-CM

## 2015-01-12 DIAGNOSIS — Z006 Encounter for examination for normal comparison and control in clinical research program: Secondary | ICD-10-CM

## 2015-01-12 NOTE — Progress Notes (Signed)
Subjective:    Patient ID: Stephen [name], male    DOB: 1956-01-13, 60 y.o.   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 Unchanged PFTs 2009 -> Feb 2012. Feb 2012 PFTs - fev1 1.97L/57%, Ratio 78%  - clas 2 dyspnea, Rx with spiriva and symbicort  - Low risk nuclear med scan heard 08/17/13 - Dr Debara Pickett   B) Quit smoking 2006   C) Rt lung cancer 2006. Ex--throat cancer 2007. RLL 33mm nodule feb 2012-> resolved May 2012 -> In remission sept 2014 -> sept 2015: Dr Earlie Server   D) Chronic sinus alllergies - Dr Donneta Romberg, Dr. Redmond Baseman.  - last allergy test fall 2012 - plan to increase shots and positive  - on weekly allergyt shots since 2006 per patient. But only poisitive to roach or silk   E) Recc AECOPD  - Aug 2012 x 2  - Dec 2012  - Early 2015: several Rx by PCP Jerlyn Ly, MD per his hx   F) Osteoporosis per hx   G) EOSINOPHILIA  - 400 in sept 2014   H) Hx of chronic allergy shots - Dr Donneta Romberg. 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 Dr Donneta Romberg discussion April/may 2015: ok to come off allergy shots if patients wants to go on research copd trials. OIff allergy shots as of 01/12/2015      OV 01/12/2015  Chief Complaint  Patient presents with  . Follow-up    Pt here for ROV. Pt stated his breahting is unchanged. Pt c/o DOE, mild prod cough with white mucus. Pt denies CP/tightness.    This is regular visit for COPD Gold stage 2. This is not a research visit. This is conventional care visit for his COPD  He is now on study drug GSK IMPACT research study since approx oct 0105. : blinded >/= 52 week randomized trial of ANORO equivalent v BREO equivalent v GSK triple MDI (ICS + LABA + LAMA). He says he is doing well. Since entering the recent study in October 2015 overall he says his health is better although his dyspnea on exertion still persists class II but it is better. Cough is only mild to moderate. There no new issues. He is compliant with  research protocol.  There no history of any exacerbations since starting the research protocol. He is off allergy  shots  SEpt 2015 - ct chest - no lung cancer recurrence  Spirometry 11/15/2014 shows an FEV1 of 1.7 L/46%, FVC of 2.2 L/65% and a ratio 53. Postbronchodilator his FEV1 is 1.97/52% and these show stability  Immunization History  Administered Date(s) Administered  . Influenza Split 08/30/2013, 09/08/2014  . Influenza Whole 10/01/2010, 10/02/2011  . Pneumococcal Conjugate-13 09/08/2014  . Pneumococcal Polysaccharide-23 10/30/2009     Review of Systems  Constitutional: Negative for fever and unexpected weight change.  HENT: Negative for congestion, dental problem, ear pain, nosebleeds, postnasal drip, rhinorrhea, sinus pressure, sneezing, sore throat and trouble swallowing.   Eyes: Negative for redness and itching.  Respiratory: Positive for cough and shortness of breath. Negative for chest tightness and wheezing.   Cardiovascular: Negative for palpitations and leg swelling.  Gastrointestinal: Negative for nausea and vomiting.  Genitourinary: Negative for dysuria.  Musculoskeletal: Negative for joint swelling.  Skin: Negative for rash.  Neurological: Negative for headaches.  Hematological: Does not bruise/bleed easily.  Psychiatric/Behavioral: Negative for dysphoric mood. The patient is not nervous/anxious.     Current outpatient prescriptions:  .  albuterol (PROVENTIL HFA;VENTOLIN HFA) 108 (90 BASE) MCG/ACT inhaler, Inhale 2 puffs into the lungs every 6 (six) hours as needed.  , Disp: , Rfl:  .  albuterol (PROVENTIL) (2.5 MG/3ML) 0.083% nebulizer solution, Take 2.5 mg by nebulization every 6 (six) hours as needed.  , Disp: , Rfl:  .  alendronate (FOSAMAX) 70 MG tablet, Take 70 mg by mouth every 7 (seven) days. Take with a full glass of water on an empty stomach. On Mondays, Disp: , Rfl:  .  aspirin 81 MG tablet, Take 81 mg by mouth daily.  , Disp: , Rfl:  .  levothyroxine (SYNTHROID,  LEVOTHROID) 125 MCG tablet, Take 67.5 mcg by mouth daily before breakfast. , Disp: , Rfl:  .  nitroGLYCERIN (NITROSTAT) 0.4 MG SL tablet, Place 1 tablet (0.4 mg total) under the tongue every 5 (five) minutes as needed for chest pain., Disp: 20 tablet, Rfl: 0 .  NON FORMULARY, Research drug, Disp: , Rfl:  .  pravastatin (PRAVACHOL) 40 MG tablet, Take 40 mg by mouth every evening. , Disp: , Rfl:  .  temazepam (RESTORIL) 30 MG capsule, Take 30 mg by mouth at bedtime as needed for sleep., Disp: , Rfl:  .  Vitamin D, Ergocalciferol, (DRISDOL) 50000 UNITS CAPS, Take 1 tablet by mouth Once every 2 weeks., Disp: , Rfl:  .  fluticasone (FLONASE) 50 MCG/ACT nasal spray, Place 2 sprays into both nostrils daily. (Patient not taking: Reported on 01/12/2015), Disp: 16 g, Rfl: 2 .  omeprazole (PRILOSEC) 20 MG capsule, Take 1 capsule (20 mg total) by mouth daily. (Patient not taking: Reported on 01/12/2015), Disp: 30 capsule, Rfl: 11  - RESEARCH DRUG GSK IMPACT research study: blinded >/= 52 week randomized trial of ANORO equivalent v BREO equivalent v GSK triple MDI (ICS + LABA + LAMA)      Objective:   Physical Exam  Constitutional: He is oriented to person, place, and time. He appears well-developed and well-nourished. No distress.  HENT:  Head: Normocephalic and atraumatic.  Right Ear: External ear normal.  Left Ear: External ear normal.  Mouth/Throat: Oropharynx is clear and moist. No oropharyngeal exudate.  Eyes: Conjunctivae and EOM are normal. Pupils are equal, round, and reactive to light. Right eye exhibits no discharge. Left eye exhibits no discharge. No scleral icterus.  Neck: Normal range of motion. Neck supple. No JVD present. No tracheal deviation present. No thyromegaly present.  Cardiovascular: Normal rate, regular rhythm and intact distal pulses.  Exam reveals no gallop and no friction rub.   No murmur heard. Pulmonary/Chest: Effort normal and breath sounds normal. No respiratory distress. He  has no wheezes. He has no rales. He exhibits no tenderness.  Always had a wheeze prior to research study -now none  Abdominal: Soft. Bowel sounds are normal. He exhibits no distension and no mass. There is no tenderness. There is no rebound and no guarding.  Musculoskeletal: Normal range of motion. He exhibits no edema or tenderness.  Lymphadenopathy:    He has no cervical adenopathy.  Neurological: He is alert and oriented to person, place, and time. He has normal reflexes. No cranial nerve deficit. Coordination normal.  Skin: Skin is warm and dry. No rash noted. He is not diaphoretic. No erythema. No pallor.  Psychiatric: He has a normal mood and affect. His behavior is normal. Judgment and thought content normal.  Nursing note and vitals reviewed.   Filed Vitals:   01/12/15 1458  BP: 132/72  Pulse: 73  Height: 5'  10" (1.778 m)  Weight: 206 lb (93.441 kg)  SpO2: 97%  ;ls     Assessment & Plan:     ICD-9-CM ICD-10-CM   1. COPD, severe 496 J44.9   2. Patient in clinical research study V70.7 Z00.6    This is a routine care vsiit for COPD. This is NOT a research visit.   His COPD stable. It is interesting that since starting the research protocol he has not had any exacerbation. In fact on exam he does not have a wheeze but is in the past he always had a wheeze. Currently have recommended that he continues albuterol as needed and the recent study drug. I will see him back for routine follow-up for COPD and 10 months  Dr. Brand Males, M.D., [facility].C.P Pulmonary and Critical Care Medicine Staff Physician [place] [facility] Pager: [phone], If no answer or between  15:00h - 7:00h: call [phone]  01/12/2015 3:42 PM

## 2015-01-12 NOTE — Patient Instructions (Addendum)
ICD-9-CM ICD-10-CM   1. COPD, severe 496 J44.9   2. Patient in clinical research study V70.7 Z00.6    Glad is stable  Glad uptodate with vaccines Continue research inhaler + albuterol as needed  FOllowup 10 months for COPD , routine  care, non-research visit

## 2015-01-23 ENCOUNTER — Ambulatory Visit (INDEPENDENT_AMBULATORY_CARE_PROVIDER_SITE_OTHER)
Admission: RE | Admit: 2015-01-23 | Discharge: 2015-01-23 | Disposition: A | Discharge status: Home / Self Care | Payer: 59 | Source: Ambulatory Visit | Attending: Internal Medicine | Admitting: Internal Medicine

## 2015-01-23 ENCOUNTER — Ambulatory Visit (INDEPENDENT_AMBULATORY_CARE_PROVIDER_SITE_OTHER): Payer: 59 | Admitting: Internal Medicine

## 2015-01-23 ENCOUNTER — Encounter: Payer: Self-pay | Admitting: Internal Medicine

## 2015-01-23 VITALS — BP 98/88 | HR 105 | Temp 97.4°F | Ht 70.0 in | Wt 207.2 lb

## 2015-01-23 DIAGNOSIS — J441 Chronic obstructive pulmonary disease with (acute) exacerbation: Secondary | ICD-10-CM

## 2015-01-23 DIAGNOSIS — Z006 Encounter for examination for normal comparison and control in clinical research program: Secondary | ICD-10-CM

## 2015-01-23 MED ORDER — LEVALBUTEROL HCL 0.63 MG/3ML IN NEBU
0.6300 mg | INHALATION_SOLUTION | Freq: Once | RESPIRATORY_TRACT | Status: AC
  Administered 2015-01-23: 0.63 mg via RESPIRATORY_TRACT

## 2015-01-23 MED ORDER — LEVOFLOXACIN 500 MG PO TABS: 500.0000 mg | ORAL_TABLET | Freq: Every day | ORAL | Status: DC

## 2015-01-23 MED ORDER — PREDNISONE 10 MG PO TABS: ORAL_TABLET | ORAL | Status: DC

## 2015-01-23 MED ORDER — METHYLPREDNISOLONE ACETATE 80 MG/ML IJ SUSP
80.0000 mg | Freq: Once | INTRAMUSCULAR | Status: AC
  Administered 2015-01-23: 80 mg via INTRAMUSCULAR

## 2015-01-23 NOTE — Patient Instructions (Addendum)
ICD-9-CM ICD-10-CM   1. COPD exacerbation 491.21 J44.1 DG Chest 2 View     levalbuterol (XOPENEX) nebulizer solution 0.63 mg  2. Research study patient V70.7 Z00.6    Xopenex neb in office IM depot medrol 80mg  x 1 in office Do CXR 2 view - rule out pneumonia START levaquin 500mg  once daily  X 7 days\ STARTe prednisone 40 mg daily x 2 days, then 20mg  daily x 2 days, then 10mg  daily x 2 days, then 5mg  daily x 2 days and stop Continue research drug  Followup  If worse/no better - call [phone] or go to ER immediately  IF well then follow per research protocol Routine visit 10 months as scheduled at visit 12/30/12/16

## 2015-01-23 NOTE — Progress Notes (Signed)
Subjective:    Patient ID: Stephen [name], male    DOB: March 27, 1956, 59 y.o.   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 Unchanged PFTs 2009 -> Feb 2012. Feb 2012 PFTs - fev1 1.97L/57%, Ratio 78%  - clas 2 dyspnea, Rx with spiriva and symbicort  - Low risk nuclear med scan heard 08/17/13 - Dr Debara Pickett   B) Quit smoking 2006   C) Rt lung cancer 2006. Ex--throat cancer 2007. RLL 51mm nodule feb 2012-> resolved May 2012 -> In remission sept 2014 -> sept 2015: Dr Earlie Server   D) Chronic sinus alllergies - Dr Donneta Romberg, Dr. Redmond Baseman.  - last allergy test fall 2012 - plan to increase shots and positive  - on weekly allergyt shots since 2006 per patient. But only poisitive to roach or silk   E) Recc AECOPD  - Aug 2012 x 2  - Dec 2012  - Early 2015: several Rx by PCP Jerlyn Ly, MD per his hx   F) Osteoporosis per hx   G) EOSINOPHILIA  - 400 in sept 2014   H) Hx of chronic allergy shots - Dr Donneta Romberg. 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 Dr Donneta Romberg discussion April/may 2015: ok to come off allergy shots if patients wants to go on research copd trials. OIff allergy shots as of 01/12/2015  OV 01/23/2015   This is an ACUTE regular visit for COPD Gold stage 2. This is not a research visit. This is conventional care visit for his COPD.   He is now on study drug GSK IMPACT research study since approx oct 2015. : blinded >/= 52 week randomized trial of ANORO equivalent v BREO equivalent v GSK triple MDI (ICS + LABA + LAMA). He says he is doing well. Since entering the recent study in October 2015 overall he says his health is better although his dyspnea on exertion still persists class II but it is better. Cough is only mild to moderate.  Last regular visit was 01/12/15; just over 10 days ago.   He is compliant with  research protocol.Marland Kitchen He is off allergy  Shots  However, since Friday 01/20/15 (the days of Lutricia Horsfall)  sudden onset sinus congestion, cough, green sputum, increased  dyspnea, wheeze, fever and chills.  Did not call or seek help due to bad weather. Lot of fver last night. Needed 4 blankets to feel normothermic. Does not want admission now. Feels he can get better with just home antibiotic treatment.   SEpt 2015 - ct chest - no lung cancer recurrence  Spirometry 11/15/2014 shows an FEV1 of 1.7 L/46%, FVC of 2.2 L/65% and a ratio 53. Postbronchodilator his FEV1 is 1.97/52% and these show stability   Allergies  Allergen Reactions  . Codeine Other (See Comments)    unknown  . Cymbalta [Duloxetine Hcl] Nausea And Vomiting  . Montelukast Sodium Other (See Comments)    Causes sinusitis       Review of Systems  Constitutional: Positive for fever. Negative for unexpected weight change.  HENT: Positive for congestion, ear pain and sinus pressure. Negative for dental problem, nosebleeds, postnasal drip, rhinorrhea, sneezing, sore throat and trouble swallowing.   Eyes: Positive for redness and itching.  Respiratory: Positive for cough, chest tightness, shortness of breath and wheezing.   Cardiovascular: Negative for palpitations and leg swelling.  Gastrointestinal: Negative for nausea and vomiting.  Genitourinary: Negative for dysuria.  Musculoskeletal: Negative for joint swelling.  Skin: Negative  for rash.  Neurological: Positive for headaches.  Hematological: Does not bruise/bleed easily.  Psychiatric/Behavioral: Negative for dysphoric mood. The patient is not nervous/anxious.     Current outpatient prescriptions:  .  albuterol (PROVENTIL HFA;VENTOLIN HFA) 108 (90 BASE) MCG/ACT inhaler, Inhale 2 puffs into the lungs every 6 (six) hours as needed.  , Disp: , Rfl:  .  albuterol (PROVENTIL) (2.5 MG/3ML) 0.083% nebulizer solution, Take 2.5 mg by nebulization every 6 (six) hours as needed.  , Disp: , Rfl:  .  alendronate (FOSAMAX) 70 MG tablet, Take 70 mg by mouth every 7 (seven) days. Take with a full glass of water on an empty stomach. On Mondays, Disp: ,  Rfl:  .  aspirin 81 MG tablet, Take 81 mg by mouth daily.  , Disp: , Rfl:  .  levothyroxine (SYNTHROID, LEVOTHROID) 125 MCG tablet, Take 67.5 mcg by mouth daily before breakfast. , Disp: , Rfl:  .  nitroGLYCERIN (NITROSTAT) 0.4 MG SL tablet, Place 1 tablet (0.4 mg total) under the tongue every 5 (five) minutes as needed for chest pain., Disp: 20 tablet, Rfl: 0 .  NON FORMULARY, Research drug, Disp: , Rfl:  .  omeprazole (PRILOSEC) 20 MG capsule, Take 1 capsule (20 mg total) by mouth daily., Disp: 30 capsule, Rfl: 11 .  pravastatin (PRAVACHOL) 40 MG tablet, Take 40 mg by mouth every evening. , Disp: , Rfl:  .  temazepam (RESTORIL) 30 MG capsule, Take 30 mg by mouth at bedtime as needed for sleep., Disp: , Rfl:  .  Vitamin D, Ergocalciferol, (DRISDOL) 50000 UNITS CAPS, Take 1 tablet by mouth Once every 2 weeks., Disp: , Rfl:      Objective:   Physical Exam  Constitutional: He is oriented to person, place, and time. He appears well-developed and well-nourished. No distress.  Not his usual chirpy self Sitting with head down and palms on face  HENT:  Head: Normocephalic and atraumatic.  Right Ear: External ear normal.  Left Ear: External ear normal.  Mouth/Throat: Oropharynx is clear and moist. No oropharyngeal exudate.  Eyes: Conjunctivae and EOM are normal. Pupils are equal, round, and reactive to light. Right eye exhibits no discharge. Left eye exhibits no discharge. No scleral icterus.  Neck: Normal range of motion. Neck supple. No JVD present. No tracheal deviation present. No thyromegaly present.  Cardiovascular: Normal rate, regular rhythm and intact distal pulses.  Exam reveals no gallop and no friction rub.   No murmur heard. Pulmonary/Chest: Effort normal and breath sounds normal. No respiratory distress. He has no wheezes. He has no rales. He exhibits no tenderness.  Mild left basal wheeze Mild tachypnea No acc muscle use Good air entry  Abdominal: Soft. Bowel sounds are normal.  He exhibits no distension and no mass. There is no tenderness. There is no rebound and no guarding.  Musculoskeletal: Normal range of motion. He exhibits no edema or tenderness.  Lymphadenopathy:    He has no cervical adenopathy.  Neurological: He is alert and oriented to person, place, and time. He has normal reflexes. No cranial nerve deficit. Coordination normal.  Skin: Skin is warm and dry. No rash noted. He is not diaphoretic. No erythema. No pallor.  Psychiatric: He has a normal mood and affect. His behavior is normal. Judgment and thought content normal.  Nursing note and vitals reviewed.   Filed Vitals:   01/23/15 1016  BP: 98/88  Pulse: 105  Temp: 97.4 F (36.3 C)  TempSrc: Oral  Height: 5\' 10"  (1.778  m)  Weight: 207 lb 3.2 oz (93.985 kg)  SpO2: 94%         Assessment & Plan:     ICD-9-CM ICD-10-CM   1. COPD exacerbation 491.21 J44.1 DG Chest 2 View     levalbuterol (XOPENEX) nebulizer solution 0.63 mg  2. Research study patient V70.7 Z00.6     Xopenex neb in office IM depot medrol 80mg  x 1 in office Do CXR 2 view - rule out pneumonia START levaquin 500mg  once daily  X 7 days\ STARTe prednisone 40 mg daily x 2 days, then 20mg  daily x 2 days, then 10mg  daily x 2 days, then 5mg  daily x 2 days and stop Continue research drug   Followup  If worse/no better - call [phone] or go to ER immediately  IF well then follow per research protocol Routine visit 10 months as scheduled at visit 12/30/12/16    Dr. Brand Males, M.D., F.C.C.P Pulmonary and Critical Care Medicine Staff Physician [place] [facility] Pager: [phone], If no answer or between  15:00h - 7:00h: call [phone]  01/23/2015 10:36 AM

## 2015-01-24 ENCOUNTER — Telehealth: Payer: Self-pay | Admitting: Internal Medicine

## 2015-01-24 NOTE — Telephone Encounter (Signed)
Let Stephen [name] know CXR has NO PNA. Continue advise from yesterday  Dr. Brand Males, M.D., [facility].C.P Pulmonary and Critical Care Medicine Staff Physician [place] [facility] Pager: [phone], If no answer or between  15:00h - 7:00h: call [phone]  01/24/2015 9:09 AM

## 2015-01-25 NOTE — Telephone Encounter (Signed)
Called and spoke to pt. Informed pt of the results and recs per MR. Pt verbalized understanding and denied any further questions or concerns at this time.

## 2015-02-08 ENCOUNTER — Other Ambulatory Visit: Payer: Self-pay | Admitting: Internal Medicine

## 2015-02-08 ENCOUNTER — Ambulatory Visit: Payer: 59 | Admitting: Adult Health

## 2015-02-08 DIAGNOSIS — R0600 Dyspnea, unspecified: Secondary | ICD-10-CM

## 2015-02-09 ENCOUNTER — Ambulatory Visit: Payer: 59 | Admitting: Adult Health

## 2015-02-09 ENCOUNTER — Encounter: Payer: Self-pay | Admitting: Adult Health

## 2015-02-09 ENCOUNTER — Ambulatory Visit (INDEPENDENT_AMBULATORY_CARE_PROVIDER_SITE_OTHER): Payer: 59 | Admitting: Adult Health

## 2015-02-09 ENCOUNTER — Ambulatory Visit (INDEPENDENT_AMBULATORY_CARE_PROVIDER_SITE_OTHER): Payer: 59 | Admitting: Internal Medicine

## 2015-02-09 VITALS — BP 116/82 | HR 69 | Temp 97.6°F | Wt 209.0 lb

## 2015-02-09 DIAGNOSIS — R0600 Dyspnea, unspecified: Secondary | ICD-10-CM

## 2015-02-09 DIAGNOSIS — J441 Chronic obstructive pulmonary disease with (acute) exacerbation: Secondary | ICD-10-CM

## 2015-02-09 DIAGNOSIS — Z006 Encounter for examination for normal comparison and control in clinical research program: Secondary | ICD-10-CM

## 2015-02-09 LAB — PULMONARY FUNCTION TEST
FEF 25-75 PRE: 0.84 L/s
FEF 25-75 Post: 1.06 L/sec
FEF2575-%Change-Post: 25 %
FEF2575-%PRED-POST: 34 %
FEF2575-%Pred-Pre: 27 %
FEV1-%Change-Post: 7 %
FEV1-%PRED-PRE: 48 %
FEV1-%Pred-Post: 52 %
FEV1-POST: 1.93 L
FEV1-Pre: 1.79 L
FEV1FVC-%Change-Post: 0 %
FEV1FVC-%Pred-Pre: 75 %
FEV6-%Change-Post: 8 %
FEV6-%Pred-Post: 71 %
FEV6-%Pred-Pre: 65 %
FEV6-POST: 3.3 L
FEV6-Pre: 3.03 L
FEV6FVC-%CHANGE-POST: 1 %
FEV6FVC-%Pred-Post: 102 %
FEV6FVC-%Pred-Pre: 101 %
FVC-%CHANGE-POST: 7 %
FVC-%Pred-Post: 69 %
FVC-%Pred-Pre: 64 %
FVC-PRE: 3.12 L
FVC-Post: 3.34 L
POST FEV1/FVC RATIO: 58 %
PRE FEV1/FVC RATIO: 57 %
Post FEV6/FVC ratio: 99 %
Pre FEV6/FVC Ratio: 97 %

## 2015-02-09 MED ORDER — PREDNISONE 10 MG PO TABS: ORAL_TABLET | ORAL | Status: DC

## 2015-02-09 MED ORDER — AMOXICILLIN-POT CLAVULANATE 875-125 MG PO TABS: 1.0000 | ORAL_TABLET | Freq: Two times a day (BID) | ORAL | Status: AC

## 2015-02-09 NOTE — Assessment & Plan Note (Signed)
Slow to resolve exacerbation   Plan  Augmentin 875 mg twice daily for 7 days Prednisone taper over the next week Mucinex DM twice daily as needed for cough and congestion Fluids and rest Continuous study protocol Please contact office for sooner follow up if symptoms do not improve or worsen or seek emergency care  follow up as planned and As needed  With Dr. Chase Caller

## 2015-02-09 NOTE — Progress Notes (Signed)
Spirometry pre and post done today. Research

## 2015-02-09 NOTE — Patient Instructions (Signed)
Augmentin 875 mg twice daily for 7 days Prednisone taper over the next week Mucinex DM twice daily as needed for cough and congestion Fluids and rest Continuous study protocol Please contact office for sooner follow up if symptoms do not improve or worsen or seek emergency care  follow up as planned and As needed  With Dr. Chase Caller

## 2015-02-09 NOTE — Progress Notes (Signed)
   Subjective:    Patient ID: Stephen [name], male    DOB: 10/01/1956, 59 y.o.   MRN: [id number]  HPI  74 with  Gold 3 COPD -MZ phenotype . Former smoker .    02/09/2015  Research Visit-#4 Follow up Visit  IMPACT research study. A phase 3, 52 week, randomized double-blind,  3 -arm  parallel, group study, comparing the efficacy, safety and tolerability of the fixed dose triple combination FF/UMEC/VI with the fixed dose dual combination of FF/VI and UMEC/VI,  all administered once daily in the morning via dry powder inhaler in subjects with chronic obstructive pulmonary disease Has been having difficulty with COPD flare with cough, congestion . Seen 2 weeks ago, given Levaquin and pred pack .  CXR with no acute findings.  Complains of cough, congestion , wheezing , and dyspnea.  No hemoptyiss, chest pain, orthopnea or edema. No fever.  PFT today without sign change.     Review of Systems   Constitutional:   No  weight loss, night sweats,  Fevers, chills, f +fatigue, or  lassitude.  HEENT:   No headaches,  Difficulty swallowing,  Tooth/dental problems, or  Sore throat,                No sneezing, itching, ear ache, + nasal congestion, post nasal drip,   CV:  No chest pain,  Orthopnea, PND, swelling in lower extremities, anasarca, dizziness, palpitations, syncope.   GI  No heartburn, indigestion, abdominal pain, nausea, vomiting, diarrhea, change in bowel habits, loss of appetite, bloody stools.   Resp: .  No chest wall deformity  Skin: no rash or lesions.  GU: no dysuria, change in color of urine, no urgency or frequency.  No flank pain, no hematuria   MS:  No joint pain or swelling.  No decreased range of motion.  No back pain.  Psych:  No change in mood or affect. No depression or anxiety.  No memory loss.           Objective:   Physical Exam  GEN: A/Ox3; pleasant , NAD, chronically ill appearing   HEENT:  [place]/AT,  EACs-clear, TMs-wnl, NOSE-clear, THROAT-clear, no  lesions, no postnasal drip or exudate noted.  No oral candidiasis noted   NECK:  Supple w/ fair ROM; no JVD; normal carotid impulses w/o bruits; no thyromegaly or nodules palpated; no lymphadenopathy.  RESP  Faint exp wheezing, no stridor no accessory muscle use, no dullness to percussion  CARD:  RRR, no m/r/g  , no peripheral edema, pulses intact, no cyanosis or clubbing.  GI:   Soft & nt; nml bowel sounds; no organomegaly or masses detected.  Musco: Warm bil, no deformities or joint swelling noted.   Neuro: alert, no focal deficits noted.    Skin: Warm, no lesions or rashes

## 2015-02-09 NOTE — Assessment & Plan Note (Signed)
Cont w/ research protocol  

## 2015-02-09 NOTE — Progress Notes (Signed)
IMPACT Visit 4:  Patient returns, has all study drug accounted.  Subject is still complaining of shortness of breath and fatigue.  He did come to the office on 01/23/2015 and saw Dr. Chase Caller for an AE involving the same issue.  He was prescribed medication and has finished all.  Will have Rexene Edison, NP reassess the patient during visit today.  All procedures completed within protocol guidelines.

## 2015-02-15 ENCOUNTER — Telehealth: Payer: Self-pay | Admitting: Internal Medicine

## 2015-02-15 NOTE — Telephone Encounter (Addendum)
Spoke with pt- pt is upset because he received a bill for a copay for visit February 01, 2015 weith MR. Pt was advised that this OV (per patient) that he would not have a copay because he is research.  Pt wanting to know how to get this changed. Patient is research so should not be charged for any visits - pt has not had to pay for anything thus far since being in research program.   Pt was seen for ACUTE visit 02/01/2015 which was coded with Primary Dx of COPD and Secondary: Research Pt is very adamant stating that Dr Chase Caller told him that he would not get billed for that visit 02-01-2023.   Please advise Dr Chase Caller. Thanks.

## 2015-02-15 NOTE — Telephone Encounter (Signed)
Spoke to patient  Explained difference betwwen research visit and standard of care/routine care visit and who pays for what  He will have to pay for acute visit. He understands his obligation  CLosing encounter

## 2015-02-21 ENCOUNTER — Encounter: Payer: Self-pay | Admitting: Gastroenterology

## 2015-03-06 ENCOUNTER — Telehealth: Payer: Self-pay | Admitting: Internal Medicine

## 2015-03-06 MED ORDER — K PHOS MONO-SOD PHOS DI & MONO 155-852-130 MG PO TABS: 250.0000 mg | ORAL_TABLET | Freq: Three times a day (TID) | ORAL | Status: DC

## 2015-03-06 NOTE — Telephone Encounter (Signed)
Spoke with pt, he is aware of results and recs.  Sent neutraphos to pharmacy.  Nothing further needed.

## 2015-03-06 NOTE — Telephone Encounter (Signed)
Let patient know that research labs from 02/09/15 was reviewed by me and noticed that all ok byut phosphorus a bit low. Threfore he needs a medical itnervention. Can explain why he feels a bit "crap". Hve him take neutraphos 1 packet tid x 1 week  Sending to triage  Dr. Brand Males, M.D., [facility].C.P Pulmonary and Critical Care Medicine Staff Physician [place] [facility] Pager: [phone], If no answer or between  15:00h - 7:00h: call [phone]  03/06/2015 12:02 PM

## 2015-03-09 ENCOUNTER — Telehealth: Payer: Self-pay | Admitting: *Deleted

## 2015-03-09 NOTE — Telephone Encounter (Signed)
Dr. Fuller Plan,  This pt is coming in for a PV tomorrow for his colonoscopy on 03-17-15.  According to his chart, he does have severe COPD that he is in a clinical research study for.  Also, He has a history of throat cancer which he had laser surgery to treat.  His COPD would make him an ASA 4.  Would you like an OV with him or direct to hospital?  Thanks, Cyril Mourning

## 2015-03-09 NOTE — Telephone Encounter (Signed)
Yes office visit to further evaluate risk/benefit of surveillance colonoscopy. He does not appear to be a good candidate for an elective surveillance colonoscopy.

## 2015-03-17 ENCOUNTER — Encounter: Payer: 59 | Admitting: Gastroenterology

## 2015-04-28 ENCOUNTER — Ambulatory Visit (INDEPENDENT_AMBULATORY_CARE_PROVIDER_SITE_OTHER): Payer: 59 | Admitting: Gastroenterology

## 2015-04-28 ENCOUNTER — Encounter: Payer: Self-pay | Admitting: Gastroenterology

## 2015-04-28 VITALS — BP 116/70 | HR 68 | Ht 70.0 in | Wt 205.1 lb

## 2015-04-28 DIAGNOSIS — J449 Chronic obstructive pulmonary disease, unspecified: Secondary | ICD-10-CM

## 2015-04-28 DIAGNOSIS — Z86010 Personal history of colonic polyps: Secondary | ICD-10-CM

## 2015-04-28 MED ORDER — NA SULFATE-K SULFATE-MG SULF 17.5-3.13-1.6 GM/177ML PO SOLN: 1.0000 | Freq: Once | ORAL | Status: DC

## 2015-04-28 NOTE — Patient Instructions (Signed)
You have been scheduled for a colonoscopy. Please follow written instructions given to you at your visit today.  Please pick up your prep supplies at the pharmacy within the next 1-3 days. If you use inhalers (even only as needed), please bring them with you on the day of your procedure. Your physician has requested that you go to www.startemmi.com and enter the access code given to you at your visit today. This web site gives a general overview about your procedure. However, you should still follow specific instructions given to you by our office regarding your preparation for the procedure.  Patient advised to avoid spicy, acidic, citrus, chocolate, mints, fruit and fruit juices.  Limit the intake of caffeine, alcohol and Soda.  Don't exercise too soon after eating.  Don't lie down within 3-4 hours of eating.  Elevate the head of your bed.  Fill your prescription for omeprazole given to your by Dr. Joylene Draft and take it daily.   Thank you for choosing me and [facility].  Pricilla Riffle. Dagoberto Ligas., MD., [facility]  cc: [name], MD

## 2015-04-28 NOTE — Progress Notes (Signed)
    History of Present Illness: This is a 59 year old male with a history of adenomatous colon polyps. He has COPD and a history of lung and throat cancer both in remission. No recent COPD flares. He does not need home oxygen. He has frequent GERD symptoms and is currently out of omeprazole. Denies weight loss, abdominal pain, constipation, diarrhea, change in stool caliber, melena, hematochezia, nausea, vomiting, dysphagia, chest pain.  Review of Systems: Pertinent positive and negative review of systems were noted in the above HPI section. All other review of systems were otherwise negative.  Current Medications, Allergies, Past Medical History, Past Surgical History, Family History and Social History were reviewed in [employer] record.  Physical Exam: General: Well developed, well nourished, no acute distress Head: Normocephalic and atraumatic Eyes:  sclerae anicteric, EOMI Ears: Normal auditory acuity Mouth: No deformity or lesions Neck: Supple, no masses or thyromegaly Lungs: Clear throughout to auscultation, brief scattered exp wheeze on right Heart: Regular rate and rhythm; no murmurs, rubs or bruits Abdomen: Soft, non tender and non distended. No masses, hepatosplenomegaly or hernias noted. Normal Bowel sounds Rectal: deferred to colonoscopy Musculoskeletal: Symmetrical with no gross deformities  Skin: No lesions on visible extremities Pulses:  Normal pulses noted Extremities: No clubbing, cyanosis, edema or deformities noted Neurological: Alert oriented x 4, grossly nonfocal Cervical Nodes:  No significant cervical adenopathy Inguinal Nodes: No significant inguinal adenopathy Psychological:  Alert and cooperative. Normal mood and affect  Assessment and Recommendations:  1. Personal history of adenomatous colon polyps. The risks (including bleeding, perforation, infection, missed lesions, medication reactions and possible hospitalization or surgery if  complications occur), benefits, and alternatives to colonoscopy with possible biopsy and possible polypectomy were discussed with the patient and they consent to proceed.   2. GERD. Follow all standard antireflux measures. Resume omeprazole 20 mg daily. If his symptoms are not well controlled consider increasing PPI dosage and consider EGD.  3. COPD. Currently controlled and stable.

## 2015-05-04 ENCOUNTER — Ambulatory Visit (AMBULATORY_SURGERY_CENTER): Payer: 59 | Admitting: Gastroenterology

## 2015-05-04 ENCOUNTER — Other Ambulatory Visit: Payer: Self-pay | Admitting: Gastroenterology

## 2015-05-04 ENCOUNTER — Encounter: Payer: Self-pay | Admitting: Gastroenterology

## 2015-05-04 VITALS — BP 141/80 | HR 64 | Temp 96.9°F | Resp 25 | Ht 70.0 in | Wt 205.0 lb

## 2015-05-04 DIAGNOSIS — D123 Benign neoplasm of transverse colon: Secondary | ICD-10-CM

## 2015-05-04 DIAGNOSIS — Z86010 Personal history of colonic polyps: Secondary | ICD-10-CM | POA: Diagnosis present

## 2015-05-04 DIAGNOSIS — D125 Benign neoplasm of sigmoid colon: Secondary | ICD-10-CM

## 2015-05-04 MED ORDER — SODIUM CHLORIDE 0.9 % IV SOLN: 500.0000 mL | INTRAVENOUS | Status: DC

## 2015-05-04 NOTE — Progress Notes (Signed)
Called to room to assist during endoscopic procedure.  Patient ID and intended procedure confirmed with present staff. Received instructions for my participation in the procedure from the performing physician.  

## 2015-05-04 NOTE — Op Note (Signed)
[place]  [address] [place], [address]   COLONOSCOPY PROCEDURE REPORT  PATIENT: Stephen [name], Stephen [name]  MR#: [id number] BIRTHDATE: 12-30-1956 , 58  yrs. old GENDER: male ENDOSCOPIST: Ladene Artist, MD, [facility] PROCEDURE DATE:  05/04/2015 PROCEDURE:   Colonoscopy, surveillance , Colonoscopy with biopsy, and Colonoscopy with snare polypectomy First Screening Colonoscopy - Avg.  risk and is 50 yrs.  old or older - No.  Prior Negative Screening - Now for repeat screening. N/A  History of Adenoma - Now for follow-up colonoscopy & has been > or = to 3 yrs.  Yes hx of adenoma.  Has been 3 or more years since last colonoscopy.  Polyps Removed Today ASA CLASS:   Class III INDICATIONS:Surveillance due to prior colonic neoplasia and PH Colon Adenoma. MEDICATIONS: Monitored anesthesia care and Propofol 300 mg IV DESCRIPTION OF PROCEDURE:   After the risks benefits and alternatives of the procedure were thoroughly explained, informed consent was obtained.  The digital rectal exam revealed no abnormalities of the rectum.   The LB [id number]  endoscope was introduced through the anus and advanced to the cecum, which was identified by both the appendix and ileocecal valve. No adverse events experienced.   The quality of the prep was good.  (Suprep was used)  The instrument was then slowly withdrawn as the colon was fully examined.  COLON FINDINGS: A sessile polyp measuring 5 mm in size was found in the transverse colon.  A polypectomy was performed with cold forceps.  The resection was complete, the polyp tissue was completely retrieved and sent to histology.   A sessile polyp measuring 7 mm in size was found in the sigmoid colon.  A polypectomy was performed with a cold snare.  The resection was complete, the polyp tissue was completely retrieved and sent to histology.   There was moderate diverticulosis noted in the descending colon and sigmoid colon with associated  tortuosity, colonic spasm, muscular hypertrophy and luminal narrowing.   There was mild diverticulosis noted in the transverse colon, ascending colon, and at the cecum.   The examination was otherwise normal. Retroflexed views revealed internal Grade I hemorrhoids. The time to cecum = 4.4 Withdrawal time = 9.2   The scope was withdrawn and the procedure completed. COMPLICATIONS: There were no immediate complications. ENDOSCOPIC IMPRESSION: 1.   Sessile polyp in the transverse colon; polypectomy performed with cold forceps 2.   Sessile polyp in the sigmoid colon; polypectomy performed with a cold snare 3.   Moderate diverticulosis in the descending colon and sigmoid colon 4.   Mild diverticulosis in the transverse colon, ascending colon, and at the cecum 5.   Grade l internal hemorrhoids RECOMMENDATIONS: 1.  Await pathology results 2.  High fiber diet with liberal fluid intake. 3.  Repeat Colonoscopy in 5 years.  eSigned:  Ladene Artist, MD, [facility] 05/04/2015 11:33 AM  cc: [name], MD

## 2015-05-04 NOTE — Progress Notes (Signed)
Report to PACU, RN, vss, BBS= Clear.  

## 2015-05-04 NOTE — Progress Notes (Signed)
Late entry: Before discharge, pt went in to the BR.  On coming out, he states, "I just had some blood in toilet."  He had flushed before I could observe the amt.  Dr. Fuller Plan came in to see him before DC.  His wife states he did have some bleeding this a.m. After drinking prep.  When pt used BR one more time before discharge, no blood in toilet noted

## 2015-05-04 NOTE — Patient Instructions (Signed)
YOU HAD AN ENDOSCOPIC PROCEDURE TODAY AT [facility]:   Refer to the procedure report that was given to you for any specific questions about what was found during the examination.  If the procedure report does not answer your questions, please call your gastroenterologist to clarify.  If you requested that your care partner not be given the details of your procedure findings, then the procedure report has been included in a sealed envelope for you to review at your convenience later.  YOU SHOULD EXPECT: Some feelings of bloating in the abdomen. Passage of more gas than usual.  Walking can help get rid of the air that was put into your GI tract during the procedure and reduce the bloating. If you had a lower endoscopy (such as a colonoscopy or flexible sigmoidoscopy) you may notice spotting of blood in your stool or on the toilet paper. If you underwent a bowel prep for your procedure, you may not have a normal bowel movement for a few days.  Please Note:  You might notice some irritation and congestion in your nose or some drainage.  This is from the oxygen used during your procedure.  There is no need for concern and it should clear up in a day or so.  SYMPTOMS TO REPORT IMMEDIATELY:   Following lower endoscopy (colonoscopy or flexible sigmoidoscopy):  Excessive amounts of blood in the stool  Significant tenderness or worsening of abdominal pains  Swelling of the abdomen that is new, acute  Fever of 100F or higher  For urgent or emergent issues, a gastroenterologist can be reached at any hour by calling [phone].  DIET: Your first meal following the procedure should be a small meal and then it is ok to progress to your normal diet. Heavy or fried foods are harder to digest and may make you feel nauseous or bloated.  Likewise, meals heavy in dairy and vegetables can increase bloating.  Drink plenty of fluids but you should avoid alcoholic beverages for 24 hours.  ACTIVITY:   You should plan to take it easy for the rest of today and you should NOT DRIVE or use heavy machinery until tomorrow (because of the sedation medicines used during the test).    FOLLOW UP: Our staff will call the number listed on your records the next business day following your procedure to check on you and address any questions or concerns that you may have regarding the information given to you following your procedure. If we do not reach you, we will leave a message.  However, if you are feeling well and you are not experiencing any problems, there is no need to return our call.  We will assume that you have returned to your regular daily activities without incident.  If any biopsies were taken you will be contacted by phone or by letter within the next 1-3 weeks.  Please call us at [phone] if you have not heard about the biopsies in 3 weeks.   SIGNATURES/CONFIDENTIALITY: You and/or your care partner have signed paperwork which will be entered into your electronic medical record.  These signatures attest to the fact that that the information above on your After Visit Summary has been reviewed and is understood.  Full responsibility of the confidentiality of this discharge information lies with you and/or your care-partner.  Continue your normal medications  Please read over handouts about polyps, diverticulosis, hemorrhoids, and high fiber diets  Push fluids

## 2015-05-05 ENCOUNTER — Telehealth: Payer: Self-pay | Admitting: *Deleted

## 2015-05-05 NOTE — Telephone Encounter (Signed)
  Follow up Call-  Call back number 05/04/2015 05/04/2015  Post procedure Call Back phone  # [phone] -  Permission to leave phone message Yes No  comments yes -     Patient questions:  Do you have a fever, pain , or abdominal swelling? No. Pain Score  0 *  Have you tolerated food without any problems? Yes.    Have you been able to return to your normal activities? Yes.    Do you have any questions about your discharge instructions: Diet   No. Medications  No. Follow up visit  No.  Do you have questions or concerns about your Care? No.  Actions: * If pain score is 4 or above: No action needed, pain <4.

## 2015-05-11 ENCOUNTER — Encounter: Payer: Self-pay | Admitting: Gastroenterology

## 2015-05-17 ENCOUNTER — Other Ambulatory Visit: Payer: Self-pay | Admitting: Internal Medicine

## 2015-05-17 DIAGNOSIS — R0600 Dyspnea, unspecified: Secondary | ICD-10-CM

## 2015-05-18 ENCOUNTER — Ambulatory Visit (INDEPENDENT_AMBULATORY_CARE_PROVIDER_SITE_OTHER): Payer: 59 | Admitting: Internal Medicine

## 2015-05-18 ENCOUNTER — Other Ambulatory Visit: Payer: 59

## 2015-05-18 ENCOUNTER — Ambulatory Visit (INDEPENDENT_AMBULATORY_CARE_PROVIDER_SITE_OTHER): Payer: 59 | Admitting: Adult Health

## 2015-05-18 VITALS — BP 132/76 | HR 71 | Temp 97.7°F | Resp 17 | Wt 207.0 lb

## 2015-05-18 DIAGNOSIS — Z006 Encounter for examination for normal comparison and control in clinical research program: Secondary | ICD-10-CM

## 2015-05-18 DIAGNOSIS — R0600 Dyspnea, unspecified: Secondary | ICD-10-CM | POA: Diagnosis not present

## 2015-05-18 DIAGNOSIS — J449 Chronic obstructive pulmonary disease, unspecified: Secondary | ICD-10-CM

## 2015-05-18 LAB — PULMONARY FUNCTION TEST
FEF 25-75 POST: 0.86 L/s
FEF 25-75 Pre: 0.75 L/sec
FEF2575-%Change-Post: 14 %
FEF2575-%Pred-Post: 28 %
FEF2575-%Pred-Pre: 24 %
FEV1-%Change-Post: 2 %
FEV1-%Pred-Post: 50 %
FEV1-%Pred-Pre: 48 %
FEV1-PRE: 1.78 L
FEV1-Post: 1.83 L
FEV1FVC-%CHANGE-POST: 2 %
FEV1FVC-%Pred-Pre: 69 %
FEV6-%Change-Post: 2 %
FEV6-%Pred-Post: 71 %
FEV6-%Pred-Pre: 70 %
FEV6-PRE: 3.23 L
FEV6-Post: 3.3 L
FEV6FVC-%Change-Post: 1 %
FEV6FVC-%Pred-Post: 103 %
FEV6FVC-%Pred-Pre: 101 %
FVC-%Change-Post: 0 %
FVC-%PRED-POST: 70 %
FVC-%PRED-PRE: 69 %
FVC-POST: 3.37 L
FVC-PRE: 3.36 L
POST FEV6/FVC RATIO: 98 %
PRE FEV6/FVC RATIO: 96 %
Post FEV1/FVC ratio: 54 %
Pre FEV1/FVC ratio: 53 %

## 2015-05-18 NOTE — Assessment & Plan Note (Addendum)
Doing well in study   Plan  Cont research protocol  Follow up as planned

## 2015-05-18 NOTE — Progress Notes (Signed)
Spirometry pre and post done today. 

## 2015-05-18 NOTE — Progress Notes (Signed)
[id number]: [employer] IMPACT research study: blinded >/= 52 week randomized trial of ANORO equivalent v BREO equivalent v GSK triple MDI (ICS + LABA + LAMA). Sponsored by [employer]   This visit 05/18/2015 for [place] with Aug 17, 1956 who is subject number [id number] is a research Visit and is for purpose of follow up and is number 5 on PROTOCOL.   Subject doing well, no new complaints or med changes.  ECG was not completed due to malfunction, ERT notified and monitor.

## 2015-05-18 NOTE — Progress Notes (Signed)
[id number]: [employer] IMPACT research study: blinded >/= 52 week randomized trial of ANORO equivalent v BREO equivalent v GSK triple MDI (ICS + LABA + LAMA). Sponsored by [place]   This visit 05/18/2015 for [place] with 07/05/1956 who is subject number [id number] is a  Research Visit and is for purpose of follow up and is number 5 on PROTOCOL.  Subject doing well, no AE's or changes to Conmeds at this time.  ECG was not completed due to machine malfunction.  ERT has been notified along with monitor who is on site.

## 2015-05-18 NOTE — Assessment & Plan Note (Signed)
Compensated on present regimen.   

## 2015-05-18 NOTE — Progress Notes (Signed)
   Subjective:    Patient ID: Stephen [name], male    DOB: 07-16-1956, 59 y.o.   MRN: [id number]  HPI  81 with  Gold 3 COPD -MZ phenotype . Former smoker .    05/18/2015  Research Visit-#5 Follow up Visit  IMPACT research study. A phase 3, 52 week, randomized double-blind,  3 -arm  parallel, group study, comparing the efficacy, safety and tolerability of the fixed dose triple combination FF/UMEC/VI with the fixed dose dual combination of FF/VI and UMEC/VI,  all administered once daily in the morning via dry powder inhaler in subjects with chronic obstructive pulmonary disease  Subject doing well, no new complaints or med changes. No flare of cough , dyspnea or wheezing   Review of Systems see hpi       Objective:   Physical Exam  GEN: A/Ox3; pleasant , NAD, chronically ill appearing   HEENT:  [place]/AT,  EACs-clear, TMs-wnl, NOSE-clear, THROAT-clear, no lesions, no postnasal drip or exudate noted.  No oral candidiasis noted   NECK:  Supple w/ fair ROM; no JVD; normal carotid impulses w/o bruits; no thyromegaly or nodules palpated; no lymphadenopathy.  RESP  CTA no accessory muscle use, no dullness to percussion  CARD:  RRR, no m/r/g  , no peripheral edema, pulses intact, no cyanosis or clubbing.  GI:   Soft & nt; nml bowel sounds; no organomegaly or masses detected.  Musco: Warm bil, no deformities or joint swelling noted.   Neuro: alert, no focal deficits noted.    Skin: Warm, no lesions or rashes

## 2015-05-18 NOTE — Patient Instructions (Signed)
Cont with research protocol

## 2015-07-26 ENCOUNTER — Other Ambulatory Visit: Payer: Self-pay | Admitting: Internal Medicine

## 2015-07-26 DIAGNOSIS — R0600 Dyspnea, unspecified: Secondary | ICD-10-CM

## 2015-07-27 ENCOUNTER — Ambulatory Visit (INDEPENDENT_AMBULATORY_CARE_PROVIDER_SITE_OTHER): Payer: Commercial Managed Care - HMO | Admitting: Internal Medicine

## 2015-07-27 VITALS — BP 120/86 | HR 60 | Temp 97.5°F | Resp 16 | Wt 210.8 lb

## 2015-07-27 DIAGNOSIS — Z87898 Personal history of other specified conditions: Secondary | ICD-10-CM

## 2015-07-27 DIAGNOSIS — J449 Chronic obstructive pulmonary disease, unspecified: Secondary | ICD-10-CM

## 2015-07-27 DIAGNOSIS — R0600 Dyspnea, unspecified: Secondary | ICD-10-CM

## 2015-07-27 DIAGNOSIS — Z006 Encounter for examination for normal comparison and control in clinical research program: Secondary | ICD-10-CM

## 2015-07-27 LAB — PULMONARY FUNCTION TEST
FEF 25-75 Post: 0.84 L/sec
FEF 25-75 Pre: 0.71 L/sec
FEF2575-%Change-Post: 18 %
FEF2575-%Pred-Post: 27 %
FEF2575-%Pred-Pre: 23 %
FEV1-%Change-Post: 5 %
FEV1-%Pred-Post: 49 %
FEV1-%Pred-Pre: 46 %
FEV1-Post: 1.8 L
FEV1-Pre: 1.7 L
FEV1FVC-%Change-Post: -2 %
FEV1FVC-%Pred-Pre: 68 %
FEV6-%Change-Post: 4 %
FEV6-%Pred-Post: 71 %
FEV6-%Pred-Pre: 68 %
FEV6-Post: 3.3 L
FEV6-Pre: 3.15 L
FEV6FVC-%Change-Post: -3 %
FEV6FVC-%Pred-Post: 97 %
FEV6FVC-%Pred-Pre: 101 %
FVC-%Change-Post: 8 %
FVC-%Pred-Post: 73 %
FVC-%Pred-Pre: 68 %
FVC-Post: 3.55 L
FVC-Pre: 3.28 L
Post FEV1/FVC ratio: 51 %
Post FEV6/FVC ratio: 93 %
Pre FEV1/FVC ratio: 52 %
Pre FEV6/FVC Ratio: 96 %

## 2015-07-27 NOTE — Patient Instructions (Signed)
  1. Research study patient 2. Chronic obstructive pulmonary disease, unspecified COPD, unspecified chronic bronchitis type   - continue research protocol - aim for 100% compliance  - flu shot in fall per std of care  3. History of elevated PSA - sounds like elevation to 4 range is new - we will ascertain if this baseline history or an "Adverse Event" for study purpose. If Adverse Event not related to study drug  FOllowup  - per study protocol

## 2015-07-27 NOTE — Progress Notes (Signed)
Spirometry before and after done today. 

## 2015-07-27 NOTE — Progress Notes (Signed)
[id number]: GSK IMPACT research study: blinded >/= 52 week randomized trial of ANORO equivalent v BREO equivalent v GSK triple MDI (ICS + LABA + LAMA). Sponsored by [employer]    S:  This visit 07/27/2015 for Stephen [name] with 05-Nov-1956 who is subject number [id number] is a research Visit and is for purpose of follow up and is number 6 on PROTOCOL.  Subject is doing well, he has no new complaints or AE's.  He has not had any exacerbations in the last 3 months.  All study drug was collected, subject given 3 months more of study drug.  He will return in Oct 2016 for his final visit.  Instructed to bring all study drug and diary with him to next visit.   88% compliant. Missed 8 doses in 90 days; doe snot know how. Final visit in Oct 2016. THis visit for mouth check to rule out thrush. He tells me though that he had a physical exam with primary care physician yesterday and this prostate-specific antigen was elevated in the 4 range. He says he not sure if he has had elevated PSA in the past. He says that he might have known about it. At the same time he also tells me couple of years ago it was in the 1-2 range and this is the highest it is pain. Primary care physician is going to monitor this and recheck his PSA in 4 months   O: Filed Vitals:   07/27/15 0955  BP: 120/86  Pulse: 60  Temp: 97.5 F (36.4 C)  TempSrc: Oral  Resp: 16  Weight: 210 lb 12.8 oz (95.618 kg)  SpO2: 97%     Significant exam: No oral thrush. No wheeze. He looks well he did nonfocal exam    ICD-9-CM ICD-10-CM   1. Research study patient V70.7 Z00.6   2. Chronic obstructive pulmonary disease, unspecified COPD, unspecified chronic bronchitis type 496 J44.9   3. History of elevated PSA V13.89 Z87.898     1. Research study patient 2. Chronic obstructive pulmonary disease, unspecified COPD, unspecified chronic bronchitis type   - continue research protocol - aim for 100% compliance  - flu shot in fall per std of care  3.  History of elevated PSA - sounds like elevation to 4 range is new - we will ascertain if this baseline history or an "Adverse Event" for study purpose. If Adverse Event not related to study drug  FO[place]up  - per study protocol    Dr. Brand Males, M.D., [facility].C.P Pulmonary and Critical Care Medicine Staff Physician [facility] Pager: [phone], If no answer or between  15:00h - 7:00h: call [phone]  07/27/2015 10:48 AM

## 2015-09-08 ENCOUNTER — Telehealth: Payer: Self-pay | Admitting: Medical Oncology

## 2015-09-08 NOTE — Telephone Encounter (Signed)
Asking if Stephen [name] will order CT abdomen chest Ct . Pt is worried. For several months he is having stomach ache , " sore and tender when you poke on it". Has vomited 3 times this month   "gives and takes' depending on what I eat like [place] food. I don't feel real bad" He denies wight loss, I have gained weight. He stated he does not take his prilosec daily as ordered. I recommended he take it everyday. Note to [place].

## 2015-09-08 NOTE — Telephone Encounter (Addendum)
Per Dr Julien Nordmann -he is only ordering CT chest-pt notified.

## 2015-09-15 ENCOUNTER — Ambulatory Visit (HOSPITAL_COMMUNITY)
Admission: RE | Admit: 2015-09-15 | Discharge: 2015-09-15 | Disposition: A | Discharge status: Home / Self Care | Payer: Commercial Managed Care - HMO | Source: Ambulatory Visit | Attending: Internal Medicine | Admitting: Internal Medicine

## 2015-09-15 ENCOUNTER — Other Ambulatory Visit (HOSPITAL_BASED_OUTPATIENT_CLINIC_OR_DEPARTMENT_OTHER): Payer: Commercial Managed Care - HMO

## 2015-09-15 DIAGNOSIS — C3490 Malignant neoplasm of unspecified part of unspecified bronchus or lung: Secondary | ICD-10-CM | POA: Insufficient documentation

## 2015-09-15 DIAGNOSIS — Z85118 Personal history of other malignant neoplasm of bronchus and lung: Secondary | ICD-10-CM | POA: Diagnosis not present

## 2015-09-15 DIAGNOSIS — K8020 Calculus of gallbladder without cholecystitis without obstruction: Secondary | ICD-10-CM | POA: Diagnosis not present

## 2015-09-15 LAB — COMPREHENSIVE METABOLIC PANEL (CC13)
ALK PHOS: 96 U/L (ref 40–150)
ALT: 23 U/L (ref 0–55)
AST: 24 U/L (ref 5–34)
Albumin: 4.1 g/dL (ref 3.5–5.0)
Anion Gap: 8 mEq/L (ref 3–11)
BILIRUBIN TOTAL: 0.62 mg/dL (ref 0.20–1.20)
BUN: 15.9 mg/dL (ref 7.0–26.0)
CALCIUM: 9.1 mg/dL (ref 8.4–10.4)
CO2: 27 meq/L (ref 22–29)
Chloride: 107 mEq/L (ref 98–109)
Creatinine: 0.9 mg/dL (ref 0.7–1.3)
Glucose: 98 mg/dl (ref 70–140)
Potassium: 3.7 mEq/L (ref 3.5–5.1)
Sodium: 143 mEq/L (ref 136–145)
TOTAL PROTEIN: 7.2 g/dL (ref 6.4–8.3)

## 2015-09-15 LAB — CBC WITH DIFFERENTIAL/PLATELET
BASO%: 1.8 % (ref 0.0–2.0)
Basophils Absolute: 0.1 10*3/uL (ref 0.0–0.1)
EOS%: 4.2 % (ref 0.0–7.0)
Eosinophils Absolute: 0.2 10*3/uL (ref 0.0–0.5)
HEMATOCRIT: 43 % (ref 38.4–49.9)
HGB: 14.5 g/dL (ref 13.0–17.1)
LYMPH#: 0.8 10*3/uL — AB (ref 0.9–3.3)
LYMPH%: 15 % (ref 14.0–49.0)
MCH: 28.9 pg (ref 27.2–33.4)
MCHC: 33.7 g/dL (ref 32.0–36.0)
MCV: 85.7 fL (ref 79.3–98.0)
MONO#: 0.5 10*3/uL (ref 0.1–0.9)
MONO%: 9.6 % (ref 0.0–14.0)
NEUT#: 3.9 10*3/uL (ref 1.5–6.5)
NEUT%: 69.4 % (ref 39.0–75.0)
Platelets: 153 10*3/uL (ref 140–400)
RBC: 5.02 10*6/uL (ref 4.20–5.82)
RDW: 13.7 % (ref 11.0–14.6)
WBC: 5.6 10*3/uL (ref 4.0–10.3)

## 2015-09-15 MED ORDER — IOHEXOL 300 MG/ML  SOLN
75.0000 mL | Freq: Once | INTRAMUSCULAR | Status: AC | PRN
  Administered 2015-09-15: 75 mL via INTRAVENOUS

## 2015-09-21 ENCOUNTER — Ambulatory Visit (HOSPITAL_BASED_OUTPATIENT_CLINIC_OR_DEPARTMENT_OTHER): Payer: Commercial Managed Care - HMO | Admitting: Internal Medicine

## 2015-09-21 ENCOUNTER — Encounter: Payer: Self-pay | Admitting: Internal Medicine

## 2015-09-21 ENCOUNTER — Telehealth: Payer: Self-pay | Admitting: Internal Medicine

## 2015-09-21 VITALS — BP 159/80 | HR 69 | Temp 97.7°F | Resp 18 | Ht 70.0 in | Wt 207.6 lb

## 2015-09-21 DIAGNOSIS — C3491 Malignant neoplasm of unspecified part of right bronchus or lung: Secondary | ICD-10-CM | POA: Diagnosis not present

## 2015-09-21 NOTE — Progress Notes (Signed)
[place] [phone]   [phone]  OFFICE PROGRESS NOTE  Jerlyn Ly, MD [place] [address]  PRINCIPAL DIAGNOSES:  1. Recurrent non-small cell lung cancer, presented with endobronchial lesion involving the right main stem bronchus diagnosed in March 2007.  2. History of stage IB non-small cell lung cancer diagnosed in May 2006.  3. 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  PRIOR THERAPY:  1. Status post right upper lobectomy on May 01, 2005 under the care of Dr. Arlyce Dice.  2. Status post laryngoscopy with CO2 laser excision of the right vocal cord lesion under the care of Dr. Redmond Baseman in March 2007.  3. Status post concurrent chemoradiation with weekly carboplatin and paclitaxel. Last dose was given May 12, 2006.  4. Status post 3 cycles of consolidation chemotherapy with docetaxel. Last dose was given August 21, 2006.  CURRENT THERAPY: Observation.  INTERVAL HISTORY: Stephen [name] 59 y.o. male returns to the clinic today for routine annual follow up visit. He has been observation since August 2007 The patient is feeling fine today with no specific complaints. He denied having any significant chest pain, shortness breath, cough or hemoptysis. The patient denied having any significant weight loss or night sweats. He has no nausea or vomiting. He had repeat CT scan of the chest performed recently and he is here for evaluation and discussion of his scan results.  MEDICAL HISTORY: Past Medical History  Diagnosis Date  . Allergic rhinitis, cause unspecified   . Other emphysema     copd  . Unspecified vitamin D deficiency   . Dyspnea   . Osteoporosis   . Thrombosed hemorrhoids   . Blindness of left eye   . Hypothyroidism   . Lung cancer     lung ca dx 06  . Throat cancer     throat ca dx 2007  . Tubular adenoma of colon 2009    ALLERGIES:  is allergic to codeine; cymbalta;  and montelukast sodium.  MEDICATIONS:  Current Outpatient Prescriptions  Medication Sig Dispense Refill  . albuterol (PROVENTIL) (2.5 MG/3ML) 0.083% nebulizer solution Take 2.5 mg by nebulization every 6 (six) hours as needed.      Marland Kitchen alendronate (FOSAMAX) 70 MG tablet Take 70 mg by mouth every 7 (seven) days. Take with a full glass of water on an empty stomach. On Mondays    . aspirin 81 MG tablet Take 81 mg by mouth daily.      Marland Kitchen levothyroxine (SYNTHROID, LEVOTHROID) 125 MCG tablet Take 67.5 mcg by mouth daily before breakfast.     . nitroGLYCERIN (NITROSTAT) 0.4 MG SL tablet Place 1 tablet (0.4 mg total) under the tongue every 5 (five) minutes as needed for chest pain. (Patient not taking: Reported on 05/04/2015) 20 tablet 0  . NON FORMULARY Research drug    . omeprazole (PRILOSEC) 20 MG capsule Take 1 capsule (20 mg total) by mouth daily. 30 capsule 11  . phosphorus (PHOSPHA 250 NEUTRAL) 155-852-130 MG tablet Take 1 tablet (250 mg total) by mouth 3 (three) times daily. (Patient not taking: Reported on 04/28/2015) 21 tablet 0  . pravastatin (PRAVACHOL) 40 MG tablet Take 40 mg by mouth every evening.     . temazepam (RESTORIL) 30 MG capsule Take 30 mg by mouth at bedtime as needed for sleep.    . Vitamin D, Ergocalciferol, (DRISDOL) 50000 UNITS CAPS Take 1 tablet by mouth Once  every 2 weeks.     No current facility-administered medications for this visit.    SURGICAL HISTORY:  Past Surgical History  Procedure Laterality Date  . Appendectomy    . Lung lobectomy      RUL  . Rotator cuff repair    . Knee surgery    . Shoulder surgery    . Throat surgery      Laser surgery for throat cancer  . Transthoracic echocardiogram  01/13/2012    EF=>55%; trace MR/TR;     REVIEW OF SYSTEMS:  A comprehensive review of systems was negative.   PHYSICAL EXAMINATION: General appearance: alert, cooperative and no distress Head: Normocephalic, without obvious abnormality, atraumatic Neck: no  adenopathy, no JVD and supple, symmetrical, trachea midline Lymph nodes: Cervical, supraclavicular, and axillary nodes normal. Resp: clear to auscultation bilaterally Cardio: regular rate and rhythm, S1, S2 normal, no murmur, click, rub or gallop GI: soft, non-tender; bowel sounds normal; no masses,  no organomegaly Extremities: extremities normal, atraumatic, no cyanosis or edema  ECOG PERFORMANCE STATUS: 0 - Asymptomatic  Blood pressure 159/80, pulse 69, temperature 97.7 F (36.5 C), temperature source Oral, resp. rate 18, height '5\' 10"'$  (1.778 m), weight 207 lb 9.6 oz (94.167 kg), SpO2 98 %.  LABORATORY DATA: Lab Results  Component Value Date   WBC 5.6 09/15/2015   HGB 14.5 09/15/2015   HCT 43.0 09/15/2015   MCV 85.7 09/15/2015   PLT 153 09/15/2015      Chemistry      Component Value Date/Time   NA 143 09/15/2015 0811   NA 141 08/08/2013 2123   NA 142 03/11/2012 1125   K 3.7 09/15/2015 0811   K 3.6 08/08/2013 2123   K 3.8 03/11/2012 1125   CL 104 08/08/2013 2123   CL 106 09/14/2012 0808   CL 98 03/11/2012 1125   CO2 27 09/15/2015 0811   CO2 26 08/08/2013 2123   CO2 30 03/11/2012 1125   BUN 15.9 09/15/2015 0811   BUN 17 08/08/2013 2123   BUN 18 03/11/2012 1125   CREATININE 0.9 09/15/2015 0811   CREATININE 0.80 08/08/2013 2123   CREATININE 1.0 03/11/2012 1125      Component Value Date/Time   CALCIUM 9.1 09/15/2015 0811   CALCIUM 9.3 08/08/2013 2123   CALCIUM 9.0 03/11/2012 1125   ALKPHOS 96 09/15/2015 0811   ALKPHOS 95 08/08/2013 2123   ALKPHOS 98* 03/11/2012 1125   AST 24 09/15/2015 0811   AST 29 08/08/2013 2123   AST 26 03/11/2012 1125   ALT 23 09/15/2015 0811   ALT 25 08/08/2013 2123   ALT 35 03/11/2012 1125   BILITOT 0.62 09/15/2015 0811   BILITOT 0.5 08/08/2013 2123   BILITOT 0.80 03/11/2012 1125       RADIOGRAPHIC STUDIES: Ct Chest W Contrast  09/15/2015   CLINICAL DATA:  Lung cancer diagnosed in 2006 with chemotherapy and radiation therapy.  Head neck cancer in 2007 with surgery only. No current problems. Right upper lobectomy.  EXAM: CT CHEST WITH CONTRAST  TECHNIQUE: Multidetector CT imaging of the chest was performed during intravenous contrast administration.  CONTRAST:  62m OMNIPAQUE IOHEXOL 300 MG/ML  SOLN  COMPARISON:  Plain film 01/23/2015.  Most recent CT of 09/15/2015.  FINDINGS: Mediastinum/Nodes: No supraclavicular adenopathy. Tortuous descending thoracic aorta. Normal heart size, without pericardial effusion. No central pulmonary embolism, on this non-dedicated study. 7 mm right paratracheal node is unchanged, including on image 12. No mediastinal or hilar adenopathy.  Lungs/Pleura: Chronic right-sided pleural  thickening. No pleural fluid. Status post right upper lobectomy.  Anterior right lower lobe 5 mm nodule on image 25 is unchanged. Clear left lung. Centrilobular and paraseptal emphysema.  Similar configuration of right apical consolidation, especially posteriorly. No well-defined recurrent mass.  Upper abdomen: Normal imaged portions of the liver, spleen, stomach, pancreas, adrenal glands, kidneys. Cholelithiasis without acute cholecystitis or biliary duct dilatation.  Musculoskeletal: Cortical thickening of high right-sided ribs is similar and could be radiation induced or postsurgical. Sub cm lucent lesion within the anterior aspect of the L1 vertebral body is eccentric right and similar back to 09/14/2013. Likely a small hemangioma.  IMPRESSION: 1. Similar postsurgical and treatment changes in the right hemi thorax, without evidence of recurrent or metastatic disease. 2. Similar right pleural thickening with apical consolidation. 3. Cholelithiasis.   Electronically Signed   By: Abigail Miyamoto M.D.   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 He is status post right  upper lobectomy as well as course of concurrent chemoradiation followed by consolidation chemotherapy and has been observation since August of 2007 with no evidence for disease recurrence. The patient has no complaints today. I discussed the scan results with the patient today. I recommended for him to continue on observation with repeat CT scan of the chest in one year. He was advised to call immediately if he has any concerning symptoms in the interval.  The patient voices understanding of current disease status and treatment options and is in agreement with the current care plan.  All questions were answered. The patient knows to call the clinic with any problems, questions or concerns. We can certainly see the patient much sooner if necessary.  Disclaimer: This note was dictated with voice recognition software. Similar sounding words can inadvertently be transcribed and may be missed upon review.

## 2015-09-21 NOTE — Telephone Encounter (Signed)
per pof to sch pt appt-gave pt copy of avs °

## 2015-10-26 ENCOUNTER — Other Ambulatory Visit: Payer: Commercial Managed Care - HMO

## 2015-10-26 ENCOUNTER — Other Ambulatory Visit: Payer: Self-pay | Admitting: Internal Medicine

## 2015-10-26 ENCOUNTER — Ambulatory Visit (INDEPENDENT_AMBULATORY_CARE_PROVIDER_SITE_OTHER): Payer: Commercial Managed Care - HMO | Admitting: Internal Medicine

## 2015-10-26 VITALS — BP 128/80 | HR 61 | Temp 97.3°F | Resp 16 | Wt 206.6 lb

## 2015-10-26 DIAGNOSIS — R0600 Dyspnea, unspecified: Secondary | ICD-10-CM

## 2015-10-26 DIAGNOSIS — J449 Chronic obstructive pulmonary disease, unspecified: Secondary | ICD-10-CM

## 2015-10-26 DIAGNOSIS — Z006 Encounter for examination for normal comparison and control in clinical research program: Secondary | ICD-10-CM

## 2015-10-26 LAB — PULMONARY FUNCTION TEST
FEF 25-75 POST: 0.87 L/s
FEF 25-75 PRE: 0.75 L/s
FEF2575-%CHANGE-POST: 16 %
FEF2575-%PRED-POST: 28 %
FEF2575-%PRED-PRE: 24 %
FEV1-%CHANGE-POST: 6 %
FEV1-%PRED-POST: 49 %
FEV1-%Pred-Pre: 46 %
FEV1-PRE: 1.69 L
FEV1-Post: 1.81 L
FEV1FVC-%CHANGE-POST: 2 %
FEV1FVC-%PRED-PRE: 70 %
FEV6-%Change-Post: 4 %
FEV6-%Pred-Post: 70 %
FEV6-%Pred-Pre: 67 %
FEV6-PRE: 3.09 L
FEV6-Post: 3.22 L
FEV6FVC-%Change-Post: 0 %
FEV6FVC-%Pred-Post: 100 %
FEV6FVC-%Pred-Pre: 101 %
FVC-%Change-Post: 4 %
FVC-%PRED-PRE: 66 %
FVC-%Pred-Post: 69 %
FVC-POST: 3.32 L
FVC-PRE: 3.18 L
POST FEV1/FVC RATIO: 55 %
POST FEV6/FVC RATIO: 97 %
Pre FEV1/FVC ratio: 53 %
Pre FEV6/FVC Ratio: 97 %

## 2015-10-26 MED ORDER — TIOTROPIUM BROMIDE MONOHYDRATE 18 MCG IN CAPS: 18.0000 ug | ORAL_CAPSULE | Freq: Every day | RESPIRATORY_TRACT | Status: DC

## 2015-10-26 MED ORDER — BUDESONIDE-FORMOTEROL FUMARATE 160-4.5 MCG/ACT IN AERO: 2.0000 | INHALATION_SPRAY | Freq: Two times a day (BID) | RESPIRATORY_TRACT | Status: DC

## 2015-10-26 NOTE — Progress Notes (Signed)
[id number]: [employer] IMPACT research study: blinded >/= 52 week randomized trial of ANORO equivalent v BREO equivalent v GSK triple MDI (ICS + LABA + LAMA). Sponsored by [employer]  This visit 10/26/2015 for Stephen [name] with [birth year] who is subject number [id number] is a Research  Visit and is for purpose of end of treatment and is number7 on PROTOCOL. Subject continues to do well, he has returned all study drug as instructed and completed labs, PFT's and ECG's.  He will be contacted in 2 weeks for safety follow up. Subjective denies any complaints. No adverse events. No thrush. Overall doing well.     Immunization History  Administered Date(s) Administered  . Influenza Split 08/30/2013, 09/08/2014  . Influenza Whole 10/01/2010, 10/02/2011  . Pneumococcal Conjugate-13 09/08/2014  . Pneumococcal Polysaccharide-23 10/30/2009      OBJECTIVE Filed Vitals:   10/26/15 1247  BP: 128/80  Pulse: 61  Temp: 97.3 F (36.3 C)  TempSrc: Oral  Resp: 16  Weight: 206 lb 9.6 oz (93.713 kg)  SpO2: 98%   Physical exam is unchanged and baseline. No wheeze. Interestingly in the time he is been on the study have not heard him wheeze whenever I have met him but before the study he was always wheezing even at baseline. No evidence of thrush on exam  EKG today I personally reviewed the trace and visualized it looks normal and in sinus  Spirometry today post bronchodilator FEV1 1.8 L/49% and a ratio 55. This looks stable compared to May 2016 in July 2016     A/p   ICD-9-CM ICD-10-CM   1. Research study patient V70.7 Z00.6   2. 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 Brand Males, M.D., [facility].C.P Pulmonary and Critical Care Medicine Staff Physician [facility] Pager: [phone], If no answer or between  15:00h - 7:00h: call [phone]  10/26/2015 2:09 PM

## 2015-10-26 NOTE — Patient Instructions (Addendum)
ICD-9-CM ICD-10-CM   1. Research study patient V70.7 Z00.6   2. 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

## 2015-10-26 NOTE — Progress Notes (Signed)
Spirometry pre and post done today. 

## 2016-01-26 ENCOUNTER — Encounter: Payer: Self-pay | Admitting: Internal Medicine

## 2016-01-26 ENCOUNTER — Ambulatory Visit (INDEPENDENT_AMBULATORY_CARE_PROVIDER_SITE_OTHER): Payer: Commercial Managed Care - HMO | Admitting: Internal Medicine

## 2016-01-26 VITALS — BP 132/88 | HR 73 | Ht 70.0 in | Wt 211.0 lb

## 2016-01-26 DIAGNOSIS — J441 Chronic obstructive pulmonary disease with (acute) exacerbation: Secondary | ICD-10-CM

## 2016-01-26 DIAGNOSIS — J449 Chronic obstructive pulmonary disease, unspecified: Secondary | ICD-10-CM | POA: Diagnosis not present

## 2016-01-26 NOTE — Progress Notes (Signed)
Subjective:     Patient ID: Stephen [name], male   DOB: 1956-03-16, 60 y.o.   MRN: [id number]  HPI  OV 01/26/2016  Chief Complaint  Patient presents with  . Follow-up    Pt c/o cough with green mucus, wheeze and SOB. Pt was given Levaquin and oral pred taper by his PCP. Pt states this has improved his symptoms.    60 year old severe COPD patient. He used to be on research trial. This is standard of care COPD visit. Since October he has been doing well but last week he had a COPD exacerbation and was treated by primary care physician with Levaquin and 6 dayprednisone according to his history. He feels that the COPD in fact [facility] research study really helped him and he wants to be on the triple inhaler when he is approved. We have reminded him that it was a blinded study and we do not know which he took. He is currently doing well in terms of his COPD. It is stable. COPD cat score is listed below. He is up-to-date with his vaccines. He is compliant with Spiriva and Symbicort. There are no new issues.   CAT COPD Symptom & Quality of Life Score (GSK trademark) 0 is no burden. 5 is highest burden 01/26/2016   Never Cough -> Cough all the time 2  No phlegm in chest -> Chest is full of phlegm 2  No chest tightness -> Chest feels very tight 2  No dyspnea for 1 flight stairs/hill -> Very dyspneic for 1 flight of stairs 4  No limitations for ADL at home -> Very limited with ADL at home 4  Confident leaving home -> Not at all confident leaving home 0  Sleep soundly -> Do not sleep soundly because of lung condition 1  Lots of Energy -> No energy at all 3  TOTAL Score (max 40)  18       Current outpatient prescriptions:  .  albuterol (PROVENTIL) (2.5 MG/3ML) 0.083% nebulizer solution, Take 2.5 mg by nebulization every 6 (six) hours as needed.  , Disp: , Rfl:  .  aspirin 81 MG tablet, Take 81 mg by mouth daily.  , Disp: , Rfl:  .  budesonide-formoterol (SYMBICORT) 160-4.5 MCG/ACT  inhaler, Inhale 2 puffs into the lungs 2 (two) times daily., Disp: 1 Inhaler, Rfl: 6 .  escitalopram (LEXAPRO) 10 MG tablet, Take 10 mg by mouth daily., Disp: , Rfl: 9 .  levothyroxine (SYNTHROID, LEVOTHROID) 125 MCG tablet, Take 67.5 mcg by mouth daily before breakfast. , Disp: , Rfl:  .  nitroGLYCERIN (NITROSTAT) 0.4 MG SL tablet, Place 1 tablet (0.4 mg total) under the tongue every 5 (five) minutes as needed for chest pain., Disp: 20 tablet, Rfl: 0 .  omeprazole (PRILOSEC) 20 MG capsule, Take 1 capsule (20 mg total) by mouth daily., Disp: 30 capsule, Rfl: 11 .  pravastatin (PRAVACHOL) 40 MG tablet, Take 40 mg by mouth every evening. , Disp: , Rfl:  .  temazepam (RESTORIL) 30 MG capsule, Take 30 mg by mouth at bedtime as needed for sleep., Disp: , Rfl:  .  tiotropium (SPIRIVA) 18 MCG inhalation capsule, Place 1 capsule (18 mcg total) into inhaler and inhale daily., Disp: 30 capsule, Rfl: 6 .  Vitamin D, Ergocalciferol, (DRISDOL) 50000 UNITS CAPS, Take 1 tablet by mouth Once every 2 weeks., Disp: , Rfl:  .  phosphorus (PHOSPHA 250 NEUTRAL) 155-852-130 MG tablet, Take 1 tablet (250 mg total) by mouth 3 (three) times  daily. (Patient not taking: Reported on 01/26/2016), Disp: 21 tablet, Rfl: 0    Immunization History  Administered Date(s) Administered  . Influenza Split 08/30/2013, 09/08/2014, 09/06/2015  . Influenza Whole 10/01/2010, 10/02/2011  . Pneumococcal Conjugate-13 09/08/2014  . Pneumococcal Polysaccharide-23 10/30/2009    Allergies  Allergen Reactions  . Codeine Other (See Comments)    unknown  . Cymbalta [Duloxetine Hcl] Nausea And Vomiting  . Montelukast Sodium Other (See Comments)    Causes sinusitis     Review of Systems     Objective:   Physical Exam  Constitutional: He is oriented to person, place, and time. He appears well-developed and well-nourished. No distress.  HENT:  Head: Normocephalic and atraumatic.  Right Ear: External ear normal.  Left Ear: External  ear normal.  Mouth/Throat: Oropharynx is clear and moist. No oropharyngeal exudate.  Eyes: Conjunctivae and EOM are normal. Pupils are equal, round, and reactive to light. Right eye exhibits no discharge. Left eye exhibits no discharge. No scleral icterus.  Neck: Normal range of motion. Neck supple. No JVD present. No tracheal deviation present. No thyromegaly present.  Cardiovascular: Normal rate, regular rhythm and intact distal pulses.  Exam reveals no gallop and no friction rub.   No murmur heard. Pulmonary/Chest: Effort normal and breath sounds normal. No respiratory distress. He has no wheezes. He has no rales. He exhibits no tenderness.  Abdominal: Soft. Bowel sounds are normal. He exhibits no distension and no mass. There is no tenderness. There is no rebound and no guarding.  Visceral obesity  Musculoskeletal: Normal range of motion. He exhibits no edema or tenderness.  Lymphadenopathy:    He has no cervical adenopathy.  Neurological: He is alert and oriented to person, place, and time. He has normal reflexes. No cranial nerve deficit. Coordination normal.  Skin: Skin is warm and dry. No rash noted. He is not diaphoretic. No erythema. No pallor.  Psychiatric: He has a normal mood and affect. His behavior is normal. Judgment and thought content normal.  Nursing note and vitals reviewed.  Filed Vitals:   01/26/16 1028  BP: 132/88  Pulse: 73  Height: '5\' 10"'$  (1.778 m)  Weight: 211 lb (95.709 kg)  SpO2: 97%       Assessment:       ICD-9-CM ICD-10-CM   1. 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 COPD exacerbation ([place]) 491.21 J44.1         Plan:      COPD exacerbation appears resolved after recent Levaquin and prednisone Continue Spiriva and Symbicort Albuterol as needed Glad you are up-to-date with respiratory vaccines Appreciate your interest in other COPD research trials we will let you know when we have one  Follow-up 6 months or sooner if needed   Dr. Brand Males, M.D., [facility].C.P Pulmonary and Critical Care Medicine Staff Physician [facility] Pager: [phone], If no answer or between  15:00h - 7:00h: call [phone]  01/26/2016 10:51 AM

## 2016-01-26 NOTE — Patient Instructions (Addendum)
ICD-9-CM ICD-10-CM   1. 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 COPD exacerbation ([facility]) 491.21 J44.1    COPD exacerbation appears resolved after recent Levaquin and prednisone Continue Spiriva and Symbicort Albuterol as needed Glad you are up-to-date with respiratory vaccines Appreciate your interest in other COPD research trials we will let you know when we have one  Follow-up 6 months or sooner if needed

## 2016-03-20 ENCOUNTER — Emergency Department (HOSPITAL_COMMUNITY): Payer: Commercial Managed Care - HMO

## 2016-03-20 ENCOUNTER — Encounter (HOSPITAL_COMMUNITY): Payer: Self-pay | Admitting: *Deleted

## 2016-03-20 ENCOUNTER — Emergency Department (HOSPITAL_COMMUNITY)
Admission: EM | Admit: 2016-03-20 | Discharge: 2016-03-20 | Disposition: A | Discharge status: Home / Self Care | Payer: Commercial Managed Care - HMO | Attending: Emergency Medicine | Admitting: Emergency Medicine

## 2016-03-20 DIAGNOSIS — E559 Vitamin D deficiency, unspecified: Secondary | ICD-10-CM | POA: Insufficient documentation

## 2016-03-20 DIAGNOSIS — Z8739 Personal history of other diseases of the musculoskeletal system and connective tissue: Secondary | ICD-10-CM | POA: Diagnosis not present

## 2016-03-20 DIAGNOSIS — Z79899 Other long term (current) drug therapy: Secondary | ICD-10-CM | POA: Diagnosis not present

## 2016-03-20 DIAGNOSIS — H5442 Blindness, left eye, normal vision right eye: Secondary | ICD-10-CM | POA: Insufficient documentation

## 2016-03-20 DIAGNOSIS — Z8719 Personal history of other diseases of the digestive system: Secondary | ICD-10-CM | POA: Diagnosis not present

## 2016-03-20 DIAGNOSIS — Z7951 Long term (current) use of inhaled steroids: Secondary | ICD-10-CM | POA: Insufficient documentation

## 2016-03-20 DIAGNOSIS — J439 Emphysema, unspecified: Secondary | ICD-10-CM | POA: Insufficient documentation

## 2016-03-20 DIAGNOSIS — E039 Hypothyroidism, unspecified: Secondary | ICD-10-CM | POA: Diagnosis not present

## 2016-03-20 DIAGNOSIS — Z85118 Personal history of other malignant neoplasm of bronchus and lung: Secondary | ICD-10-CM | POA: Insufficient documentation

## 2016-03-20 DIAGNOSIS — Z85818 Personal history of malignant neoplasm of other sites of lip, oral cavity, and pharynx: Secondary | ICD-10-CM | POA: Diagnosis not present

## 2016-03-20 DIAGNOSIS — Z86018 Personal history of other benign neoplasm: Secondary | ICD-10-CM | POA: Diagnosis not present

## 2016-03-20 DIAGNOSIS — R0602 Shortness of breath: Secondary | ICD-10-CM | POA: Diagnosis present

## 2016-03-20 DIAGNOSIS — R000 Tachycardia, unspecified: Secondary | ICD-10-CM | POA: Insufficient documentation

## 2016-03-20 DIAGNOSIS — Z7982 Long term (current) use of aspirin: Secondary | ICD-10-CM | POA: Insufficient documentation

## 2016-03-20 DIAGNOSIS — Z87891 Personal history of nicotine dependence: Secondary | ICD-10-CM | POA: Diagnosis not present

## 2016-03-20 DIAGNOSIS — J441 Chronic obstructive pulmonary disease with (acute) exacerbation: Secondary | ICD-10-CM

## 2016-03-20 LAB — CBC WITH DIFFERENTIAL/PLATELET
Basophils Absolute: 0.1 10*3/uL (ref 0.0–0.1)
Basophils Relative: 1 %
EOS PCT: 0 %
Eosinophils Absolute: 0 10*3/uL (ref 0.0–0.7)
HEMATOCRIT: 44.9 % (ref 39.0–52.0)
Hemoglobin: 15.1 g/dL (ref 13.0–17.0)
LYMPHS ABS: 1.5 10*3/uL (ref 0.7–4.0)
LYMPHS PCT: 15 %
MCH: 28.5 pg (ref 26.0–34.0)
MCHC: 33.6 g/dL (ref 30.0–36.0)
MCV: 84.7 fL (ref 78.0–100.0)
MONO ABS: 1.5 10*3/uL — AB (ref 0.1–1.0)
Monocytes Relative: 14 %
NEUTROS ABS: 7.3 10*3/uL (ref 1.7–7.7)
Neutrophils Relative %: 70 %
Platelets: 179 10*3/uL (ref 150–400)
RBC: 5.3 MIL/uL (ref 4.22–5.81)
RDW: 12.8 % (ref 11.5–15.5)
WBC: 10.3 10*3/uL (ref 4.0–10.5)

## 2016-03-20 LAB — URINE MICROSCOPIC-ADD ON

## 2016-03-20 LAB — URINALYSIS, ROUTINE W REFLEX MICROSCOPIC
GLUCOSE, UA: NEGATIVE mg/dL
Ketones, ur: 15 mg/dL — AB
Leukocytes, UA: NEGATIVE
Nitrite: NEGATIVE
PH: 5.5 (ref 5.0–8.0)
Protein, ur: 100 mg/dL — AB
Specific Gravity, Urine: 1.028 (ref 1.005–1.030)

## 2016-03-20 LAB — I-STAT TROPONIN, ED: TROPONIN I, POC: 0.01 ng/mL (ref 0.00–0.08)

## 2016-03-20 LAB — COMPREHENSIVE METABOLIC PANEL
ALBUMIN: 4 g/dL (ref 3.5–5.0)
ALK PHOS: 83 U/L (ref 38–126)
ALT: 23 U/L (ref 17–63)
AST: 32 U/L (ref 15–41)
Anion gap: 12 (ref 5–15)
BUN: 14 mg/dL (ref 6–20)
CALCIUM: 8.8 mg/dL — AB (ref 8.9–10.3)
CO2: 24 mmol/L (ref 22–32)
CREATININE: 1.16 mg/dL (ref 0.61–1.24)
Chloride: 100 mmol/L — ABNORMAL LOW (ref 101–111)
GFR calc Af Amer: >60 mL/min (ref >60)
GFR calc non Af Amer: >60 mL/min (ref >60)
GLUCOSE: 117 mg/dL — AB (ref 65–99)
Potassium: 3.5 mmol/L (ref 3.5–5.1)
SODIUM: 136 mmol/L (ref 135–145)
Total Bilirubin: 1.3 mg/dL — ABNORMAL HIGH (ref 0.3–1.2)
Total Protein: 7.8 g/dL (ref 6.5–8.1)

## 2016-03-20 LAB — I-STAT CG4 LACTIC ACID, ED
LACTIC ACID, VENOUS: 1.05 mmol/L (ref 0.5–2.0)
Lactic Acid, Venous: 2.08 mmol/L (ref 0.5–2.0)

## 2016-03-20 MED ORDER — OSELTAMIVIR PHOSPHATE 75 MG PO CAPS
75.0000 mg | ORAL_CAPSULE | Freq: Once | ORAL | Status: AC
  Administered 2016-03-20: 75 mg via ORAL
  Filled 2016-03-20: qty 1

## 2016-03-20 MED ORDER — PREDNISONE 20 MG PO TABS: 40.0000 mg | ORAL_TABLET | Freq: Every day | ORAL | Status: DC

## 2016-03-20 MED ORDER — IPRATROPIUM-ALBUTEROL 0.5-2.5 (3) MG/3ML IN SOLN: 3.0000 mL | Freq: Once | RESPIRATORY_TRACT | Status: DC

## 2016-03-20 MED ORDER — SODIUM CHLORIDE 0.9 % IV BOLUS (SEPSIS)
1000.0000 mL | Freq: Once | INTRAVENOUS | Status: AC
  Administered 2016-03-20: 1000 mL via INTRAVENOUS

## 2016-03-20 MED ORDER — ALBUTEROL (5 MG/ML) CONTINUOUS INHALATION SOLN
10.0000 mg/h | INHALATION_SOLUTION | RESPIRATORY_TRACT | Status: DC
  Administered 2016-03-20: 10 mg/h via RESPIRATORY_TRACT
  Filled 2016-03-20: qty 20

## 2016-03-20 NOTE — ED Notes (Signed)
Pt reports that he started feeling bad two days ago, having sob, fever, bodyaches, cough. Went to pcp today and sent here due to resp distress and +flu. Mask on pt at triage. Received neb and solumedrol at dr office. spo2 92% at triage.

## 2016-03-20 NOTE — ED Notes (Signed)
Patient able to ambulate independently  

## 2016-03-20 NOTE — Discharge Instructions (Signed)

## 2016-03-20 NOTE — ED Notes (Signed)
MD at bedside reevaluating patient.

## 2016-03-20 NOTE — ED Provider Notes (Signed)
CSN: [id number]     Arrival date & time 03/20/16  1715 History   First MD Initiated Contact with Patient 03/20/16 1939     Chief Complaint  Patient presents with  . Shortness of Breath  . Influenza    HPI   Stephen [name] is an 60 y.o. male with history of COPD (does not take his meds regularly, no home O2), h/o lung and esophageal cancer in remission, who presents to the ED from his PCP office for evaluation of SOB/respiratory distress. He was seen at [facility] office earlier today for evaluation of cough more productive than baseline, increased SOB/DOE, fever, chills, body aches. He tested positive for flu at PCP office today. At his PCP's office he was given one albuterol neb and solu-medrol and sent to the ED for further evaluation. In the ED now pt does have some mildly increased WOB and pauses to take breaths mid-sentence at times. Pt states that at baseline he does have some SOB/DOE but this feels worse than baseline. Denies any chest pain. He states his "lungs feel tight." Denies any new edema. Denies h/o blood clots, recent travel.   Past Medical History  Diagnosis Date  . Allergic rhinitis, cause unspecified   . Other emphysema ([place])     copd  . Unspecified vitamin D deficiency   . Dyspnea   . Osteoporosis   . Thrombosed hemorrhoids   . Blindness of left eye   . Hypothyroidism   . Lung cancer ([place])     lung ca dx 06  . Throat cancer ([place])     throat ca dx 2007  . Tubular adenoma of colon 2009   Past Surgical History  Procedure Laterality Date  . Appendectomy    . Lung lobectomy      RUL  . Rotator cuff repair    . Knee surgery    . Shoulder surgery    . Throat surgery      Laser surgery for throat cancer  . Transthoracic echocardiogram  01/13/2012    EF=>55%; trace MR/TR;    Family History  Problem Relation Age of Onset  . Cancer Mother     Brain tumor  . COPD Mother   . Cystic fibrosis Sister     also heart failure  . Heart disease Father     CABG  .  Mental illness Brother   . Heart failure Paternal Grandmother   . Colon cancer Neg Hx   . Colon polyps Neg Hx   . Esophageal cancer Neg Hx   . Prostate cancer Father    Social History  Substance Use Topics  . Smoking status: Former Smoker -- 1.50 packs/day for 32 years    Types: Cigarettes    Quit date: 12/30/2004  . Smokeless tobacco: Never Used     Comment: 1 ppd x 30 years  . Alcohol Use: Yes     Comment: rare    Review of Systems  All other systems reviewed and are negative.     Allergies  Codeine; Cymbalta; and Montelukast sodium  Home Medications   Prior to Admission medications   Medication Sig Start Date End Date Taking? Authorizing Provider  albuterol (PROVENTIL) (2.5 MG/3ML) 0.083% nebulizer solution Take 2.5 mg by nebulization every 6 (six) hours as needed.      Historical Provider, MD  aspirin 81 MG tablet Take 81 mg by mouth daily.      Historical Provider, MD  budesonide-formoterol (SYMBICORT) 160-4.5 MCG/ACT inhaler Inhale 2  puffs into the lungs 2 (two) times daily. 10/26/15   Brand Males, MD  escitalopram (LEXAPRO) 10 MG tablet Take 10 mg by mouth daily. 09/17/15   Historical Provider, MD  levothyroxine (SYNTHROID, LEVOTHROID) 125 MCG tablet Take 67.5 mcg by mouth daily before breakfast.     Historical Provider, MD  nitroGLYCERIN (NITROSTAT) 0.4 MG SL tablet Place 1 tablet (0.4 mg total) under the tongue every 5 (five) minutes as needed for chest pain. 08/09/13   Delfina Redwood, MD  omeprazole (PRILOSEC) 20 MG capsule Take 1 capsule (20 mg total) by mouth daily. 03/11/14   Melvenia Needles, NP  phosphorus (PHOSPHA 250 NEUTRAL) [phone] MG tablet Take 1 tablet (250 mg total) by mouth 3 (three) times daily. Patient not taking: Reported on 01/26/2016 03/06/15   Brand Males, MD  pravastatin (PRAVACHOL) 40 MG tablet Take 40 mg by mouth every evening.     Historical Provider, MD  temazepam (RESTORIL) 30 MG capsule Take 30 mg by mouth at bedtime as needed  for sleep.    Historical Provider, MD  tiotropium (SPIRIVA) 18 MCG inhalation capsule Place 1 capsule (18 mcg total) into inhaler and inhale daily. 10/26/15   Brand Males, MD  Vitamin D, Ergocalciferol, (DRISDOL) 50000 UNITS CAPS Take 1 tablet by mouth Once every 2 weeks. 05/07/11   Historical Provider, MD   BP 134/105 mmHg  Pulse 125  Temp(Src) 99.1 F (37.3 C) (Oral)  Resp 28  Ht '5\' 9"'$  (1.753 m)  Wt 91.4 kg  BMI 29.74 kg/m2  SpO2 94% Physical Exam  Constitutional: He is oriented to person, place, and time.  Chronically ill appearing  HENT:  Right Ear: External ear normal.  Left Ear: External ear normal.  Nose: Nose normal.  Mouth/Throat: Oropharynx is clear and moist. No oropharyngeal exudate.  Eyes: Conjunctivae and EOM are normal. Pupils are equal, round, and reactive to light.  Neck: Normal range of motion. Neck supple.  Cardiovascular: Regular rhythm, normal heart sounds and intact distal pulses.  Tachycardia present.   Pulmonary/Chest: Effort normal and breath sounds normal. No respiratory distress. He has no wheezes. He exhibits no tenderness.  Decreased breath sounds and poor air movement throughout. Some soft end expiratory wheezing. +increased WOB with some pauses mid-sentence. No tachypnea.   Abdominal: Soft. Bowel sounds are normal. He exhibits no distension. There is no tenderness. There is no rebound and no guarding.  Musculoskeletal: He exhibits no edema.  Neurological: He is alert and oriented to person, place, and time. No cranial nerve deficit.  Skin: Skin is warm and dry.  Psychiatric: He has a normal mood and affect.  Nursing note and vitals reviewed.   ED Course  Procedures (including critical care time) Labs Review Labs Reviewed  COMPREHENSIVE METABOLIC PANEL - Abnormal; Notable for the following:    Chloride 100 (*)    Glucose, Bld 117 (*)    Calcium 8.8 (*)    Total Bilirubin 1.3 (*)    All other components within normal limits  CBC WITH  DIFFERENTIAL/PLATELET - Abnormal; Notable for the following:    Monocytes Absolute 1.5 (*)    All other components within normal limits  URINALYSIS, ROUTINE W REFLEX MICROSCOPIC (NOT AT [facility]) - Abnormal; Notable for the following:    Color, Urine AMBER (*)    Hgb urine dipstick MODERATE (*)    Bilirubin Urine SMALL (*)    Ketones, ur 15 (*)    Protein, ur 100 (*)    All other components within  normal limits  URINE MICROSCOPIC-ADD ON - Abnormal; Notable for the following:    Squamous Epithelial / LPF 0-5 (*)    Bacteria, UA FEW (*)    Casts GRANULAR CAST (*)    All other components within normal limits  I-STAT CG4 LACTIC ACID, ED - Abnormal; Notable for the following:    Lactic Acid, Venous 2.08 (*)    All other components within normal limits  URINE CULTURE  I-STAT CG4 LACTIC ACID, ED  Randolm Idol, ED    Imaging Review Dg Chest 2 View  03/20/2016  CLINICAL DATA:  Cough, congestion, fever. History of right lung cancer status post lobectomy. EXAM: CHEST  2 VIEW COMPARISON:  CT chest dated 09/15/2015. FINDINGS: Postsurgical changes with volume loss in the right upper hemithorax. Left lung is clear. No superimposed opacities suspicious for pneumonia. No pleural effusion or pneumothorax. The heart is normal in size.  Rightward cardiomediastinal shift. Visualized osseous structures are within normal limits. IMPRESSION: Postsurgical changes in the right hemithorax. No evidence of acute cardiopulmonary disease. Electronically Signed   By: Julian Hy M.D.   On: 03/20/2016 18:16   I have personally reviewed and evaluated these images and lab results as part of my medical decision-making.   EKG Interpretation None      MDM   Final diagnoses:  COPD with exacerbation ([place])    Pt is an 60 y.o. male with complex medical history here from PCP's office for evaluation of respiratory distress/SOB. Positive flu test. Solumedrol and albuterol neb given at PCP. He has visibly increased  WOB on exam. His lungs have poor air movement throughout with some soft end expiratory wheezes. Given history there is some concern for PE. Lower suspicion for ACS. Will add troponin. Triage labs with lactic 2.08, otherwise unremarkable. UA pending.  I discussed case with attending MD Kohut who will assume care at this point. Continuous neb ordered. Fluids and tamiflu ordered.     Anne Ng, PA-C 03/20/16 2313  Virgel Manifold, MD 03/21/16 2218

## 2016-03-22 LAB — URINE CULTURE: CULTURE: NO GROWTH

## 2016-05-30 ENCOUNTER — Ambulatory Visit (INDEPENDENT_AMBULATORY_CARE_PROVIDER_SITE_OTHER): Payer: Commercial Managed Care - HMO | Admitting: Internal Medicine

## 2016-05-30 ENCOUNTER — Encounter: Payer: Self-pay | Admitting: Internal Medicine

## 2016-05-30 ENCOUNTER — Other Ambulatory Visit (INDEPENDENT_AMBULATORY_CARE_PROVIDER_SITE_OTHER): Payer: Commercial Managed Care - HMO

## 2016-05-30 ENCOUNTER — Telehealth: Payer: Self-pay | Admitting: Medical Oncology

## 2016-05-30 VITALS — BP 110/82 | HR 66 | Temp 97.8°F | Ht 70.0 in | Wt 209.4 lb

## 2016-05-30 DIAGNOSIS — J441 Chronic obstructive pulmonary disease with (acute) exacerbation: Secondary | ICD-10-CM | POA: Diagnosis not present

## 2016-05-30 DIAGNOSIS — D721 Eosinophilia, unspecified: Secondary | ICD-10-CM | POA: Insufficient documentation

## 2016-05-30 DIAGNOSIS — Z902 Acquired absence of lung [part of]: Secondary | ICD-10-CM

## 2016-05-30 DIAGNOSIS — Z889 Allergy status to unspecified drugs, medicaments and biological substances status: Secondary | ICD-10-CM | POA: Diagnosis not present

## 2016-05-30 DIAGNOSIS — Z87891 Personal history of nicotine dependence: Secondary | ICD-10-CM | POA: Insufficient documentation

## 2016-05-30 LAB — BASIC METABOLIC PANEL
BUN: 14 mg/dL (ref 6–23)
CHLORIDE: 104 meq/L (ref 96–112)
CO2: 31 meq/L (ref 19–32)
CREATININE: 0.9 mg/dL (ref 0.40–1.50)
Calcium: 9.7 mg/dL (ref 8.4–10.5)
GFR: 91.51 mL/min (ref >60.00)
GLUCOSE: 84 mg/dL (ref 70–99)
Potassium: 3.8 mEq/L (ref 3.5–5.1)
Sodium: 141 mEq/L (ref 135–145)

## 2016-05-30 LAB — CBC WITH DIFFERENTIAL/PLATELET
BASOS ABS: 0.1 10*3/uL (ref 0.0–0.1)
Basophils Relative: 0.9 % (ref 0.0–3.0)
EOS PCT: 2.3 % (ref 0.0–5.0)
Eosinophils Absolute: 0.2 10*3/uL (ref 0.0–0.7)
HCT: 42.2 % (ref 39.0–52.0)
HEMOGLOBIN: 14.3 g/dL (ref 13.0–17.0)
LYMPHS ABS: 1.3 10*3/uL (ref 0.7–4.0)
LYMPHS PCT: 17.9 % (ref 12.0–46.0)
MCHC: 34 g/dL (ref 30.0–36.0)
MCV: 85.6 fl (ref 78.0–100.0)
MONOS PCT: 10 % (ref 3.0–12.0)
Monocytes Absolute: 0.7 10*3/uL (ref 0.1–1.0)
NEUTROS PCT: 68.9 % (ref 43.0–77.0)
Neutro Abs: 4.9 10*3/uL (ref 1.4–7.7)
Platelets: 192 10*3/uL (ref 150.0–400.0)
RBC: 4.92 Mil/uL (ref 4.22–5.81)
RDW: 14.2 % (ref 11.5–15.5)
WBC: 7 10*3/uL (ref 4.0–10.5)

## 2016-05-30 MED ORDER — PREDNISONE 10 MG PO TABS: ORAL_TABLET | ORAL | Status: DC

## 2016-05-30 MED ORDER — DOXYCYCLINE HYCLATE 100 MG PO TABS: 100.0000 mg | ORAL_TABLET | Freq: Two times a day (BID) | ORAL | Status: DC

## 2016-05-30 NOTE — Telephone Encounter (Signed)
Requests to cancel Ct scan in Sept  per pt via [place]. Note to [place].

## 2016-05-30 NOTE — Patient Instructions (Addendum)
ICD-9-CM ICD-10-CM   1. COPD exacerbation ([place]) 491.21 J44.1   2. Eosinophilia 288.3 D72.1   3. History of seasonal allergies V15.09 Z88.9   4. Stopped smoking with greater than 40 pack year history V15.82 Z87.891   5. History of lobectomy of lung V12.59 Z90.2    Take doxycycline '100mg'$  po twice daily x 5 days; take after meals and avoid sunlight  Take prednisone 40 mg daily x 2 days, then '20mg'$  daily x 2 days, then '10mg'$  daily x 2 days, then '5mg'$  daily x 2 days and stop   Do cbc with diff, bmet Do Total IgE  Do full PFT Do CT chest with contrast (cancel sep 2017 CT)  Followup  - next several weeks for results review

## 2016-05-30 NOTE — Progress Notes (Signed)
Subjective:     Patient ID: Stephen [name], male   DOB: Mar 02, 1956, 60 y.o.   MRN: [id number]  HPI   OV 05/30/2016  Chief Complaint  Patient presents with  . Acute Visit    Flu x 2 times, coughing up green mucus, chest congestion, chest tightness x since March.    COPD MZ  Reports 03/18/16 he lost his dad and same day he had Flu. Subseqwuently also flu and since then never been teh same. Says multiple abx and pred courses. Has had 1 admit too. Constant cough, chest tightness, wheeze and sputum. Currently feels worse pst few days with green sputum. Says PCP Jerlyn Ly, MD has suggested he see me. Says compliabnt with meds. Wants to go into research trials but he does not qualify for one of ours aimed at preventing AECOPD due to his lobectomy hx. He is frustrated by that. Last spiro visualized Oct 2016 - gold stage 3 copd post bd fev1 1./8L/49%. Hard to say if he has declined in past 2 years (because oct 2015 to Oct 2016 post-BD  fev1 fluctated between 1.7L and 1.9L) though was 2L in early 2015 and 1.9 in 2013  Of ntoe he use dto be on allergy shots. HE stopped it when he enrolled in COPD IMPACT study in 2015 but he feels they were not helping him  Last CT chest sept 2016 without lung cancer recurrence  Last myo perf 2014 and low risk    has a past medical history of Allergic rhinitis, cause unspecified; Other emphysema ([place]); Unspecified vitamin D deficiency; Dyspnea; Osteoporosis; Thrombosed hemorrhoids; Blindness of left eye; Hypothyroidism; Lung cancer ([place]); Throat cancer ([place]); and Tubular adenoma of colon (2009).   reports that he quit smoking about 11 years ago. His smoking use included Cigarettes. He has a 48 pack-year smoking history. He has never used smokeless tobacco.  Past Surgical History  Procedure Laterality Date  . Appendectomy    . Lung lobectomy      RUL  . Rotator cuff repair    . Knee surgery    . Shoulder surgery    . Throat surgery      Laser surgery for  throat cancer  . Transthoracic echocardiogram  01/13/2012    EF=>55%; trace MR/TR;     Allergies  Allergen Reactions  . Codeine Other (See Comments)    unknown  . Cymbalta [Duloxetine Hcl] Nausea And Vomiting  . Montelukast Sodium Other (See Comments)    Causes sinusitis    Immunization History  Administered Date(s) Administered  . Influenza Split 08/30/2013, 09/08/2014, 09/06/2015  . Influenza Whole 10/01/2010, 10/02/2011  . Pneumococcal Conjugate-13 09/08/2014  . Pneumococcal Polysaccharide-23 10/30/2009    Family History  Problem Relation Age of Onset  . Cancer Mother     Brain tumor  . COPD Mother   . Cystic fibrosis Sister     also heart failure  . Heart disease Father     CABG  . Mental illness Brother   . Heart failure Paternal Grandmother   . Colon cancer Neg Hx   . Colon polyps Neg Hx   . Esophageal cancer Neg Hx   . Prostate cancer Father      Current outpatient prescriptions:  .  albuterol (PROVENTIL) (2.5 MG/3ML) 0.083% nebulizer solution, Take 2.5 mg by nebulization every 6 (six) hours as needed.  , Disp: , Rfl:  .  aspirin 81 MG tablet, Take 81 mg by mouth daily.  , Disp: , Rfl:  .  budesonide-formoterol (SYMBICORT) 160-4.5 MCG/ACT inhaler, Inhale 2 puffs into the lungs 2 (two) times daily., Disp: 1 Inhaler, Rfl: 6 .  levothyroxine (SYNTHROID, LEVOTHROID) 125 MCG tablet, Take 67.5 mcg by mouth daily before breakfast. , Disp: , Rfl:  .  nitroGLYCERIN (NITROSTAT) 0.4 MG SL tablet, Place 1 tablet (0.4 mg total) under the tongue every 5 (five) minutes as needed for chest pain., Disp: 20 tablet, Rfl: 0 .  omeprazole (PRILOSEC) 20 MG capsule, Take 1 capsule (20 mg total) by mouth daily., Disp: 30 capsule, Rfl: 11 .  phosphorus (PHOSPHA 250 NEUTRAL) 155-852-130 MG tablet, Take 1 tablet (250 mg total) by mouth 3 (three) times daily., Disp: 21 tablet, Rfl: 0 .  pravastatin (PRAVACHOL) 40 MG tablet, Take 40 mg by mouth every evening. , Disp: , Rfl:  .  temazepam  (RESTORIL) 30 MG capsule, Take 30 mg by mouth at bedtime as needed for sleep., Disp: , Rfl:  .  tiotropium (SPIRIVA) 18 MCG inhalation capsule, Place 1 capsule (18 mcg total) into inhaler and inhale daily., Disp: 30 capsule, Rfl: 6 .  Vitamin D, Ergocalciferol, (DRISDOL) 50000 UNITS CAPS, Take 1 tablet by mouth See admin instructions. Take 1 tablet by mouth every two weeks per patient, Disp: , Rfl:     Review of Systems     Objective:   Physical Exam Filed Vitals:   05/30/16 1223  BP: 110/82  Pulse: 66  Temp: 97.8 F (36.6 C)  TempSrc: Oral  Height: '5\' 10"'$  (1.778 m)  Weight: 209 lb 6.4 oz (94.983 kg)  SpO2: 98%        Assessment:       ICD-9-CM ICD-10-CM   1. COPD exacerbation ([facility]) 491.21 J44.1 IgE     CBC w/Diff     Basic Metabolic Panel (BMET)     CT Chest W Contrast  2. Eosinophilia 288.3 D72.1 IgE     CBC w/Diff     Basic Metabolic Panel (BMET)     CT Chest W Contrast  3. History of seasonal allergies V15.09 Z88.9 IgE     CBC w/Diff     Basic Metabolic Panel ([facility])     CT Chest W Contrast  4. Stopped smoking with greater than 40 pack year history V15.82 Z87.891 IgE     CBC w/Diff     Basic Metabolic Panel ([facility])     CT Chest W Contrast  5. History of lobectomy of lung V12.59 Z90.2 IgE     CBC w/Diff     Basic Metabolic Panel ([facility])     CT Chest W Contrast       Plan:     Currently Rx for AECOPD RE-eval allergis with CBC with diff and IgE Re-eval pulm status with PFT and CT chest If lung function shows  A dip esp since 2013 or 2015 consider alpha 1 replacement or atleast on basis of recurrent aecopd  Therefore plan  Take doxycycline '100mg'$  po twice daily x 5 days; take after meals and avoid sunlight Take prednisone 40 mg daily x 2 days, then '20mg'$  daily x 2 days, then '10mg'$  daily x 2 days, then '5mg'$  daily x 2 days and stop Do cbc with diff, bmet Do Total IgE  Do full PFT Do CT chest with contrast (cancel sep 2017 CT)  Followup  - next several weeks  for results review - consider alpha 1 replacement dependng on PFT   > 50% of this > 25 min visit spent in face to face counseling  or coordination of care       Dr. Brand Males, M.D., [facility].C.P Pulmonary and Critical Care Medicine Staff Physician [place] [facility] Pager: [phone], If no answer or between  15:00h - 7:00h: call [phone]  05/31/2016 1:18 AM

## 2016-05-31 ENCOUNTER — Telehealth: Payer: Self-pay | Admitting: Internal Medicine

## 2016-05-31 LAB — IGE: IgE (Immunoglobulin E), Serum: 5 kU/L (ref <115)

## 2016-05-31 NOTE — Telephone Encounter (Signed)
Let him Stephen [name] know that blood work for eosinohil and IgE is normal. So we do not think allergies playing a role. Will see what his CT and PFT show. We might  Depending on those results consider alpha 1 replacmement given frequent flare ups but that is a face to face discussion

## 2016-06-03 ENCOUNTER — Telehealth: Payer: Self-pay | Admitting: Internal Medicine

## 2016-06-03 ENCOUNTER — Other Ambulatory Visit: Payer: Self-pay | Admitting: Medical Oncology

## 2016-06-03 NOTE — Telephone Encounter (Signed)
Spoke with pt, aware of results/recs.  Nothing further needed.  

## 2016-06-03 NOTE — Telephone Encounter (Signed)
s.w. pt and r/s appt from Sept to [facility].Marland KitchenMarland KitchenMarland KitchenMarland Kitchenpt ok and aware....mailed pt appt sched and letter

## 2016-06-04 ENCOUNTER — Ambulatory Visit (HOSPITAL_COMMUNITY)
Admission: RE | Admit: 2016-06-04 | Discharge: 2016-06-04 | Disposition: A | Discharge status: Home / Self Care | Payer: Commercial Managed Care - HMO | Source: Ambulatory Visit | Attending: Internal Medicine | Admitting: Internal Medicine

## 2016-06-04 ENCOUNTER — Ambulatory Visit (INDEPENDENT_AMBULATORY_CARE_PROVIDER_SITE_OTHER)
Admission: RE | Admit: 2016-06-04 | Discharge: 2016-06-04 | Disposition: A | Discharge status: Home / Self Care | Payer: Commercial Managed Care - HMO | Source: Ambulatory Visit | Attending: Internal Medicine | Admitting: Internal Medicine

## 2016-06-04 DIAGNOSIS — D721 Eosinophilia, unspecified: Secondary | ICD-10-CM

## 2016-06-04 DIAGNOSIS — J441 Chronic obstructive pulmonary disease with (acute) exacerbation: Secondary | ICD-10-CM | POA: Diagnosis not present

## 2016-06-04 DIAGNOSIS — Z902 Acquired absence of lung [part of]: Secondary | ICD-10-CM | POA: Diagnosis not present

## 2016-06-04 DIAGNOSIS — J449 Chronic obstructive pulmonary disease, unspecified: Secondary | ICD-10-CM | POA: Diagnosis present

## 2016-06-04 DIAGNOSIS — Z87891 Personal history of nicotine dependence: Secondary | ICD-10-CM | POA: Insufficient documentation

## 2016-06-04 DIAGNOSIS — Z889 Allergy status to unspecified drugs, medicaments and biological substances status: Secondary | ICD-10-CM | POA: Diagnosis not present

## 2016-06-04 LAB — PULMONARY FUNCTION TEST
DL/VA % PRED: 81 %
DL/VA: 3.76 ml/min/mmHg/L
DLCO COR % PRED: 60 %
DLCO cor: 19.51 ml/min/mmHg
DLCO unc % pred: 59 %
DLCO unc: 19.34 ml/min/mmHg
FEF 25-75 Post: 0.89 L/sec
FEF 25-75 Pre: 0.47 L/sec
FEF2575-%Change-Post: 87 %
FEF2575-%Pred-Post: 29 %
FEF2575-%Pred-Pre: 15 %
FEV1-%CHANGE-POST: 17 %
FEV1-%PRED-PRE: 45 %
FEV1-%Pred-Post: 53 %
FEV1-POST: 1.94 L
FEV1-Pre: 1.65 L
FEV1FVC-%CHANGE-POST: 0 %
FEV1FVC-%Pred-Pre: 62 %
FEV6-%Change-Post: 16 %
FEV6-%PRED-PRE: 62 %
FEV6-%Pred-Post: 73 %
FEV6-POST: 3.35 L
FEV6-PRE: 2.87 L
FEV6FVC-%Change-Post: -1 %
FEV6FVC-%PRED-POST: 86 %
FEV6FVC-%Pred-Pre: 87 %
FVC-%CHANGE-POST: 18 %
FVC-%PRED-POST: 85 %
FVC-%PRED-PRE: 72 %
FVC-POST: 4.08 L
FVC-PRE: 3.46 L
POST FEV6/FVC RATIO: 82 %
PRE FEV6/FVC RATIO: 83 %
Post FEV1/FVC ratio: 48 %
Pre FEV1/FVC ratio: 48 %
RV % pred: 113 %
RV: 2.54 L
TLC % PRED: 93 %
TLC: 6.56 L

## 2016-06-04 MED ORDER — ALBUTEROL SULFATE (2.5 MG/3ML) 0.083% IN NEBU
2.5000 mg | INHALATION_SOLUTION | Freq: Once | RESPIRATORY_TRACT | Status: AC
  Administered 2016-06-04: 2.5 mg via RESPIRATORY_TRACT

## 2016-06-04 MED ORDER — IOPAMIDOL (ISOVUE-300) INJECTION 61%
80.0000 mL | Freq: Once | INTRAVENOUS | Status: AC | PRN
  Administered 2016-06-04: 80 mL via INTRAVENOUS

## 2016-06-05 ENCOUNTER — Telehealth: Payer: Self-pay | Admitting: Internal Medicine

## 2016-06-05 NOTE — Telephone Encounter (Signed)
Let Stephen [name] know tthat nothing new on CT and PFT stable x 1 year. Unclera why recurrent aecopd despite  Compliance. Can talk to him about alpha 1 replacement at fu

## 2016-06-06 NOTE — Telephone Encounter (Signed)
Spoke with pt, aware of results/recs.  Nothing further needed.  

## 2016-06-06 NOTE — Telephone Encounter (Signed)
Pt returning call to [facility]

## 2016-06-06 NOTE — Telephone Encounter (Signed)
lmtcb X1 for pt to relay results/recs.

## 2016-07-17 ENCOUNTER — Encounter: Payer: Self-pay | Admitting: Internal Medicine

## 2016-07-17 ENCOUNTER — Ambulatory Visit (INDEPENDENT_AMBULATORY_CARE_PROVIDER_SITE_OTHER): Payer: Commercial Managed Care - HMO | Admitting: Internal Medicine

## 2016-07-17 VITALS — BP 118/74 | HR 80 | Ht 70.0 in | Wt 209.0 lb

## 2016-07-17 DIAGNOSIS — Z148 Genetic carrier of other disease: Secondary | ICD-10-CM | POA: Insufficient documentation

## 2016-07-17 DIAGNOSIS — J449 Chronic obstructive pulmonary disease, unspecified: Secondary | ICD-10-CM

## 2016-07-17 DIAGNOSIS — E8801 Alpha-1-antitrypsin deficiency: Secondary | ICD-10-CM | POA: Diagnosis not present

## 2016-07-17 MED ORDER — ROFLUMILAST 500 MCG PO TABS: 500.0000 ug | ORAL_TABLET | Freq: Every day | ORAL | Status: DC

## 2016-07-17 NOTE — Addendum Note (Signed)
Addended by: Len Blalock on: 07/17/2016 03:42 PM   Modules accepted: Orders

## 2016-07-17 NOTE — Progress Notes (Signed)
Subjective:     Patient ID: Stephen [name], male   DOB: 1956/09/15, 60 y.o.   MRN: [id number]  HPI    OV 05/30/2016  Chief Complaint  Patient presents with  . Acute Visit    Flu x 2 times, coughing up green mucus, chest congestion, chest tightness x since March.    COPD MZ  Reports 03/18/16 he lost his dad and same day he had Flu. Subseqwuently also flu and since then never been teh same. Says multiple abx and pred courses. Has had 1 admit too. Constant cough, chest tightness, wheeze and sputum. Currently feels worse pst few days with green sputum. Says PCP Jerlyn Ly, MD has suggested he see me. Says compliabnt with meds. Wants to go into research trials but he does not qualify for one of ours aimed at preventing AECOPD due to his lobectomy hx. He is frustrated by that. Last spiro visualized Oct 2016 - gold stage 3 copd post bd fev1 1./8L/49%. Hard to say if he has declined in past 2 years (because oct 2015 to Oct 2016 post-BD  fev1 fluctated between 1.7L and 1.9L) though was 2L in early 2015 and 1.9 in 2013  Of ntoe he use dto be on allergy shots. HE stopped it when he enrolled in COPD IMPACT study in 2015 but he feels they were not helping him  Last CT chest sept 2016 without lung cancer recurrence  Last myo perf 2014 and low risk   OV 07/17/2016  Chief Complaint  Patient presents with  . Follow-up    review labs, ct, and pft from last office visit.  Pt c/o prod cough with yellow/green mucus.     Follow-up COPD with MZ phenotype recurrent COPD exacerbation  Last visit was in June 2017 and we initiated a workup to account for recurrent exacerbation. He did CT chest on pulmonary function test, IgE and blood eosinophilia. All this was normal. However, function test showed obstruction but it is improved compared to baseline. Therefore been unable to cone for COPD exacerbation recurrent. At this point in time he is feeling stable. The summer is hard and humid but he says at home. He  attributes compliance with the Spiriva and Symbicort. We discussed several options to prevent COPD exacerbation    has a past medical history of Allergic rhinitis, cause unspecified; Other emphysema ([place]); Unspecified vitamin D deficiency; Dyspnea; Osteoporosis; Thrombosed hemorrhoids; Blindness of left eye; Hypothyroidism; Lung cancer ([place]); Throat cancer ([place]); and Tubular adenoma of colon (2009).   reports that he quit smoking about 11 years ago. His smoking use included Cigarettes. He has a 48 pack-year smoking history. He has never used smokeless tobacco.  Past Surgical History  Procedure Laterality Date  . Appendectomy    . Lung lobectomy      RUL  . Rotator cuff repair    . Knee surgery    . Shoulder surgery    . Throat surgery      Laser surgery for throat cancer  . Transthoracic echocardiogram  01/13/2012    EF=>55%; trace MR/TR;     Allergies  Allergen Reactions  . Codeine Other (See Comments)    unknown  . Cymbalta [Duloxetine Hcl] Nausea And Vomiting  . Montelukast Sodium Other (See Comments)    Causes sinusitis    Immunization History  Administered Date(s) Administered  . Influenza Split 08/30/2013, 09/08/2014, 09/06/2015  . Influenza Whole 10/01/2010, 10/02/2011  . Pneumococcal Conjugate-13 09/08/2014  . Pneumococcal Polysaccharide-23 10/30/2009  Family History  Problem Relation Age of Onset  . Cancer Mother     Brain tumor  . COPD Mother   . Cystic fibrosis Sister     also heart failure  . Heart disease Father     CABG  . Mental illness Brother   . Heart failure Paternal Grandmother   . Colon cancer Neg Hx   . Colon polyps Neg Hx   . Esophageal cancer Neg Hx   . Prostate cancer Father      Current outpatient prescriptions:  .  albuterol (PROVENTIL) (2.5 MG/3ML) 0.083% nebulizer solution, Take 2.5 mg by nebulization every 6 (six) hours as needed.  , Disp: , Rfl:  .  aspirin 81 MG tablet, Take 81 mg by mouth daily.  , Disp: , Rfl:  .   budesonide-formoterol (SYMBICORT) 160-4.5 MCG/ACT inhaler, Inhale 2 puffs into the lungs 2 (two) times daily., Disp: 1 Inhaler, Rfl: 6 .  levothyroxine (SYNTHROID, LEVOTHROID) 125 MCG tablet, Take 67.5 mcg by mouth daily before breakfast. , Disp: , Rfl:  .  nitroGLYCERIN (NITROSTAT) 0.4 MG SL tablet, Place 1 tablet (0.4 mg total) under the tongue every 5 (five) minutes as needed for chest pain., Disp: 20 tablet, Rfl: 0 .  pravastatin (PRAVACHOL) 40 MG tablet, Take 40 mg by mouth every evening. , Disp: , Rfl:  .  temazepam (RESTORIL) 30 MG capsule, Take 30 mg by mouth at bedtime as needed for sleep., Disp: , Rfl:  .  tiotropium (SPIRIVA) 18 MCG inhalation capsule, Place 1 capsule (18 mcg total) into inhaler and inhale daily., Disp: 30 capsule, Rfl: 6 .  Vitamin D, Ergocalciferol, (DRISDOL) 50000 UNITS CAPS, Take 1 tablet by mouth See admin instructions. Take 1 tablet by mouth every two weeks per patient, Disp: , Rfl:  .  phosphorus (PHOSPHA 250 NEUTRAL) 155-852-130 MG tablet, Take 1 tablet (250 mg total) by mouth 3 (three) times daily. (Patient not taking: Reported on 07/17/2016), Disp: 21 tablet, Rfl: 0    Review of Systems     Objective:   Physical Exam  Constitutional: He is oriented to person, place, and time. He appears well-developed and well-nourished. No distress.  HENT:  Head: Normocephalic and atraumatic.  Right Ear: External ear normal.  Left Ear: External ear normal.  Mouth/Throat: Oropharynx is clear and moist. No oropharyngeal exudate.  Eyes: Conjunctivae and EOM are normal. Pupils are equal, round, and reactive to light. Right eye exhibits no discharge. Left eye exhibits no discharge. No scleral icterus.  Neck: Normal range of motion. Neck supple. No JVD present. No tracheal deviation present. No thyromegaly present.  Cardiovascular: Normal rate, regular rhythm and intact distal pulses.  Exam reveals no gallop and no friction rub.   No murmur heard. Pulmonary/Chest: Effort  normal and breath sounds normal. No respiratory distress. He has no wheezes. He has no rales. He exhibits no tenderness.  Abdominal: Soft. Bowel sounds are normal. He exhibits no distension and no mass. There is no tenderness. There is no rebound and no guarding.  Musculoskeletal: Normal range of motion. He exhibits no edema or tenderness.  Lymphadenopathy:    He has no cervical adenopathy.  Neurological: He is alert and oriented to person, place, and time. He has normal reflexes. No cranial nerve deficit. Coordination normal.  Skin: Skin is warm and dry. No rash noted. He is not diaphoretic. No erythema. No pallor.  Psychiatric: He has a normal mood and affect. His behavior is normal. Judgment and thought content normal.  Nursing note and vitals reviewed.   Filed Vitals:   07/17/16 1455  BP: 118/74  Pulse: 80  Height: '5\' 10"'$  (1.778 m)  Weight: 209 lb (94.802 kg)  SpO2: 97%        Assessment:       ICD-9-CM ICD-10-CM   1. COPD, severe ([place]) 496 J44.9   2. Alpha-1-antitrypsin deficiency carrier ([place]) V83.89 E88.01        Plan:      Stable disease but continue to get recurrent exacerbations despite compliance  - No evidence of elevated IgE Oo eosinophilia or new abnormalities on CT chest protocol for recurrent exacerbations  - Lung function is stable  Plan - We discussed several options including referral to a research study versus alpha-1 replacement versus continue same course versus trying rolfumilast. We have resolved that you would   - Take new tablet called roflumilast 1 tablet   - take 1 month sample then go to pharmacy and pick it up. Could be expensive. If so call us  -first week take every other day  - after that take daily  - always take after food  - if causes nausea, vomit, call - Continue COPD medications as before Spiriva and Symbicort  - Albuterol as needed - Flu shot in the fall  Follow-up - 4-6 months or sooner if needed    > 50% of this > 25 min  visit spent in face to face counseling or coordination of care    Dr. Brand Males, M.D., [facility].C.P Pulmonary and Critical Care Medicine Staff Physician [place] [facility] Pager: [phone], If no answer or between  15:00h - 7:00h: call [phone]  07/17/2016 3:37 PM

## 2016-07-17 NOTE — Patient Instructions (Addendum)
ICD-9-CM ICD-10-CM   1. COPD, severe ([place]) 496 J44.9     Stable disease but continue to get recurrent exacerbations despite compliance  - No evidence of elevated IgE Oo eosinophilia or new abnormalities on CT chest protocol for recurrent exacerbations  - Lung function is stable  Plan - We discussed several options including referral to a research study versus alpha-1 replacement versus continue same course versus trying rolfumilast. We have resolved that you would   - Take new tablet called roflumilast 1 tablet   - take 1 month sample then go to pharmacy and pick it up. Could be expensive. If so call us  -first week take every other day  - after that take daily  - always take after food  - if causes nausea, vomit, call - Continue COPD medications as before Spiriva and Symbicort  - Albuterol as needed - Flu shot in the fall  Follow-up - 4-6 months or sooner if needed

## 2016-07-18 ENCOUNTER — Telehealth: Payer: Self-pay | Admitting: Internal Medicine

## 2016-07-18 NOTE — Telephone Encounter (Signed)
Called spoke with pt. He states that the coupon he was given on 07/17/16 at the ov has expired. He is requesting a new coupon or samples. I explained to him that we do not have any more coupons or samples at this time. I offered to send a message to MR pt refused stating he would purchase the medication for a trial run. He states he just wanted to try and get the first month free in case he has side effects and was unable to continue therapy. He voiced understanding and had no further questions. Nothing further needed at this time.

## 2016-08-19 ENCOUNTER — Telehealth: Payer: Self-pay | Admitting: *Deleted

## 2016-08-19 NOTE — Telephone Encounter (Signed)
Patient called requesting verification of appointments to coordinate appointments with other offices.  December appointment information provided.  Verified there are no appointments in September.  No further questions at this time.

## 2016-09-16 ENCOUNTER — Ambulatory Visit (HOSPITAL_COMMUNITY): Payer: Commercial Managed Care - HMO

## 2016-09-16 ENCOUNTER — Other Ambulatory Visit: Payer: Commercial Managed Care - HMO

## 2016-09-23 ENCOUNTER — Ambulatory Visit: Payer: Commercial Managed Care - HMO | Admitting: Internal Medicine

## 2016-11-28 ENCOUNTER — Telehealth: Payer: Self-pay | Admitting: Internal Medicine

## 2016-11-28 NOTE — Telephone Encounter (Signed)
Spoke with the pt  He states received a letter from ins stating that one of his labs is not covered b/c his ins is not in contract with [facility]  He states that he is unsure what lab his ins will work with I advised it would be helpful for him to find this out prior to Jan appt in case we need to do more labs  He will bring the letter to next appt so we can discuss  Nothing further needed

## 2016-12-02 ENCOUNTER — Other Ambulatory Visit (HOSPITAL_BASED_OUTPATIENT_CLINIC_OR_DEPARTMENT_OTHER): Payer: Commercial Managed Care - HMO

## 2016-12-02 DIAGNOSIS — C3491 Malignant neoplasm of unspecified part of right bronchus or lung: Secondary | ICD-10-CM | POA: Diagnosis not present

## 2016-12-02 LAB — COMPREHENSIVE METABOLIC PANEL
ALBUMIN: 3.9 g/dL (ref 3.5–5.0)
ALK PHOS: 124 U/L (ref 40–150)
ALT: 19 U/L (ref 0–55)
AST: 22 U/L (ref 5–34)
Anion Gap: 8 mEq/L (ref 3–11)
BUN: 11.4 mg/dL (ref 7.0–26.0)
CO2: 29 mEq/L (ref 22–29)
CREATININE: 1 mg/dL (ref 0.7–1.3)
Calcium: 9.4 mg/dL (ref 8.4–10.4)
Chloride: 104 mEq/L (ref 98–109)
EGFR: 82 mL/min/{1.73_m2} — ABNORMAL LOW (ref >90)
GLUCOSE: 104 mg/dL (ref 70–140)
POTASSIUM: 3.7 meq/L (ref 3.5–5.1)
SODIUM: 141 meq/L (ref 136–145)
Total Bilirubin: 1.12 mg/dL (ref 0.20–1.20)
Total Protein: 7.7 g/dL (ref 6.4–8.3)

## 2016-12-02 LAB — CBC WITH DIFFERENTIAL/PLATELET
BASO%: 1.4 % (ref 0.0–2.0)
BASOS ABS: 0.1 10*3/uL (ref 0.0–0.1)
EOS%: 3.3 % (ref 0.0–7.0)
Eosinophils Absolute: 0.2 10*3/uL (ref 0.0–0.5)
HEMATOCRIT: 46.4 % (ref 38.4–49.9)
HEMOGLOBIN: 15.6 g/dL (ref 13.0–17.1)
LYMPH#: 0.9 10*3/uL (ref 0.9–3.3)
LYMPH%: 14.9 % (ref 14.0–49.0)
MCH: 28.6 pg (ref 27.2–33.4)
MCHC: 33.6 g/dL (ref 32.0–36.0)
MCV: 85.1 fL (ref 79.3–98.0)
MONO#: 0.6 10*3/uL (ref 0.1–0.9)
MONO%: 9.7 % (ref 0.0–14.0)
NEUT%: 70.7 % (ref 39.0–75.0)
NEUTROS ABS: 4.1 10*3/uL (ref 1.5–6.5)
Platelets: 192 10*3/uL (ref 140–400)
RBC: 5.45 10*6/uL (ref 4.20–5.82)
RDW: 13.2 % (ref 11.0–14.6)
WBC: 5.8 10*3/uL (ref 4.0–10.3)

## 2016-12-09 ENCOUNTER — Encounter: Payer: Self-pay | Admitting: Internal Medicine

## 2016-12-09 ENCOUNTER — Telehealth: Payer: Self-pay | Admitting: Internal Medicine

## 2016-12-09 ENCOUNTER — Ambulatory Visit (HOSPITAL_BASED_OUTPATIENT_CLINIC_OR_DEPARTMENT_OTHER): Payer: Commercial Managed Care - HMO | Admitting: Internal Medicine

## 2016-12-09 VITALS — BP 136/88 | HR 66 | Temp 97.9°F | Resp 18 | Ht 70.0 in | Wt 207.2 lb

## 2016-12-09 DIAGNOSIS — C3491 Malignant neoplasm of unspecified part of right bronchus or lung: Secondary | ICD-10-CM | POA: Diagnosis not present

## 2016-12-09 NOTE — Telephone Encounter (Signed)
Gave patient avs report and appointments for June. Central radiology will call re scan.  °

## 2016-12-09 NOTE — Progress Notes (Signed)
[facility] [phone]   Fax:([phone]  OFFICE PROGRESS NOTE  Jerlyn Ly, MD [place] [id number]  DIAGNOSIS:  1. Recurrent non-small cell lung cancer, presented with endobronchial lesion involving the right main stem bronchus diagnosed in March 2007.  2. History of stage IB non-small cell lung cancer diagnosed in May 2006.  3. 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  PRIOR THERAPY: 1. Status post right upper lobectomy on May 01, 2005 under the care of Dr. Arlyce Dice.  2. Status post laryngoscopy with CO2 laser excision of the right vocal cord lesion under the care of Dr. Redmond Baseman in March 2007.  3. Status post concurrent chemoradiation with weekly carboplatin and paclitaxel. Last dose was given May 12, 2006.  4. Status post 3 cycles of consolidation chemotherapy with docetaxel. Last dose was given August 21, 2006.  CURRENT THERAPY: Observation.  INTERVAL HISTORY: Stephen [name] 60 y.o. male returns to the clinic today for annual follow-up visit. The patient is feeling fine today with no specific complaints except for mild sinusitis. He denied having any chest pain or shortness of breath. He has no cough or hemoptysis. He denied having any weight loss or night sweats. He has no nausea or vomiting. He was seen in June 2017 by Dr. Chase Caller and CT scan of the chest was performed at that time. He is here today for his annual follow-up visit.  MEDICAL HISTORY: Past Medical History:  Diagnosis Date  . Allergic rhinitis, cause unspecified   . Blindness of left eye   . Dyspnea   . Hypothyroidism   . Lung cancer ([place])    lung ca dx 06  . Osteoporosis   . Other emphysema ([place])    copd  . Throat cancer ([place])    throat ca dx 2007  . Thrombosed hemorrhoids   . Tubular adenoma of colon 2009  . Unspecified vitamin D deficiency     ALLERGIES:  is allergic to codeine; cymbalta [duloxetine  hcl]; and montelukast sodium.  MEDICATIONS:  Current Outpatient Prescriptions  Medication Sig Dispense Refill  . albuterol (PROVENTIL) (2.5 MG/3ML) 0.083% nebulizer solution Take 2.5 mg by nebulization every 6 (six) hours as needed.      Marland Kitchen aspirin 81 MG tablet Take 81 mg by mouth daily.      . budesonide-formoterol (SYMBICORT) 160-4.5 MCG/ACT inhaler Inhale 2 puffs into the lungs 2 (two) times daily. 1 Inhaler 6  . levothyroxine (SYNTHROID, LEVOTHROID) 125 MCG tablet Take 67.5 mcg by mouth daily before breakfast.     . nitroGLYCERIN (NITROSTAT) 0.4 MG SL tablet Place 1 tablet (0.4 mg total) under the tongue every 5 (five) minutes as needed for chest pain. 20 tablet 0  . phosphorus (PHOSPHA 250 NEUTRAL) 155-852-130 MG tablet Take 1 tablet (250 mg total) by mouth 3 (three) times daily. (Patient not taking: Reported on 07/17/2016) 21 tablet 0  . pravastatin (PRAVACHOL) 40 MG tablet Take 40 mg by mouth every evening.     . roflumilast (DALIRESP) 500 MCG TABS tablet Take 1 tablet (500 mcg total) by mouth daily. 30 tablet 11  . temazepam (RESTORIL) 30 MG capsule Take 30 mg by mouth at bedtime as needed for sleep.    Marland Kitchen tiotropium (SPIRIVA) 18 MCG inhalation capsule Place 1 capsule (18 mcg total) into inhaler and inhale daily. 30 capsule 6  . Vitamin D, Ergocalciferol, (DRISDOL) 50000 UNITS CAPS Take 1 tablet by  mouth See admin instructions. Take 1 tablet by mouth every two weeks per patient     No current facility-administered medications for this visit.     SURGICAL HISTORY:  Past Surgical History:  Procedure Laterality Date  . APPENDECTOMY    . KNEE SURGERY    . LUNG LOBECTOMY     RUL  . ROTATOR CUFF REPAIR    . SHOULDER SURGERY    . THROAT SURGERY     Laser surgery for throat cancer  . TRANSTHORACIC ECHOCARDIOGRAM  01/13/2012   EF=>55%; trace MR/TR;     REVIEW OF SYSTEMS:  A comprehensive review of systems was negative except for: Ears, nose, mouth, throat, and face: positive for  Sinusitis   PHYSICAL EXAMINATION: General appearance: alert, cooperative and no distress Head: Normocephalic, without obvious abnormality, atraumatic Neck: no adenopathy, no JVD, supple, symmetrical, trachea midline and thyroid not enlarged, symmetric, no tenderness/mass/nodules Lymph nodes: Cervical, supraclavicular, and axillary nodes normal. Resp: clear to auscultation bilaterally Back: symmetric, no curvature. ROM normal. No CVA tenderness. Cardio: regular rate and rhythm, S1, S2 normal, no murmur, click, rub or gallop GI: soft, non-tender; bowel sounds normal; no masses,  no organomegaly Extremities: extremities normal, atraumatic, no cyanosis or edema  ECOG PERFORMANCE STATUS: 1 - Symptomatic but completely ambulatory  Blood pressure 136/88, pulse 66, temperature 97.9 F (36.6 C), temperature source Oral, resp. rate 18, height '5\' 10"'$  (1.778 m), weight 207 lb 3.2 oz (94 kg), SpO2 97 %.  LABORATORY DATA: Lab Results  Component Value Date   WBC 5.8 12/02/2016   HGB 15.6 12/02/2016   HCT 46.4 12/02/2016   MCV 85.1 12/02/2016   PLT 192 12/02/2016      Chemistry      Component Value Date/Time   NA 141 12/02/2016 0857   K 3.7 12/02/2016 0857   CL 104 05/30/2016 1316   CL 106 09/14/2012 0808   CO2 29 12/02/2016 0857   BUN 11.4 12/02/2016 0857   CREATININE 1.0 12/02/2016 0857      Component Value Date/Time   CALCIUM 9.4 12/02/2016 0857   ALKPHOS 124 12/02/2016 0857   AST 22 12/02/2016 0857   ALT 19 12/02/2016 0857   BILITOT 1.12 12/02/2016 0857       RADIOGRAPHIC STUDIES: No results found.  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 The patient is status post concurrent chemoradiation and he is currently on observation. His last CT scan of the chest in June 2017 showed no evidence for disease recurrence. I  recommended for the patient to continue on observation with repeat CT scan of the chest in June 2018. He was advised to call immediately if he has any concerning symptoms in the interval. The patient voices understanding of current disease status and treatment options and is in agreement with the current care plan.  All questions were answered. The patient knows to call the clinic with any problems, questions or concerns. We can certainly see the patient much sooner if necessary.  I spent 10 minutes counseling the patient face to face. The total time spent in the appointment was 15 minutes.  Disclaimer: This note was dictated with voice recognition software. Similar sounding words can inadvertently be transcribed and may not be corrected upon review.

## 2016-12-26 ENCOUNTER — Telehealth: Payer: Self-pay | Admitting: Internal Medicine

## 2016-12-26 NOTE — Telephone Encounter (Signed)
RESEARCH NOTE for COPD IMPACT study - documentation of face to face conversation  On Monday 12/02/16 at [place] [birth year] was on the following study.   - [id number]: [employer] IMPACT research study: blinded >/= 52 week randomized trial of ANORO equivalent v BREO equivalent v GSK triple MDI (ICS + LABA + LAMA). Sponsored by [employer] .   - He is no longer on the study. He finished end of treatment. He is now on standard of care therapy for many months. A few months ago during close out visit and monitoring evaluation it was discovered that subject during the course of  Study he had electrocardiogram (EKG)done at incorrect time points. This contributing to potential safety monitoring protocol deviation. I discussed this with RUSHIL KIMBRELL and disclosed this to DEARIES MEIKLE 12/02/16 Monday morning ni face to face conversation. The potential safety impact of this protocol deviation and safety of medication is very minimal based on approval time of IP that were already in market and other LABA, LAMA, ICS in market.   - Hhe verbalized understanding and had no further questions. He appreciated the disclosure   Dr. Brand Males, M.D., [facility].C.P Pulmonary and Critical Care Medicine Staff Physician [place] [facility] Pager: [phone], If no answer or between  15:00h - 7:00h: call [phone]  12/26/2016 6:49 AM

## 2017-01-27 ENCOUNTER — Ambulatory Visit (INDEPENDENT_AMBULATORY_CARE_PROVIDER_SITE_OTHER): Payer: Commercial Managed Care - HMO | Admitting: Internal Medicine

## 2017-01-27 ENCOUNTER — Encounter: Payer: Self-pay | Admitting: Internal Medicine

## 2017-01-27 VITALS — BP 126/86 | HR 73 | Ht 70.0 in | Wt 209.0 lb

## 2017-01-27 DIAGNOSIS — J449 Chronic obstructive pulmonary disease, unspecified: Secondary | ICD-10-CM | POA: Diagnosis not present

## 2017-01-27 NOTE — Progress Notes (Signed)
Subjective:     Patient ID: Stephen [name], male   DOB: 02/05/1956, 61 y.o.   MRN: [id number]  HPI   OV 01/27/2017  Chief Complaint  Patient presents with  . Follow-up    Breathing is overall doing well. No new co's today. He     Follow-up moderate to severe COPD with a history of recurrent exacerbations and MZ phenotype  Since his last visit he continues to be stable. There are no new exacerbations. He did see Dr. Julien Nordmann oncologist 12/09/2016. I reviewed that note and summarized the fact that he has no recurrence and a follow-up CT chest is planned for June 2018. Most recent CT chest is from June 2017 and this is reviewed below. I did not visualize this film. For his recurrent exacerbations we did discuss alpha-1 replacement but we opted to take Daliresp. However he tells me that he is mostly noncompliant with it. He is willing to retry this. He is up-to-date with his flu shot.  CT chest June 2017: IMPRESSION: 1. No significant change in treatment effects within the right hemi thorax, including right upper lobectomy and presumed radiation induced consolidation at the right apex. 2. centrilobular emphysema, without other explanation for patient's symptoms. 3. Similar right middle lobe pulmonary nodule, favoring a benign etiology. 4. Cholelithiasis.   Electronically Signed   By: Abigail Miyamoto M.D.   On: 06/04/2016 09:33   Results for Stephen [name] (MRN [id number]) as of 01/27/2017 10:59  Ref. Range 06/04/2016 10:25  FEV1-Post Latest Units: L 1.94  FEV1-%Pred-Post Latest Units: % 53  FEV1-%Change-Post Latest Units: % 17  Post FEV1/FVC ratio Latest Units: % 48     has a past medical history of Allergic rhinitis, cause unspecified; Blindness of left eye; Dyspnea; Hypothyroidism; Lung cancer ([place]); Osteoporosis; Other emphysema ([place]); Throat cancer ([place]); Thrombosed hemorrhoids; Tubular adenoma of colon (2009); and Unspecified vitamin D deficiency.   reports that he quit  smoking about 12 years ago. His smoking use included Cigarettes. He has a 48.00 pack-year smoking history. He has never used smokeless tobacco.  Past Surgical History:  Procedure Laterality Date  . APPENDECTOMY    . KNEE SURGERY    . LUNG LOBECTOMY     RUL  . ROTATOR CUFF REPAIR    . SHOULDER SURGERY    . THROAT SURGERY     Laser surgery for throat cancer  . TRANSTHORACIC ECHOCARDIOGRAM  01/13/2012   EF=>55%; trace MR/TR;     Allergies  Allergen Reactions  . Codeine Other (See Comments)    unknown  . Cymbalta [Duloxetine Hcl] Nausea And Vomiting  . Montelukast Sodium Other (See Comments)    Causes sinusitis    Immunization History  Administered Date(s) Administered  . Influenza Split 08/30/2013, 09/08/2014, 09/06/2015  . Influenza Whole 10/01/2010, 10/02/2011, 09/29/2016  . Pneumococcal Conjugate-13 09/08/2014  . Pneumococcal Polysaccharide-23 10/30/2009  . Zoster 09/09/2016    Family History  Problem Relation Age of Onset  . Cancer Mother     Brain tumor  . COPD Mother   . Heart disease Father     CABG  . Prostate cancer Father   . Cystic fibrosis Sister     also heart failure  . Mental illness Brother   . Heart failure Paternal Grandmother   . Colon cancer Neg Hx   . Colon polyps Neg Hx   . Esophageal cancer Neg Hx      Current Outpatient Prescriptions:  .  albuterol (PROVENTIL) (2.5 MG/3ML) 0.083% nebulizer  solution, Take 2.5 mg by nebulization every 6 (six) hours as needed.  , Disp: , Rfl:  .  aspirin 81 MG tablet, Take 81 mg by mouth daily.  , Disp: , Rfl:  .  budesonide-formoterol (SYMBICORT) 160-4.5 MCG/ACT inhaler, Inhale 2 puffs into the lungs 2 (two) times daily., Disp: 1 Inhaler, Rfl: 6 .  levothyroxine (SYNTHROID, LEVOTHROID) 125 MCG tablet, Take 67.5 mcg by mouth daily before breakfast. , Disp: , Rfl:  .  nitroGLYCERIN (NITROSTAT) 0.4 MG SL tablet, Place 1 tablet (0.4 mg total) under the tongue every 5 (five) minutes as needed for chest pain.,  Disp: 20 tablet, Rfl: 0 .  pravastatin (PRAVACHOL) 40 MG tablet, Take 40 mg by mouth every evening. , Disp: , Rfl:  .  roflumilast (DALIRESP) 500 MCG TABS tablet, Take 1 tablet (500 mcg total) by mouth daily., Disp: 30 tablet, Rfl: 11 .  temazepam (RESTORIL) 30 MG capsule, Take 30 mg by mouth at bedtime as needed for sleep., Disp: , Rfl:  .  tiotropium (SPIRIVA) 18 MCG inhalation capsule, Place 1 capsule (18 mcg total) into inhaler and inhale daily., Disp: 30 capsule, Rfl: 6 .  Vitamin D, Ergocalciferol, (DRISDOL) 50000 UNITS CAPS, Take 1 tablet by mouth See admin instructions. Take 1 tablet by mouth every two weeks per patient, Disp: , Rfl:    Review of Systems     Objective:   Physical Exam  Constitutional: He is oriented to person, place, and time. He appears well-developed and well-nourished. No distress.  HENT:  Head: Normocephalic and atraumatic.  Right Ear: External ear normal.  Left Ear: External ear normal.  Mouth/Throat: Oropharynx is clear and moist. No oropharyngeal exudate.  Eyes: Conjunctivae and EOM are normal. Pupils are equal, round, and reactive to light. Right eye exhibits no discharge. Left eye exhibits no discharge. No scleral icterus.  Neck: Normal range of motion. Neck supple. No JVD present. No tracheal deviation present. No thyromegaly present.  Cardiovascular: Normal rate, regular rhythm and intact distal pulses.  Exam reveals no gallop and no friction rub.   No murmur heard. Pulmonary/Chest: Effort normal and breath sounds normal. No respiratory distress. He has no wheezes. He has no rales. He exhibits no tenderness.  Abdominal: Soft. Bowel sounds are normal. He exhibits no distension and no mass. There is no tenderness. There is no rebound and no guarding.  Musculoskeletal: Normal range of motion. He exhibits no edema or tenderness.  Lymphadenopathy:    He has no cervical adenopathy.  Neurological: He is alert and oriented to person, place, and time. He has  normal reflexes. No cranial nerve deficit. Coordination normal.  Skin: Skin is warm and dry. No rash noted. He is not diaphoretic. No erythema. No pallor.  Psychiatric: He has a normal mood and affect. His behavior is normal. Judgment and thought content normal.  Nursing note and vitals reviewed.  Vitals:   01/27/17 1047  BP: 126/86  Pulse: 73  SpO2: 96%  Weight: 209 lb (94.8 kg)  Height: '5\' 10"'$  (1.778 m)    Estimated body mass index is 29.99 kg/m as calculated from the following:   Height as of this encounter: '5\' 10"'$  (1.778 m).   Weight as of this encounter: 209 lb (94.8 kg).     Assessment:       ICD-9-CM ICD-10-CM   1. Moderate COPD (chronic obstructive pulmonary disease) ([place]) 496 J44.9        Plan:       Stable disease  Plan - -  Continue COPD medications as before Spiriva and Symbicort  - rememember to take daliresp to see if this will preent copd flareups - Albuterol as needed   Follow-up - 6 months or sooner if needed     Dr. Brand Males, M.D., [facility].C.P Pulmonary and Critical Care Medicine Staff Physician [facility] Pager: [phone], If no answer or between  15:00h - 7:00h: call [phone]  01/27/2017 11:09 AM

## 2017-01-27 NOTE — Patient Instructions (Addendum)
ICD-9-CM ICD-10-CM   1. Moderate COPD (chronic obstructive pulmonary disease) ([facility]) 496 J44.9      Stable disease  Plan - - Continue COPD medications as before Spiriva and Symbicort  - rememember to take daliresp to see if this will preent copd flareups - Albuterol as needed   Follow-up - 6 months or sooner if needed

## 2017-06-09 ENCOUNTER — Other Ambulatory Visit (HOSPITAL_BASED_OUTPATIENT_CLINIC_OR_DEPARTMENT_OTHER): Payer: Commercial Managed Care - HMO

## 2017-06-09 ENCOUNTER — Ambulatory Visit (HOSPITAL_COMMUNITY)
Admission: RE | Admit: 2017-06-09 | Discharge: 2017-06-09 | Disposition: A | Discharge status: Home / Self Care | Payer: Commercial Managed Care - HMO | Source: Ambulatory Visit | Attending: Internal Medicine | Admitting: Internal Medicine

## 2017-06-09 DIAGNOSIS — C3491 Malignant neoplasm of unspecified part of right bronchus or lung: Secondary | ICD-10-CM | POA: Diagnosis present

## 2017-06-09 DIAGNOSIS — K8020 Calculus of gallbladder without cholecystitis without obstruction: Secondary | ICD-10-CM | POA: Insufficient documentation

## 2017-06-09 DIAGNOSIS — K760 Fatty (change of) liver, not elsewhere classified: Secondary | ICD-10-CM | POA: Insufficient documentation

## 2017-06-09 DIAGNOSIS — I700 Atherosclerosis of aorta: Secondary | ICD-10-CM | POA: Insufficient documentation

## 2017-06-09 DIAGNOSIS — J439 Emphysema, unspecified: Secondary | ICD-10-CM | POA: Insufficient documentation

## 2017-06-09 LAB — CBC WITH DIFFERENTIAL/PLATELET
BASO%: 0.9 % (ref 0.0–2.0)
Basophils Absolute: 0.1 10*3/uL (ref 0.0–0.1)
EOS%: 2.9 % (ref 0.0–7.0)
Eosinophils Absolute: 0.2 10*3/uL (ref 0.0–0.5)
HEMATOCRIT: 44.8 % (ref 38.4–49.9)
HEMOGLOBIN: 15.3 g/dL (ref 13.0–17.1)
LYMPH#: 0.8 10*3/uL — AB (ref 0.9–3.3)
LYMPH%: 12.1 % — ABNORMAL LOW (ref 14.0–49.0)
MCH: 28.8 pg (ref 27.2–33.4)
MCHC: 34.2 g/dL (ref 32.0–36.0)
MCV: 84.2 fL (ref 79.3–98.0)
MONO#: 0.6 10*3/uL (ref 0.1–0.9)
MONO%: 9.1 % (ref 0.0–14.0)
NEUT%: 75 % (ref 39.0–75.0)
NEUTROS ABS: 5.1 10*3/uL (ref 1.5–6.5)
PLATELETS: 168 10*3/uL (ref 140–400)
RBC: 5.32 10*6/uL (ref 4.20–5.82)
RDW: 13.6 % (ref 11.0–14.6)
WBC: 6.8 10*3/uL (ref 4.0–10.3)

## 2017-06-09 LAB — COMPREHENSIVE METABOLIC PANEL
ALBUMIN: 4.1 g/dL (ref 3.5–5.0)
ALT: 16 U/L (ref 0–55)
ANION GAP: 7 meq/L (ref 3–11)
AST: 19 U/L (ref 5–34)
Alkaline Phosphatase: 115 U/L (ref 40–150)
BILIRUBIN TOTAL: 0.89 mg/dL (ref 0.20–1.20)
BUN: 16 mg/dL (ref 7.0–26.0)
CALCIUM: 9.8 mg/dL (ref 8.4–10.4)
CO2: 28 meq/L (ref 22–29)
CREATININE: 1 mg/dL (ref 0.7–1.3)
Chloride: 106 mEq/L (ref 98–109)
EGFR: 83 mL/min/{1.73_m2} — ABNORMAL LOW (ref >90)
Glucose: 97 mg/dl (ref 70–140)
Potassium: 4 mEq/L (ref 3.5–5.1)
Sodium: 140 mEq/L (ref 136–145)
TOTAL PROTEIN: 7.5 g/dL (ref 6.4–8.3)

## 2017-06-09 MED ORDER — IOPAMIDOL (ISOVUE-300) INJECTION 61%
INTRAVENOUS | Status: AC
  Filled 2017-06-09: qty 75

## 2017-06-09 MED ORDER — IOPAMIDOL (ISOVUE-300) INJECTION 61%
75.0000 mL | Freq: Once | INTRAVENOUS | Status: AC | PRN
  Administered 2017-06-09: 75 mL via INTRAVENOUS

## 2017-06-16 ENCOUNTER — Ambulatory Visit (HOSPITAL_BASED_OUTPATIENT_CLINIC_OR_DEPARTMENT_OTHER): Payer: Commercial Managed Care - HMO | Admitting: Internal Medicine

## 2017-06-16 ENCOUNTER — Encounter: Payer: Self-pay | Admitting: Internal Medicine

## 2017-06-16 ENCOUNTER — Telehealth: Payer: Self-pay | Admitting: Internal Medicine

## 2017-06-16 VITALS — BP 130/82 | HR 61 | Temp 98.1°F | Resp 18 | Ht 70.0 in | Wt 197.9 lb

## 2017-06-16 DIAGNOSIS — Z85118 Personal history of other malignant neoplasm of bronchus and lung: Secondary | ICD-10-CM | POA: Diagnosis not present

## 2017-06-16 DIAGNOSIS — E039 Hypothyroidism, unspecified: Secondary | ICD-10-CM | POA: Diagnosis not present

## 2017-06-16 DIAGNOSIS — C3491 Malignant neoplasm of unspecified part of right bronchus or lung: Secondary | ICD-10-CM

## 2017-06-16 NOTE — Telephone Encounter (Signed)
Per 6/18 los - No need for follow-up visit.

## 2017-06-16 NOTE — Progress Notes (Signed)
[place] Telephone:([phone]) [phone]   Fax:([phone]) [phone]  OFFICE PROGRESS NOTE  [name], MD [place] [address]  DIAGNOSIS:  1. Recurrent non-small cell lung cancer, presented with endobronchial lesion involving the right main stem bronchus diagnosed in March 2007.  2. History of stage IB non-small cell lung cancer diagnosed in May 2006.  3. 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  PRIOR THERAPY: 1. Status post right upper lobectomy on May 01, 2005 under the care of Dr. Arlyce Dice.  2. Status post laryngoscopy with CO2 laser excision of the right vocal cord lesion under the care of Dr. Redmond Baseman in March 2007.  3. Status post concurrent chemoradiation with weekly carboplatin and paclitaxel. Last dose was given May 12, 2006.  4. Status post 3 cycles of consolidation chemotherapy with docetaxel. Last dose was given August 21, 2006.  CURRENT THERAPY: Observation.  INTERVAL HISTORY: Stephen [name] 61 y.o. male returns to the clinic today for follow-up visit. The patient is feeling fine today with no specific complaints. He denied having any chest pain, shortness of breath, cough or hemoptysis. He has no nausea, vomiting, diarrhea or constipation. Has been observation since August 2007 with no concerning findings. He had repeat CT scan of the chest performed recently and he is here for evaluation and discussion of his scan results.  MEDICAL HISTORY: Past Medical History:  Diagnosis Date  . Allergic rhinitis, cause unspecified   . Blindness of left eye   . Dyspnea   . Hypothyroidism   . Lung cancer ([place])    lung ca dx 06  . Osteoporosis   . Other emphysema ([place])    copd  . Throat cancer ([place])    throat ca dx 2007  . Thrombosed hemorrhoids   . Tubular adenoma of colon 2009  . Unspecified vitamin D deficiency     ALLERGIES:  is allergic to codeine; cymbalta [duloxetine hcl]; and montelukast  sodium.  MEDICATIONS:  Current Outpatient Prescriptions  Medication Sig Dispense Refill  . albuterol (PROVENTIL) (2.5 MG/3ML) 0.083% nebulizer solution Take 2.5 mg by nebulization every 6 (six) hours as needed.      Marland Kitchen aspirin 81 MG tablet Take 81 mg by mouth daily.      . budesonide-formoterol (SYMBICORT) 160-4.5 MCG/ACT inhaler Inhale 2 puffs into the lungs 2 (two) times daily. 1 Inhaler 6  . levothyroxine (SYNTHROID, LEVOTHROID) 125 MCG tablet Take 67.5 mcg by mouth daily before breakfast.     . nitroGLYCERIN (NITROSTAT) 0.4 MG SL tablet Place 1 tablet (0.4 mg total) under the tongue every 5 (five) minutes as needed for chest pain. 20 tablet 0  . pravastatin (PRAVACHOL) 40 MG tablet Take 40 mg by mouth every evening.     . roflumilast (DALIRESP) 500 MCG TABS tablet Take 1 tablet (500 mcg total) by mouth daily. 30 tablet 11  . temazepam (RESTORIL) 30 MG capsule Take 30 mg by mouth at bedtime as needed for sleep.    Marland Kitchen tiotropium (SPIRIVA) 18 MCG inhalation capsule Place 1 capsule (18 mcg total) into inhaler and inhale daily. 30 capsule 6  . Vitamin D, Ergocalciferol, (DRISDOL) 50000 UNITS CAPS Take 1 tablet by mouth See admin instructions. Take 1 tablet by mouth every two weeks per patient     No current facility-administered medications for this visit.     SURGICAL HISTORY:  Past Surgical History:  Procedure Laterality Date  . APPENDECTOMY    .  KNEE SURGERY    . LUNG LOBECTOMY     RUL  . ROTATOR CUFF REPAIR    . SHOULDER SURGERY    . THROAT SURGERY     Laser surgery for throat cancer  . TRANSTHORACIC ECHOCARDIOGRAM  01/13/2012   EF=>55%; trace MR/TR;     REVIEW OF SYSTEMS:  A comprehensive review of systems was negative except for: Respiratory: positive for dyspnea on exertion   PHYSICAL EXAMINATION: General appearance: alert, cooperative and no distress Head: Normocephalic, without obvious abnormality, atraumatic Neck: no adenopathy, no JVD, supple, symmetrical, trachea midline  and thyroid not enlarged, symmetric, no tenderness/mass/nodules Lymph nodes: Cervical, supraclavicular, and axillary nodes normal. Resp: clear to auscultation bilaterally Back: symmetric, no curvature. ROM normal. No CVA tenderness. Cardio: regular rate and rhythm, S1, S2 normal, no murmur, click, rub or gallop GI: soft, non-tender; bowel sounds normal; no masses,  no organomegaly Extremities: extremities normal, atraumatic, no cyanosis or edema  ECOG PERFORMANCE STATUS: 1 - Symptomatic but completely ambulatory  Blood pressure 130/82, pulse 61, temperature 98.1 F (36.7 C), temperature source Oral, resp. rate 18, height 5\' 10"  (1.778 m), weight 197 lb 14.4 oz (89.8 kg), SpO2 97 %.  LABORATORY DATA: Lab Results  Component Value Date   WBC 6.8 06/09/2017   HGB 15.3 06/09/2017   HCT 44.8 06/09/2017   MCV 84.2 06/09/2017   PLT 168 06/09/2017      Chemistry      Component Value Date/Time   NA 140 06/09/2017 1116   K 4.0 06/09/2017 1116   CL 104 05/30/2016 1316   CL 106 09/14/2012 0808   CO2 28 06/09/2017 1116   BUN 16.0 06/09/2017 1116   CREATININE 1.0 06/09/2017 1116      Component Value Date/Time   CALCIUM 9.8 06/09/2017 1116   ALKPHOS 115 06/09/2017 1116   AST 19 06/09/2017 1116   ALT 16 06/09/2017 1116   BILITOT 0.89 06/09/2017 1116       RADIOGRAPHIC STUDIES: Ct Chest W Contrast  Result Date: 06/09/2017 CLINICAL DATA:  61 year old male with history of right-sided lung cancer diagnosed in 2006 treated with right upper lobectomy, follow by chemotherapy and radiation therapy. Throat cancer diagnosed in 2007 treated with surgical resection. Congestion and respiratory infection since March 2018. EXAM: CT CHEST WITH CONTRAST TECHNIQUE: Multidetector CT imaging of the chest was performed during intravenous contrast administration. CONTRAST:  73mL ISOVUE-300 IOPAMIDOL (ISOVUE-300) INJECTION 61% COMPARISON:  Chest CT 06/04/2016. FINDINGS: Cardiovascular: Heart size is normal.  There is no significant pericardial fluid, thickening or pericardial calcification. Mediastinum/Nodes: No pathologically enlarged mediastinal or hilar lymph nodes. Esophagus is unremarkable in appearance. No axillary lymphadenopathy. Lungs/Pleura: Status post right upper lobectomy. Compensatory hyperexpansion of the right middle and lower lobes. Extensive chronic architectural distortion throughout the upper right hemithorax where there is a chronic mass-like opacity in the apex which is similar to prior studies, presumably an area of postradiation mass-like fibrosis. No new suspicious appearing pulmonary nodules or masses are noted. No acute consolidative airspace disease. No pleural effusions. Diffuse bronchial wall thickening with moderate centrilobular and paraseptal emphysema. Upper Abdomen: Diffuse low attenuation throughout the visualized hepatic parenchyma, compatible with hepatic steatosis. Calcified gallstone in the neck of the gallbladder. Musculoskeletal: Postthoracotomy changes in the right chest wall. There are no aggressive appearing lytic or blastic lesions noted in the visualized portions of the skeleton. IMPRESSION: 1. Chronic postoperative and postradiation changes redemonstrated, as above. No definite evidence to suggest local recurrence of disease or metastatic disease in the  thorax. 2. Diffuse bronchial wall thickening with moderate centrilobular and paraseptal emphysema; imaging findings suggestive of underlying COPD. 3. Hepatic steatosis. 4. Cholelithiasis. Aortic Atherosclerosis (ICD10-I70.0) and Emphysema (ICD10-J43.9). Electronically Signed   By: Vinnie Langton M.D.   On: 06/09/2017 14:21    ASSESSMENT AND PLAN:  This is a very pleasant 61 years old white male with recurrent non-small cell lung cancer diagnosed in March 2007 as well as squamous cell carcinoma of the focal cords status post concurrent chemoradiation and consolidation chemotherapy. He has been on observation since  August 2007 with no evidence for disease recurrence. The patient had repeat CT scan of the chest performed recently. The CT scan showed no evidence for disease recurrence. I discussed the scan results with the patient today and recommended for him to continue on observation with routine follow-up visit by his primary care physician and pulmonologist. I will see the patient on as-needed basis at this point since it has been more than 10 years since his last treatment. He was advised to call if he has any concerning symptoms. The patient voices understanding of current disease status and treatment options and is in agreement with the current care plan. All questions were answered. The patient knows to call the clinic with any problems, questions or concerns. We can certainly see the patient much sooner if necessary. I spent 10 minutes counseling the patient face to face. The total time spent in the appointment was 15 minutes.  Disclaimer: This note was dictated with voice recognition software. Similar sounding words can inadvertently be transcribed and may not be corrected upon review.

## 2017-07-25 ENCOUNTER — Ambulatory Visit (INDEPENDENT_AMBULATORY_CARE_PROVIDER_SITE_OTHER): Payer: 59 | Admitting: Internal Medicine

## 2017-07-25 ENCOUNTER — Other Ambulatory Visit (INDEPENDENT_AMBULATORY_CARE_PROVIDER_SITE_OTHER): Payer: 59

## 2017-07-25 ENCOUNTER — Encounter: Payer: Self-pay | Admitting: Internal Medicine

## 2017-07-25 VITALS — BP 116/66 | HR 71 | Ht 70.0 in | Wt 201.0 lb

## 2017-07-25 DIAGNOSIS — J0191 Acute recurrent sinusitis, unspecified: Secondary | ICD-10-CM

## 2017-07-25 DIAGNOSIS — J441 Chronic obstructive pulmonary disease with (acute) exacerbation: Secondary | ICD-10-CM | POA: Diagnosis not present

## 2017-07-25 DIAGNOSIS — J449 Chronic obstructive pulmonary disease, unspecified: Secondary | ICD-10-CM | POA: Diagnosis not present

## 2017-07-25 LAB — CBC WITH DIFFERENTIAL/PLATELET
BASOS ABS: 0.1 10*3/uL (ref 0.0–0.1)
BASOS PCT: 1 % (ref 0.0–3.0)
Eosinophils Absolute: 0.2 10*3/uL (ref 0.0–0.7)
Eosinophils Relative: 3.5 % (ref 0.0–5.0)
HEMATOCRIT: 45.2 % (ref 39.0–52.0)
HEMOGLOBIN: 15.1 g/dL (ref 13.0–17.0)
LYMPHS PCT: 13.5 % (ref 12.0–46.0)
Lymphs Abs: 1 10*3/uL (ref 0.7–4.0)
MCHC: 33.4 g/dL (ref 30.0–36.0)
MCV: 86.7 fl (ref 78.0–100.0)
MONOS PCT: 10 % (ref 3.0–12.0)
Monocytes Absolute: 0.7 10*3/uL (ref 0.1–1.0)
NEUTROS ABS: 5.1 10*3/uL (ref 1.4–7.7)
Neutrophils Relative %: 72 % (ref 43.0–77.0)
PLATELETS: 159 10*3/uL (ref 150.0–400.0)
RBC: 5.22 Mil/uL (ref 4.22–5.81)
RDW: 13.1 % (ref 11.5–15.5)
WBC: 7.1 10*3/uL (ref 4.0–10.5)

## 2017-07-25 MED ORDER — CEPHALEXIN 500 MG PO CAPS: 500.0000 mg | ORAL_CAPSULE | Freq: Three times a day (TID) | ORAL | 0 refills | Status: DC

## 2017-07-25 MED ORDER — PREDNISONE 10 MG PO TABS: ORAL_TABLET | ORAL | 0 refills | Status: DC

## 2017-07-25 NOTE — Patient Instructions (Addendum)
ICD-10-CM   1. COPD with acute exacerbation ([place]) J44.1   2. Moderate COPD (chronic obstructive pulmonary disease) ([facility]) J44.9   3. 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

## 2017-07-25 NOTE — Progress Notes (Addendum)
Subjective:     Patient ID: Stephen [name], male   DOB: 05/07/1956, 61 y.o.   MRN: [id number]  HPI    OV 01/27/2017  Chief Complaint  Patient presents with  . Follow-up    Breathing is overall doing well. No new co's today. He     Follow-up moderate to severe COPD with a history of recurrent exacerbations and MZ phenotype  Since his last visit he continues to be stable. There are no new exacerbations. He did see Dr. Julien Nordmann oncologist 12/09/2016. I reviewed that note and summarized the fact that he has no recurrence and a follow-up CT chest is planned for June 2018. Most recent CT chest is from June 2017 and this is reviewed below. I did not visualize this film. For his recurrent exacerbations we did discuss alpha-1 replacement but we opted to take Daliresp. However he tells me that he is mostly noncompliant with it. He is willing to retry this. He is up-to-date with his flu shot.  CT chest June 2017: IMPRESSION: 1. No significant change in treatment effects within the right hemi thorax, including right upper lobectomy and presumed radiation induced consolidation at the right apex. 2. centrilobular emphysema, without other explanation for patient's symptoms. 3. Similar right middle lobe pulmonary nodule, favoring a benign etiology. 4. Cholelithiasis.   Electronically Signed   By: Abigail Miyamoto M.D.   On: 06/04/2016 09:33   OV 07/25/2017  Chief Complaint  Patient presents with  . Follow-up    pt c/o "URI" since 03/19/17.  pt c/o sob with exertion, prod cough with green/yellow mucus, fatigue.     Moderate COPD with recurrent exacerbation history MZ phenotype in the setting of lung cancer status post lobectomy  June 2018 he did have a CT scan that showed continued remission. Apparently been discharged from oncology follow-up. However for the last few months he's having recurrent exacerbations associated with sinusitis. He feels roflumilast did not help him. He is compliant  with the Spiriva and Symbicort. He is frustrated by his recurrent exacerbations. He feels it is a sinus causing these issues. Last CT scan of the sinus was in 2008. He says that when he was in allergy shots with Dr. Donneta Romberg symptoms were better. When he came off the allergy shots to go on the [place] which actually didn't help him. He was better while he was in the trial and is interested int rying Trelegy  Noted: When he walked out of the office he saw RN Alethia Berthold and made comments, "hey married girl are you stil married?". I advised him to mind his language and maintain decorum. He replied, "Ok but then aadded "these days we cannot peek and play". This is not the first time he has made comments of this nature on this RN. In fact is for this very reason he was largely worked up by another [employer]     CAT COPD Symptom & Quality of Life Score ([place] trademark) 0 is no burden. 5 is highest burden 07/25/2017   Never Cough -> Cough all the time 4  No phlegm in chest -> Chest is full of phlegm 3  No chest tightness -> Chest feels very tight 3  No dyspnea for 1 flight stairs/hill -> Very dyspneic for 1 flight of stairs 4  No limitations for ADL at home -> Very limited with ADL at home 5  Confident leaving home -> Not at all confident leaving home 0  Sleep soundly ->  Do not sleep soundly because of lung condition 3  Lots of Energy -> No energy at all 4  TOTAL Score (max 40)  26      IMPRESSION: 1. Chronic postoperative and postradiation changes redemonstrated, as above. No definite evidence to suggest local recurrence of disease or metastatic disease in the thorax. 2. Diffuse bronchial wall thickening with moderate centrilobular and paraseptal emphysema; imaging findings suggestive of underlying COPD. 3. Hepatic steatosis. 4. Cholelithiasis.  Aortic Atherosclerosis (ICD10-I70.0) and Emphysema (ICD10-J43.9).   Electronically Signed   By: Vinnie Langton M.D.    On: 06/09/2017 14:21    has a past medical history of Allergic rhinitis, cause unspecified; Blindness of left eye; Dyspnea; Hypothyroidism; Lung cancer ([place]); Osteoporosis; Other emphysema ([place]); Throat cancer ([place]); Thrombosed hemorrhoids; Tubular adenoma of colon (2009); and Unspecified vitamin D deficiency.   reports that he quit smoking about 12 years ago. His smoking use included Cigarettes. He has a 48.00 pack-year smoking history. He has never used smokeless tobacco.  Past Surgical History:  Procedure Laterality Date  . APPENDECTOMY    . KNEE SURGERY    . LUNG LOBECTOMY     RUL  . ROTATOR CUFF REPAIR    . SHOULDER SURGERY    . THROAT SURGERY     Laser surgery for throat cancer  . TRANSTHORACIC ECHOCARDIOGRAM  01/13/2012   EF=>55%; trace MR/TR;     Allergies  Allergen Reactions  . Codeine Other (See Comments)    unknown  . Cymbalta [Duloxetine Hcl] Nausea And Vomiting  . Montelukast Sodium Other (See Comments)    Causes sinusitis    Immunization History  Administered Date(s) Administered  . Influenza Split 08/30/2013, 09/08/2014, 09/06/2015  . Influenza Whole 10/01/2010, 10/02/2011, 09/29/2016  . Pneumococcal Conjugate-13 09/08/2014  . Pneumococcal Polysaccharide-23 10/30/2009  . Zoster 09/09/2016    Family History  Problem Relation Age of Onset  . Cancer Mother        Brain tumor  . COPD Mother   . Heart disease Father        CABG  . Prostate cancer Father   . Cystic fibrosis Sister        also heart failure  . Mental illness Brother   . Heart failure Paternal Grandmother   . Colon cancer Neg Hx   . Colon polyps Neg Hx   . Esophageal cancer Neg Hx      Current Outpatient Prescriptions:  .  albuterol (PROVENTIL) (2.5 MG/3ML) 0.083% nebulizer solution, Take 2.5 mg by nebulization every 6 (six) hours as needed.  , Disp: , Rfl:  .  aspirin 81 MG tablet, Take 81 mg by mouth daily.  , Disp: , Rfl:  .  budesonide-formoterol (SYMBICORT) 160-4.5 MCG/ACT  inhaler, Inhale 2 puffs into the lungs 2 (two) times daily., Disp: 1 Inhaler, Rfl: 6 .  levothyroxine (SYNTHROID, LEVOTHROID) 125 MCG tablet, Take 67.5 mcg by mouth daily before breakfast. , Disp: , Rfl:  .  nitroGLYCERIN (NITROSTAT) 0.4 MG SL tablet, Place 1 tablet (0.4 mg total) under the tongue every 5 (five) minutes as needed for chest pain., Disp: 20 tablet, Rfl: 0 .  pravastatin (PRAVACHOL) 40 MG tablet, Take 40 mg by mouth every evening. , Disp: , Rfl:  .  temazepam (RESTORIL) 30 MG capsule, Take 30 mg by mouth at bedtime as needed for sleep., Disp: , Rfl:  .  tiotropium (SPIRIVA) 18 MCG inhalation capsule, Place 1 capsule (18 mcg total) into inhaler and inhale daily., Disp: 30 capsule, Rfl:  6 .  Vitamin D, Ergocalciferol, (DRISDOL) 50000 UNITS CAPS, Take 1 tablet by mouth See admin instructions. Take 1 tablet by mouth every two weeks per patient, Disp: , Rfl:      Review of Systems     Objective:   Physical Exam  Constitutional: He is oriented to person, place, and time. He appears well-developed and well-nourished. No distress.  HENT:  Head: Normocephalic and atraumatic.  Right Ear: External ear normal.  Left Ear: External ear normal.  Mouth/Throat: Oropharynx is clear and moist. No oropharyngeal exudate.  Eyes: Pupils are equal, round, and reactive to light. Conjunctivae and EOM are normal. Right eye exhibits no discharge. Left eye exhibits no discharge. No scleral icterus.  Neck: Normal range of motion. Neck supple. No JVD present. No tracheal deviation present. No thyromegaly present.  Cardiovascular: Normal rate, regular rhythm and intact distal pulses.  Exam reveals no gallop and no friction rub.   No murmur heard. Pulmonary/Chest: Effort normal. No respiratory distress. He has wheezes. He has no rales. He exhibits no tenderness.  Abdominal: Soft. Bowel sounds are normal. He exhibits no distension and no mass. There is no tenderness. There is no rebound and no guarding.   Musculoskeletal: Normal range of motion. He exhibits no edema or tenderness.  Lymphadenopathy:    He has no cervical adenopathy.  Neurological: He is alert and oriented to person, place, and time. He has normal reflexes. No cranial nerve deficit. Coordination normal.  Skin: Skin is warm and dry. No rash noted. He is not diaphoretic. No erythema. No pallor.  Psychiatric: He has a normal mood and affect. His behavior is normal. Judgment and thought content normal.  Nursing note and vitals reviewed.   Vitals:   07/25/17 1005  BP: 116/66  Pulse: 71  SpO2: 97%  Weight: 201 lb (91.2 kg)  Height: 5\' 10"  (1.778 m)    Estimated body mass index is 28.84 kg/m as calculated from the following:   Height as of this encounter: 5\' 10"  (1.778 m).   Weight as of this encounter: 201 lb (91.2 kg).      Assessment:       ICD-10-CM   1. COPD with acute exacerbation ([place]) J44.1   2. Moderate COPD (chronic obstructive pulmonary disease) ([facility]) J44.9   3. 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 Brand Males, M.D., F.C.C.P Pulmonary and Critical Care Medicine Staff Physician [place] [facility] Pager: [phone], If no answer or between  15:00h - 7:00h: call [phone]  07/25/2017 10:40 AM

## 2017-07-28 ENCOUNTER — Telehealth: Payer: Self-pay | Admitting: Internal Medicine

## 2017-07-28 LAB — IGE: IGE (IMMUNOGLOBULIN E), SERUM: 6 kU/L (ref <115)

## 2017-07-28 NOTE — Telephone Encounter (Signed)
Both IgE and eos are normal. So he is not a candidate for biologics. Recommend if he is interesetd in allergy shots to d/w Dr Donneta Romberg  Dr. Brand Males, M.D., [facility].C.P Pulmonary and Critical Care Medicine Staff Physician [place] [facility] Pager: [phone], If no answer or between  15:00h - 7:00h: call [phone]  07/28/2017 11:28 PM

## 2017-08-01 ENCOUNTER — Inpatient Hospital Stay: Admission: RE | Admit: 2017-08-01 | Payer: 59 | Source: Ambulatory Visit

## 2017-08-04 NOTE — Telephone Encounter (Signed)
lmtcb for pt.  

## 2017-08-12 NOTE — Telephone Encounter (Signed)
lmtcb for pt.  

## 2017-08-13 ENCOUNTER — Other Ambulatory Visit: Payer: Self-pay

## 2017-08-13 MED ORDER — FLUTICASONE-UMECLIDIN-VILANT 100-62.5-25 MCG/INH IN AEPB: 1.0000 | INHALATION_SPRAY | Freq: Every day | RESPIRATORY_TRACT | 2 refills | Status: DC

## 2017-08-13 NOTE — Telephone Encounter (Signed)
Patient returned call, CB is [phone].

## 2017-08-13 NOTE — Telephone Encounter (Signed)
Spoke with pt, aware of results/recs.  Pt wishes to consider referral to Dr. Donneta Romberg and will let us know.   Pt also requesting rx of Trelegy to be sent to pharmacy.  Pt was given sample at last ov per note.  rx has been sent.  Nothing further needed.

## 2017-08-27 ENCOUNTER — Telehealth: Payer: Self-pay | Admitting: Internal Medicine

## 2017-08-27 NOTE — Telephone Encounter (Signed)
Got note 07/29/17 that he held off on getting sinus ct. Will address when I follow with him again

## 2017-09-03 ENCOUNTER — Telehealth: Payer: Self-pay | Admitting: Internal Medicine

## 2017-09-03 NOTE — Telephone Encounter (Signed)
PA initiated via [email or web address] for the trelegy.  Paper placed in the purple folder.  Will forward to EE to follow up on.

## 2017-09-05 ENCOUNTER — Ambulatory Visit: Payer: 59 | Admitting: Adult Health

## 2017-09-11 NOTE — Telephone Encounter (Signed)
More information sent to plan for PA on 09/11/17. Will continue to check PA throughout the week for outcome.

## 2017-12-12 DIAGNOSIS — R490 Dysphonia: Secondary | ICD-10-CM | POA: Insufficient documentation

## 2017-12-18 ENCOUNTER — Encounter (HOSPITAL_COMMUNITY): Payer: Self-pay | Admitting: *Deleted

## 2017-12-18 ENCOUNTER — Other Ambulatory Visit: Payer: Self-pay | Admitting: Otolaryngology

## 2017-12-18 ENCOUNTER — Other Ambulatory Visit: Payer: Self-pay

## 2017-12-18 NOTE — Progress Notes (Signed)
Mr Stephen [name] has a history of COPD, he reports that he is always short of breath, '"no more than usual." Mr Stephen [name] reports that he had "chest pian a few days ago."  We were walking in [place] and I felt a pain in my Left chest, not close to nipple but ribs."  Patient said that he stopped walking and put his finger on it and the pain went away in a minute or so. Patient rated the pain a 1 or 2 just , "it wasn't sharp". Patient reports that he has never had this pain before and he has not notified a doctor.  I faxed a request to his PCP's office,Dr Perinni's requesting last last office notes, labs, EKG and any cardiac studies.  I asked PA/NT for anesthesia to review information.

## 2017-12-18 NOTE — Progress Notes (Signed)
Anesthesia Chart Review:  Pt is a same day work up  Pt is a 61 year old male scheduled for microlaryngoscopy with CO2 laser and excision of vocal cord lesion on 12/19/2017 with Melida Quitter, MD  - PCP is [name], MD - Pulmonologist is Brand Males, MD - Oncologist is Curt Bears, MD  PMH includes:  COPD, throat cancer, lung cancer (s/p lobectomy), hypothyroidism, GERD. Former smoker. Blind in L eye  Medications include: Albuterol, ASA 81 mg, trelegy Ellipta, levothyroxine, pravastatin  Labs will be obtained day of surgery.   CT chest 06/09/17:  1. Chronic postoperative and postradiation changes redemonstrated, as above. No definite evidence to suggest local recurrence of disease or metastatic disease in the thorax. 2. Diffuse bronchial wall thickening with moderate centrilobular and paraseptal emphysema; imaging findings suggestive of underlying COPD. 3. Hepatic steatosis. 4. Cholelithiasis.  EKG will be obtained day of surgery  Nuclear stress test 08/17/13:  - Low risk stress nuclear study with bowel artifact obscuring the inferoseptal wall.  No evidence of ischemia or infarction.. - LV Wall Motion:  NL LV Function; NL Wall Motion  Echo 08/09/13:  - Left ventricle: The cavity size was normal. Systolic function was normal. The estimated ejection fraction was in the range of 55% to 60%. Wall motion was normal; there were no regional wall motion abnormalities.  I spoke to pt by telephone.  He reports 1 episode of chest pain yesterday while walking around [place].  Left lateral chest, under arm, lateral to nipple.  Pt states it felt "sore".  Put his thumb on it and it was "tender".  Pt feels like it was rib soreness.  Denies dizziness, nausea, diaphoresis; pain did not radiate.  Was mild, rates as 1-2 out of 10 and lasted a minute or two.  Has never had before or since.  Pt did not have change in usual SOB symptoms with it (he reports he is always SOB, it is his baseline).  I  think it unlikely this was cardiac.   If labs and EKG acceptable day of surgery, I anticipate pt can proceed with surgery as scheduled.   Willeen Cass, FNP-BC [facility]/Anesthesiology Phone: [phone] 12/18/2017 2:38 PM

## 2017-12-19 ENCOUNTER — Ambulatory Visit (HOSPITAL_COMMUNITY): Admission: RE | Admit: 2017-12-19 | Payer: 59 | Source: Ambulatory Visit | Admitting: Otolaryngology

## 2017-12-19 ENCOUNTER — Encounter (HOSPITAL_COMMUNITY)
Admission: RE | Disposition: A | Discharge status: Home / Self Care | Payer: Self-pay | Source: Ambulatory Visit | Attending: Otolaryngology

## 2017-12-19 ENCOUNTER — Ambulatory Visit (HOSPITAL_COMMUNITY): Payer: 59 | Admitting: Critical Care Medicine

## 2017-12-19 ENCOUNTER — Encounter (HOSPITAL_COMMUNITY): Admission: RE | Payer: Self-pay | Source: Ambulatory Visit

## 2017-12-19 ENCOUNTER — Ambulatory Visit (HOSPITAL_COMMUNITY)
Admission: RE | Admit: 2017-12-19 | Discharge: 2017-12-19 | Disposition: A | Discharge status: Home / Self Care | Payer: 59 | Source: Ambulatory Visit | Attending: Otolaryngology | Admitting: Otolaryngology

## 2017-12-19 DIAGNOSIS — E559 Vitamin D deficiency, unspecified: Secondary | ICD-10-CM | POA: Diagnosis not present

## 2017-12-19 DIAGNOSIS — Z87891 Personal history of nicotine dependence: Secondary | ICD-10-CM | POA: Insufficient documentation

## 2017-12-19 DIAGNOSIS — J449 Chronic obstructive pulmonary disease, unspecified: Secondary | ICD-10-CM | POA: Diagnosis not present

## 2017-12-19 DIAGNOSIS — F419 Anxiety disorder, unspecified: Secondary | ICD-10-CM | POA: Diagnosis not present

## 2017-12-19 DIAGNOSIS — Z79899 Other long term (current) drug therapy: Secondary | ICD-10-CM | POA: Insufficient documentation

## 2017-12-19 DIAGNOSIS — K219 Gastro-esophageal reflux disease without esophagitis: Secondary | ICD-10-CM | POA: Insufficient documentation

## 2017-12-19 DIAGNOSIS — C320 Malignant neoplasm of glottis: Secondary | ICD-10-CM | POA: Insufficient documentation

## 2017-12-19 DIAGNOSIS — E039 Hypothyroidism, unspecified: Secondary | ICD-10-CM | POA: Insufficient documentation

## 2017-12-19 DIAGNOSIS — F329 Major depressive disorder, single episode, unspecified: Secondary | ICD-10-CM | POA: Diagnosis not present

## 2017-12-19 DIAGNOSIS — Z85118 Personal history of other malignant neoplasm of bronchus and lung: Secondary | ICD-10-CM | POA: Insufficient documentation

## 2017-12-19 DIAGNOSIS — Z7982 Long term (current) use of aspirin: Secondary | ICD-10-CM | POA: Insufficient documentation

## 2017-12-19 HISTORY — DX: Major depressive disorder, single episode, unspecified: F32.9

## 2017-12-19 HISTORY — DX: Anxiety disorder, unspecified: F41.9

## 2017-12-19 HISTORY — DX: Gastro-esophageal reflux disease without esophagitis: K21.9

## 2017-12-19 HISTORY — DX: Other complications of anesthesia, initial encounter: T88.59XA

## 2017-12-19 HISTORY — DX: Adverse effect of unspecified anesthetic, initial encounter: T41.45XA

## 2017-12-19 HISTORY — DX: Depression, unspecified: F32.A

## 2017-12-19 HISTORY — PX: MICROLARYNGOSCOPY WITH CO2 LASER AND EXCISION OF VOCAL CORD LESION: SHX5970

## 2017-12-19 LAB — BASIC METABOLIC PANEL
ANION GAP: 6 (ref 5–15)
BUN: 12 mg/dL (ref 6–20)
CALCIUM: 9 mg/dL (ref 8.9–10.3)
CHLORIDE: 109 mmol/L (ref 101–111)
CO2: 26 mmol/L (ref 22–32)
Creatinine, Ser: 0.94 mg/dL (ref 0.61–1.24)
GFR calc non Af Amer: >60 mL/min (ref >60)
GLUCOSE: 100 mg/dL — AB (ref 65–99)
POTASSIUM: 3.8 mmol/L (ref 3.5–5.1)
Sodium: 141 mmol/L (ref 135–145)

## 2017-12-19 LAB — CBC
HEMATOCRIT: 41.5 % (ref 39.0–52.0)
HEMOGLOBIN: 13.9 g/dL (ref 13.0–17.0)
MCH: 28.4 pg (ref 26.0–34.0)
MCHC: 33.5 g/dL (ref 30.0–36.0)
MCV: 84.9 fL (ref 78.0–100.0)
Platelets: 175 10*3/uL (ref 150–400)
RBC: 4.89 MIL/uL (ref 4.22–5.81)
RDW: 13.2 % (ref 11.5–15.5)
WBC: 6.3 10*3/uL (ref 4.0–10.5)

## 2017-12-19 SURGERY — MICROLARYNGOSCOPY WITH CO2 LASER AND EXCISION OF VOCAL CORD LESION
Anesthesia: General

## 2017-12-19 SURGERY — MICROLARYNGOSCOPY WITH CO2 LASER AND EXCISION OF VOCAL CORD LESION
Anesthesia: General | Site: Neck

## 2017-12-19 MED ORDER — SUGAMMADEX SODIUM 200 MG/2ML IV SOLN
INTRAVENOUS | Status: DC | PRN
  Administered 2017-12-19: 360 mg via INTRAVENOUS

## 2017-12-19 MED ORDER — PROPOFOL 10 MG/ML IV BOLUS
INTRAVENOUS | Status: DC | PRN
  Administered 2017-12-19: 40 mg via INTRAVENOUS
  Administered 2017-12-19 (×2): 20 mg via INTRAVENOUS
  Administered 2017-12-19: 160 mg via INTRAVENOUS

## 2017-12-19 MED ORDER — EPINEPHRINE HCL (NASAL) 0.1 % NA SOLN
NASAL | Status: AC
  Filled 2017-12-19: qty 30

## 2017-12-19 MED ORDER — PROPOFOL 500 MG/50ML IV EMUL
INTRAVENOUS | Status: DC | PRN
  Administered 2017-12-19: 150 ug/kg/min via INTRAVENOUS

## 2017-12-19 MED ORDER — ROCURONIUM BROMIDE 100 MG/10ML IV SOLN
INTRAVENOUS | Status: DC | PRN
  Administered 2017-12-19: 50 mg via INTRAVENOUS

## 2017-12-19 MED ORDER — LACTATED RINGERS IV SOLN
INTRAVENOUS | Status: DC
  Administered 2017-12-19: 50 mL/h via INTRAVENOUS

## 2017-12-19 MED ORDER — ONDANSETRON HCL 4 MG/2ML IJ SOLN
INTRAMUSCULAR | Status: DC | PRN
  Administered 2017-12-19: 4 mg via INTRAVENOUS

## 2017-12-19 MED ORDER — MIDAZOLAM HCL 2 MG/2ML IJ SOLN
INTRAMUSCULAR | Status: AC
  Filled 2017-12-19: qty 2

## 2017-12-19 MED ORDER — PROPOFOL 10 MG/ML IV BOLUS
INTRAVENOUS | Status: AC
  Filled 2017-12-19: qty 40

## 2017-12-19 MED ORDER — FENTANYL CITRATE (PF) 250 MCG/5ML IJ SOLN
INTRAMUSCULAR | Status: AC
  Filled 2017-12-19: qty 5

## 2017-12-19 MED ORDER — EPINEPHRINE PF 1 MG/ML IJ SOLN
INTRAMUSCULAR | Status: DC | PRN
  Administered 2017-12-19: 1 mg via ENDOTRACHEOPULMONARY

## 2017-12-19 MED ORDER — DEXAMETHASONE SODIUM PHOSPHATE 4 MG/ML IJ SOLN
INTRAMUSCULAR | Status: DC | PRN
  Administered 2017-12-19: 10 mg via INTRAVENOUS

## 2017-12-19 MED ORDER — MIDAZOLAM HCL 5 MG/5ML IJ SOLN
INTRAMUSCULAR | Status: DC | PRN
  Administered 2017-12-19: 1 mg via INTRAVENOUS

## 2017-12-19 MED ORDER — FENTANYL CITRATE (PF) 250 MCG/5ML IJ SOLN
INTRAMUSCULAR | Status: DC | PRN
  Administered 2017-12-19: 100 ug via INTRAVENOUS

## 2017-12-19 MED ORDER — LIDOCAINE HCL (CARDIAC) 20 MG/ML IV SOLN
INTRAVENOUS | Status: DC | PRN
  Administered 2017-12-19: 100 mg via INTRAVENOUS

## 2017-12-19 SURGICAL SUPPLY — 30 items
BALLN PULM 12 13.5 15X75 (BALLOONS)
BALLN PULMONARY 10-12 (MISCELLANEOUS) IMPLANT
BALLOON PULM 12 13.5 15X75 (BALLOONS) IMPLANT
BANDAGE EYE OVAL (MISCELLANEOUS) ×4 IMPLANT
BLADE SURG 15 STRL LF DISP TIS (BLADE) IMPLANT
BLADE SURG 15 STRL SS (BLADE)
CANISTER SUCT 3000ML PPV (MISCELLANEOUS) ×2 IMPLANT
CONT SPEC 4OZ CLIKSEAL STRL BL (MISCELLANEOUS) IMPLANT
COVER BACK TABLE 60X90IN (DRAPES) ×2 IMPLANT
COVER MAYO STAND STRL (DRAPES) ×2 IMPLANT
CRADLE DONUT ADULT HEAD (MISCELLANEOUS) IMPLANT
DRAPE HALF SHEET 40X57 (DRAPES) ×2 IMPLANT
GAUZE SPONGE 4X4 12PLY STRL (GAUZE/BANDAGES/DRESSINGS) ×2 IMPLANT
GLOVE BIO SURGEON STRL SZ7.5 (GLOVE) ×2 IMPLANT
GOWN STRL REUS W/ TWL LRG LVL3 (GOWN DISPOSABLE) IMPLANT
GOWN STRL REUS W/TWL LRG LVL3 (GOWN DISPOSABLE)
KIT BASIN OR (CUSTOM PROCEDURE TRAY) ×2 IMPLANT
KIT ROOM TURNOVER OR (KITS) ×2 IMPLANT
NDL HYPO 25GX1X1/2 BEV (NEEDLE) IMPLANT
NEEDLE HYPO 25GX1X1/2 BEV (NEEDLE) IMPLANT
NS IRRIG 1000ML POUR BTL (IV SOLUTION) ×2 IMPLANT
PAD ARMBOARD 7.5X6 YLW CONV (MISCELLANEOUS) ×4 IMPLANT
PATTIES SURGICAL .5 X1 (DISPOSABLE) ×2 IMPLANT
PATTIES SURGICAL .5 X3 (DISPOSABLE) IMPLANT
SOLUTION ANTI FOG 6CC (MISCELLANEOUS) ×2 IMPLANT
SURGILUBE 2OZ TUBE FLIPTOP (MISCELLANEOUS) IMPLANT
SUT SILK 2 0 PERMA HAND 18 BK (SUTURE) IMPLANT
TOWEL NATURAL 6PK STERILE (DISPOSABLE) ×2 IMPLANT
TUBE CONNECTING 12X1/4 (SUCTIONS) ×2 IMPLANT
WATER STERILE IRR 1000ML POUR (IV SOLUTION) ×2 IMPLANT

## 2017-12-19 NOTE — Anesthesia Preprocedure Evaluation (Signed)
Anesthesia Evaluation  Patient identified by MRN, date of birth, ID band Patient awake    Reviewed: Allergy & Precautions, NPO status , Patient's Chart, lab work & pertinent test results  Airway Mallampati: II  TM Distance: >3 FB Neck ROM: Full    Dental no notable dental hx.    Pulmonary neg pulmonary ROS, COPD, former smoker,    Pulmonary exam normal breath sounds clear to auscultation       Cardiovascular negative cardio ROS Normal cardiovascular exam Rhythm:Regular Rate:Normal     Neuro/Psych Anxiety Depression negative neurological ROS  negative psych ROS   GI/Hepatic negative GI ROS, Neg liver ROS, GERD  ,  Endo/Other  negative endocrine ROSHypothyroidism   Renal/GU negative Renal ROS  negative genitourinary   Musculoskeletal negative musculoskeletal ROS (+)   Abdominal   Peds negative pediatric ROS (+)  Hematology negative hematology ROS (+)   Anesthesia Other Findings Throat cancer  Reproductive/Obstetrics negative OB ROS                             Anesthesia Physical Anesthesia Plan  ASA: III  Anesthesia Plan: General   Post-op Pain Management:    Induction: Intravenous  PONV Risk Score and Plan: 2 and Ondansetron and Midazolam  Airway Management Planned: Oral ETT  Additional Equipment:   Intra-op Plan:   Post-operative Plan: Extubation in OR  Informed Consent: I have reviewed the patients History and Physical, chart, labs and discussed the procedure including the risks, benefits and alternatives for the proposed anesthesia with the patient or authorized representative who has indicated his/her understanding and acceptance.   Dental advisory given  Plan Discussed with: CRNA  Anesthesia Plan Comments:         Anesthesia Quick Evaluation

## 2017-12-19 NOTE — Anesthesia Procedure Notes (Signed)
Procedure Name: General with mask airway Date/Time: 12/19/2017 10:50 AM Performed by: Glynda Jaeger, CRNA Pre-anesthesia Checklist: Patient identified, Emergency Drugs available, Suction available, Patient being monitored and Timeout performed Patient Re-evaluated:Patient Re-evaluated prior to induction Oxygen Delivery Method: Circle system utilized Preoxygenation: Pre-oxygenation with 100% oxygen Induction Type: IV induction Placement Confirmation: breath sounds checked- equal and bilateral Tube secured with: Tape Dental Injury: Teeth and Oropharynx as per pre-operative assessment

## 2017-12-19 NOTE — Brief Op Note (Signed)
12/19/2017  11:16 AM  PATIENT:  Stephen [name]  60 y.o. male  PRE-OPERATIVE DIAGNOSIS:  True vocal cord lesion  POST-OPERATIVE DIAGNOSIS:  True vocal cord lesion  PROCEDURE:  Procedure(s): MICROLARYNGOSCOPY WITH BIOPSY (N/A)  SURGEON:  Surgeon(s) and Role:    * Melida Quitter, MD - Primary  PHYSICIAN ASSISTANT:   ASSISTANTS: none   ANESTHESIA:   general  EBL:  Minimal   BLOOD ADMINISTERED:none  DRAINS: none   LOCAL MEDICATIONS USED:  NONE  SPECIMEN:  Source of Specimen:  Left vocal fold  DISPOSITION OF SPECIMEN:  PATHOLOGY  COUNTS:  YES  TOURNIQUET:  * No tourniquets in log *  DICTATION: .Other Dictation: Dictation Number [id number]  PLAN OF CARE: Discharge to home after PACU  PATIENT DISPOSITION:  PACU - hemodynamically stable.   Delay start of Pharmacological VTE agent (>24hrs) due to surgical blood loss or risk of bleeding: no

## 2017-12-19 NOTE — H&P (Signed)
Stephen [name] is an 61 y.o. male.   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  He has noticed hoarseness for two months again and was found to have a left-sided vocal cord white lesion and presents for surgical management.  Past Medical History:  Diagnosis Date  . Allergic rhinitis, cause unspecified   . Anxiety   . Blindness of left eye   . Complication of anesthesia    difficulty awaking after colonoscopy   . Depression   . Dyspnea   . GERD (gastroesophageal reflux disease)   . Hypothyroidism   . Lung cancer ([place])    lung ca dx 06, has had chemo and radiation  . Osteoporosis   . Other emphysema ([place])    copd  . Throat cancer ([place])    throat ca dx 2007  . Thrombosed hemorrhoids   . Tubular adenoma of colon 2009  . Unspecified vitamin D deficiency     Past Surgical History:  Procedure Laterality Date  . COLONOSCOPY    . HERNIA REPAIR     as a baby- not sure where hernia was  . KNEE SURGERY Left 1972   .  has hardware  . LUNG LOBECTOMY     RUL  . ROTATOR CUFF REPAIR Left   . SHOULDER SURGERY Right    rotator cuff  . THROAT SURGERY     Laser surgery for throat cancer  . TRANSTHORACIC ECHOCARDIOGRAM  01/13/2012   EF=>55%; trace MR/TR;     Family History  Problem Relation Age of Onset  . Cancer Mother        Brain tumor  . COPD Mother   . Heart disease Father        CABG  . Prostate cancer Father   . Cystic fibrosis Sister        also heart failure  . Mental illness Brother   . Heart failure Paternal Grandmother   . Colon cancer Neg Hx   . Colon polyps Neg Hx   . Esophageal cancer Neg Hx    Social History:  reports that he quit smoking about 12 years ago. His smoking use included cigarettes. He has a 48.00 pack-year smoking history. he has never used smokeless tobacco. He reports that he drinks alcohol. He reports that he  does not use drugs.  Allergies:  Allergies  Allergen Reactions  . Codeine Other (See Comments)    unknown  . Cymbalta [Duloxetine Hcl] Nausea And Vomiting  . Montelukast Sodium Other (See Comments)    Causes sinusitis    Medications Prior to Admission  Medication Sig Dispense Refill  . albuterol (PROVENTIL HFA;VENTOLIN HFA) 108 (90 Base) MCG/ACT inhaler Inhale 1-2 puffs into the lungs every 6 (six) hours as needed for wheezing or shortness of breath.    Marland Kitchen albuterol (PROVENTIL) (2.5 MG/3ML) 0.083% nebulizer solution Take 2.5 mg by nebulization every 6 (six) hours as needed (for wheezing/shortness of breath.).     Marland Kitchen aspirin 81 MG chewable tablet Chew 81 mg by mouth daily.    . Fluticasone-Umeclidin-Vilant (TRELEGY ELLIPTA) 100-62.5-25 MCG/INH AEPB Inhale 1 puff into the lungs daily. 60 each 2  . levothyroxine (SYNTHROID, LEVOTHROID) 125 MCG tablet Take 62.5 mcg by mouth daily before breakfast.     . pravastatin (PRAVACHOL) 40 MG tablet Take 40 mg by mouth at bedtime.     . temazepam (RESTORIL) 30 MG  capsule Take 30 mg by mouth at bedtime.     . Vitamin D, Ergocalciferol, (DRISDOL) 50000 UNITS CAPS Take 1 tablet by mouth every 14 (fourteen) days. Take 1 tablet by mouth every two weeks per patient    . cephALEXin (KEFLEX) 500 MG capsule Take 1 capsule (500 mg total) by mouth 3 (three) times daily. (Patient not taking: Reported on 12/16/2017) 15 capsule 0  . nitroGLYCERIN (NITROSTAT) 0.4 MG SL tablet Place 1 tablet (0.4 mg total) under the tongue every 5 (five) minutes as needed for chest pain. 20 tablet 0  . predniSONE (DELTASONE) 10 MG tablet 40 mg daily x 2 days, then 20mg  daily x 2 days, then 10mg  daily x 2 days, then 5mg  daily x 2 days and stop (Patient not taking: Reported on 12/16/2017) 15 tablet 0    Results for orders placed or performed during the hospital encounter of 12/19/17 (from the past 48 hour(s))  CBC     Status: None   Collection Time: 12/19/17  7:31 AM  Result Value Ref  Range   WBC 6.3 4.0 - 10.5 K/uL   RBC 4.89 4.22 - 5.81 MIL/uL   Hemoglobin 13.9 13.0 - 17.0 g/dL   HCT 41.5 39.0 - 52.0 %   MCV 84.9 78.0 - 100.0 fL   MCH 28.4 26.0 - 34.0 pg   MCHC 33.5 30.0 - 36.0 g/dL   RDW 13.2 11.5 - 15.5 %   Platelets 175 150 - 400 K/uL   No results found.  Review of Systems  All other systems reviewed and are negative.   Blood pressure (!) 144/97, pulse 82, temperature 97.6 F (36.4 C), temperature source Oral, resp. rate 18, height 5\' 10"  (1.778 m), weight 196 lb 3.2 oz (89 kg), SpO2 97 %. Physical Exam  Constitutional: He is oriented to person, place, and time. He appears well-developed and well-nourished. No distress.  HENT:  Head: Normocephalic and atraumatic.  Right Ear: External ear normal.  Left Ear: External ear normal.  Nose: Nose normal.  Mouth/Throat: Oropharynx is clear and moist.  Raspy hoarseness.  Eyes: Conjunctivae and EOM are normal. Pupils are equal, round, and reactive to light.  Neck: Normal range of motion. Neck supple.  Cardiovascular: Normal rate.  Respiratory: Effort normal.  Musculoskeletal: Normal range of motion.  Neurological: He is alert and oriented to person, place, and time. No cranial nerve deficit.  Skin: Skin is warm and dry.  Psychiatric: He has a normal mood and affect. His behavior is normal. Judgment and thought content normal.     Assessment/Plan Left vocal fold lesion, hoarseness To OR for SMDL with CO2 laser excision.  Melida Quitter, MD 12/19/2017, 10:29 AM

## 2017-12-19 NOTE — Op Note (Signed)
NAME:  Stephen [name], Stephen [name] NO.:  MEDICAL RECORD NO.:  [id number]  LOCATION:                                 FACILITY:  PHYSICIAN:  Onnie Graham, MD     DATE OF BIRTH:  12/28/1956  DATE OF PROCEDURE:  12/19/2017 DATE OF DISCHARGE:                              OPERATIVE REPORT   PREOPERATIVE DIAGNOSIS:  Left vocal cord lesion.  POSTOPERATIVE DIAGNOSIS:  Left vocal cord lesion.  PROCEDURE:  Suspended microdirect laryngoscopy with biopsy.  SURGEON:  Onnie Graham, MD.  ANESTHESIA:  General jet Venturi ventilation.  COMPLICATIONS:  None.  INDICATION:  The patient is a 61 year old male who underwent surgical and radiation treatment for a right T1 glottic carcinoma about 10 years ago.  Over the last couple of months, he has noticed worsening of his voice and was found to have a prominent white lesion on the left vocal fold.  He presents to the operative room for surgical management.  FINDINGS:  Upon laryngoscopy, the entire anterior two-thirds of the right vocal fold is involved with a raised and irregular white mass that extends to the anterior commissure, but does not appear to cross and to the vocal process posteriorly.  The lesion extends laterally into the ventricle where the lateral margin was not able to be visualized.  The subglottis is clear as is the right vocal fold.  DESCRIPTION OF PROCEDURE:  The patient was identified in the holding room.  Informed consent having been obtained including discussion of risks, benefits, alternatives, the patient was brought to the operative suite and put on the operating table in supine position.  Anesthesia was induced.  The patient was maintained via mask ventilation.  The bed was turned 90 degrees from anesthesia and the eyes taped closed.  The patient was given intravenous steroids during the case.  A tooth guard was placed over the upper teeth and damp eye pads taped over the eyes. A Storz laryngoscope was  inserted and placed into the supraglottic position and suspended to the Mayo stand using a Lewy arm.  Jet ventilation was then initiated.  A 0-degree telescope was used to make photograph of the lesion and was inspected by pushing the false vocal fold out of the way.  The telescope was also passed into the subglottis to view the trachea and subglottis.  The biopsies were then taken with cup forceps and passed to nursing for pathology.  These were all from the left true vocal fold lesion.  At this point, an epinephrine pledget was held against the vocal fold for a couple of minutes and then removed.  The airway was suctioned.  The laryngoscope was taken out of suspension and removed from the patient's mouth.  He was turned back to Anesthesia for wake up and was moved to the recovery room in stable condition.     Onnie Graham, MD     DDB/MEDQ  D:  12/19/2017  T:  12/19/2017  Job:  [id number]

## 2017-12-19 NOTE — Addendum Note (Signed)
Addendum  created 12/19/17 1248 by Glynda Jaeger, CRNA   Charge Capture section accepted

## 2017-12-19 NOTE — Anesthesia Postprocedure Evaluation (Signed)
Anesthesia Post Note  Patient: Stephen [name]  Procedure(s) Performed: MICROLARYNGOSCOPY WITH BIOPSY (N/A Neck)     Patient location during evaluation: PACU Anesthesia Type: General Level of consciousness: awake and alert Pain management: pain level controlled Vital Signs Assessment: post-procedure vital signs reviewed and stable Respiratory status: spontaneous breathing, nonlabored ventilation and respiratory function stable Cardiovascular status: blood pressure returned to baseline and stable Postop Assessment: no apparent nausea or vomiting Anesthetic complications: no    Last Vitals:  Vitals:   12/19/17 1226 12/19/17 1230  BP: 133/90   Pulse: 65 66  Resp: 20 (!) 21  Temp:    SpO2: 100% 100%    Last Pain:  Vitals:   12/19/17 0728  TempSrc: Oral                 Lynda Rainwater

## 2017-12-19 NOTE — Transfer of Care (Signed)
Immediate Anesthesia Transfer of Care Note  Patient: Stephen [name]  Procedure(s) Performed: MICROLARYNGOSCOPY WITH BIOPSY (N/A Neck)  Patient Location: PACU  Anesthesia Type:General  Level of Consciousness: awake, alert  and oriented  Airway & Oxygen Therapy: Patient Spontanous Breathing and Patient connected to face mask oxygen  Post-op Assessment: Report given to RN and Post -op Vital signs reviewed and stable  Post vital signs: Reviewed and stable  Last Vitals:  Vitals:   12/19/17 0728  BP: (!) 144/97  Pulse: 82  Resp: 18  Temp: 36.4 C  SpO2: 97%    Last Pain:  Vitals:   12/19/17 0728  TempSrc: Oral      Patients Stated Pain Goal: 3 (52/17/47 1595)  Complications: No apparent anesthesia complications

## 2017-12-20 ENCOUNTER — Encounter (HOSPITAL_COMMUNITY): Payer: Self-pay | Admitting: Otolaryngology

## 2017-12-31 ENCOUNTER — Other Ambulatory Visit: Payer: Self-pay | Admitting: Otolaryngology

## 2017-12-31 DIAGNOSIS — C320 Malignant neoplasm of glottis: Secondary | ICD-10-CM

## 2018-01-01 ENCOUNTER — Ambulatory Visit
Admission: RE | Admit: 2018-01-01 | Discharge: 2018-01-01 | Disposition: A | Discharge status: Home / Self Care | Payer: 59 | Source: Ambulatory Visit | Attending: Otolaryngology | Admitting: Otolaryngology

## 2018-01-01 ENCOUNTER — Telehealth: Payer: Self-pay | Admitting: *Deleted

## 2018-01-01 DIAGNOSIS — C320 Malignant neoplasm of glottis: Secondary | ICD-10-CM

## 2018-01-01 MED ORDER — IOPAMIDOL (ISOVUE-300) INJECTION 61%
75.0000 mL | Freq: Once | INTRAVENOUS | Status: AC | PRN
  Administered 2018-01-01: 75 mL via INTRAVENOUS

## 2018-01-01 NOTE — Telephone Encounter (Signed)
Oncology Nurse Navigator Documentation  In follow-up to call from [facility] ENT Dr. Redmond Baseman' [place], faxed notes (04/01/2006, 04/03/2006, 06/05/2006, 06/22/09) re RT  received by pt with Dr. Beola Cord in 2007.  Notification of successful fax transmission received.  Gayleen Orem, RN, BSN, [place] Neck Oncology Nurse [place] at [place] [phone]

## 2018-01-05 ENCOUNTER — Telehealth: Payer: Self-pay | Admitting: *Deleted

## 2018-01-05 NOTE — Telephone Encounter (Signed)
Oncology Nurse Navigator Documentation  Placed introductory call to new referral patient Stephen [name].  Introduced myself as the H&N oncology nurse navigator that works with Dr. Isidore Moos to whom he has been referred by Dr. Redmond Baseman.    He was aware of referral, I later called him to provide 1/5 12:30NE/1:00 Consult appt.  Dr. Redmond Baseman' MA Lattie Haw also notified of appt.  I briefly explained my role as his navigator, indicated that I would be joining him during his appt next week.  I confirmed his understanding of [place] location, explained arrival and RadOnc registration process for appt.  I provided my contact information, encouraged him to call with questions/concerns before next week.  He verbalized understanding of information provided, expressed appreciation for my call.  Gayleen Orem, RN, BSN Head & Neck Oncology Nurse [place] at [place] [phone]

## 2018-01-07 NOTE — Progress Notes (Signed)
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 Redmond Baseman on 12/12/17 with a complaint of hoarseness for about 3 months.   Biopsies of left vocal cord revealed: well differentiated squamous cell carcinoma.   Nutrition Status Yes No Comments  Weight changes? [x]  []  He reports losing a couple of lbs since diagnosis.   Swallowing concerns? []  [x]  He does report a decreased appetite  PEG? []  [x]     Referrals Yes No Comments  Social Work? []  [x]    Dentistry? []  [x]    Swallowing therapy? []  [x]    Nutrition? []  [x]    Med/Onc? []  [x]     Safety Issues Yes No Comments  Prior radiation? [x]  []  Right Lung 6480 cGy in 36 fractions.  Pacemaker/ICD? []  [x]    Possible current pregnancy? []  [x]    Is the patient on methotrexate? []  [x]     Tobacco/Marijuana/Snuff/ETOH use: He is a former smoker. He quit in 2006  Past/Anticipated interventions by otolaryngology, if any: 12/19/17  PROCEDURE:  Suspended microdirect laryngoscopy with biopsy. SURGEON:  Onnie Graham, MD.  01/02/18 Dr. Redmond Baseman documented: We reviewed the pathology result demonstrating well-differentiated squamous cell carcinoma. Neck imaging suggests that the tumor is isolated to the left vocal fold. At biopsy, the lateral extent of the tumor could not be well-visualized with the ventricle. We spent quite a while discussing options for treatment. In my review of records from the [facility], it seems that the larynx was not previously treated with radiation. In that case, his options for treatment include primary radiation to the larynx versus partial laryngectomy, either open or endoscopic. I recommended he discuss the two different approaches with Radiation Oncology and the head and neck specialists at [place], respectively. Appointments will be  arranged.   Past/Anticipated interventions by medical oncology, if any:  None   Current Complaints / other details:   06/16/17 Dr. Earlie Server DIAGNOSIS:  1. Recurrent non-small cell lung cancer, presented with endobronchial lesion involving the right main stem bronchus diagnosed in March 2007.  2. History of stage IB non-small cell lung cancer diagnosed in May 2006.  3. 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  PRIOR THERAPY: 1. Status post right upper lobectomy on May 01, 2005 under the care of Dr. Arlyce Dice.  2. Status post laryngoscopy with CO2 laser excision of the right vocal cord lesion under the care of Dr. Redmond Baseman in March 2007.  3. Status post concurrent chemoradiation with weekly carboplatin and paclitaxel. Last dose was given May 12, 2006.  4. Status post 3 cycles of consolidation chemotherapy with docetaxel. Last dose was given August 21, 2006.  CURRENT THERAPY: Observation.  BP (!) 143/96   Pulse 71   Temp 98 F (36.7 C)   Ht 5\' 10"  (1.778 m)   Wt 197 lb (89.4 kg)   SpO2 97% Comment: room air  BMI 28.27 kg/m    Wt Readings from Last 3 Encounters:  01/13/18 197 lb (89.4 kg)  12/19/17 196 lb 3.2 oz (89 kg)  07/25/17 201 lb (91.2 kg)

## 2018-01-13 ENCOUNTER — Encounter: Payer: Self-pay | Admitting: *Deleted

## 2018-01-13 ENCOUNTER — Telehealth: Payer: Self-pay | Admitting: *Deleted

## 2018-01-13 ENCOUNTER — Encounter: Payer: Self-pay | Admitting: Radiation Oncology

## 2018-01-13 ENCOUNTER — Ambulatory Visit
Admission: RE | Admit: 2018-01-13 | Discharge: 2018-01-13 | Disposition: A | Discharge status: Home / Self Care | Payer: 59 | Source: Ambulatory Visit | Attending: Radiation Oncology | Admitting: Radiation Oncology

## 2018-01-13 VITALS — BP 143/96 | HR 71 | Temp 98.0°F | Ht 70.0 in | Wt 197.0 lb

## 2018-01-13 DIAGNOSIS — M810 Age-related osteoporosis without current pathological fracture: Secondary | ICD-10-CM | POA: Diagnosis not present

## 2018-01-13 DIAGNOSIS — C320 Malignant neoplasm of glottis: Secondary | ICD-10-CM | POA: Diagnosis present

## 2018-01-13 DIAGNOSIS — J439 Emphysema, unspecified: Secondary | ICD-10-CM | POA: Diagnosis not present

## 2018-01-13 DIAGNOSIS — K219 Gastro-esophageal reflux disease without esophagitis: Secondary | ICD-10-CM | POA: Insufficient documentation

## 2018-01-13 DIAGNOSIS — F419 Anxiety disorder, unspecified: Secondary | ICD-10-CM | POA: Diagnosis not present

## 2018-01-13 MED ORDER — LARYNGOSCOPY SOLUTION RAD-ONC
15.0000 mL | Freq: Once | TOPICAL | Status: AC
  Administered 2018-01-13: 15 mL via TOPICAL
  Filled 2018-01-13: qty 15

## 2018-01-13 NOTE — Telephone Encounter (Signed)
Oncology Nurse Navigator Documentation  Rec'd call from pt to confirm understanding of check-in/arrival procedure for this afternoon's 12:30 appt for NE and consult with Dr. Isidore Moos.  Gayleen Orem, RN, BSN Head & Neck Oncology Nurse [place] at [place] [phone]

## 2018-01-13 NOTE — Progress Notes (Signed)
Radiation Oncology         [phone] ________________________________  Initial outpatient Consultation  Name: Stephen [name] MRN: [id number]  Date: 01/13/2018  DOB: 11/13/1956  [id number], [name], MD  Melida Quitter, MD   REFERRING PHYSICIAN: Melida Quitter, MD  DIAGNOSIS:    ICD-10-CM   1. Malignant neoplasm of glottis ([place]) C32.0 laryngocopy solution for Rad-Onc    Fiberoptic laryngoscopy   Cancer Staging Bronchogenic cancer of right lung ([place]) Staging form: Lung, AJCC 7th Edition - Clinical: Stage IB (T2a, N0, M0, Free text: Recurrent non-small cell lung cancer) - Signed by Curt Bears, MD on 09/19/2014 - Pathologic: No stage assigned - Unsigned  Malignant neoplasm of glottis ([place]) Staging form: Larynx - Glottis, AJCC 8th Edition - Clinical: Stage I (cT1b, cN0, cM0) - Signed by Eppie Gibson, MD on 01/13/2018   CHIEF COMPLAINT: Here to discuss management of his vocal cord cancer  HISTORY OF PRESENT ILLNESS::Stephen [name] is a 62 y.o. male .Marland KitchenMarland KitchenThe patient has a history of T1 right-sided glottic cancer in 2007 treated with laser excision.  I am trying to get Dr Redmond Baseman' notes from that procedure.  He was referred by his PCP, Dr. Joylene Draft, to Dr. Redmond Baseman in ENT on 12/12/17 with hoarseness x 3 months. Dr. Redmond Baseman noted a left vocal fold lesion which was prominent, white, and irregular. The vocal folds moved symmetrically and the right vocal fold appeared normal. On 12/19/17 Dr. Redmond Baseman performed microlaryngoscopy with biopsy. He was unable to perform CO2 laser excision given size of the lesion. During the procedure, I believe the entire anterior 2/3s of the left vocal fold had an irregular white raised mass extending to the anterior commissure. The note states this was on the right side but I believe that's a typo. The subglottis was noted to be clear as was the right vocal fold.  Dr. Redmond Baseman noted that at biopsy the lateral extent of the tumor could not be well-visualized with the  ventricle. Given his larynx has not previously received radiation, Dr. Redmond Baseman suggests radiation to larynx vs partial laryngectomy for treatment. He arranged consultation at [facility] for consideration of partial laryngectomy and consultation with me to discuss radiotherapy.   Pertinent imaging thus far includes neck CT performed on 01/01/2018 revealing a new 4 mm nodular density on the left vocal cord near the anterior commissure consistent with clinical history of left vocal cord cancer. There was no evidence of regional invasion or metastatic lymphadenopathy. There was no pharyngeal mass or swelling.  Chest CT from 06/09/2017 showed chronic postoperative and postradiation changes. There was no definite evidence to suggest local recurrence of disease or metastatic disease in the thorax.   I have personally reviewed his images.  On review of systems, patient feels that he is straining to get his words out. Denies coughing up blood. Endorses SOB that he relates to COPD. Denies chest pains, bowel troubles, urinary symptoms, or edema. He endorses anxiety/depressed with cancer diagnosis but denies suicidal ideations. He cites father's death as current stressor. Denies chewing tobacco. Endorses hernia. Endorses hx of TIA stroke but with no residual deficits. Endorses skin changes at throat but denies discomfort during radiation of lung mass. Endorses thyroid supplement. Endorses congestion in his throat. Reports mother died of brain cancer.  Swallowing issues, if any: no, but he does endorse decreased appetite  Weight Changes: reports weight loss of ~2#.  Tobacco history, if any: 1.5 ppd, 48 pack years, quit date 12/30/2004  ETOH use, if any: yes, though  rare  Prior cancers, if any:   DIAGNOSIS: 1. Recurrent non-small cell lung cancer, presented with endobronchial lesion involving the right main stem bronchus diagnosed in March 2007.  2. History of stage IB non-small cell lung cancer diagnosed in  May 2006.  3. 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  PRIOR THERAPY: 1. Status post right upper lobectomy on May 01, 2005 under the care of Dr. Arlyce Dice.  2. Status post laryngoscopy with CO2 laser excision of the right vocal cord lesion under the care of Dr. Redmond Baseman in March 2007.  3. Status post concurrent chemoradiation with weekly carboplatin and paclitaxel. Last dose was given May 12, 2006.  4. Status post 3 cycles of consolidation chemotherapy with docetaxel. Last dose was given August 21, 2006.  PREVIOUS RADIATION THERAPY: Right Lung 6480 cGy in 36 fractions in 2007 - Dr Mila Homer.  PAST MEDICAL HISTORY:  has a past medical history of Allergic rhinitis, cause unspecified, Anxiety, Blindness of left eye, Complication of anesthesia, Depression, Dyspnea, GERD (gastroesophageal reflux disease), Hypothyroidism, Lung cancer ([place]), Osteoporosis, Other emphysema ([place]), Throat cancer ([place]), Thrombosed hemorrhoids, Tubular adenoma of colon (2009), and Unspecified vitamin D deficiency.    PAST SURGICAL HISTORY: Past Surgical History:  Procedure Laterality Date  . COLONOSCOPY    . HERNIA REPAIR     as a baby- not sure where hernia was  . KNEE SURGERY Left 1972   .  has hardware  . LUNG LOBECTOMY     RUL  . MICROLARYNGOSCOPY WITH CO2 LASER AND EXCISION OF VOCAL CORD LESION N/A 12/19/2017   Procedure: MICROLARYNGOSCOPY WITH BIOPSY;  Surgeon: Melida Quitter, MD;  Location: [place];  Service: ENT;  Laterality: N/A;  . ROTATOR CUFF REPAIR Left   . SHOULDER SURGERY Right    rotator cuff  . THROAT SURGERY     Laser surgery for throat cancer  . TRANSTHORACIC ECHOCARDIOGRAM  01/13/2012   EF=>55%; trace MR/TR;     FAMILY HISTORY: family history includes COPD in his mother; Cancer in his mother; Cystic fibrosis in his sister; Heart disease in his father; Heart failure in his paternal grandmother; Mental illness in his brother; Prostate cancer in his  father.  SOCIAL HISTORY:  reports that he quit smoking about 13 years ago. His smoking use included cigarettes. He has a 48.00 pack-year smoking history. he has never used smokeless tobacco. He reports that he drinks alcohol. He reports that he does not use drugs.  ALLERGIES: Codeine; Cymbalta [duloxetine hcl]; and Montelukast sodium  MEDICATIONS:  Current Outpatient Medications  Medication Sig Dispense Refill  . albuterol (PROVENTIL HFA;VENTOLIN HFA) 108 (90 Base) MCG/ACT inhaler Inhale 1-2 puffs into the lungs every 6 (six) hours as needed for wheezing or shortness of breath.    Marland Kitchen albuterol (PROVENTIL) (2.5 MG/3ML) 0.083% nebulizer solution Take 2.5 mg by nebulization every 6 (six) hours as needed (for wheezing/shortness of breath.).     Marland Kitchen aspirin 81 MG chewable tablet Chew 81 mg by mouth daily.    . Fluticasone-Umeclidin-Vilant (TRELEGY ELLIPTA) 100-62.5-25 MCG/INH AEPB Inhale 1 puff into the lungs daily. 60 each 2  . levothyroxine (SYNTHROID, LEVOTHROID) 125 MCG tablet Take 62.5 mcg by mouth daily before breakfast.     . nitroGLYCERIN (NITROSTAT) 0.4 MG SL tablet Place 1 tablet (0.4 mg total) under the tongue every 5 (five) minutes as needed for chest pain. 20 tablet 0  . pravastatin (PRAVACHOL) 40 MG tablet Take 40 mg by mouth at  bedtime.     . temazepam (RESTORIL) 30 MG capsule Take 30 mg by mouth at bedtime.     . Vitamin D, Ergocalciferol, (DRISDOL) 50000 UNITS CAPS Take 1 tablet by mouth every 14 (fourteen) days. Take 1 tablet by mouth every two weeks per patient     No current facility-administered medications for this encounter.     REVIEW OF SYSTEMS:  A 10+ POINT REVIEW OF SYSTEMS WAS OBTAINED including neurology, dermatology, psychiatry, cardiac, respiratory, lymph, extremities, GI, GU, Musculoskeletal, constitutional, breasts, reproductive, HEENT.  All pertinent positives are noted in the HPI.  All others are negative.   PHYSICAL EXAM:  height is 5\' 10"  (1.778 m) and weight is  197 lb (89.4 kg). His temperature is 98 F (36.7 C). His blood pressure is 143/96 (abnormal) and his pulse is 71. His oxygen saturation is 97%.   General: Alert and oriented, in no acute distress. hoarse  HEENT: Head is normocephalic. Extraocular movements are intact. Oropharynx is notable for left tonsil being slightly more prominent than right. No lesions appreciated in the oral cavity.  Neck: No cervical or supraclavicular adenopathy.  Heart: Regular in rate and rhythm with no murmurs, rubs, or gallops. Chest: Clear to auscultation bilaterally, with no rhonchi, wheezes, or rales. Abdomen: Soft, nontender, nondistended, with no rigidity or guarding. Extremities: No cyanosis or edema. Lymphatics: see Neck Exam Skin: No concerning lesions. Musculoskeletal: symmetric strength and muscle tone throughout. Neurologic: Cranial nerves II through XII are grossly intact. No obvious focalities. Speech is fluent. Coordination is intact. Psychiatric: Judgment and insight are intact. Affect is appropriate.   PROCEDURE NOTE: After obtaining consent and anesthetizing the nasal cavity with topical lidocaine and phenylephrine, the flexible endoscope was introduced and passed through the nasal cavity.  Over the anterior 2/3s of the left true cord there is a raised white nodular lesion that appears to involve the anterior commissure across the midline. It's unclear if it's actually attached to the anterior right true cord. Grossly, the supraglottic structures appear normal. The cords are symmetrically mobile.   ECOG = 1  0 - Asymptomatic (Fully active, able to carry on all predisease activities without restriction)  1 - Symptomatic but completely ambulatory (Restricted in physically strenuous activity but ambulatory and able to carry out work of a light or sedentary nature. For example, light housework, office work)  2 - Symptomatic, <50% in bed during the day (Ambulatory and capable of all self care but unable  to carry out any work activities. Up and about more than 50% of waking hours)  3 - Symptomatic, >50% in bed, but not bedbound (Capable of only limited self-care, confined to bed or chair 50% or more of waking hours)  4 - Bedbound (Completely disabled. Cannot carry on any self-care. Totally confined to bed or chair)  5 - Death   Stephen [name] MM, Creech RH, Tormey DC, et al. [phone]). "Toxicity and response criteria of the [facility]". [place] Oncol. 5 (6): 649-55   LABORATORY DATA:  Lab Results  Component Value Date   WBC 6.3 12/19/2017   HGB 13.9 12/19/2017   HCT 41.5 12/19/2017   MCV 84.9 12/19/2017   PLT 175 12/19/2017   CMP     Component Value Date/Time   NA 141 12/19/2017 1015   NA 140 06/09/2017 1116   K 3.8 12/19/2017 1015   K 4.0 06/09/2017 1116   CL 109 12/19/2017 1015   CL 106 09/14/2012 0808   CO2 26 12/19/2017  1015   CO2 28 06/09/2017 1116   GLUCOSE 100 (H) 12/19/2017 1015   GLUCOSE 97 06/09/2017 1116   GLUCOSE 98 09/14/2012 0808   BUN 12 12/19/2017 1015   BUN 16.0 06/09/2017 1116   CREATININE 0.94 12/19/2017 1015   CREATININE 1.0 06/09/2017 1116   CALCIUM 9.0 12/19/2017 1015   CALCIUM 9.8 06/09/2017 1116   PROT 7.5 06/09/2017 1116   ALBUMIN 4.1 06/09/2017 1116   AST 19 06/09/2017 1116   ALT 16 06/09/2017 1116   ALKPHOS 115 06/09/2017 1116   BILITOT 0.89 06/09/2017 1116   GFRNONAA >60 12/19/2017 1015   GFRAA >60 12/19/2017 1015      No results found for: TSH  RADIOGRAPHY: Ct Soft Tissue Neck W Contrast  Result Date: 01/01/2018 CLINICAL DATA:  LEFT vocal cord mass, recent surgery, mass was unable to be removed according to the clinical data. Previous history of throat cancer in 2006. EXAM: CT NECK WITH CONTRAST TECHNIQUE: Multidetector CT imaging of the neck was performed using the standard protocol following the bolus administration of intravenous contrast. CONTRAST:  37mL ISOVUE-300 IOPAMIDOL (ISOVUE-300) INJECTION 61% COMPARISON:   CT neck 03/11/2012.  CT chest 06/09/2017. FINDINGS: Pharynx and larynx: No pharyngeal mass or swelling. In the larynx, there is a small nodular density on the LEFT vocal cord, 4 mm, near the anterior commissure. This nodule was not definitely seen on prior study from 03/11/2012. No cartilage destruction or extension to the paraglottic fat. Salivary glands: No inflammation, mass, or stone. Thyroid: Normal. Lymph nodes: None enlarged or abnormal density. Vascular: Negative. Limited intracranial: Negative. Visualized orbits: Negative. Mastoids and visualized paranasal sinuses: Clear. Skeleton: No acute or aggressive process. Upper chest: Status post RIGHT upper lobectomy. Post therapeutic changes appear similar to prior chest CT from 06/09/2017. Other: None. IMPRESSION: 4 mm nodular density on the LEFT vocal cord, is new from priors, and is consistent with the clinical history of LEFT vocal cord cancer. There does not appear to be regional invasion or metastatic lymphadenopathy. Electronically Signed   By: Staci Righter M.D.   On: 01/01/2018 16:31      IMPRESSION/PLAN:  This is a delightful patient with head and neck cancer.I do recommend radiotherapy for this patient. But I encourage him to hear the surgical opinion of Dr Nicolette Bang at [facility] as scheduled as well.   We discussed the potential risks, benefits, and side effects of radiotherapy. We talked in detail about acute and late effects. We discussed that some of the most bothersome acute effects may be mucositis, dysgeusia, salivary changes, skin irritation, hair loss, dehydration, weight loss and fatigue. We talked about late effects which include but are not necessarily limited to dysphagia, hypothyroidism, rare permanent laryngeal injury.. No guarantees of treatment were given. A consent form was signed and placed in the patient's medical record. The patient is enthusiastic about proceeding with treatment. I look forward to participating in the patient's  care.    I will hold slot for CT sim on 01/23/18 ( he is scheduled with Dr. Nicolette Bang on 23rd)  I anticipate 6 weeks of radiation.   Simulation (treatment planning) will take place pending consult at [facility]  We also discussed that the treatment of head and neck cancer is a multidisciplinary process to maximize treatment outcomes and quality of life. For this reason  the following referrals may be made... If RT is pursued.   Nutritionist for nutrition support during and after treatment.   Speech language pathology for swallowing and/or speech therapy.  Social work for social support.    Physical therapy due to risk of deconditioning.   Baseline labs.  __________________________________________   Eppie Gibson, MD  This document serves as a record of services personally performed by Eppie Gibson, MD. It was created on his behalf by Linward Natal, a trained medical scribe. The creation of this record is based on the scribe's personal observations and the provider's statements to them. This document has been checked and approved by the attending provider.

## 2018-01-16 NOTE — Progress Notes (Signed)
Oncology Nurse Navigator Documentation  Met with patient during initial consult with Dr. Isidore Moos.  He was unaccompanied.    1. Further introduced myself as his Navigator, explained my role as a member of the Care Team.   2. Provided New Patient Information packet, discussed contents:  Contact information for physician(s), myself, other members of the Care Team.  Advance Directive information ([place] blue pamphlet with LCSW contact info).  Provided [facility] AD booklet.  Fall Prevention Patient Safety Plan  Appointment Guideline  Financial Assistance Information sheet  [place] with highlight of [place]  SLP information sheet 3. Provided introductory explanation of radiation treatment including SIM planning and purpose of Aquaplast head and shoulder mask, showed them example.   4. He indicated has appt with Dr. Nicolette Bang [facility] next week Monday 12/21 to discuss surgical tmt option.  He agreed to call me s/p appt with his tmt decision. 5. ST SIM to be scheduled 1/25 to accommodate RT tmt decision, will be cancelled if he opts for surgery. 6. Of Note:  Lives alone, has girlfriend; quit smoking 2006, drinks an occasional beer monthly. 7. I encouraged them to contact me with questions/concerns as treatments/procedures begin.  He verbalized understanding of information provided.    Gayleen Orem, RN, BSN Head & Neck Oncology Nurse [place] at [place] [phone]

## 2018-01-22 ENCOUNTER — Telehealth: Payer: Self-pay | Admitting: *Deleted

## 2018-01-22 NOTE — Telephone Encounter (Signed)
Oncology Nurse Navigator Documentation  Rec'd call from Mr. Crumby s/p consult with Dr. Nicolette Bang [facility] 1/23.   He indicated he has decided to move forward with RT at [facility]. I informed him he as CT SIM appt tomorrow 11:00, encouraged 10:40 arrival for lobby registration. He voiced understanding.  Dr. Isidore Moos provided update.  Gayleen Orem, RN, BSN Head & Neck Oncology Nurse [place] at [place] [phone]

## 2018-01-23 ENCOUNTER — Ambulatory Visit: Payer: 59 | Admitting: Radiation Oncology

## 2018-01-26 ENCOUNTER — Encounter: Payer: Self-pay | Admitting: Radiation Oncology

## 2018-01-26 ENCOUNTER — Telehealth: Payer: Self-pay | Admitting: Radiation Oncology

## 2018-01-26 NOTE — Progress Notes (Signed)
I talked with Lolita Cram and discussed the risks of radiotherapy given prior radiation to the neck; RT is not absolutely contraindicated but I do favor surgery as this could be safer for him;  he is amenable to surgery. Gayleen Orem, RN, our Head and Neck Oncology Navigator will call  Dr. Servando Salina staff to ask them to arrange a f/u with him soon.  I left Dr. Nicolette Bang know that if for some reason the patient's cancer progresses rapidly before surgery and a laryngectomy is the only surgical option, RT could still be an option for larynx preservation.  -----------------------------------  Eppie Gibson, MD

## 2018-01-26 NOTE — Telephone Encounter (Signed)
I reviewed Mr. Theiler lung radiation plan and it appears that his right neck was treated with radiation.  While there was a laryngeal block, there will inevitably be overlap of radiation dose (even with IMRT) if he pursues radiation therapy for his glottic cancer, and this will somewhat heighten the risks of RT.  I spoke with the patient about this on 01-23-18 and then with Dr. Nicolette Bang, who feels that surgical cure is quite attainable w/ similar odds as radiotherapy. The main disadvantage of surgery would be worse voice quality than after RT.  Since then I've left two voice mails asking the patient to call me back to discuss how to move forward with treatment, and discuss the risks vs benefits of his options.  -----------------------------------  Eppie Gibson, MD

## 2018-01-28 DIAGNOSIS — E039 Hypothyroidism, unspecified: Secondary | ICD-10-CM | POA: Diagnosis present

## 2018-02-03 MED ORDER — TEMAZEPAM 15 MG PO CAPS
15.00 | ORAL_CAPSULE | ORAL | Status: DC
Start: ? — End: 2018-02-03

## 2018-02-03 MED ORDER — POLYETHYLENE GLYCOL 3350 17 G PO PACK
17.00 g | PACK | ORAL | Status: DC
Start: 2018-02-04 — End: 2018-02-03

## 2018-02-03 MED ORDER — GENERIC EXTERNAL MEDICATION
2.00 | Status: DC
Start: 2018-02-03 — End: 2018-02-03

## 2018-02-03 MED ORDER — ENOXAPARIN SODIUM 40 MG/0.4ML ~~LOC~~ SOLN
40.00 | SUBCUTANEOUS | Status: DC
Start: 2018-02-04 — End: 2018-02-03

## 2018-02-03 MED ORDER — LEVOTHYROXINE SODIUM 125 MCG PO TABS
62.50 | ORAL_TABLET | ORAL | Status: DC
Start: 2018-02-04 — End: 2018-02-03

## 2018-02-03 MED ORDER — OXYCODONE HCL 5 MG/5ML PO SOLN
5.00 | ORAL | Status: DC
Start: ? — End: 2018-02-03

## 2018-02-03 MED ORDER — ONDANSETRON HCL 4 MG/2ML IJ SOLN
4.00 | INTRAMUSCULAR | Status: DC
Start: ? — End: 2018-02-03

## 2018-02-03 MED ORDER — ACETAMINOPHEN 325 MG PO TABS
650.00 | ORAL_TABLET | ORAL | Status: DC
Start: 2018-02-03 — End: 2018-02-03

## 2018-02-03 MED ORDER — PROCHLORPERAZINE EDISYLATE 5 MG/ML IJ SOLN
5.00 | INTRAMUSCULAR | Status: DC
Start: ? — End: 2018-02-03

## 2018-02-03 MED ORDER — GENERIC EXTERNAL MEDICATION
40.00 | Status: DC
Start: 2018-02-03 — End: 2018-02-03

## 2018-02-03 MED ORDER — ALBUTEROL SULFATE (2.5 MG/3ML) 0.083% IN NEBU
2.50 | INHALATION_SOLUTION | RESPIRATORY_TRACT | Status: DC
Start: ? — End: 2018-02-03

## 2018-02-03 MED ORDER — RANITIDINE HCL 150 MG PO TABS
150.00 | ORAL_TABLET | ORAL | Status: DC
Start: 2018-02-03 — End: 2018-02-03

## 2018-02-03 MED ORDER — GENERIC EXTERNAL MEDICATION
2.00 | Status: DC
Start: ? — End: 2018-02-03

## 2018-02-03 MED ORDER — PRAVASTATIN SODIUM 40 MG PO TABS
40.00 | ORAL_TABLET | ORAL | Status: DC
Start: 2018-02-03 — End: 2018-02-03

## 2018-02-03 MED ORDER — DEXTROSE-NACL 5-0.45 % IV SOLN
INTRAVENOUS | Status: DC
Start: ? — End: 2018-02-03

## 2018-02-03 MED ORDER — MUPIROCIN 2 % EX OINT
TOPICAL_OINTMENT | CUTANEOUS | Status: DC
Start: 2018-02-03 — End: 2018-02-03

## 2018-03-10 ENCOUNTER — Ambulatory Visit: Payer: 59 | Admitting: Internal Medicine

## 2018-04-03 ENCOUNTER — Other Ambulatory Visit: Payer: Self-pay | Admitting: Internal Medicine

## 2018-04-03 DIAGNOSIS — R634 Abnormal weight loss: Secondary | ICD-10-CM

## 2018-04-03 DIAGNOSIS — R1310 Dysphagia, unspecified: Secondary | ICD-10-CM

## 2018-04-06 ENCOUNTER — Ambulatory Visit
Admission: RE | Admit: 2018-04-06 | Discharge: 2018-04-06 | Disposition: A | Discharge status: Home / Self Care | Payer: 59 | Source: Ambulatory Visit | Attending: Internal Medicine | Admitting: Internal Medicine

## 2018-04-06 DIAGNOSIS — R634 Abnormal weight loss: Secondary | ICD-10-CM

## 2018-04-06 DIAGNOSIS — R1310 Dysphagia, unspecified: Secondary | ICD-10-CM

## 2018-04-08 ENCOUNTER — Encounter: Payer: Self-pay | Admitting: Gastroenterology

## 2018-04-08 ENCOUNTER — Ambulatory Visit: Payer: 59 | Admitting: Gastroenterology

## 2018-04-08 VITALS — BP 100/70 | HR 72 | Ht 70.0 in | Wt 169.6 lb

## 2018-04-08 DIAGNOSIS — R630 Anorexia: Secondary | ICD-10-CM

## 2018-04-08 DIAGNOSIS — K219 Gastro-esophageal reflux disease without esophagitis: Secondary | ICD-10-CM | POA: Diagnosis not present

## 2018-04-08 DIAGNOSIS — R634 Abnormal weight loss: Secondary | ICD-10-CM

## 2018-04-08 MED ORDER — OMEPRAZOLE 40 MG PO CPDR: 40.0000 mg | DELAYED_RELEASE_CAPSULE | Freq: Every day | ORAL | 11 refills | Status: AC

## 2018-04-08 NOTE — Progress Notes (Signed)
    History of Present Illness: This is a 62 year old male status post laryngectomy in February for squamous cell cancer of the left vocal cord.  He relates difficulty swallowing no appetite since his surgery for several weeks however the past week his appetite has improved and his intake of food has improved.  He had following surgery due to his surgery and was initially on a liquid diet and he has been advanced to a soft diet and is tolerating it fairly well.  He noted intermittent regurgitation and reflux symptoms a few weeks ago.  He takes Prilosec OTC as needed.  He is being treated empirically for possible esophageal candidiasis by Dr. Joylene Draft.  Barium esophagram as below.  04/06/2018 Barium esophagram:  1. Negative for esophageal narrowing or obstruction. 2. 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 3. Gastroesophageal reflux was observed, although this occurred during belching. 4. Normal gastric emptying noted  Colonoscopy 04/2015 1. Sessile polyp in the transverse colon; polypectomy performed with cold forceps 2. Sessile polyp in the sigmoid colon; polypectomy performed with a cold snare 3. Moderate diverticulosis in the descending colon and sigmoid colon 4. Mild diverticulosis in the transverse colon, ascending colon, and at the cecum 5. Grade l internal hemorrhoids One adenoma, one hyperplastic polyp  Current Medications, Allergies, Past Medical History, Past Surgical History, Family History and Social History were reviewed in [employer] record.  Physical Exam: General: Well developed, thin, chronically ill appearing, no acute distress Head: Normocephalic and atraumatic Eyes:  sclerae anicteric, EOMI Ears: Normal auditory acuity Mouth: No deformity or lesions Lungs: Clear throughout to auscultation with decreased BS bilaterally Heart: Regular rate and  rhythm; no murmurs, rubs or bruits Abdomen: Soft, non tender and non distended. No masses, hepatosplenomegaly or hernias noted. Normal Bowel sounds Rectal: not done Musculoskeletal: Symmetrical with no gross deformities  Pulses:  Normal pulses noted Extremities: No clubbing, cyanosis, edema or deformities noted Neurological: Alert oriented x 4, grossly nonfocal Psychological:  Alert and cooperative. Normal mood and affect  Assessment and Recommendations:  1.  Anorexia, dysphagia, weight loss following partial laryngectomy on February 4 for squamous cell cancer.    GERD, possible Candida esophagitis along with a prolonged postoperative recovery caused his symptoms.  His dysphagia, likely oral pharyngeal, has continued to improve, consistent with recovery from his surgery.  He may have a component of esophageal dysphasia related to motility abnormalities noted on barium esophagram.  His appetite has improved over the past several days.  He feels he has begun to gain some of the weight he lost.     Closely follow antireflux measures. GERD, possible Candida esophagitis along with a prolonged postoperative recovery has led to his current symptoms.  Begin omeprazole 40 mg daily for the next 2 months, not as needed.  Complete course of Diflucan as prescribed.  He was advised to have 3 meals/day and to avoid eating for at least 3 hours prior to recumbency.  He should take a liquid nutritional supplement such as Ensure or Boost in place of a meal even if he is not hungry. If symptoms do not continue to improve we will plan to proceed with EGD for further evaluation.  I have advised him to contact us by phone next week to report his progress.

## 2018-04-08 NOTE — Patient Instructions (Signed)
We have sent the following medications to your pharmacy for you to pick up at your convenience: omeprazole 40 mg daily.   Finish your diflucan course.   Call back in one week and ask to speak to [facility] which is Dr. Lynne Leader nurse for an update on your symptoms.   Normal BMI (Body Mass Index- based on height and weight) is between 19 and 25. Your BMI today is Body mass index is 24.34 kg/m. Marland Kitchen Please consider follow up  regarding your BMI with your Primary Care Provider.  Thank you for choosing me and [facility].  Pricilla Riffle. Dagoberto Ligas., MD., Marval Regal

## 2018-05-04 ENCOUNTER — Inpatient Hospital Stay (HOSPITAL_COMMUNITY)
Admission: EM | Admit: 2018-05-04 | Discharge: 2018-05-07 | DRG: 190 | Disposition: A | Discharge status: Home / Self Care | Payer: Medicare Other | Attending: Internal Medicine | Admitting: Internal Medicine

## 2018-05-04 ENCOUNTER — Emergency Department (HOSPITAL_COMMUNITY): Payer: Medicare Other

## 2018-05-04 ENCOUNTER — Encounter (HOSPITAL_COMMUNITY): Payer: Self-pay | Admitting: *Deleted

## 2018-05-04 ENCOUNTER — Other Ambulatory Visit: Payer: Self-pay

## 2018-05-04 DIAGNOSIS — Z902 Acquired absence of lung [part of]: Secondary | ICD-10-CM

## 2018-05-04 DIAGNOSIS — C320 Malignant neoplasm of glottis: Secondary | ICD-10-CM | POA: Diagnosis present

## 2018-05-04 DIAGNOSIS — Z825 Family history of asthma and other chronic lower respiratory diseases: Secondary | ICD-10-CM

## 2018-05-04 DIAGNOSIS — Z7982 Long term (current) use of aspirin: Secondary | ICD-10-CM

## 2018-05-04 DIAGNOSIS — E559 Vitamin D deficiency, unspecified: Secondary | ICD-10-CM | POA: Diagnosis present

## 2018-05-04 DIAGNOSIS — Z7951 Long term (current) use of inhaled steroids: Secondary | ICD-10-CM

## 2018-05-04 DIAGNOSIS — F419 Anxiety disorder, unspecified: Secondary | ICD-10-CM | POA: Diagnosis present

## 2018-05-04 DIAGNOSIS — Z885 Allergy status to narcotic agent status: Secondary | ICD-10-CM

## 2018-05-04 DIAGNOSIS — Z79890 Hormone replacement therapy (postmenopausal): Secondary | ICD-10-CM

## 2018-05-04 DIAGNOSIS — T380X5A Adverse effect of glucocorticoids and synthetic analogues, initial encounter: Secondary | ICD-10-CM | POA: Diagnosis present

## 2018-05-04 DIAGNOSIS — R778 Other specified abnormalities of plasma proteins: Secondary | ICD-10-CM | POA: Diagnosis present

## 2018-05-04 DIAGNOSIS — Z85118 Personal history of other malignant neoplasm of bronchus and lung: Secondary | ICD-10-CM

## 2018-05-04 DIAGNOSIS — Z818 Family history of other mental and behavioral disorders: Secondary | ICD-10-CM

## 2018-05-04 DIAGNOSIS — Z888 Allergy status to other drugs, medicaments and biological substances status: Secondary | ICD-10-CM

## 2018-05-04 DIAGNOSIS — R7989 Other specified abnormal findings of blood chemistry: Secondary | ICD-10-CM | POA: Diagnosis present

## 2018-05-04 DIAGNOSIS — M810 Age-related osteoporosis without current pathological fracture: Secondary | ICD-10-CM | POA: Diagnosis present

## 2018-05-04 DIAGNOSIS — J441 Chronic obstructive pulmonary disease with (acute) exacerbation: Secondary | ICD-10-CM | POA: Diagnosis not present

## 2018-05-04 DIAGNOSIS — Z8249 Family history of ischemic heart disease and other diseases of the circulatory system: Secondary | ICD-10-CM

## 2018-05-04 DIAGNOSIS — H5462 Unqualified visual loss, left eye, normal vision right eye: Secondary | ICD-10-CM | POA: Diagnosis present

## 2018-05-04 DIAGNOSIS — Z923 Personal history of irradiation: Secondary | ICD-10-CM

## 2018-05-04 DIAGNOSIS — I248 Other forms of acute ischemic heart disease: Secondary | ICD-10-CM | POA: Diagnosis present

## 2018-05-04 DIAGNOSIS — K219 Gastro-esophageal reflux disease without esophagitis: Secondary | ICD-10-CM | POA: Diagnosis present

## 2018-05-04 DIAGNOSIS — Z9221 Personal history of antineoplastic chemotherapy: Secondary | ICD-10-CM

## 2018-05-04 DIAGNOSIS — D72829 Elevated white blood cell count, unspecified: Secondary | ICD-10-CM | POA: Diagnosis present

## 2018-05-04 DIAGNOSIS — Z9981 Dependence on supplemental oxygen: Secondary | ICD-10-CM

## 2018-05-04 DIAGNOSIS — E785 Hyperlipidemia, unspecified: Secondary | ICD-10-CM | POA: Diagnosis present

## 2018-05-04 DIAGNOSIS — C3490 Malignant neoplasm of unspecified part of unspecified bronchus or lung: Secondary | ICD-10-CM | POA: Diagnosis present

## 2018-05-04 DIAGNOSIS — C3491 Malignant neoplasm of unspecified part of right bronchus or lung: Secondary | ICD-10-CM | POA: Diagnosis present

## 2018-05-04 DIAGNOSIS — R0603 Acute respiratory distress: Secondary | ICD-10-CM

## 2018-05-04 DIAGNOSIS — J9621 Acute and chronic respiratory failure with hypoxia: Secondary | ICD-10-CM | POA: Diagnosis present

## 2018-05-04 DIAGNOSIS — Z87891 Personal history of nicotine dependence: Secondary | ICD-10-CM

## 2018-05-04 DIAGNOSIS — J9601 Acute respiratory failure with hypoxia: Secondary | ICD-10-CM | POA: Diagnosis present

## 2018-05-04 DIAGNOSIS — R0602 Shortness of breath: Secondary | ICD-10-CM | POA: Diagnosis not present

## 2018-05-04 DIAGNOSIS — E039 Hypothyroidism, unspecified: Secondary | ICD-10-CM | POA: Diagnosis present

## 2018-05-04 DIAGNOSIS — Z8521 Personal history of malignant neoplasm of larynx: Secondary | ICD-10-CM

## 2018-05-04 DIAGNOSIS — F329 Major depressive disorder, single episode, unspecified: Secondary | ICD-10-CM | POA: Diagnosis present

## 2018-05-04 DIAGNOSIS — I452 Bifascicular block: Secondary | ICD-10-CM | POA: Diagnosis present

## 2018-05-04 DIAGNOSIS — Z79899 Other long term (current) drug therapy: Secondary | ICD-10-CM

## 2018-05-04 DIAGNOSIS — I252 Old myocardial infarction: Secondary | ICD-10-CM

## 2018-05-04 DIAGNOSIS — I2510 Atherosclerotic heart disease of native coronary artery without angina pectoris: Secondary | ICD-10-CM

## 2018-05-04 DIAGNOSIS — I4510 Unspecified right bundle-branch block: Secondary | ICD-10-CM | POA: Diagnosis present

## 2018-05-04 LAB — CBC
HEMATOCRIT: 43.7 % (ref 39.0–52.0)
Hemoglobin: 14.3 g/dL (ref 13.0–17.0)
MCH: 27.6 pg (ref 26.0–34.0)
MCHC: 32.7 g/dL (ref 30.0–36.0)
MCV: 84.4 fL (ref 78.0–100.0)
Platelets: 326 10*3/uL (ref 150–400)
RBC: 5.18 MIL/uL (ref 4.22–5.81)
RDW: 13.2 % (ref 11.5–15.5)
WBC: 23.5 10*3/uL — AB (ref 4.0–10.5)

## 2018-05-04 LAB — BASIC METABOLIC PANEL
ANION GAP: 13 (ref 5–15)
BUN: 12 mg/dL (ref 6–20)
CHLORIDE: 96 mmol/L — AB (ref 101–111)
CO2: 28 mmol/L (ref 22–32)
Calcium: 8.8 mg/dL — ABNORMAL LOW (ref 8.9–10.3)
Creatinine, Ser: 0.85 mg/dL (ref 0.61–1.24)
Glucose, Bld: 201 mg/dL — ABNORMAL HIGH (ref 65–99)
POTASSIUM: 3.4 mmol/L — AB (ref 3.5–5.1)
SODIUM: 137 mmol/L (ref 135–145)

## 2018-05-04 LAB — I-STAT TROPONIN, ED: Troponin i, poc: 0.12 ng/mL (ref 0.00–0.08)

## 2018-05-04 MED ORDER — IOPAMIDOL (ISOVUE-370) INJECTION 76%
INTRAVENOUS | Status: AC
Start: 2018-05-04 — End: 2018-05-05
  Filled 2018-05-04: qty 100

## 2018-05-04 MED ORDER — DEXAMETHASONE SODIUM PHOSPHATE 10 MG/ML IJ SOLN
10.0000 mg | Freq: Once | INTRAMUSCULAR | Status: AC
  Administered 2018-05-04: 10 mg via INTRAVENOUS
  Filled 2018-05-04: qty 1

## 2018-05-04 MED ORDER — IOPAMIDOL (ISOVUE-370) INJECTION 76%
INTRAVENOUS | Status: AC
  Filled 2018-05-04: qty 100

## 2018-05-04 MED ORDER — EPINEPHRINE 0.3 MG/0.3ML IJ SOAJ
INTRAMUSCULAR | Status: AC
  Administered 2018-05-04: 0.3 mg via INTRAMUSCULAR
  Filled 2018-05-04: qty 0.3

## 2018-05-04 MED ORDER — MAGNESIUM SULFATE 2 GM/50ML IV SOLN
2.0000 g | Freq: Once | INTRAVENOUS | Status: AC
  Administered 2018-05-04: 2 g via INTRAVENOUS
  Filled 2018-05-04: qty 50

## 2018-05-04 MED ORDER — RACEPINEPHRINE HCL 2.25 % IN NEBU
1.0000 mL | INHALATION_SOLUTION | Freq: Once | RESPIRATORY_TRACT | Status: AC
Start: 2018-05-04 — End: 2018-05-04
  Administered 2018-05-04: 1 mL via RESPIRATORY_TRACT

## 2018-05-04 MED ORDER — ASPIRIN 81 MG PO CHEW
324.0000 mg | CHEWABLE_TABLET | Freq: Once | ORAL | Status: AC
  Administered 2018-05-04: 324 mg via ORAL
  Filled 2018-05-04: qty 4

## 2018-05-04 MED ORDER — RACEPINEPHRINE HCL 2.25 % IN NEBU
INHALATION_SOLUTION | RESPIRATORY_TRACT | Status: AC
  Administered 2018-05-04: 1 mL via RESPIRATORY_TRACT
  Filled 2018-05-04: qty 0.5

## 2018-05-04 MED ORDER — IOPAMIDOL (ISOVUE-370) INJECTION 76%
100.0000 mL | Freq: Once | INTRAVENOUS | Status: AC | PRN
  Administered 2018-05-04: 100 mL via INTRAVENOUS

## 2018-05-04 NOTE — ED Notes (Signed)
Pt titrated off the NIV to a [place] tolerating it well at this time. BBS to ausculation decrease aeration throughout no wheezing noted.

## 2018-05-04 NOTE — ED Triage Notes (Addendum)
Pt arrived by EMS for sudden onset of sob. Pt reports going outside and felt like he was having a panic attack. EMS arrived to find pt tachypneic, diaphoretic, with labored breathing. 125mg  solumdrol, 15mg  albuterol, and 1mg  atrovent given enroute. Cpap in place on arrival. EMS activated code stemi enroute for inferior elevation, stemi cancelled. Hx of cancer, lung surgery,  And trach (Feb 2019).

## 2018-05-04 NOTE — ED Provider Notes (Signed)
[facility] EMERGENCY DEPARTMENT Provider Note   CSN: [id number] Arrival date & time: 05/04/18  1919     History   Chief Complaint Chief Complaint  Patient presents with  . Shortness of Breath    HPI Stephen [name] is a 62 y.o. male.  HPI  The patient is a toxic appearing 62 year old male who presents with severe respiratory distress after he went outside to deal with some lawn equipment.  His respiratory status immediately declined and he became acutely dyspneic feeling like he could not breathe.  He feel like his voice was hoarse.  He was told by the paramedics to be in a kneeling position on the ground as if he could not breathe.  He was hypoxic tachypneic using accessory muscles and looking severely ill.  He required 3 treatments of albuterol and BiPAP and states that this did help somewhat.  When I take the patient off BiPAP in the room he is able to speak in 3-4 word sentences but is severely dyspneic and using accessory muscles.  Level 5 caveat applies secondary to this condition.  He does deny chest discomfort, has no history of cardiac disease.  Past Medical History:  Diagnosis Date  . Allergic rhinitis, cause unspecified   . Anxiety   . Blindness of left eye   . Complication of anesthesia    difficulty awaking after colonoscopy   . Depression   . Dyspnea   . GERD (gastroesophageal reflux disease)   . Hypothyroidism   . Lung cancer ([place])    lung ca dx 06, has had chemo and radiation  . Osteoporosis   . Other emphysema ([place])    copd  . 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 Thrombosed hemorrhoids   . Tubular adenoma of colon 2009  . Unspecified vitamin D deficiency     Patient Active Problem List   Diagnosis Date Noted  . 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 Alpha-1-antitrypsin deficiency carrier ([place]) 07/17/2016  . Eosinophilia 05/30/2016  . History of seasonal allergies 05/30/2016  . Stopped smoking with greater than  40 pack year history 05/30/2016  . History of lobectomy of lung 05/30/2016  . COLD (chronic obstructive lung disease) ([place]) 07/27/2015  . History of elevated PSA 07/27/2015  . COPD exacerbation ([place]) 01/23/2015  . COPD, severe ([place]) 01/12/2015  . Patient in clinical research study 01/12/2015  . Research study patient 10/07/2014  . Need for prophylactic vaccination against Streptococcus pneumoniae (pneumococcus) 09/15/2014  . Chest pain 08/09/2013  . History of TIA (transient ischemic attack) 08/09/2013  . Otitis media of left ear 08/09/2013  . 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 Hemorrhoids, external, thrombosed 08/26/2011  . BACK PAIN 02/22/2011  . COPD (chronic obstructive pulmonary disease) ([place]) 02/06/2011  . ROTATOR CUFF TEAR 02/15/2010  . ALLERGIC RHINITIS 04/15/2008    Past Surgical History:  Procedure Laterality Date  . COLONOSCOPY    . HERNIA REPAIR     as a baby- not sure where hernia was  . KNEE SURGERY Left 1972   .  has hardware  . LUNG LOBECTOMY     RUL  . MICROLARYNGOSCOPY WITH CO2 LASER AND EXCISION OF VOCAL CORD LESION N/A 12/19/2017   Procedure: MICROLARYNGOSCOPY WITH BIOPSY;  Surgeon: Melida Quitter, MD;  Location: [facility];  Service: ENT;  Laterality: N/A;  . ROTATOR CUFF REPAIR Left   . SHOULDER SURGERY Right    rotator cuff  .  THROAT SURGERY     Laser surgery for throat cancer  . TRANSTHORACIC ECHOCARDIOGRAM  01/13/2012   EF=>55%; trace MR/TR;         Home Medications    Prior to Admission medications   Medication Sig Start Date End Date Taking? Authorizing Provider  albuterol (PROVENTIL HFA;VENTOLIN HFA) 108 (90 Base) MCG/ACT inhaler Inhale 1-2 puffs into the lungs every 6 (six) hours as needed for wheezing or shortness of breath.    [provider]  albuterol (PROVENTIL) (2.5 MG/3ML) 0.083% nebulizer solution Take 2.5 mg by nebulization 3 (three) times daily as needed for wheezing or shortness of breath.     [provider]  aspirin 81 MG chewable tablet Chew 81 mg by mouth daily.    [provider]  benzonatate (TESSALON) 100 MG capsule Take 100 mg by mouth every 8 (eight) hours as needed for cough. 03/19/18   [provider]  Fluticasone-Umeclidin-Vilant (TRELEGY ELLIPTA) 100-62.5-25 MCG/INH AEPB Inhale 1 puff into the lungs daily. 08/13/17   Brand Males, MD  levothyroxine (SYNTHROID, LEVOTHROID) 125 MCG tablet Take 62.5 mcg by mouth daily before breakfast.     [provider]  mirtazapine (REMERON) 15 MG tablet Take 15 mg by mouth every evening. 04/15/18   [provider]  nitroGLYCERIN (NITROSTAT) 0.4 MG SL tablet Place 1 tablet (0.4 mg total) under the tongue every 5 (five) minutes as needed for chest pain. 08/09/13   Delfina Redwood, MD  omeprazole (PRILOSEC) 40 MG capsule Take 1 capsule (40 mg total) by mouth daily. 04/08/18   Ladene Artist, MD  pravastatin (PRAVACHOL) 40 MG tablet Take 40 mg by mouth at bedtime.     [provider]  temazepam (RESTORIL) 15 MG capsule Take 30 mg by mouth at bedtime. 04/19/18   [provider]  temazepam (RESTORIL) 30 MG capsule Take 30 mg by mouth at bedtime.     [provider]  Vitamin D, Ergocalciferol, (DRISDOL) 50000 UNITS CAPS Take 50,000 Units by mouth every 14 (fourteen) days.  05/07/11   [provider]    Family History Family History  Problem Relation Age of Onset  . Cancer Mother        Brain tumor  . COPD Mother   . Heart disease Father        CABG  . Prostate cancer Father   . Cystic fibrosis Sister        also heart failure  . Mental illness Brother   . Heart failure Paternal Grandmother   . Colon cancer Neg Hx   . Colon polyps Neg Hx   . Esophageal cancer Neg Hx     Social History Social History   Tobacco Use  . Smoking status: Former Smoker    Packs/day: 1.50    Years: 32.00    Pack years: 48.00    Types: Cigarettes    Last attempt to quit:  12/30/2004    Years since quitting: 13.3  . Smokeless tobacco: Never Used  . Tobacco comment: 1 ppd x 30 years  Substance Use Topics  . Alcohol use: Yes    Comment: 1- 2 beers monthly  . Drug use: No     Allergies   Codeine; Cymbalta [duloxetine hcl]; and Montelukast sodium   Review of Systems Review of Systems  Unable to perform ROS: Acuity of condition     Physical Exam Updated Vital Signs BP 97/71   Pulse 95   Temp 98.9 F (37.2 C) (  Temporal)   Resp 20   SpO2 99%   Physical Exam  Constitutional: He appears well-developed. He appears toxic. He appears ill. No distress.  HENT:  Head: Normocephalic and atraumatic.  Mouth/Throat: Oropharynx is clear and moist. No oropharyngeal exudate.  Eyes: Pupils are equal, round, and reactive to light. Conjunctivae and EOM are normal. Right eye exhibits no discharge. Left eye exhibits no discharge. No scleral icterus.  Neck: Normal range of motion. Neck supple. No JVD present. No thyromegaly present.  Prior tracheotomy scar well-healed  Cardiovascular: Regular rhythm, normal heart sounds and intact distal pulses. Exam reveals no gallop and no friction rub.  No murmur heard. Tachycardic to 135 bpm, normal pulses, no JVD  Pulmonary/Chest: He is in respiratory distress. He has no wheezes. He has no rales.  [facility] patient has stridor, severe respiratory distress, speaking in 2-3 word sentences, having significant retractions supraclavicular  Abdominal: Soft. Bowel sounds are normal. He exhibits no distension and no mass. There is no tenderness.  Musculoskeletal: Normal range of motion. He exhibits no edema or tenderness.  Lymphadenopathy:    He has no cervical adenopathy.  Neurological: He is alert. Coordination normal.  Skin: Skin is warm and dry. No rash noted. No erythema.  Psychiatric: He has a normal mood and affect. His behavior is normal.  Nursing note and vitals reviewed.    ED Treatments / Results  Labs (all labs  ordered are listed, but only abnormal results are displayed) Labs Reviewed  BASIC METABOLIC PANEL - Abnormal; Notable for the following components:      Result Value   Potassium 3.4 (*)    Chloride 96 (*)    Glucose, Bld 201 (*)    Calcium 8.8 (*)    All other components within normal limits  CBC - Abnormal; Notable for the following components:   WBC 23.5 (*)    All other components within normal limits  I-STAT TROPONIN, ED - Abnormal; Notable for the following components:   Troponin i, poc 0.12 (*)    All other components within normal limits    EKG EKG Interpretation  Date/Time:  Monday May 04 2018 19:23:18 EDT Ventricular Rate:  135 PR Interval:    QRS Duration: 131 QT Interval:  398 QTC Calculation: 597 R Axis:   -74 Text Interpretation:  Sinus tachycardia RBBB and LAFB Probable posterior infarct, acute Artifact in lead(s) I III aVR aVL aVF V2 V3 V4 V5 V6 new RBB since last, tachycardic, Non spec T wave abn Abnormal ekg Confirmed by Noemi Chapel [phone]) on 05/04/2018 7:35:19 PM   Radiology Ct Angio Chest Pe W And/or Wo Contrast  Result Date: 05/04/2018 CLINICAL DATA:  62 year old male with sudden onset shortness of breath. Concern for pulmonary embolism. History of right-sided lung cancer treated with right upper lobectomy and chemo and radiation therapy. EXAM: CT ANGIOGRAPHY CHEST WITH CONTRAST TECHNIQUE: Multidetector CT imaging of the chest was performed using the standard protocol during bolus administration of intravenous contrast. Multiplanar CT image reconstructions and MIPs were obtained to evaluate the vascular anatomy. CONTRAST:  ISOVUE-370 IOPAMIDOL (ISOVUE-370) INJECTION 76% COMPARISON:  Chest radiograph dated 05/04/2018 and CT dated 06/09/2017 FINDINGS: Cardiovascular: There is no cardiomegaly. There is a small pericardial effusion which has increased in size compared to the prior CT and measures 2 cm in thickness anterior to the heart. Echocardiogram may provide  better evaluation. The thoracic aorta is unremarkable. There is no CT evidence of pulmonary embolism. Mediastinum/Nodes: There is no hilar or mediastinal adenopathy.  There is a small hiatal hernia. The esophagus is grossly unremarkable. No mediastinal fluid collection. Lungs/Pleura: Postsurgical changes of right upper lobectomy with compensatory hyperexpansion of the right middle and lower lobe. Right apical masslike density appears similar to prior CT and most consistent with postsurgical changes. There is associated traction and mild shift of the mediastinum into the right hemithorax similar to prior CT. There is a background of emphysema. No new consolidative changes or suspicious lesions. There is no pleural effusion or pneumothorax. The central airways are patent. Upper Abdomen: No acute abnormality. Musculoskeletal: Postsurgical changes of right chest wall thoracotomy. No acute osseous pathology. No suspicious bone lesions. Review of the MIP images confirms the above findings. IMPRESSION: 1. No acute intrathoracic pathology. No CT evidence of pulmonary embolism. 2. Postsurgical changes of right upper lobectomy as seen on the prior CT. No new masses or evidence of recurrent disease. 3. COPD. 4. Small pericardial effusion, increased compared to the prior chest CT. Electronically Signed   By: Anner Crete M.D.   On: 05/04/2018 23:27   Dg Chest Portable 1 View  Result Date: 05/04/2018 CLINICAL DATA:  Short of breath EXAM: PORTABLE CHEST 1 VIEW COMPARISON:  03/20/2016 FINDINGS: Pleural thickening at the right apex with fibrotic and postoperative changes and volume loss in the right lung are stable. No pneumothorax. Left lung is hyperaerated and clear. Normal heart size. IMPRESSION: Chronic changes in the right hemithorax. No active cardiopulmonary disease. Electronically Signed   By: Marybelle Killings M.D.   On: 05/04/2018 20:14    Procedures .Critical Care Performed by: Noemi Chapel, MD Authorized by:  Noemi Chapel, MD   Critical care provider statement:    Critical care time (minutes):  35   Critical care time was exclusive of:  Separately billable procedures and treating other patients and teaching time   Critical care was necessary to treat or prevent imminent or life-threatening deterioration of the following conditions:  Cardiac failure and respiratory failure   Critical care was time spent personally by me on the following activities:  Blood draw for specimens, development of treatment plan with patient or surrogate, discussions with consultants, evaluation of patient's response to treatment, examination of patient, obtaining history from patient or surrogate, ordering and performing treatments and interventions, ordering and review of laboratory studies, ordering and review of radiographic studies, pulse oximetry, re-evaluation of patient's condition and review of old charts   (including critical care time)  Medications Ordered in ED Medications  iopamidol (ISOVUE-370) 76 % injection (has no administration in time range)  EPINEPHrine (EPI-PEN) 0.3 mg/0.3 mL injection (0.3 mg Intramuscular Given 05/04/18 1924)  magnesium sulfate IVPB 2 g 50 mL (0 g Intravenous Stopped 05/04/18 2102)  Racepinephrine HCl 2.25 % nebulizer solution 1 mL (1 mL Nebulization Given 05/04/18 1941)  iopamidol (ISOVUE-370) 76 % injection 100 mL (100 mLs Intravenous Contrast Given 05/04/18 2254)  aspirin chewable tablet 324 mg (324 mg Oral Given 05/04/18 2327)  dexamethasone (DECADRON) injection 10 mg (10 mg Intravenous Given 05/04/18 2345)     Initial Impression / Assessment and Plan / ED Course  I have reviewed the triage vital signs and the nursing notes.  Pertinent labs & imaging results that were available during my care of the patient were reviewed by me and considered in my medical decision making (see chart for details).     The patient appears in acute respiratory distress, this appears to be more of an upper  airway issue, consider allergic reaction, anaphylaxis, severe COPD, this  could also be cardiac though his EKG shows a right bundle branch block and tachycardia and not likely to be a STEMI.  I discussed his care with cardiologist Dr. Peter Martinique who is in agreement.  The patient was given epinephrine, Solu-Medrol was given prehospital, multiple nebs given, he is back on BiPAP, vital signs appear better at this time.  Would consider the need for intubation if the patient does not improve with his significant stridor.  After Bipap and epinephrine IM, racemic epi neb and continuous albuterol - he has improved and is now off bipap but still having some difficulty with breahting  CT is neg for acute PE or Pneumonia.   His trop is elevated -  D/w Dr. Aundra Dubin of Cardiology re: abnormal EKG and elevated troponin - agrees with hospitalist admission.  Paged hospitalist. - discussed care with Dr. Myna Hidalgo who will admit  Critical care provided.  Final Clinical Impressions(s) / ED Diagnoses   Final diagnoses:  Acute respiratory distress  Elevated troponin      Noemi Chapel, MD 05/04/18 2354

## 2018-05-05 ENCOUNTER — Other Ambulatory Visit: Payer: Self-pay

## 2018-05-05 ENCOUNTER — Encounter (HOSPITAL_COMMUNITY): Payer: Self-pay | Admitting: General Practice

## 2018-05-05 ENCOUNTER — Other Ambulatory Visit (HOSPITAL_COMMUNITY): Payer: 59

## 2018-05-05 DIAGNOSIS — I452 Bifascicular block: Secondary | ICD-10-CM | POA: Diagnosis present

## 2018-05-05 DIAGNOSIS — C3491 Malignant neoplasm of unspecified part of right bronchus or lung: Secondary | ICD-10-CM

## 2018-05-05 DIAGNOSIS — E785 Hyperlipidemia, unspecified: Secondary | ICD-10-CM | POA: Diagnosis present

## 2018-05-05 DIAGNOSIS — Z923 Personal history of irradiation: Secondary | ICD-10-CM | POA: Diagnosis not present

## 2018-05-05 DIAGNOSIS — R748 Abnormal levels of other serum enzymes: Secondary | ICD-10-CM | POA: Diagnosis not present

## 2018-05-05 DIAGNOSIS — I4510 Unspecified right bundle-branch block: Secondary | ICD-10-CM | POA: Diagnosis present

## 2018-05-05 DIAGNOSIS — I248 Other forms of acute ischemic heart disease: Secondary | ICD-10-CM | POA: Diagnosis present

## 2018-05-05 DIAGNOSIS — I252 Old myocardial infarction: Secondary | ICD-10-CM | POA: Diagnosis not present

## 2018-05-05 DIAGNOSIS — J441 Chronic obstructive pulmonary disease with (acute) exacerbation: Principal | ICD-10-CM

## 2018-05-05 DIAGNOSIS — Z85118 Personal history of other malignant neoplasm of bronchus and lung: Secondary | ICD-10-CM | POA: Diagnosis not present

## 2018-05-05 DIAGNOSIS — D72829 Elevated white blood cell count, unspecified: Secondary | ICD-10-CM | POA: Diagnosis present

## 2018-05-05 DIAGNOSIS — I25119 Atherosclerotic heart disease of native coronary artery with unspecified angina pectoris: Secondary | ICD-10-CM | POA: Diagnosis not present

## 2018-05-05 DIAGNOSIS — R0602 Shortness of breath: Secondary | ICD-10-CM | POA: Diagnosis present

## 2018-05-05 DIAGNOSIS — I2510 Atherosclerotic heart disease of native coronary artery without angina pectoris: Secondary | ICD-10-CM | POA: Diagnosis present

## 2018-05-05 DIAGNOSIS — F329 Major depressive disorder, single episode, unspecified: Secondary | ICD-10-CM | POA: Diagnosis present

## 2018-05-05 DIAGNOSIS — I5030 Unspecified diastolic (congestive) heart failure: Secondary | ICD-10-CM | POA: Diagnosis not present

## 2018-05-05 DIAGNOSIS — K219 Gastro-esophageal reflux disease without esophagitis: Secondary | ICD-10-CM | POA: Diagnosis present

## 2018-05-05 DIAGNOSIS — R079 Chest pain, unspecified: Secondary | ICD-10-CM | POA: Diagnosis not present

## 2018-05-05 DIAGNOSIS — J9621 Acute and chronic respiratory failure with hypoxia: Secondary | ICD-10-CM | POA: Diagnosis present

## 2018-05-05 DIAGNOSIS — Z9981 Dependence on supplemental oxygen: Secondary | ICD-10-CM | POA: Diagnosis not present

## 2018-05-05 DIAGNOSIS — T380X5A Adverse effect of glucocorticoids and synthetic analogues, initial encounter: Secondary | ICD-10-CM | POA: Diagnosis present

## 2018-05-05 DIAGNOSIS — Z9221 Personal history of antineoplastic chemotherapy: Secondary | ICD-10-CM | POA: Diagnosis not present

## 2018-05-05 DIAGNOSIS — J9601 Acute respiratory failure with hypoxia: Secondary | ICD-10-CM

## 2018-05-05 DIAGNOSIS — Z8521 Personal history of malignant neoplasm of larynx: Secondary | ICD-10-CM | POA: Diagnosis not present

## 2018-05-05 DIAGNOSIS — C320 Malignant neoplasm of glottis: Secondary | ICD-10-CM

## 2018-05-05 DIAGNOSIS — E039 Hypothyroidism, unspecified: Secondary | ICD-10-CM | POA: Diagnosis present

## 2018-05-05 DIAGNOSIS — E559 Vitamin D deficiency, unspecified: Secondary | ICD-10-CM | POA: Diagnosis present

## 2018-05-05 DIAGNOSIS — Z902 Acquired absence of lung [part of]: Secondary | ICD-10-CM | POA: Diagnosis not present

## 2018-05-05 DIAGNOSIS — Z87891 Personal history of nicotine dependence: Secondary | ICD-10-CM | POA: Diagnosis not present

## 2018-05-05 DIAGNOSIS — F419 Anxiety disorder, unspecified: Secondary | ICD-10-CM | POA: Diagnosis present

## 2018-05-05 DIAGNOSIS — H5462 Unqualified visual loss, left eye, normal vision right eye: Secondary | ICD-10-CM | POA: Diagnosis present

## 2018-05-05 DIAGNOSIS — M810 Age-related osteoporosis without current pathological fracture: Secondary | ICD-10-CM | POA: Diagnosis present

## 2018-05-05 LAB — BASIC METABOLIC PANEL
Anion gap: 12 (ref 5–15)
BUN: 14 mg/dL (ref 6–20)
CHLORIDE: 97 mmol/L — AB (ref 101–111)
CO2: 27 mmol/L (ref 22–32)
CREATININE: 0.92 mg/dL (ref 0.61–1.24)
Calcium: 8.6 mg/dL — ABNORMAL LOW (ref 8.9–10.3)
GFR calc Af Amer: >60 mL/min (ref >60)
Glucose, Bld: 219 mg/dL — ABNORMAL HIGH (ref 65–99)
Potassium: 3.5 mmol/L (ref 3.5–5.1)
SODIUM: 136 mmol/L (ref 135–145)

## 2018-05-05 LAB — CBC WITH DIFFERENTIAL/PLATELET
BASOS ABS: 0 10*3/uL (ref 0.0–0.1)
Basophils Relative: 0 %
EOS ABS: 0 10*3/uL (ref 0.0–0.7)
EOS PCT: 0 %
HCT: 40.5 % (ref 39.0–52.0)
HEMOGLOBIN: 13 g/dL (ref 13.0–17.0)
LYMPHS ABS: 0.3 10*3/uL — AB (ref 0.7–4.0)
Lymphocytes Relative: 3 %
MCH: 27 pg (ref 26.0–34.0)
MCHC: 32.1 g/dL (ref 30.0–36.0)
MCV: 84.2 fL (ref 78.0–100.0)
Monocytes Absolute: 0.1 10*3/uL (ref 0.1–1.0)
Monocytes Relative: 1 %
NEUTROS PCT: 96 %
Neutro Abs: 11.5 10*3/uL — ABNORMAL HIGH (ref 1.7–7.7)
PLATELETS: 259 10*3/uL (ref 150–400)
RBC: 4.81 MIL/uL (ref 4.22–5.81)
RDW: 13.3 % (ref 11.5–15.5)
WBC: 11.9 10*3/uL — AB (ref 4.0–10.5)

## 2018-05-05 LAB — CBG MONITORING, ED
GLUCOSE-CAPILLARY: 213 mg/dL — AB (ref 65–99)
GLUCOSE-CAPILLARY: 153 mg/dL — AB (ref 65–99)
GLUCOSE-CAPILLARY: 128 mg/dL — AB (ref 65–99)

## 2018-05-05 LAB — TROPONIN I
TROPONIN I: 0.6 ng/mL — AB (ref <0.03)
TROPONIN I: 0.21 ng/mL — AB (ref <0.03)
Troponin I: 0.32 ng/mL (ref <0.03)

## 2018-05-05 LAB — HIV ANTIBODY (ROUTINE TESTING W REFLEX): HIV Screen 4th Generation wRfx: NONREACTIVE

## 2018-05-05 LAB — GLUCOSE, CAPILLARY: GLUCOSE-CAPILLARY: 216 mg/dL — AB (ref 65–99)

## 2018-05-05 MED ORDER — PRAVASTATIN SODIUM 40 MG PO TABS
40.0000 mg | ORAL_TABLET | Freq: Every day | ORAL | Status: DC
  Administered 2018-05-05 – 2018-05-06 (×2): 40 mg via ORAL
  Filled 2018-05-05 (×2): qty 1

## 2018-05-05 MED ORDER — SODIUM CHLORIDE 0.9% FLUSH
3.0000 mL | Freq: Two times a day (BID) | INTRAVENOUS | Status: DC
  Administered 2018-05-05 – 2018-05-06 (×4): 3 mL via INTRAVENOUS

## 2018-05-05 MED ORDER — ENSURE ENLIVE PO LIQD
237.0000 mL | Freq: Two times a day (BID) | ORAL | Status: DC
  Administered 2018-05-05 – 2018-05-06 (×3): 237 mL via ORAL

## 2018-05-05 MED ORDER — UMECLIDINIUM-VILANTEROL 62.5-25 MCG/INH IN AEPB
1.0000 | INHALATION_SPRAY | Freq: Every day | RESPIRATORY_TRACT | Status: DC
  Administered 2018-05-05 – 2018-05-07 (×3): 1 via RESPIRATORY_TRACT
  Filled 2018-05-05: qty 14

## 2018-05-05 MED ORDER — SODIUM CHLORIDE 0.9% FLUSH
3.0000 mL | Freq: Two times a day (BID) | INTRAVENOUS | Status: DC
  Administered 2018-05-05 – 2018-05-06 (×3): 3 mL via INTRAVENOUS

## 2018-05-05 MED ORDER — MIRTAZAPINE 15 MG PO TABS
15.0000 mg | ORAL_TABLET | Freq: Every evening | ORAL | Status: DC
  Administered 2018-05-05 – 2018-05-06 (×2): 15 mg via ORAL
  Filled 2018-05-05 (×3): qty 1

## 2018-05-05 MED ORDER — BISACODYL 5 MG PO TBEC: 5.0000 mg | DELAYED_RELEASE_TABLET | Freq: Every day | ORAL | Status: DC | PRN

## 2018-05-05 MED ORDER — INSULIN ASPART 100 UNIT/ML ~~LOC~~ SOLN
0.0000 [IU] | Freq: Three times a day (TID) | SUBCUTANEOUS | Status: DC
  Administered 2018-05-06: 1 [IU] via SUBCUTANEOUS
  Administered 2018-05-06: 3 [IU] via SUBCUTANEOUS

## 2018-05-05 MED ORDER — IPRATROPIUM-ALBUTEROL 0.5-2.5 (3) MG/3ML IN SOLN
3.0000 mL | Freq: Three times a day (TID) | RESPIRATORY_TRACT | Status: DC
  Administered 2018-05-06 – 2018-05-07 (×4): 3 mL via RESPIRATORY_TRACT
  Filled 2018-05-05 (×5): qty 3

## 2018-05-05 MED ORDER — SENNOSIDES-DOCUSATE SODIUM 8.6-50 MG PO TABS: 1.0000 | ORAL_TABLET | Freq: Every evening | ORAL | Status: DC | PRN

## 2018-05-05 MED ORDER — INSULIN ASPART 100 UNIT/ML ~~LOC~~ SOLN
0.0000 [IU] | SUBCUTANEOUS | Status: DC
  Administered 2018-05-05: 3 [IU] via SUBCUTANEOUS
  Administered 2018-05-05: 1 [IU] via SUBCUTANEOUS
  Administered 2018-05-05: 2 [IU] via SUBCUTANEOUS
  Administered 2018-05-05: 1 [IU] via SUBCUTANEOUS
  Filled 2018-05-05 (×3): qty 1

## 2018-05-05 MED ORDER — ENOXAPARIN SODIUM 40 MG/0.4ML ~~LOC~~ SOLN
40.0000 mg | SUBCUTANEOUS | Status: DC
  Administered 2018-05-05 – 2018-05-06 (×2): 40 mg via SUBCUTANEOUS
  Filled 2018-05-05 (×2): qty 0.4

## 2018-05-05 MED ORDER — ONDANSETRON HCL 4 MG PO TABS: 4.0000 mg | ORAL_TABLET | Freq: Four times a day (QID) | ORAL | Status: DC | PRN

## 2018-05-05 MED ORDER — SODIUM CHLORIDE 0.9 % IV SOLN: 250.0000 mL | INTRAVENOUS | Status: DC | PRN

## 2018-05-05 MED ORDER — FLUTICASONE PROPIONATE 50 MCG/ACT NA SUSP
1.0000 | Freq: Every day | NASAL | Status: DC
  Administered 2018-05-06: 1 via NASAL
  Filled 2018-05-05: qty 16

## 2018-05-05 MED ORDER — BENZONATATE 100 MG PO CAPS: 100.0000 mg | ORAL_CAPSULE | Freq: Three times a day (TID) | ORAL | Status: DC | PRN

## 2018-05-05 MED ORDER — SODIUM CHLORIDE 0.9% FLUSH
3.0000 mL | INTRAVENOUS | Status: DC | PRN
  Administered 2018-05-05: 3 mL via INTRAVENOUS

## 2018-05-05 MED ORDER — HYDROCODONE-ACETAMINOPHEN 5-325 MG PO TABS: 1.0000 | ORAL_TABLET | ORAL | Status: DC | PRN

## 2018-05-05 MED ORDER — LEVOTHYROXINE SODIUM 50 MCG PO TABS
62.5000 ug | ORAL_TABLET | Freq: Every day | ORAL | Status: DC
  Administered 2018-05-05 – 2018-05-07 (×3): 62.5 ug via ORAL
  Filled 2018-05-05: qty 1
  Filled 2018-05-05 (×2): qty 0.5
  Filled 2018-05-05: qty 1

## 2018-05-05 MED ORDER — ALBUTEROL SULFATE (2.5 MG/3ML) 0.083% IN NEBU
2.5000 mg | INHALATION_SOLUTION | RESPIRATORY_TRACT | Status: DC | PRN
  Administered 2018-05-05 (×2): 2.5 mg via RESPIRATORY_TRACT
  Filled 2018-05-05 (×2): qty 3

## 2018-05-05 MED ORDER — FLUTICASONE-UMECLIDIN-VILANT 100-62.5-25 MCG/INH IN AEPB: 1.0000 | INHALATION_SPRAY | Freq: Every day | RESPIRATORY_TRACT | Status: DC

## 2018-05-05 MED ORDER — PANTOPRAZOLE SODIUM 40 MG PO TBEC
40.0000 mg | DELAYED_RELEASE_TABLET | Freq: Every day | ORAL | Status: DC
  Administered 2018-05-05 – 2018-05-07 (×3): 40 mg via ORAL
  Filled 2018-05-05 (×3): qty 1

## 2018-05-05 MED ORDER — SODIUM CHLORIDE 0.9 % IV SOLN
1.0000 g | INTRAVENOUS | Status: DC
  Administered 2018-05-05: 1 g via INTRAVENOUS
  Filled 2018-05-05 (×2): qty 10

## 2018-05-05 MED ORDER — ACETAMINOPHEN 325 MG PO TABS: 650.0000 mg | ORAL_TABLET | Freq: Four times a day (QID) | ORAL | Status: DC | PRN

## 2018-05-05 MED ORDER — ONDANSETRON HCL 4 MG/2ML IJ SOLN: 4.0000 mg | Freq: Four times a day (QID) | INTRAMUSCULAR | Status: DC | PRN

## 2018-05-05 MED ORDER — ASPIRIN 81 MG PO CHEW
81.0000 mg | CHEWABLE_TABLET | Freq: Every day | ORAL | Status: DC
  Administered 2018-05-05 – 2018-05-07 (×3): 81 mg via ORAL
  Filled 2018-05-05 (×3): qty 1

## 2018-05-05 MED ORDER — TEMAZEPAM 15 MG PO CAPS
30.0000 mg | ORAL_CAPSULE | Freq: Every day | ORAL | Status: DC
  Administered 2018-05-05 – 2018-05-06 (×3): 30 mg via ORAL
  Filled 2018-05-05: qty 4
  Filled 2018-05-05: qty 2
  Filled 2018-05-05: qty 4

## 2018-05-05 MED ORDER — METHYLPREDNISOLONE SODIUM SUCC 125 MG IJ SOLR
60.0000 mg | Freq: Four times a day (QID) | INTRAMUSCULAR | Status: DC
  Administered 2018-05-05 – 2018-05-06 (×5): 60 mg via INTRAVENOUS
  Filled 2018-05-05 (×5): qty 2

## 2018-05-05 MED ORDER — IPRATROPIUM-ALBUTEROL 0.5-2.5 (3) MG/3ML IN SOLN
3.0000 mL | Freq: Four times a day (QID) | RESPIRATORY_TRACT | Status: DC
  Administered 2018-05-05 (×2): 3 mL via RESPIRATORY_TRACT
  Filled 2018-05-05 (×2): qty 3

## 2018-05-05 MED ORDER — ENOXAPARIN SODIUM 40 MG/0.4ML ~~LOC~~ SOLN: 40.0000 mg | SUBCUTANEOUS | Status: DC

## 2018-05-05 MED ORDER — ACETAMINOPHEN 650 MG RE SUPP: 650.0000 mg | Freq: Four times a day (QID) | RECTAL | Status: DC | PRN

## 2018-05-05 MED ORDER — SODIUM CHLORIDE 0.9 % IV SOLN
500.0000 mg | INTRAVENOUS | Status: DC
  Administered 2018-05-05 – 2018-05-06 (×2): 500 mg via INTRAVENOUS
  Filled 2018-05-05 (×2): qty 500

## 2018-05-05 NOTE — Progress Notes (Signed)
Patient seen and examined -admitted over this morning by Dr.Opyd, please see his excellent H&P for details, -Briefly Stephen [name] is a 62 year old male with history of COPD, prior history of lung cancer in 2006 and carcinoma of left vocal cord presented to the emergency room by EMS overnight, due to acute on chronic dyspnea, history of productive cough congestion wheezing and shortness of breath or almost 2 weeks which acutely worsened yesterday. -EKG in the ED was concerning for possible STEMI, cardiology reviewed EKG and canceled STEMI, chest x-ray was unremarkable, CT angiogram chest was negative for PE or any acute pulmonary process, he was started on BiPAP given nebs and steroids. Now improving -Denies any chest pain -continue IV Solu-Medrol, Rocephin/Zithromax, DuoNeb scheduled and when necessary -Trend troponin and follow-up 2-D echocardiogram for wall motion abnormality  Domenic Polite, MD

## 2018-05-05 NOTE — ED Notes (Signed)
Pt's CBG 153.  Informed Hope, RN.

## 2018-05-05 NOTE — H&P (Signed)
History and Physical    SIRAJ DERMODY TDD:[id number] DOB: 08-10-1956 DOA: 05/04/2018  PCP: [name], MD   Patient coming from: Home  Chief Complaint: SOB   HPI: Stephen [name] is a 62 y.o. male with medical history significant for lung cancer, carcinoma of the left vocal cord, and COPD, now presenting to the emergency department for evaluation of acute shortness of breath.  Patient reports that his chronic dyspnea began to worsen approximately 2 weeks ago, he has been using his inhalers with minimal improvement, and he then developed acute worsening today after working outdoors.  He denies any chest pain or palpitations and denies fevers or chills.  He called EMS, was found to be in acute distress with tripoding and diaphoresis on arrival, had a EKG that was concerning for possible STEMI, and was brought into the ED.  Cardiology reviewed the EKG and canceled the code STEMI.  Patient never did have any chest pain and does not have history of CAD.  ED Course: Upon arrival to the ED, patient is found to be afebrile, requiring BiPAP initially to maintain saturations in the low 90s, tachypneic to 30, tachycardic in the 130s, and with stable blood pressure.  EKG features a sinus tachycardia with RBBB and LAFB.  Chest x-ray is negative for acute cardiopulmonary disease.  Chemistry panel is notable for a potassium 3.4 and CBC features a leukocytosis to 23,500.  CTA chest is negative for PE or other acute intrathoracic pathology.  Patient had been treated with 324 mg of aspirin prior to arrival and was given steroids, magnesium, and racemic epinephrine in the ED.  He was successfully transitioned off of BiPAP.  He will be admitted for ongoing evaluation and management of acute COPD exacerbation.  Review of Systems:  All other systems reviewed and apart from HPI, are negative.  Past Medical History:  Diagnosis Date  . Allergic rhinitis, cause unspecified   . Anxiety   . Blindness of left eye     . Complication of anesthesia    difficulty awaking after colonoscopy   . Depression   . Dyspnea   . GERD (gastroesophageal reflux disease)   . Hypothyroidism   . Lung cancer ([place])    lung ca dx 06, has had chemo and radiation  . Osteoporosis   . Other emphysema ([place])    copd  . Throat cancer ([place])    throat ca dx 2007  . Thrombosed hemorrhoids   . Tubular adenoma of colon 2009  . Unspecified vitamin D deficiency     Past Surgical History:  Procedure Laterality Date  . COLONOSCOPY    . HERNIA REPAIR     as a baby- not sure where hernia was  . KNEE SURGERY Left 1972   .  has hardware  . LUNG LOBECTOMY     RUL  . MICROLARYNGOSCOPY WITH CO2 LASER AND EXCISION OF VOCAL CORD LESION N/A 12/19/2017   Procedure: MICROLARYNGOSCOPY WITH BIOPSY;  Surgeon: Melida Quitter, MD;  Location: [facility];  Service: ENT;  Laterality: N/A;  . ROTATOR CUFF REPAIR Left   . SHOULDER SURGERY Right    rotator cuff  . THROAT SURGERY     Laser surgery for throat cancer  . TRANSTHORACIC ECHOCARDIOGRAM  01/13/2012   EF=>55%; trace MR/TR;      reports that he quit smoking about 13 years ago. His smoking use included cigarettes. He has a 48.00 pack-year smoking history. He has never used smokeless tobacco. He reports that he drinks  alcohol. He reports that he does not use drugs.  Allergies  Allergen Reactions  . Codeine     Reaction ??  Marland Kitchen Cymbalta [Duloxetine Hcl] Nausea And Vomiting  . Montelukast Sodium Other (See Comments)    Causes sinusitis    Family History  Problem Relation Age of Onset  . Cancer Mother        Brain tumor  . COPD Mother   . Heart disease Father        CABG  . Prostate cancer Father   . Cystic fibrosis Sister        also heart failure  . Mental illness Brother   . Heart failure Paternal Grandmother   . Colon cancer Neg Hx   . Colon polyps Neg Hx   . Esophageal cancer Neg Hx      Prior to Admission medications   Medication Sig Start Date End Date Taking?  Authorizing Provider  albuterol (PROVENTIL HFA;VENTOLIN HFA) 108 (90 Base) MCG/ACT inhaler Inhale 1-2 puffs into the lungs every 6 (six) hours as needed for wheezing or shortness of breath.    [provider]  albuterol (PROVENTIL) (2.5 MG/3ML) 0.083% nebulizer solution Take 2.5 mg by nebulization 3 (three) times daily as needed for wheezing or shortness of breath.     [provider]  aspirin 81 MG chewable tablet Chew 81 mg by mouth daily.    [provider]  benzonatate (TESSALON) 100 MG capsule Take 100 mg by mouth every 8 (eight) hours as needed for cough. 03/19/18   [provider]  Fluticasone-Umeclidin-Vilant (TRELEGY ELLIPTA) 100-62.5-25 MCG/INH AEPB Inhale 1 puff into the lungs daily. 08/13/17   Brand Males, MD  levothyroxine (SYNTHROID, LEVOTHROID) 125 MCG tablet Take 62.5 mcg by mouth daily before breakfast.     [provider]  mirtazapine (REMERON) 15 MG tablet Take 15 mg by mouth every evening. 04/15/18   [provider]  nitroGLYCERIN (NITROSTAT) 0.4 MG SL tablet Place 1 tablet (0.4 mg total) under the tongue every 5 (five) minutes as needed for chest pain. 08/09/13   Delfina Redwood, MD  omeprazole (PRILOSEC) 40 MG capsule Take 1 capsule (40 mg total) by mouth daily. 04/08/18   Ladene Artist, MD  pravastatin (PRAVACHOL) 40 MG tablet Take 40 mg by mouth at bedtime.     [provider]  temazepam (RESTORIL) 15 MG capsule Take 30 mg by mouth at bedtime. 04/19/18   [provider]  temazepam (RESTORIL) 30 MG capsule Take 30 mg by mouth at bedtime.     [provider]  Vitamin D, Ergocalciferol, (DRISDOL) 50000 UNITS CAPS Take 50,000 Units by mouth every 14 (fourteen) days.  05/07/11   [provider]    Physical Exam: Vitals:   05/04/18 2315 05/04/18 2327 05/04/18 2330 05/05/18 0000  BP: 93/65  97/71 116/84  Pulse: 96  95 96  Resp: 20  20 (!) 25  Temp:      TempSrc:      SpO2: 96% 100%  99% 99%      Constitutional: Not in acute distress, calm  Eyes: PERTLA, lids and conjunctivae normal ENMT: Mucous membranes are moist. Posterior pharynx clear of any exudate or lesions.   Neck: normal, supple, no masses, no thyromegaly Respiratory: Breath sounds diminished bilaterally with expiratory wheezes. Accessory muscle recruitment noted. No pallor or cyanosis.   Cardiovascular: S1 & S2 heard, regular rate and rhythm. No extremity edema. No significant JVD. Abdomen: No distension, no tenderness, soft.  Bowel sounds normal.  Musculoskeletal: no clubbing / cyanosis. No joint deformity upper and lower extremities.   Skin: no significant rashes, lesions, ulcers. Warm, dry, well-perfused. Neurologic: CN 2-12 grossly intact. Sensation intact. Strength 5/5 in all 4 limbs.  Psychiatric: Alert and oriented x 3. Calm, cooperative.     Labs on Admission: I have personally reviewed following labs and imaging studies  CBC: Recent Labs  Lab 05/04/18 1940  WBC 23.5*  HGB 14.3  HCT 43.7  MCV 84.4  PLT 626   Basic Metabolic Panel: Recent Labs  Lab 05/04/18 1940  NA 137  K 3.4*  CL 96*  CO2 28  GLUCOSE 201*  BUN 12  CREATININE 0.85  CALCIUM 8.8*   GFR: CrCl cannot be calculated (Unknown ideal weight.). Liver Function Tests: No results for input(s): AST, ALT, ALKPHOS, BILITOT, PROT, ALBUMIN in the last 168 hours. No results for input(s): LIPASE, AMYLASE in the last 168 hours. No results for input(s): AMMONIA in the last 168 hours. Coagulation Profile: No results for input(s): INR, PROTIME in the last 168 hours. Cardiac Enzymes: No results for input(s): CKTOTAL, CKMB, CKMBINDEX, TROPONINI in the last 168 hours. BNP (last 3 results) No results for input(s): PROBNP in the last 8760 hours. HbA1C: No results for input(s): HGBA1C in the last 72 hours. CBG: No results for input(s): GLUCAP in the last 168 hours. Lipid Profile: No results for input(s): CHOL, HDL, LDLCALC, TRIG,  CHOLHDL, LDLDIRECT in the last 72 hours. Thyroid Function Tests: No results for input(s): TSH, T4TOTAL, FREET4, T3FREE, THYROIDAB in the last 72 hours. Anemia Panel: No results for input(s): VITAMINB12, FOLATE, FERRITIN, TIBC, IRON, RETICCTPCT in the last 72 hours. Urine analysis:    Component Value Date/Time   COLORURINE AMBER (A) 03/20/2016 1923   APPEARANCEUR CLEAR 03/20/2016 1923   LABSPEC 1.028 03/20/2016 1923   PHURINE 5.5 03/20/2016 1923   GLUCOSEU NEGATIVE 03/20/2016 1923   HGBUR MODERATE (A) 03/20/2016 1923   BILIRUBINUR SMALL (A) 03/20/2016 1923   KETONESUR 15 (A) 03/20/2016 1923   PROTEINUR 100 (A) 03/20/2016 1923   NITRITE NEGATIVE 03/20/2016 1923   LEUKOCYTESUR NEGATIVE 03/20/2016 1923   Sepsis Labs: @LABRCNTIP (procalcitonin:4,lacticidven:4) )No results found for this or any previous visit (from the past 240 hour(s)).   Radiological Exams on Admission: Ct Angio Chest Pe W And/or Wo Contrast  Result Date: 05/04/2018 CLINICAL DATA:  62 year old male with sudden onset shortness of breath. Concern for pulmonary embolism. History of right-sided lung cancer treated with right upper lobectomy and chemo and radiation therapy. EXAM: CT ANGIOGRAPHY CHEST WITH CONTRAST TECHNIQUE: Multidetector CT imaging of the chest was performed using the standard protocol during bolus administration of intravenous contrast. Multiplanar CT image reconstructions and MIPs were obtained to evaluate the vascular anatomy. CONTRAST:  ISOVUE-370 IOPAMIDOL (ISOVUE-370) INJECTION 76% COMPARISON:  Chest radiograph dated 05/04/2018 and CT dated 06/09/2017 FINDINGS: Cardiovascular: There is no cardiomegaly. There is a small pericardial effusion which has increased in size compared to the prior CT and measures 2 cm in thickness anterior to the heart. Echocardiogram may provide better evaluation. The thoracic aorta is unremarkable. There is no CT evidence of pulmonary embolism. Mediastinum/Nodes: There is no hilar or  mediastinal adenopathy. There is a small hiatal hernia. The esophagus is grossly unremarkable. No mediastinal fluid collection. Lungs/Pleura: Postsurgical changes of right upper lobectomy with compensatory hyperexpansion of the right middle and lower lobe. Right apical masslike density appears similar to prior CT and most consistent with postsurgical changes. There is associated traction and  mild shift of the mediastinum into the right hemithorax similar to prior CT. There is a background of emphysema. No new consolidative changes or suspicious lesions. There is no pleural effusion or pneumothorax. The central airways are patent. Upper Abdomen: No acute abnormality. Musculoskeletal: Postsurgical changes of right chest wall thoracotomy. No acute osseous pathology. No suspicious bone lesions. Review of the MIP images confirms the above findings. IMPRESSION: 1. No acute intrathoracic pathology. No CT evidence of pulmonary embolism. 2. Postsurgical changes of right upper lobectomy as seen on the prior CT. No new masses or evidence of recurrent disease. 3. COPD. 4. Small pericardial effusion, increased compared to the prior chest CT. Electronically Signed   By: Anner Crete M.D.   On: 05/04/2018 23:27   Dg Chest Portable 1 View  Result Date: 05/04/2018 CLINICAL DATA:  Short of breath EXAM: PORTABLE CHEST 1 VIEW COMPARISON:  03/20/2016 FINDINGS: Pleural thickening at the right apex with fibrotic and postoperative changes and volume loss in the right lung are stable. No pneumothorax. Left lung is hyperaerated and clear. Normal heart size. IMPRESSION: Chronic changes in the right hemithorax. No active cardiopulmonary disease. Electronically Signed   By: Marybelle Killings M.D.   On: 05/04/2018 20:14    EKG: Independently reviewed. Sinus tachycardia, RBBB, LAFB.   Assessment/Plan   1. COPD with acute exacerbation; acute hypoxic respiratory failure   - Presents with acute SOB after ~2 wks of insidiously worsening  dyspnea and wheezing  - Was in acute distress initially, treated by EMS with nebs, 125 mg IV Solu-Medrol, and CPAP  - Given additional steroid and 2 g magnesium in ED  - CTA negative for PE or pneumonia  - Check sputum culture, continue systemic steroid, continue Trelegy, prn nebs, supplemental O2    2. Elevated troponin  - Code STEMI called by EMS, EKG reviewed by cardiology and not consistent with STEMI; patient has not had any chest pain and does not have hx of CAD  - Initial troponin elevated to 0.12, likely reflecting demand ischemia in setting of acute resp distress  - Treated with ASA 324 mg prior to arrival  - Continue cardiac monitoring, trend troponin, check echocardiogram   3. Lung cancer; cancer of glottis  - Status-post lobectomy and chemoradiation for lung cancer; also s/p laryngectomy  - Continue outpatient follow-up   4. Hypothyroidism  - Continue Synthroid    DVT prophylaxis: Lovenox Code Status: Full  Family Communication: Discussed with patient Consults called: Cardiology consulted by ED physician  Admission status: Inpatient    Vianne Bulls, MD [facility] Pager [phone]  If 7PM-7AM, please contact night-coverage [email or web address] Password [email or web address]  05/05/2018, 2:00 AM

## 2018-05-06 ENCOUNTER — Other Ambulatory Visit (HOSPITAL_COMMUNITY): Payer: 59

## 2018-05-06 ENCOUNTER — Inpatient Hospital Stay (HOSPITAL_COMMUNITY): Payer: Medicare Other

## 2018-05-06 DIAGNOSIS — I5030 Unspecified diastolic (congestive) heart failure: Secondary | ICD-10-CM

## 2018-05-06 LAB — GLUCOSE, CAPILLARY
GLUCOSE-CAPILLARY: 134 mg/dL — AB (ref 65–99)
GLUCOSE-CAPILLARY: 230 mg/dL — AB (ref 65–99)
Glucose-Capillary: 134 mg/dL — ABNORMAL HIGH (ref 65–99)
Glucose-Capillary: 107 mg/dL — ABNORMAL HIGH (ref 65–99)
Glucose-Capillary: 181 mg/dL — ABNORMAL HIGH (ref 65–99)

## 2018-05-06 LAB — BASIC METABOLIC PANEL
ANION GAP: 7 (ref 5–15)
BUN: 16 mg/dL (ref 6–20)
CHLORIDE: 104 mmol/L (ref 101–111)
CO2: 31 mmol/L (ref 22–32)
CREATININE: 0.65 mg/dL (ref 0.61–1.24)
Calcium: 8.8 mg/dL — ABNORMAL LOW (ref 8.9–10.3)
GFR calc non Af Amer: >60 mL/min (ref >60)
Glucose, Bld: 130 mg/dL — ABNORMAL HIGH (ref 65–99)
Potassium: 4.9 mmol/L (ref 3.5–5.1)
SODIUM: 142 mmol/L (ref 135–145)

## 2018-05-06 LAB — ECHOCARDIOGRAM COMPLETE: Weight: 2659.2 oz

## 2018-05-06 LAB — CBC
HCT: 43.8 % (ref 39.0–52.0)
HEMOGLOBIN: 13.8 g/dL (ref 13.0–17.0)
MCH: 27.2 pg (ref 26.0–34.0)
MCHC: 31.5 g/dL (ref 30.0–36.0)
MCV: 86.2 fL (ref 78.0–100.0)
PLATELETS: 292 10*3/uL (ref 150–400)
RBC: 5.08 MIL/uL (ref 4.22–5.81)
RDW: 13.5 % (ref 11.5–15.5)
WBC: 25.3 10*3/uL — AB (ref 4.0–10.5)

## 2018-05-06 MED ORDER — METHYLPREDNISOLONE SODIUM SUCC 40 MG IJ SOLR
40.0000 mg | Freq: Three times a day (TID) | INTRAMUSCULAR | Status: DC
  Administered 2018-05-06 – 2018-05-07 (×3): 40 mg via INTRAVENOUS
  Filled 2018-05-06 (×3): qty 1

## 2018-05-06 NOTE — Consult Note (Signed)
Cardiology Consultation:   Patient ID: Stephen [name]; [id number]; Nov 30, 1956   Admit date: 05/04/2018 Date of Consult: 05/06/2018  Primary Care Provider: [name], MD Primary Cardiologist:  New , [place]  Primary Electrophysiologist:     Patient Profile:   Stephen [name] is a 62 y.o. male with a hx of COPD, lung cancer, carcinoma of the left vocal cord who is being seen today for the evaluation of shortness of breath at the request of  Dr. Sloan Leiter. Marland Kitchen  History of Present Illness:   Stephen [name] is a 24 year old gentleman with history of lung cancer and carcinoma of the left vocal cord.  He has significant COPD.  He started having worsening shortness of breath approximately 2 weeks ago.  He started using his inhalers with minimal improvement.  He was admitted to the hospital on May 7 with acute shortness of breath.  He had an elevated white blood cell count at 23,000. Chest CT was negative for pulmonary embolus.  He was initially started on BiPAP which was gradually weaned off.  Troponin levels were found to be minimally elevated with peak troponin of 0.6.  It is trending downward.  Echocardiogram reveals normal left ventricular systolic function with an EF of 55 to 60%.  He has basal to mid inferior hypokinesis.  There is grade 1 diastolic dysfunction.  + wt loss  poor appetite,  Has chronic dyspnea from COPD.  He denies any chest pain or chest squeezing. Is not able to do much work because of his severe shortness of breath.  He denies any blood in stool or blood in his urine.  Previous stress Myoview study in August, 2014 revealed no evidence of ischemia. Echocardiogram in August, 2014 revealed normal left ventricular systolic function with ejection fraction of 55 to 60%.  Past Medical History:  Diagnosis Date  . Allergic rhinitis, cause unspecified   . Anxiety   . Blindness of left eye   . Complication of anesthesia    difficulty awaking after colonoscopy   .  Depression   . Dyspnea   . GERD (gastroesophageal reflux disease)   . Hypothyroidism   . Lung cancer ([place])    lung ca dx 06, has had chemo and radiation  . Osteoporosis   . Other emphysema ([facility])    copd  . Throat cancer ([place])    throat ca dx 2007  . Thrombosed hemorrhoids   . Tubular adenoma of colon 2009  . Unspecified vitamin D deficiency     Past Surgical History:  Procedure Laterality Date  . COLONOSCOPY    . HERNIA REPAIR     as a baby- not sure where hernia was  . KNEE SURGERY Left 1972   .  has hardware  . LUNG LOBECTOMY     RUL  . MICROLARYNGOSCOPY WITH CO2 LASER AND EXCISION OF VOCAL CORD LESION N/A 12/19/2017   Procedure: MICROLARYNGOSCOPY WITH BIOPSY;  Surgeon: Melida Quitter, MD;  Location: [facility];  Service: ENT;  Laterality: N/A;  . ROTATOR CUFF REPAIR Left   . SHOULDER SURGERY Right    rotator cuff  . THROAT SURGERY     Laser surgery for throat cancer  . TRANSTHORACIC ECHOCARDIOGRAM  01/13/2012   EF=>55%; trace MR/TR;      Home Medications:  Prior to Admission medications   Medication Sig Start Date End Date Taking? Authorizing Provider  albuterol (PROVENTIL HFA;VENTOLIN HFA) 108 (90 Base) MCG/ACT inhaler Inhale 1-2 puffs into the lungs every 6 (six) hours as needed  for wheezing or shortness of breath.   Yes [provider]  albuterol (PROVENTIL) (2.5 MG/3ML) 0.083% nebulizer solution Take 2.5 mg by nebulization 3 (three) times daily as needed for wheezing or shortness of breath.    Yes [provider]  aspirin 81 MG chewable tablet Chew 81 mg by mouth daily.   Yes [provider]  benzonatate (TESSALON) 100 MG capsule Take 100 mg by mouth every 8 (eight) hours as needed for cough. 03/19/18  Yes [provider]  Fluticasone-Umeclidin-Vilant (TRELEGY ELLIPTA) 100-62.5-25 MCG/INH AEPB Inhale 1 puff into the lungs daily. 08/13/17  Yes Brand Males, MD  levothyroxine (SYNTHROID, LEVOTHROID) 125 MCG tablet Take 62.5 mcg by mouth  daily before breakfast.    Yes [provider]  mirtazapine (REMERON) 15 MG tablet Take 15 mg by mouth every evening. 04/15/18  Yes [provider]  nitroGLYCERIN (NITROSTAT) 0.4 MG SL tablet Place 1 tablet (0.4 mg total) under the tongue every 5 (five) minutes as needed for chest pain. 08/09/13  Yes Delfina Redwood, MD  omeprazole (PRILOSEC) 40 MG capsule Take 1 capsule (40 mg total) by mouth daily. 04/08/18  Yes Ladene Artist, MD  pravastatin (PRAVACHOL) 40 MG tablet Take 40 mg by mouth at bedtime.    Yes [provider]  temazepam (RESTORIL) 30 MG capsule Take 30 mg by mouth at bedtime.    Yes [provider]  Vitamin D, Ergocalciferol, (DRISDOL) 50000 UNITS CAPS Take 50,000 Units by mouth every 14 (fourteen) days.  05/07/11  Yes [provider]    Inpatient Medications: Scheduled Meds: . aspirin  81 mg Oral Daily  . enoxaparin (LOVENOX) injection  40 mg Subcutaneous Q24H  . feeding supplement (ENSURE ENLIVE)  237 mL Oral BID BM  . fluticasone  1 spray Each Nare Daily  . insulin aspart  0-9 Units Subcutaneous TID WC  . ipratropium-albuterol  3 mL Nebulization TID  . levothyroxine  62.5 mcg Oral QAC breakfast  . methylPREDNISolone (SOLU-MEDROL) injection  40 mg Intravenous Q8H  . mirtazapine  15 mg Oral QPM  . pantoprazole  40 mg Oral Daily  . pravastatin  40 mg Oral q1800  . sodium chloride flush  3 mL Intravenous Q12H  . sodium chloride flush  3 mL Intravenous Q12H  . temazepam  30 mg Oral QHS  . umeclidinium-vilanterol  1 puff Inhalation Daily   Continuous Infusions: . sodium chloride     PRN Meds: sodium chloride, acetaminophen **OR** acetaminophen, albuterol, benzonatate, bisacodyl, HYDROcodone-acetaminophen, ondansetron **OR** ondansetron (ZOFRAN) IV, senna-docusate, sodium chloride flush  Allergies:    Allergies  Allergen Reactions  . Codeine     Reaction ??  Marland Kitchen Cymbalta [Duloxetine Hcl] Nausea And Vomiting  . Montelukast  Sodium Other (See Comments)    Causes sinusitis    Social History:   Social History   Socioeconomic History  . Marital status: Single    Spouse name: Not on file  . Number of children: 0  . Years of education: Not on file  . Highest education level: Not on file  Occupational History  . Occupation: Disabled    Fish farm manager: UNEMPLOYED  Social Needs  . Financial resource strain: Not on file  . Food insecurity:    Worry: Not on file    Inability: Not on file  . Transportation needs:    Medical: Not on file    Non-medical: Not on file  Tobacco Use  . Smoking status: Former Smoker    Packs/day: 1.50  Years: 32.00    Pack years: 48.00    Types: Cigarettes    Last attempt to quit: 12/30/2004    Years since quitting: 13.3  . Smokeless tobacco: Never Used  . Tobacco comment: 1 ppd x 30 years  Substance and Sexual Activity  . Alcohol use: Yes    Comment: 1- 2 beers monthly  . Drug use: No  . Sexual activity: Not on file  Lifestyle  . Physical activity:    Days per week: Not on file    Minutes per session: Not on file  . Stress: Not on file  Relationships  . Social connections:    Talks on phone: Not on file    Gets together: Not on file    Attends religious service: Not on file    Active member of club or organization: Not on file    Attends meetings of clubs or organizations: Not on file    Relationship status: Not on file  . Intimate partner violence:    Fear of current or ex partner: Not on file    Emotionally abused: Not on file    Physically abused: Not on file    Forced sexual activity: Not on file  Other Topics Concern  . Not on file  Social History Narrative  . Not on file    Family History:    Family History  Problem Relation Age of Onset  . Cancer Mother        Brain tumor  . COPD Mother   . Heart disease Father        CABG  . Prostate cancer Father   . Cystic fibrosis Sister        also heart failure  . Mental illness Brother   . Heart failure  Paternal Grandmother   . Colon cancer Neg Hx   . Colon polyps Neg Hx   . Esophageal cancer Neg Hx      ROS:  Please see the history of present illness.   All other ROS reviewed and negative.     Physical Exam/Data:   Vitals:   05/06/18 0507 05/06/18 0736 05/06/18 1141 05/06/18 1535  BP: 132/87  111/81   Pulse: 94  (!) 106   Resp: (!) 24  20   Temp: 97.7 F (36.5 C)  97.9 F (36.6 C)   TempSrc: Oral  Oral   SpO2: 97% 96% 97% 96%  Weight:        Intake/Output Summary (Last 24 hours) at 05/06/2018 1611 Last data filed at 05/05/2018 2000 Gross per 24 hour  Intake 120 ml  Output -  Net 120 ml   Filed Weights   05/05/18 1602  Weight: 166 lb 3.2 oz (75.4 kg)   Body mass index is 23.85 kg/m.  General: Thin, middle-aged gentleman.  Appears to be chronically ill.  Mild respiratory distress. HEENT: normal Lymph: no adenopathy Neck: no JVD Endocrine:  No thryomegaly Vascular: No carotid bruits; FA pulses 2+ bilaterally without bruits  Cardiac:  RR  Lungs:  Few wheezes, rhonchi  Abd: soft, nontender, no hepatomegaly  Ext: no edema Musculoskeletal:  No deformities, BUE and BLE strength normal and equal Skin: warm and dry  Neuro:  CNs 2-12 intact, no focal abnormalities noted Psych:  Normal affect   EKG:  The EKG was personally reviewed and demonstrates:  Sinus tach,  RBBB  Telemetry:  Telemetry was personally reviewed and demonstrates:  NSR   Relevant CV Studies:   Laboratory Data:  Chemistry  Recent Labs  Lab 05/04/18 1940 05/05/18 0215 05/06/18 0629  NA 137 136 142  K 3.4* 3.5 4.9  CL 96* 97* 104  CO2 28 27 31   GLUCOSE 201* 219* 130*  BUN 12 14 16   CREATININE 0.85 0.92 0.65  CALCIUM 8.8* 8.6* 8.8*  GFRNONAA >60 >60 >60  GFRAA >60 >60 >60  ANIONGAP 13 12 7     No results for input(s): PROT, ALBUMIN, AST, ALT, ALKPHOS, BILITOT in the last 168 hours. Hematology Recent Labs  Lab 05/04/18 1940 05/05/18 0215 05/06/18 0629  WBC 23.5* 11.9* 25.3*  RBC  5.18 4.81 5.08  HGB 14.3 13.0 13.8  HCT 43.7 40.5 43.8  MCV 84.4 84.2 86.2  MCH 27.6 27.0 27.2  MCHC 32.7 32.1 31.5  RDW 13.2 13.3 13.5  PLT 326 259 292   Cardiac Enzymes Recent Labs  Lab 05/05/18 0215 05/05/18 0757 05/05/18 1600  TROPONINI 0.60* 0.32* 0.21*    Recent Labs  Lab 05/04/18 1942  TROPIPOC 0.12*    BNPNo results for input(s): BNP, PROBNP in the last 168 hours.  DDimer No results for input(s): DDIMER in the last 168 hours.  Radiology/Studies:  Ct Angio Chest Pe W And/or Wo Contrast  Result Date: 05/04/2018 CLINICAL DATA:  62 year old male with sudden onset shortness of breath. Concern for pulmonary embolism. History of right-sided lung cancer treated with right upper lobectomy and chemo and radiation therapy. EXAM: CT ANGIOGRAPHY CHEST WITH CONTRAST TECHNIQUE: Multidetector CT imaging of the chest was performed using the standard protocol during bolus administration of intravenous contrast. Multiplanar CT image reconstructions and MIPs were obtained to evaluate the vascular anatomy. CONTRAST:  ISOVUE-370 IOPAMIDOL (ISOVUE-370) INJECTION 76% COMPARISON:  Chest radiograph dated 05/04/2018 and CT dated 06/09/2017 FINDINGS: Cardiovascular: There is no cardiomegaly. There is a small pericardial effusion which has increased in size compared to the prior CT and measures 2 cm in thickness anterior to the heart. Echocardiogram may provide better evaluation. The thoracic aorta is unremarkable. There is no CT evidence of pulmonary embolism. Mediastinum/Nodes: There is no hilar or mediastinal adenopathy. There is a small hiatal hernia. The esophagus is grossly unremarkable. No mediastinal fluid collection. Lungs/Pleura: Postsurgical changes of right upper lobectomy with compensatory hyperexpansion of the right middle and lower lobe. Right apical masslike density appears similar to prior CT and most consistent with postsurgical changes. There is associated traction and mild shift of the  mediastinum into the right hemithorax similar to prior CT. There is a background of emphysema. No new consolidative changes or suspicious lesions. There is no pleural effusion or pneumothorax. The central airways are patent. Upper Abdomen: No acute abnormality. Musculoskeletal: Postsurgical changes of right chest wall thoracotomy. No acute osseous pathology. No suspicious bone lesions. Review of the MIP images confirms the above findings. IMPRESSION: 1. No acute intrathoracic pathology. No CT evidence of pulmonary embolism. 2. Postsurgical changes of right upper lobectomy as seen on the prior CT. No new masses or evidence of recurrent disease. 3. COPD. 4. Small pericardial effusion, increased compared to the prior chest CT. Electronically Signed   By: Anner Crete M.D.   On: 05/04/2018 23:27   Dg Chest Portable 1 View  Result Date: 05/04/2018 CLINICAL DATA:  Short of breath EXAM: PORTABLE CHEST 1 VIEW COMPARISON:  03/20/2016 FINDINGS: Pleural thickening at the right apex with fibrotic and postoperative changes and volume loss in the right lung are stable. No pneumothorax. Left lung is hyperaerated and clear. Normal heart size. IMPRESSION: Chronic changes in the right hemithorax.  No active cardiopulmonary disease. Electronically Signed   By: Marybelle Killings M.D.   On: 05/04/2018 20:14    Assessment and Plan:   1. Shortness of breath with exertion: He is having progressive shortness of breath with minimal exertion.  This is been going on for the past several weeks.  He now presents with respiratory distress and positive troponin levels.  It is quite possible that these troponin elevations are due to demand ischemia.  Echocardiogram revealed an inferior wall defect.   Presenting EKG showed sinus tachycardia with a right bundle branch block.  The right bundle branch block is new.  Because of these abnormalities I would like to proceed with [place] study. We discussed the possibility that he might  need a heart catheterization   if the Myoview is abnormal.    For questions or updates, please contact [place] Please consult [email or web address] for contact info under Cardiology/STEMI.   Signed, Mertie Moores, MD  05/06/2018 4:11 PM

## 2018-05-06 NOTE — Progress Notes (Signed)
  Echocardiogram 2D Echocardiogram has been performed.  Darlina Sicilian M 05/06/2018, 8:33 AM

## 2018-05-06 NOTE — Progress Notes (Addendum)
PROGRESS NOTE        PATIENT DETAILS Name: Stephen [name] Age: 62 y.o. Sex: male Date of Birth: 03/12/1956 Admit Date: 05/04/2018 Admitting Physician Vianne Bulls, MD QVZ:DGLOVF, [name], MD  Brief Narrative: Patient is a 62 y.o. 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  See below for further details.  Subjective: Feels much better and wants to go home.  Assessment/Plan: Acute on chronic hypoxic respiratory failure secondary to COPD exacerbation: Clinically improved-no longer on BiPAP-stable on oxygen via nasal cannula.  Claims that he is on home O2 as needed.  Moving air well-hardly any rhonchi-decrease steroids, continue bronchodilators.  CT angiogram of the chest did not show any pneumonia-do not think patient requires antimicrobial therapy at this time-we will  stop Rocephin and Zithromax.  Elevated troponins: Likely secondary demand ischemia-however echocardiogram shows basal and mid inferior wall hypokinesis that are new when compared to his prior echo.  EF is preserved.  We will  get a cardiology opinion before consideration of discharge.  Leukocytosis: Likely secondary to steroid use-clinically improved-no indication of any other infection at this time.  Plans to stop all antimicrobial therapy.  Hypothyroidism: Continue Synthroid  Dyslipidemia: Continue statin  GERD: PPI  History of laryngeal/lung cancer: Stable for outpatient follow-up with his oncologist.  Telemetry (independently reviewed): Sinus rhythm  DVT  Prophylaxis: Prophylactic Lovenox   Code Status: Full code   Family Communication: None at bedside  Disposition Plan: Remain inpatient-Home soon-await cardiology   Antimicrobial agents: Anti-infectives (From admission, onward)   Start     Dose/Rate Route Frequency Ordered Stop   05/05/18 1130  cefTRIAXone (ROCEPHIN) 1 g in sodium chloride 0.9 % 100 mL IVPB     1 g 200 mL/hr over 30 Minutes Intravenous Every 24 hours 05/05/18 1051     05/05/18 0200  azithromycin (ZITHROMAX) 500 mg in sodium chloride 0.9 % 250 mL IVPB     500 mg 250 mL/hr over 60 Minutes Intravenous Every 24 hours 05/05/18 0159        Procedures: None  CONSULTS:  cardiology  Time spent: 25 minutes-Greater than 50% of this time was spent in counseling, explanation of diagnosis, planning of further management, and coordination of care.  MEDICATIONS: Scheduled Meds: . aspirin  81 mg Oral Daily  . enoxaparin (LOVENOX) injection  40 mg Subcutaneous Q24H  . feeding supplement (ENSURE ENLIVE)  237 mL Oral BID BM  . fluticasone  1 spray Each Nare Daily  . insulin aspart  0-9 Units Subcutaneous TID WC  . ipratropium-albuterol  3 mL Nebulization TID  . levothyroxine  62.5 mcg Oral QAC breakfast  . methylPREDNISolone (SOLU-MEDROL) injection  40 mg Intravenous Q8H  . mirtazapine  15 mg Oral QPM  . pantoprazole  40 mg Oral Daily  . pravastatin  40 mg Oral q1800  . sodium chloride flush  3 mL Intravenous Q12H  . sodium chloride flush  3 mL Intravenous Q12H  . temazepam  30 mg Oral QHS  . umeclidinium-vilanterol  1 puff Inhalation Daily  Continuous Infusions: . sodium chloride    . azithromycin Stopped (05/06/18 0359)  . cefTRIAXone (ROCEPHIN)  IV Stopped (05/05/18 1303)   PRN Meds:.sodium chloride, acetaminophen **OR** acetaminophen, albuterol, benzonatate, bisacodyl, HYDROcodone-acetaminophen, ondansetron **OR** ondansetron (ZOFRAN) IV, senna-docusate, sodium chloride flush   PHYSICAL EXAM: Vital  signs: Vitals:   05/05/18 2114 05/05/18 2210 05/06/18 0507 05/06/18 0736  BP:  114/85 132/87   Pulse: (!) 102 98 94   Resp: 16 (!) 22 (!) 24   Temp:  97.7 F (36.5 C) 97.7 F (36.5 C)   TempSrc:  Oral Oral   SpO2: 95% 94% 97% 96%  Weight:       Filed Weights   05/05/18 1602  Weight: 75.4 kg (166 lb 3.2 oz)   Body mass index is 23.85 kg/m.   General appearance :Awake, alert, not in any distress. Eyes:, pupils equally reactive to light and accomodation,no scleral icterus HEENT: Atraumatic and Normocephalic Neck: supple, no JVD. No cervical lymphadenopathy.  Resp:Good air entry bilaterally, only a few scattered rhonchi CVS: S1 S2 regular, no murmurs.  GI: Bowel sounds present, Non tender and not distended with no gaurding, rigidity or rebound.No organomegaly Extremities: B/L Lower Ext shows no edema, both legs are warm to touch Neurology:  speech clear,Non focal, sensation is grossly intact. Psychiatric: Normal judgment and insight. Alert and oriented x 3. Normal mood. Musculoskeletal:No digital cyanosis Skin:No Rash, warm and dry Wounds:N/A  I have personally reviewed following labs and imaging studies  LABORATORY DATA: CBC: Recent Labs  Lab 05/04/18 1940 05/05/18 0215 05/06/18 0629  WBC 23.5* 11.9* 25.3*  NEUTROABS  --  11.5*  --   HGB 14.3 13.0 13.8  HCT 43.7 40.5 43.8  MCV 84.4 84.2 86.2  PLT 326 259 476    Basic Metabolic Panel: Recent Labs  Lab 05/04/18 1940 05/05/18 0215 05/06/18 0629  NA 137 136 142  K 3.4* 3.5 4.9  CL 96* 97* 104  CO2 28 27 31   GLUCOSE 201* 219* 130*  BUN 12 14 16   CREATININE 0.85 0.92 0.65  CALCIUM 8.8* 8.6* 8.8*    GFR: Estimated Creatinine Clearance: 100.1 mL/min (by C-G formula based on SCr of 0.65 mg/dL).  Liver Function Tests: No results for input(s): AST, ALT, ALKPHOS, BILITOT, PROT, ALBUMIN in the last 168 hours. No results for input(s): LIPASE, AMYLASE in the last 168 hours. No results for input(s): AMMONIA in  the last 168 hours.  Coagulation Profile: No results for input(s): INR, PROTIME in the last 168 hours.  Cardiac Enzymes: Recent Labs  Lab 05/05/18 0215 05/05/18 0757 05/05/18 1600  TROPONINI 0.60* 0.32* 0.21*    BNP (last 3 results) No results for input(s): PROBNP in the last 8760 hours.  HbA1C: No results for input(s): HGBA1C in the last 72 hours.  CBG: Recent Labs  Lab 05/05/18 1148 05/05/18 1627 05/05/18 2059 05/06/18 0613 05/06/18 1057  GLUCAP 128* 134* 216* 134* 230*    Lipid Profile: No results for input(s): CHOL, HDL, LDLCALC, TRIG, CHOLHDL, LDLDIRECT in the last 72 hours.  Thyroid Function Tests: No results for input(s): TSH, T4TOTAL, FREET4, T3FREE, THYROIDAB in the last 72 hours.  Anemia Panel: No results for input(s): VITAMINB12, FOLATE, FERRITIN, TIBC, IRON, RETICCTPCT in the last 72 hours.  Urine analysis:    Component Value Date/Time   COLORURINE AMBER (A) 03/20/2016 1923   APPEARANCEUR CLEAR 03/20/2016 1923   LABSPEC 1.028 03/20/2016 1923   PHURINE 5.5 03/20/2016 1923   GLUCOSEU NEGATIVE 03/20/2016 1923   HGBUR MODERATE (  A) 03/20/2016 1923   BILIRUBINUR SMALL (A) 03/20/2016 1923   KETONESUR 15 (A) 03/20/2016 1923   PROTEINUR 100 (A) 03/20/2016 1923   NITRITE NEGATIVE 03/20/2016 1923   LEUKOCYTESUR NEGATIVE 03/20/2016 1923    Sepsis Labs: Lactic Acid, Venous    Component Value Date/Time   LATICACIDVEN 1.05 03/20/2016 2034    MICROBIOLOGY: No results found for this or any previous visit (from the past 240 hour(s)).  RADIOLOGY STUDIES/RESULTS: Ct Angio Chest Pe W And/or Wo Contrast  Result Date: 05/04/2018 CLINICAL DATA:  62 year old male with sudden onset shortness of breath. Concern for pulmonary embolism. History of right-sided lung cancer treated with right upper lobectomy and chemo and radiation therapy. EXAM: CT ANGIOGRAPHY CHEST WITH CONTRAST TECHNIQUE: Multidetector CT imaging of the chest was performed using the standard  protocol during bolus administration of intravenous contrast. Multiplanar CT image reconstructions and MIPs were obtained to evaluate the vascular anatomy. CONTRAST:  ISOVUE-370 IOPAMIDOL (ISOVUE-370) INJECTION 76% COMPARISON:  Chest radiograph dated 05/04/2018 and CT dated 06/09/2017 FINDINGS: Cardiovascular: There is no cardiomegaly. There is a small pericardial effusion which has increased in size compared to the prior CT and measures 2 cm in thickness anterior to the heart. Echocardiogram may provide better evaluation. The thoracic aorta is unremarkable. There is no CT evidence of pulmonary embolism. Mediastinum/Nodes: There is no hilar or mediastinal adenopathy. There is a small hiatal hernia. The esophagus is grossly unremarkable. No mediastinal fluid collection. Lungs/Pleura: Postsurgical changes of right upper lobectomy with compensatory hyperexpansion of the right middle and lower lobe. Right apical masslike density appears similar to prior CT and most consistent with postsurgical changes. There is associated traction and mild shift of the mediastinum into the right hemithorax similar to prior CT. There is a background of emphysema. No new consolidative changes or suspicious lesions. There is no pleural effusion or pneumothorax. The central airways are patent. Upper Abdomen: No acute abnormality. Musculoskeletal: Postsurgical changes of right chest wall thoracotomy. No acute osseous pathology. No suspicious bone lesions. Review of the MIP images confirms the above findings. IMPRESSION: 1. No acute intrathoracic pathology. No CT evidence of pulmonary embolism. 2. Postsurgical changes of right upper lobectomy as seen on the prior CT. No new masses or evidence of recurrent disease. 3. COPD. 4. Small pericardial effusion, increased compared to the prior chest CT. Electronically Signed   By: Anner Crete M.D.   On: 05/04/2018 23:27   Dg Chest Portable 1 View  Result Date: 05/04/2018 CLINICAL DATA:  Short  of breath EXAM: PORTABLE CHEST 1 VIEW COMPARISON:  03/20/2016 FINDINGS: Pleural thickening at the right apex with fibrotic and postoperative changes and volume loss in the right lung are stable. No pneumothorax. Left lung is hyperaerated and clear. Normal heart size. IMPRESSION: Chronic changes in the right hemithorax. No active cardiopulmonary disease. Electronically Signed   By: Marybelle Killings M.D.   On: 05/04/2018 20:14     LOS: 1 day   Oren Binet, MD  [facility] Pager:[phone]  If 7PM-7AM, please contact night-coverage [email or web address] Password [email or web address] 05/06/2018, 11:12 AM

## 2018-05-07 ENCOUNTER — Inpatient Hospital Stay (HOSPITAL_COMMUNITY): Payer: Medicare Other

## 2018-05-07 DIAGNOSIS — I25119 Atherosclerotic heart disease of native coronary artery with unspecified angina pectoris: Secondary | ICD-10-CM

## 2018-05-07 DIAGNOSIS — R079 Chest pain, unspecified: Secondary | ICD-10-CM

## 2018-05-07 DIAGNOSIS — I2510 Atherosclerotic heart disease of native coronary artery without angina pectoris: Secondary | ICD-10-CM

## 2018-05-07 LAB — NM MYOCAR MULTI W/SPECT W/WALL MOTION / EF
CSEPED: 0 min
Estimated workload: 1 METS
Exercise duration (sec): 0 s
MPHR: 159 {beats}/min
Peak HR: 112 {beats}/min
Percent HR: 70 %
RPE: 0
Rest HR: 78 {beats}/min

## 2018-05-07 LAB — GLUCOSE, CAPILLARY
GLUCOSE-CAPILLARY: 130 mg/dL — AB (ref 65–99)
Glucose-Capillary: 166 mg/dL — ABNORMAL HIGH (ref 65–99)

## 2018-05-07 MED ORDER — TECHNETIUM TC 99M TETROFOSMIN IV KIT
10.0000 | PACK | Freq: Once | INTRAVENOUS | Status: AC | PRN
  Administered 2018-05-07: 10 via INTRAVENOUS

## 2018-05-07 MED ORDER — PREDNISONE 10 MG PO TABS: ORAL_TABLET | ORAL | 0 refills | Status: DC

## 2018-05-07 MED ORDER — REGADENOSON 0.4 MG/5ML IV SOLN
0.4000 mg | Freq: Once | INTRAVENOUS | Status: AC
  Administered 2018-05-07: 0.4 mg via INTRAVENOUS
  Filled 2018-05-07: qty 5

## 2018-05-07 MED ORDER — TECHNETIUM TC 99M TETROFOSMIN IV KIT: 30.0000 | PACK | Freq: Once | INTRAVENOUS | Status: DC | PRN

## 2018-05-07 MED ORDER — REGADENOSON 0.4 MG/5ML IV SOLN
INTRAVENOUS | Status: AC
  Filled 2018-05-07: qty 5

## 2018-05-07 NOTE — Progress Notes (Addendum)
Progress Note  Patient Name: Stephen [name] Date of Encounter: 05/07/2018  Primary Cardiologist: Mertie Moores, MD   Subjective   No chest pain, chronic SOB  Inpatient Medications    Scheduled Meds: . aspirin  81 mg Oral Daily  . enoxaparin (LOVENOX) injection  40 mg Subcutaneous Q24H  . feeding supplement (ENSURE ENLIVE)  237 mL Oral BID BM  . fluticasone  1 spray Each Nare Daily  . insulin aspart  0-9 Units Subcutaneous TID WC  . ipratropium-albuterol  3 mL Nebulization TID  . levothyroxine  62.5 mcg Oral QAC breakfast  . methylPREDNISolone (SOLU-MEDROL) injection  40 mg Intravenous Q8H  . mirtazapine  15 mg Oral QPM  . pantoprazole  40 mg Oral Daily  . pravastatin  40 mg Oral q1800  . regadenoson      . sodium chloride flush  3 mL Intravenous Q12H  . sodium chloride flush  3 mL Intravenous Q12H  . temazepam  30 mg Oral QHS  . umeclidinium-vilanterol  1 puff Inhalation Daily   Continuous Infusions: . sodium chloride     PRN Meds: sodium chloride, acetaminophen **OR** acetaminophen, albuterol, benzonatate, bisacodyl, HYDROcodone-acetaminophen, ondansetron **OR** ondansetron (ZOFRAN) IV, senna-docusate, sodium chloride flush   Vital Signs    Vitals:   05/07/18 0954 05/07/18 0955 05/07/18 0957 05/07/18 1000  BP: (!) 145/89 (!) 147/97 (!) 145/95 (!) 148/91  Pulse:      Resp:      Temp:      TempSrc:      SpO2:      Weight:        Intake/Output Summary (Last 24 hours) at 05/07/2018 1005 Last data filed at 05/07/2018 0900 Gross per 24 hour  Intake 120 ml  Output -  Net 120 ml   Filed Weights   05/05/18 1602  Weight: 166 lb 3.2 oz (75.4 kg)    Telemetry    SR with intermittent RBBB - Personally Reviewed  ECG    No new - Personally Reviewed  Physical Exam   GEN: No acute distress.   Neck: No JVD Cardiac: RRR, no murmurs, rubs, or gallops.  Respiratory: diminished to auscultation bilaterally with occ rhonchi. GI: Soft, nontender, non-distended    MS: No edema; No deformity. Neuro:  Nonfocal  Psych: Normal affect   Labs    Chemistry Recent Labs  Lab 05/04/18 1940 05/05/18 0215 05/06/18 0629  NA 137 136 142  K 3.4* 3.5 4.9  CL 96* 97* 104  CO2 28 27 31   GLUCOSE 201* 219* 130*  BUN 12 14 16   CREATININE 0.85 0.92 0.65  CALCIUM 8.8* 8.6* 8.8*  GFRNONAA >60 >60 >60  GFRAA >60 >60 >60  ANIONGAP 13 12 7      Hematology Recent Labs  Lab 05/04/18 1940 05/05/18 0215 05/06/18 0629  WBC 23.5* 11.9* 25.3*  RBC 5.18 4.81 5.08  HGB 14.3 13.0 13.8  HCT 43.7 40.5 43.8  MCV 84.4 84.2 86.2  MCH 27.6 27.0 27.2  MCHC 32.7 32.1 31.5  RDW 13.2 13.3 13.5  PLT 326 259 292    Cardiac Enzymes Recent Labs  Lab 05/05/18 0215 05/05/18 0757 05/05/18 1600  TROPONINI 0.60* 0.32* 0.21*    Recent Labs  Lab 05/04/18 1942  TROPIPOC 0.12*     BNPNo results for input(s): BNP, PROBNP in the last 168 hours.   DDimer No results for input(s): DDIMER in the last 168 hours.   Radiology    No results found.  Cardiac Studies  05/06/18  Echo  Study Conclusions  - Procedure narrative: Transthoracic echocardiography. Image   quality was suboptimal. The study was technically difficult, as a   result of poor acoustic windows. - Left ventricle: The cavity size was normal. Wall thickness was   increased in a pattern of mild LVH. Systolic function was normal.   The estimated ejection fraction was in the range of 55% to 60%.   Basal to mid inferior hypokinesis. Doppler parameters are   consistent with abnormal left ventricular relaxation (grade 1   diastolic dysfunction). The E/e&' ratio is >15, suggesting   elevated LV filling pressure. - Aortic valve: Sclerosis without stenosis. There was no   regurgitation. - Mitral valve: Calcified annulus. Mildly thickened leaflets .   There was trivial regurgitation. - Left atrium: The atrium was normal in size. - Inferior vena cava: The vessel was dilated. The respirophasic   diameter  changes were blunted (< 50%), consistent with elevated   central venous pressure. - Pericardium, extracardiac: A trivial pericardial effusion was   identified posterior to the heart.  Impressions:  - Technically difficult study with primarily subcostal view and   off-axis images. LVEF 55-60% - there appears to be basal to mid   inferior and possible lateral wall hypokinesis, grade 1 DD and   elevated LV filling pressure, the IVC is dilated and there is a   trivial posterior pericardial effusion.  Patient Profile     62 y.o. male with a hx of COPD, lung cancer, carcinoma of the left vocal cord, now admitted with acute SOB-respiratory failure, not relieved with Nebs at home, and progressive over 2 weeks.    Assessment & Plan    SOB with exertion, with severe COPD and elevated troponin with pk 0.60.  Intermittent RBBB seems to be with rate. New  Abnormal Echo with inf wall defect.    lexiscan myoview ordered.  For questions or updates, please contact [place] Please consult [email or web address] for contact info under Cardiology/STEMI.      Signed, Cecilie Kicks, NP  05/07/2018, 10:05 AM    Attending Note:   The patient was seen and examined.  Agree with assessment and plan as noted above.  Changes made to the above note as needed.  Patient seen and independently examined with Cecilie Kicks, NP .   We discussed all aspects of the encounter. I agree with the assessment and plan as stated above.  1.  Probable coronary artery disease: The patient had a Myoview study this morning which revealed an inferior wall myocardial infarction.  The defect is fixed.  There is no evidence of ischemia.  The patient is not having any further episodes of chest pain.  We discussed the possibility of doing further testing.  He does not want to have a heart cath at this time.  There is no indication that there is any reversible ischemia on his Myoview study.  It is likely that he had an inferior wall  myocardial infarction over the past several years.  ECG shows normal sinus rhythm today.  Right bundle branch block. He stable and is ready to go home.  I will see him in the office in several weeks.he stable and is ready to go home. I'll see him in the office in several weeks.  If he has any recurrent episodes of chest pain.History is remarkable episodes of chest pain, I would have a low threshold to cath him.  Probable threshold.  Overall he does not appear to be  a great candidate for heart catheterization although I would  would not withhold it if he is having active antan  he has metastatic non-small cell lung cancer   He has metastatic non-small cell lung cancer.  He has had 40 pound weight loss over the past year or so..    I withhold cath from him if he is having active angina.  Have spent a total of 40 minutes with patient reviewing hospital  notes , telemetry, EKGs, labs and examining patient as well as establishing an assessment and plan that was discussed with the patient. > 50% of time was spent in direct patient care.    Thayer Headings, Brooke Bonito., MD, [facility] 05/07/2018, 2:11 PM [address]  [place] Pager [phone]

## 2018-05-07 NOTE — Discharge Summary (Signed)
PATIENT DETAILS Name: Stephen [name] Age: 62 y.o. Sex: male Date of Birth: 07-12-1956 MRN: [id number]. Admitting Physician: Stephen Bulls, MD VHQ:IONGEX, Stephen [place], MD  Admit Date: 05/04/2018 Discharge date: 05/07/2018  Recommendations for Outpatient Follow-up:  1. Follow up with PCP in 1-2 weeks 2. Please obtain BMP/CBC in one week  Admitted From:  Home  Disposition: [place]: No  Equipment/Devices: None  Discharge Condition: Stable  CODE STATUS: FULL CODE  Diet recommendation:  Heart Healthy   Brief Summary: See H&P, Labs, Consult and Test reports for all details in brief, Patient is a 62 y.o. 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  See below for further details.  Brief Hospital Course: Acute on chronic hypoxic respiratory failure secondary to COPD exacerbation: Clinically improved-no longer on BiPAP-stable on oxygen via nasal cannula.  Claims that he is on home O2 as needed.  He is doing really well-moving air well-no rhonchi at all during my exam this morning.  Will taper off steroids in 1 week, no longer on antimicrobial therapy-CT angiogram did not show any pneumonia.  Elevated troponins: Likely secondary demand ischemia-however echocardiogram shows basal and mid inferior wall hypokinesis that are new when compared to his prior echo.  EF is preserved.    Seen by cardiology-underwent a nuclear stress test that did not show any ischemia, showed a fixed defect in the inferior wall.  Continue aspirin and statin on discharge.     Leukocytosis: Likely secondary to steroid use-clinically improved-no indication of any other infection at this time.  All antimicrobial therapy was stopped on 5/8-he was monitored overnight-he continues to improve off antimicrobial therapy.   Hypothyroidism: Continue Synthroid  Dyslipidemia: Continue statin  GERD: PPI  History of laryngeal/lung cancer: Stable for outpatient follow-up with his oncologist.  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[facility])   Elevated troponin   Acute respiratory failure with hypoxia ([place])   COPD with acute exacerbation [facility])   Discharge Instructions:  Activity:  As tolerated with Full fall precautions use walker/cane & assistance as needed  Discharge Instructions    Call MD for:  difficulty breathing, headache or visual disturbances   Complete by:  As directed    Diet - low sodium heart healthy   Complete by:  As directed    Discharge instructions   Complete by:  As directed    Follow with Primary MD  Stephen Infante, MD in 1 week  Please get a complete blood count and chemistry panel checked by your Primary MD at your next visit, and again as instructed by your Primary MD.  Get Medicines reviewed and adjusted: Please take all your medications with you for your next visit with your Primary MD  Laboratory/radiological data: Please request your Primary MD to go over all hospital tests and procedure/radiological results at the follow up, please ask your Primary MD to get all Hospital records sent to his/her office.  In some cases, they will be blood work, cultures and biopsy results pending at the time of  your discharge. Please request that your primary care M.D. follows up on these results.  Also Note the following: If you experience worsening of your admission symptoms, develop shortness of  breath, life threatening emergency, suicidal or homicidal thoughts you must seek medical attention immediately by calling 911 or calling your MD immediately  if symptoms less severe.  You must read complete instructions/literature along with all the possible adverse reactions/side effects for all the Medicines you take and that have been prescribed to you. Take any new Medicines after you have completely understood and accpet all the possible adverse reactions/side effects.   Do not drive when taking Pain medications or sleeping medications (Benzodaizepines)  Do not take more than prescribed Pain, Sleep and Anxiety Medications. It is not advisable to combine anxiety,sleep and pain medications without talking with your primary care practitioner  Special Instructions: If you have smoked or chewed Tobacco  in the last 2 yrs please stop smoking, stop any regular Alcohol  and or any Recreational drug use.  Wear Seat belts while driving.  Please note: You were cared for by a hospitalist during your hospital stay. Once you are discharged, your primary care physician will handle any further medical issues. Please note that NO REFILLS for any discharge medications will be authorized once you are discharged, as it is imperative that you return to your primary care physician (or establish a relationship with a primary care physician if you do not have one) for your post hospital discharge needs so that they can reassess your need for medications and monitor your lab values.   Increase activity slowly   Complete by:  As directed      Allergies as of 05/07/2018      Reactions   Codeine    Reaction ??   Cymbalta [duloxetine Hcl] Nausea And Vomiting   Montelukast Sodium Other (See Comments)   Causes sinusitis      Medication List    TAKE these medications   albuterol 108 (90 Base) MCG/ACT inhaler Commonly known as:  PROVENTIL HFA;VENTOLIN HFA Inhale 1-2 puffs into the lungs every 6 (six) hours as  needed for wheezing or shortness of breath.   albuterol (2.5 MG/3ML) 0.083% nebulizer solution Commonly known as:  PROVENTIL Take 2.5 mg by nebulization 3 (three) times daily as needed for wheezing or shortness of breath.   aspirin 81 MG chewable tablet Chew 81 mg by mouth daily.   benzonatate 100 MG capsule Commonly known as:  TESSALON Take 100 mg by mouth every 8 (eight) hours as needed for cough.   Fluticasone-Umeclidin-Vilant 100-62.5-25 MCG/INH Aepb Commonly known as:  TRELEGY ELLIPTA Inhale 1 puff into the lungs daily.   levothyroxine 125 MCG tablet Commonly known as:  SYNTHROID, LEVOTHROID Take 62.5 mcg by mouth daily before breakfast.   mirtazapine 15 MG tablet Commonly known as:  REMERON Take 15 mg by mouth every evening.   nitroGLYCERIN 0.4 MG SL tablet Commonly known as:  NITROSTAT Place 1 tablet (0.4 mg total) under the tongue every 5 (five) minutes as needed for chest pain.   omeprazole 40 MG capsule Commonly known as:  PRILOSEC Take 1 capsule (40 mg total) by mouth daily.   pravastatin 40 MG tablet Commonly known as:  PRAVACHOL Take 40 mg by mouth at bedtime.   predniSONE 10 MG tablet Commonly known as:  DELTASONE Take 4 tablets (40 mg) daily for 2 days, then, Take 3 tablets (30 mg) daily for 2 days, then, Take 2 tablets (20 mg)  daily for 2 days, then, Take 1 tablets (10 mg) daily for 1 days, then stop   temazepam 30 MG capsule Commonly known as:  RESTORIL Take 30 mg by mouth at bedtime.   Vitamin D (Ergocalciferol) 50000 units Caps capsule Commonly known as:  DRISDOL Take 50,000 Units by mouth every 14 (fourteen) days.      Follow-up Information    Stephen Infante, MD. Schedule an appointment as soon as possible for a visit in 1 week(s).   Specialty:  Internal Medicine Contact information: [place] [address] [phone]        Nahser, Wonda Cheng, MD. Schedule an appointment as soon as possible for a visit in 2 week(s).    Specialty:  Cardiology Contact information: [address]  [address] [phone]          Allergies  Allergen Reactions  . Codeine     Reaction ??  Marland Kitchen Cymbalta [Duloxetine Hcl] Nausea And Vomiting  . Montelukast Sodium Other (See Comments)    Causes sinusitis     Consultations:   cardiology  Other Procedures/Studies: Ct Angio Chest Pe W And/or Wo Contrast  Result Date: 05/04/2018 CLINICAL DATA:  62 year old male with sudden onset shortness of breath. Concern for pulmonary embolism. History of right-sided lung cancer treated with right upper lobectomy and chemo and radiation therapy. EXAM: CT ANGIOGRAPHY CHEST WITH CONTRAST TECHNIQUE: Multidetector CT imaging of the chest was performed using the standard protocol during bolus administration of intravenous contrast. Multiplanar CT image reconstructions and MIPs were obtained to evaluate the vascular anatomy. CONTRAST:  ISOVUE-370 IOPAMIDOL (ISOVUE-370) INJECTION 76% COMPARISON:  Chest radiograph dated 05/04/2018 and CT dated 06/09/2017 FINDINGS: Cardiovascular: There is no cardiomegaly. There is a small pericardial effusion which has increased in size compared to the prior CT and measures 2 cm in thickness anterior to the heart. Echocardiogram may provide better evaluation. The thoracic aorta is unremarkable. There is no CT evidence of pulmonary embolism. Mediastinum/Nodes: There is no hilar or mediastinal adenopathy. There is a small hiatal hernia. The esophagus is grossly unremarkable. No mediastinal fluid collection. Lungs/Pleura: Postsurgical changes of right upper lobectomy with compensatory hyperexpansion of the right middle and lower lobe. Right apical masslike density appears similar to prior CT and most consistent with postsurgical changes. There is associated traction and mild shift of the mediastinum into the right hemithorax similar to prior CT. There is a background of emphysema. No new consolidative  changes or suspicious lesions. There is no pleural effusion or pneumothorax. The central airways are patent. Upper Abdomen: No acute abnormality. Musculoskeletal: Postsurgical changes of right chest wall thoracotomy. No acute osseous pathology. No suspicious bone lesions. Review of the MIP images confirms the above findings. IMPRESSION: 1. No acute intrathoracic pathology. No CT evidence of pulmonary embolism. 2. Postsurgical changes of right upper lobectomy as seen on the prior CT. No new masses or evidence of recurrent disease. 3. COPD. 4. Small pericardial effusion, increased compared to the prior chest CT. Electronically Signed   By: Anner Crete M.D.   On: 05/04/2018 23:27   Nm Myocar Multi W/spect W/wall Motion / Ef  Result Date: 05/07/2018 CLINICAL DATA:  Chest pain EXAM: MYOCARDIAL IMAGING WITH SPECT (REST AND PHARMACOLOGIC-STRESS) GATED LEFT VENTRICULAR WALL MOTION STUDY LEFT VENTRICULAR EJECTION FRACTION TECHNIQUE: Standard myocardial SPECT imaging was performed after resting intravenous injection of 10 mCi Tc-57m tetrofosmin. Subsequently, intravenous infusion of Lexiscan was performed under the supervision of the Cardiology staff. At peak effect of  the drug, 30 mCi Tc-52m tetrofosmin was injected intravenously and standard myocardial SPECT imaging was performed. Quantitative gated imaging was also performed to evaluate left ventricular wall motion, and estimate left ventricular ejection fraction. COMPARISON:  None. FINDINGS: Perfusion: Large fixed defect of the inferior wall. No significant decreased activity in the left ventricle on stress imaging to suggest reversible ischemia or infarction. Wall Motion: Inferior wall hypokinesis and septal dyskinesis. Left Ventricular Ejection Fraction: 61 % End diastolic volume 85 ml End systolic volume 33 ml IMPRESSION: 1. Large fixed inferior wall defect. No significant reversible ischemia or infarction. 2. Inferior wall hypokinesis and septal dyskinesis. 3.  Left ventricular ejection fraction 61% 4. Non invasive risk stratification*: Intermediate *2012 Appropriate Use Criteria for Coronary Revascularization Focused Update: J Am Coll Cardiol. 6144;31(5):400-867. http://content.[email or web address]?articleid=[email or web address] Electronically Signed   By: Jerilynn Mages.  Shick M.D.   On: 05/07/2018 11:14   Dg Chest Portable 1 View  Result Date: 05/04/2018 CLINICAL DATA:  Short of breath EXAM: PORTABLE CHEST 1 VIEW COMPARISON:  03/20/2016 FINDINGS: Pleural thickening at the right apex with fibrotic and postoperative changes and volume loss in the right lung are stable. No pneumothorax. Left lung is hyperaerated and clear. Normal heart size. IMPRESSION: Chronic changes in the right hemithorax. No active cardiopulmonary disease. Electronically Signed   By: Marybelle Killings M.D.   On: 05/04/2018 20:14      TODAY-DAY OF DISCHARGE:  Subjective:   Stephen [name] today has no headache,no chest abdominal pain,no new weakness tingling or numbness, feels much better wants to go home today.   Objective:   Blood pressure (!) 148/91, pulse 99, temperature (!) 97.5 F (36.4 C), temperature source Oral, resp. rate 18, weight 75.4 kg (166 lb 3.2 oz), SpO2 100 %.  Intake/Output Summary (Last 24 hours) at 05/07/2018 1203 Last data filed at 05/07/2018 0900 Gross per 24 hour  Intake 120 ml  Output -  Net 120 ml   Filed Weights   05/05/18 1602  Weight: 75.4 kg (166 lb 3.2 oz)    Exam: Awake Alert, Oriented *3, No new F.N deficits, Normal affect [place].AT,PERRAL Supple Neck,No JVD, No cervical lymphadenopathy appriciated.  Symmetrical Chest wall movement, Good air movement bilaterally, CTAB RRR,No Gallops,Rubs or new Murmurs, No Parasternal Heave +ve B.Sounds, Abd Soft, Non tender, No organomegaly appriciated, No rebound -guarding or rigidity. No Cyanosis, Clubbing or edema, No new Rash or bruise   PERTINENT RADIOLOGIC STUDIES: Ct Angio Chest Pe W And/or Wo Contrast  Result  Date: 05/04/2018 CLINICAL DATA:  62 year old male with sudden onset shortness of breath. Concern for pulmonary embolism. History of right-sided lung cancer treated with right upper lobectomy and chemo and radiation therapy. EXAM: CT ANGIOGRAPHY CHEST WITH CONTRAST TECHNIQUE: Multidetector CT imaging of the chest was performed using the standard protocol during bolus administration of intravenous contrast. Multiplanar CT image reconstructions and MIPs were obtained to evaluate the vascular anatomy. CONTRAST:  ISOVUE-370 IOPAMIDOL (ISOVUE-370) INJECTION 76% COMPARISON:  Chest radiograph dated 05/04/2018 and CT dated 06/09/2017 FINDINGS: Cardiovascular: There is no cardiomegaly. There is a small pericardial effusion which has increased in size compared to the prior CT and measures 2 cm in thickness anterior to the heart. Echocardiogram may provide better evaluation. The thoracic aorta is unremarkable. There is no CT evidence of pulmonary embolism. Mediastinum/Nodes: There is no hilar or mediastinal adenopathy. There is a small hiatal hernia. The esophagus is grossly unremarkable. No mediastinal fluid collection. Lungs/Pleura: Postsurgical changes of right upper lobectomy with compensatory hyperexpansion of the right middle and lower  lobe. Right apical masslike density appears similar to prior CT and most consistent with postsurgical changes. There is associated traction and mild shift of the mediastinum into the right hemithorax similar to prior CT. There is a background of emphysema. No new consolidative changes or suspicious lesions. There is no pleural effusion or pneumothorax. The central airways are patent. Upper Abdomen: No acute abnormality. Musculoskeletal: Postsurgical changes of right chest wall thoracotomy. No acute osseous pathology. No suspicious bone lesions. Review of the MIP images confirms the above findings. IMPRESSION: 1. No acute intrathoracic pathology. No CT evidence of pulmonary embolism. 2.  Postsurgical changes of right upper lobectomy as seen on the prior CT. No new masses or evidence of recurrent disease. 3. COPD. 4. Small pericardial effusion, increased compared to the prior chest CT. Electronically Signed   By: Anner Crete M.D.   On: 05/04/2018 23:27   Nm Myocar Multi W/spect W/wall Motion / Ef  Result Date: 05/07/2018 CLINICAL DATA:  Chest pain EXAM: MYOCARDIAL IMAGING WITH SPECT (REST AND PHARMACOLOGIC-STRESS) GATED LEFT VENTRICULAR WALL MOTION STUDY LEFT VENTRICULAR EJECTION FRACTION TECHNIQUE: Standard myocardial SPECT imaging was performed after resting intravenous injection of 10 mCi Tc-9m tetrofosmin. Subsequently, intravenous infusion of Lexiscan was performed under the supervision of the Cardiology staff. At peak effect of the drug, 30 mCi Tc-79m tetrofosmin was injected intravenously and standard myocardial SPECT imaging was performed. Quantitative gated imaging was also performed to evaluate left ventricular wall motion, and estimate left ventricular ejection fraction. COMPARISON:  None. FINDINGS: Perfusion: Large fixed defect of the inferior wall. No significant decreased activity in the left ventricle on stress imaging to suggest reversible ischemia or infarction. Wall Motion: Inferior wall hypokinesis and septal dyskinesis. Left Ventricular Ejection Fraction: 61 % End diastolic volume 85 ml End systolic volume 33 ml IMPRESSION: 1. Large fixed inferior wall defect. No significant reversible ischemia or infarction. 2. Inferior wall hypokinesis and septal dyskinesis. 3. Left ventricular ejection fraction 61% 4. Non invasive risk stratification*: Intermediate *2012 Appropriate Use Criteria for Coronary Revascularization Focused Update: J Am Coll Cardiol. 6433;29(5):188-416. http://content.[email or web address].aspx?articleid=[email or web address] Electronically Signed   By: Jerilynn Mages.  Shick M.D.   On: 05/07/2018 11:14   Dg Chest Portable 1 View  Result Date: 05/04/2018 CLINICAL DATA:  Short of  breath EXAM: PORTABLE CHEST 1 VIEW COMPARISON:  03/20/2016 FINDINGS: Pleural thickening at the right apex with fibrotic and postoperative changes and volume loss in the right lung are stable. No pneumothorax. Left lung is hyperaerated and clear. Normal heart size. IMPRESSION: Chronic changes in the right hemithorax. No active cardiopulmonary disease. Electronically Signed   By: Marybelle Killings M.D.   On: 05/04/2018 20:14     PERTINENT LAB RESULTS: CBC: Recent Labs    05/05/18 0215 05/06/18 0629  WBC 11.9* 25.3*  HGB 13.0 13.8  HCT 40.5 43.8  PLT 259 292   CMET CMP     Component Value Date/Time   NA 142 05/06/2018 0629   NA 140 06/09/2017 1116   K 4.9 05/06/2018 0629   K 4.0 06/09/2017 1116   CL 104 05/06/2018 0629   CL 106 09/14/2012 0808   CO2 31 05/06/2018 0629   CO2 28 06/09/2017 1116   GLUCOSE 130 (H) 05/06/2018 0629   GLUCOSE 97 06/09/2017 1116   GLUCOSE 98 09/14/2012 0808   BUN 16 05/06/2018 0629   BUN 16.0 06/09/2017 1116   CREATININE 0.65 05/06/2018 0629   CREATININE 1.0 06/09/2017 1116   CALCIUM 8.8 (L) 05/06/2018 0629   CALCIUM 9.8 06/09/2017 1116  PROT 7.5 06/09/2017 1116   ALBUMIN 4.1 06/09/2017 1116   AST 19 06/09/2017 1116   ALT 16 06/09/2017 1116   ALKPHOS 115 06/09/2017 1116   BILITOT 0.89 06/09/2017 1116   GFRNONAA >60 05/06/2018 0629   GFRAA >60 05/06/2018 0629    GFR Estimated Creatinine Clearance: 100.1 mL/min (by C-G formula based on SCr of 0.65 mg/dL). No results for input(s): LIPASE, AMYLASE in the last 72 hours. Recent Labs    05/05/18 0215 05/05/18 0757 05/05/18 1600  TROPONINI 0.60* 0.32* 0.21*   Invalid input(s): POCBNP No results for input(s): DDIMER in the last 72 hours. No results for input(s): HGBA1C in the last 72 hours. No results for input(s): CHOL, HDL, LDLCALC, TRIG, CHOLHDL, LDLDIRECT in the last 72 hours. No results for input(s): TSH, T4TOTAL, T3FREE, THYROIDAB in the last 72 hours.  Invalid input(s): FREET3 No results  for input(s): VITAMINB12, FOLATE, FERRITIN, TIBC, IRON, RETICCTPCT in the last 72 hours. Coags: No results for input(s): INR in the last 72 hours.  Invalid input(s): PT Microbiology: No results found for this or any previous visit (from the past 240 hour(s)).  FURTHER DISCHARGE INSTRUCTIONS:  Get Medicines reviewed and adjusted: Please take all your medications with you for your next visit with your Primary MD  Laboratory/radiological data: Please request your Primary MD to go over all hospital tests and procedure/radiological results at the follow up, please ask your Primary MD to get all Hospital records sent to his/her office.  In some cases, they will be blood work, cultures and biopsy results pending at the time of your discharge. Please request that your primary care M.D. goes through all the records of your hospital data and follows up on these results.  Also Note the following: If you experience worsening of your admission symptoms, develop shortness of breath, life threatening emergency, suicidal or homicidal thoughts you must seek medical attention immediately by calling 911 or calling your MD immediately  if symptoms less severe.  You must read complete instructions/literature along with all the possible adverse reactions/side effects for all the Medicines you take and that have been prescribed to you. Take any new Medicines after you have completely understood and accpet all the possible adverse reactions/side effects.   Do not drive when taking Pain medications or sleeping medications (Benzodaizepines)  Do not take more than prescribed Pain, Sleep and Anxiety Medications. It is not advisable to combine anxiety,sleep and pain medications without talking with your primary care practitioner  Special Instructions: If you have smoked or chewed Tobacco  in the last 2 yrs please stop smoking, stop any regular Alcohol  and or any Recreational drug use.  Wear Seat belts while  driving.  Please note: You were cared for by a hospitalist during your hospital stay. Once you are discharged, your primary care physician will handle any further medical issues. Please note that NO REFILLS for any discharge medications will be authorized once you are discharged, as it is imperative that you return to your primary care physician (or establish a relationship with a primary care physician if you do not have one) for your post hospital discharge needs so that they can reassess your need for medications and monitor your lab values.  Total Time spent coordinating discharge including counseling, education and face to face time equals 35 minutes.  SignedOren Binet 05/07/2018 12:03 PM

## 2018-05-07 NOTE — Progress Notes (Signed)
lexiscan completed without complications, nuc results pending

## 2018-05-21 NOTE — Progress Notes (Signed)
Cardiology Office Note:    Date:  05/22/2018   ID:  Stephen [name], DOB 12-Oct-1956, MRN [id number]  PCP:  [name], MD  Cardiologist:  Mertie Moores, MD   Referring MD: [name], MD   Chief Complaint  Patient presents with  . Hospitalization Follow-up    COPD exacerbation; elev Tn, abnormal Myoview    History of Present Illness:    Stephen [name] is a 62 y.o. 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  He was evaluated remotely by Dr. Debara Pickett in 2014 for chest pain.  A Nuclear stress test was normal.  He had recurrence of cancer and underwent partial laryngectomy in February 2019.    He was admitted 5/6-5/9 with progressive shortness of breath.  He was managed for COPD exacerbation.  He had minimally elevated troponin levels thought to be related to demand ischemia.  Echocardiogram demonstrated normal LV function with mild diastolic dysfunction and inferior hypokinesis.  There was a trivial pericardial effusion.  He was seen by cardiology.  Nuclear stress test demonstrated large fixed inferior defect.  There was no ischemia.  As he was not having chest pain, it was felt that medical therapy could be continued.  Cardiac catheterization will need to be considered if he had symptoms of chest discomfort.  Stephen [name] returns for post hospital follow up.  He is here with his girlfriend.  He continues to feel poorly.  He is fatigued.  He is also under tremendous amount of stress.  His breathing is fairly stable.  But, he does get short of breath with minimal activity.  He has occasional episodes of left-sided chest discomfort described as an ache.  This is not related to activity.  He has no associated radiating symptoms.  It lasts a minute or 2.  He has not taken nitroglycerin.  He has to sleep on incline.  He denies lower extremity swelling.  He denies syncope.  Prior CV studies:   The following  studies were reviewed today:  Nuclear stress test 05/07/2018 IMPRESSION: 1. Large fixed inferior wall defect. No significant reversible ischemia or infarction. 2. Inferior wall hypokinesis and septal dyskinesis 3. Left ventricular ejection fraction 61% 4. Non invasive risk stratification*: Intermediate  Echo 05/06/2018 Mild LVH, EF 55-60, inferior HK, grade 1 diastolic dysfunction, aortic sclerosis, no aortic stenosis, MAC, trivial MR, trivial pericardial effusion  Chest CTA 05/04/2018 IMPRESSION: 1. No acute intrathoracic pathology. No CT evidence of pulmonary embolism. 2. Postsurgical changes of right upper lobectomy as seen on the prior CT. No new masses or evidence of recurrent disease. 3. COPD. 4. Small pericardial effusion, increased compared to the prior chest CT.  Nuclear stress test 08/17/2013 Overall Impression:  Low risk stress nuclear study with bowel artifact obscuring the inferoseptal wall.  No evidence of ischemia or infarction.  EF 72  Echo 08/09/2013 EF 55-60  Carotid US 01/13/2012 No significant ICA stenosis  Past Medical History:  Diagnosis Date  . Allergic rhinitis, cause unspecified   . Anxiety   . Blindness of left eye   . Complication of anesthesia    difficulty awaking after colonoscopy   . Depression   . Dyspnea   . GERD (gastroesophageal reflux disease)   . Hypothyroidism   . Lung cancer ([place])    lung ca dx 06, has had chemo and radiation  . Osteoporosis   . Other emphysema ([place])    copd  . Throat cancer (  [place])    throat ca dx 2007  . Thrombosed hemorrhoids   . Tubular adenoma of colon 2009  . Unspecified vitamin D deficiency    Surgical Hx: The patient  has a past surgical history that includes Lung lobectomy; Rotator cuff repair (Left); Knee surgery (Left, 1972); Shoulder surgery (Right); Throat surgery; transthoracic echocardiogram (01/13/2012); Hernia repair; Colonoscopy; and Microlaryngoscopy with co2 laser and excision of vocal cord lesion  (N/A, 12/19/2017).   Current Medications: Current Meds  Medication Sig  . albuterol (PROVENTIL HFA;VENTOLIN HFA) 108 (90 Base) MCG/ACT inhaler Inhale 1-2 puffs into the lungs every 6 (six) hours as needed for wheezing or shortness of breath.  Marland Kitchen albuterol (PROVENTIL) (2.5 MG/3ML) 0.083% nebulizer solution Take 2.5 mg by nebulization 3 (three) times daily as needed for wheezing or shortness of breath.   Marland Kitchen aspirin 81 MG chewable tablet Chew 81 mg by mouth daily.  . benzonatate (TESSALON) 100 MG capsule Take 100 mg by mouth every 8 (eight) hours as needed for cough.  . Fluticasone-Umeclidin-Vilant (TRELEGY ELLIPTA) 100-62.5-25 MCG/INH AEPB Inhale 1 puff into the lungs daily.  Marland Kitchen levothyroxine (SYNTHROID, LEVOTHROID) 125 MCG tablet Take 62.5 mcg by mouth daily before breakfast.   . mirtazapine (REMERON) 15 MG tablet Take 15 mg by mouth every evening.  . nitroGLYCERIN (NITROSTAT) 0.4 MG SL tablet Place 1 tablet (0.4 mg total) under the tongue every 5 (five) minutes as needed for chest pain.  Marland Kitchen omeprazole (PRILOSEC) 40 MG capsule Take 1 capsule (40 mg total) by mouth daily.  . pravastatin (PRAVACHOL) 40 MG tablet Take 40 mg by mouth at bedtime.   . predniSONE (DELTASONE) 10 MG tablet Take 4 tablets (40 mg) daily for 2 days, then, Take 3 tablets (30 mg) daily for 2 days, then, Take 2 tablets (20 mg) daily for 2 days, then, Take 1 tablets (10 mg) daily for 1 days, then stop  . temazepam (RESTORIL) 30 MG capsule Take 30 mg by mouth at bedtime.   . Vitamin D, Ergocalciferol, (DRISDOL) 50000 UNITS CAPS Take 50,000 Units by mouth every 14 (fourteen) days.      Allergies:   Codeine; Cymbalta [duloxetine hcl]; and Montelukast sodium   Social History   Tobacco Use  . Smoking status: Former Smoker    Packs/day: 1.50    Years: 32.00    Pack years: 48.00    Types: Cigarettes    Last attempt to quit: 12/30/2004    Years since quitting: 13.4  . Smokeless tobacco: Never Used  . Tobacco comment: 1 ppd x 30  years  Substance Use Topics  . Alcohol use: Yes    Comment: 1- 2 beers monthly  . Drug use: No     Family Hx: The patient's family history includes COPD in his mother; Cancer in his mother; Cystic fibrosis in his sister; Heart disease in his father; Heart failure in his paternal grandmother; Mental illness in his brother; Prostate cancer in his father. There is no history of Colon cancer, Colon polyps, or Esophageal cancer.  ROS:   Please see the history of present illness.    ROS All other systems reviewed and are negative.   EKGs/Labs/Other Test Reviewed:    EKG:  EKG is  ordered today.  The ekg ordered today demonstrates normal sinus rhythm, heart rate 90, normal axis, nonspecific ST-T wave changes, QTC 420  Recent Labs: 06/09/2017: ALT 16 05/06/2018: BUN 16; Creatinine, Ser 0.65; Hemoglobin 13.8; Platelets 292; Potassium 4.9; Sodium 142   Recent Lipid Panel  No results found for: CHOL, TRIG, HDL, CHOLHDL, LDLCALC, LDLDIRECT  Physical Exam:    VS:  BP 110/78   Pulse 86   Ht _0  (1.778 m)   Wt 166 lb (75.3 kg)   SpO2 97%   BMI 23.82 kg/m     Wt Readings from Last 3 Encounters:  05/22/18 166 lb (75.3 kg)  05/05/18 166 lb 3.2 oz (75.4 kg)  04/08/18 169 lb 9.6 oz (76.9 kg)     Physical Exam  Constitutional: He is oriented to person, place, and time. He appears well-developed and well-nourished. No distress.  HENT:  Head: Normocephalic and atraumatic.  Neck: Neck supple. No JVD present.  Cardiovascular: Normal rate, regular rhythm, S1 normal and S2 normal.  No murmur heard. Pulmonary/Chest: He has decreased breath sounds. He has no wheezes.  Abdominal: Soft. There is no hepatomegaly.  Musculoskeletal: He exhibits no edema.  Neurological: He is alert and oriented to person, place, and time.  Skin: Skin is warm and dry.    ASSESSMENT & PLAN:    Coronary artery disease involving native coronary artery of native heart without angina pectoris Presumed CAD by recent  nuclear stress test demonstrating inferior scar but no ischemia.  He has progressive shortness of breath.  However, this seems to be more related to COPD and tracheal stenosis from his recent surgery.  He does have occasional atypical left-sided chest discomfort.  His ECG is unchanged.  We discussed the rationale for proceeding with further testing to include cardiac catheterization.  At this point, I am not convinced he needs cardiac catheterization.  I reviewed his case today with Dr. Meda Coffee (attending MD).  She reviewed his prior CT scans.  We feel that a coronary CTA would be a reasonable option.  -Continue aspirin, statin  -Arrange coronary CTA  -Follow-up with Dr. Acie Fredrickson 6 to 8 weeks  Chronic obstructive pulmonary disease, unspecified COPD type ([place]) Continue follow-up with pulmonology.  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  Hyperlipidemia, unspecified hyperlipidemia type Continue statin.  Dispo:  Return in about 8 weeks (around 07/17/2018) for Follow up after testing, w/ Dr. Acie Fredrickson, or Richardson Dopp, PA-C.   Medication Adjustments/Labs and Tests Ordered: Current medicines are reviewed at length with the patient today.  Concerns regarding medicines are outlined above.  Tests Ordered: Orders Placed This Encounter  Procedures  . CT CORONARY MORPH W/CTA COR W/SCORE W/CA W/CM &/OR WO/CM  . CT CORONARY FRACTIONAL FLOW RESERVE DATA PREP  . CT CORONARY FRACTIONAL FLOW RESERVE FLUID ANALYSIS  . Basic Metabolic Panel (BMET)  . EKG 12-Lead   Medication Changes: Meds ordered this encounter  Medications  . metoprolol tartrate (LOPRESSOR) 50 MG tablet    Sig: Take 1 tablet (50 mg total) by mouth once for 1 dose.    Dispense:  1 tablet    Refill:  0    THIS IS TO BE TAKEN 1 HOUR BEFORE CT    Signed, Richardson Dopp, PA-C  05/22/2018 1:08 PM    [facility] [address], [place], [place]  [address] Phone: [phone]; Fax: [phone]

## 2018-05-22 ENCOUNTER — Other Ambulatory Visit: Payer: Self-pay | Admitting: Physician Assistant

## 2018-05-22 ENCOUNTER — Encounter: Payer: Self-pay | Admitting: Physician Assistant

## 2018-05-22 ENCOUNTER — Ambulatory Visit: Payer: 59 | Admitting: Physician Assistant

## 2018-05-22 VITALS — BP 110/78 | HR 86 | Ht 70.0 in | Wt 166.0 lb

## 2018-05-22 DIAGNOSIS — E785 Hyperlipidemia, unspecified: Secondary | ICD-10-CM | POA: Diagnosis not present

## 2018-05-22 DIAGNOSIS — J449 Chronic obstructive pulmonary disease, unspecified: Secondary | ICD-10-CM | POA: Diagnosis not present

## 2018-05-22 DIAGNOSIS — I2510 Atherosclerotic heart disease of native coronary artery without angina pectoris: Secondary | ICD-10-CM

## 2018-05-22 DIAGNOSIS — C320 Malignant neoplasm of glottis: Secondary | ICD-10-CM

## 2018-05-22 MED ORDER — METOPROLOL TARTRATE 50 MG PO TABS: 50.0000 mg | ORAL_TABLET | Freq: Once | ORAL | 0 refills | Status: DC

## 2018-05-22 NOTE — Patient Instructions (Addendum)
Medication Instructions:  1. Your physician recommends that you continue on your current medications as directed. Please refer to the Current Medication list given to you today.   Labwork: BMET TO BE DONE 1 WEEK BEFORE CT  Testing/Procedures: CORONARY CT-A TO BE DONE AT [facility]  Follow-Up: DR. Acie Fredrickson IN 6-8 WEEKS   Any Other Special Instructions Will Be Listed Below (If Applicable).  If you need a refill on your cardiac medications before your next appointment, please call your pharmacy.   Please arrive at the [facility] main entrance of [facility] at xx:xx AM (30-45 minutes prior to test start time)  [facility] [place], [place] [address] [phone]  Proceed to the [facility] Radiology Department (First Floor).  Please follow these instructions carefully (unless otherwise directed):  Hold all erectile dysfunction medications at least 48 hours prior to test.  On the Night Before the Test: . Drink plenty of water. . Do not consume any caffeinated/decaffeinated beverages or chocolate 12 hours prior to your test. . Do not take any antihistamines 12 hours prior to your test. . If the patient has contrast allergy: ? Patient will need a prescription for Prednisone and very clear instructions (as follows): 1. Prednisone 50 mg - take 13 hours prior to test 2. Take another Prednisone 50 mg 7 hours prior to test 3. Take another Prednisone 50 mg 1 hour prior to test 4. Take Benadryl 50 mg 1 hour prior to test . Patient must complete all four doses of above prophylactic medications. . Patient will need a ride after test due to Benadryl.  On the Day of the Test: . Drink plenty of water. Do not drink any water within one hour of the test. . Do not eat any food 4 hours prior to the test. . You may take your regular medications prior to the test. . IF NOT ON A BETA BLOCKER - Take 50 mg of lopressor (metoprolol) one hour before the  test. . HOLD Furosemide morning of the test.  After the Test: . Drink plenty of water. . After receiving IV contrast, you may experience a mild flushed feeling. This is normal. . On occasion, you may experience a mild rash up to 24 hours after the test. This is not dangerous. If this occurs, you can take Benadryl 25 mg and increase your fluid intake. . If you experience trouble breathing, this can be serious. If it is severe call 911 IMMEDIATELY. If it is mild, please call our office.

## 2018-05-24 ENCOUNTER — Emergency Department (HOSPITAL_COMMUNITY)
Admission: EM | Admit: 2018-05-24 | Discharge: 2018-05-24 | Disposition: A | Discharge status: Home / Self Care | Payer: Medicare Other | Attending: Emergency Medicine | Admitting: Emergency Medicine

## 2018-05-24 ENCOUNTER — Other Ambulatory Visit: Payer: Self-pay

## 2018-05-24 ENCOUNTER — Encounter (HOSPITAL_COMMUNITY): Payer: Self-pay | Admitting: Emergency Medicine

## 2018-05-24 ENCOUNTER — Emergency Department (HOSPITAL_COMMUNITY): Payer: 59

## 2018-05-24 ENCOUNTER — Emergency Department (HOSPITAL_COMMUNITY)
Admission: EM | Admit: 2018-05-24 | Discharge: 2018-05-24 | Disposition: A | Discharge status: Home / Self Care | Payer: 59 | Attending: Emergency Medicine | Admitting: Emergency Medicine

## 2018-05-24 DIAGNOSIS — J449 Chronic obstructive pulmonary disease, unspecified: Secondary | ICD-10-CM | POA: Diagnosis not present

## 2018-05-24 DIAGNOSIS — Z87891 Personal history of nicotine dependence: Secondary | ICD-10-CM | POA: Diagnosis not present

## 2018-05-24 DIAGNOSIS — E039 Hypothyroidism, unspecified: Secondary | ICD-10-CM | POA: Insufficient documentation

## 2018-05-24 DIAGNOSIS — R0602 Shortness of breath: Secondary | ICD-10-CM | POA: Diagnosis present

## 2018-05-24 DIAGNOSIS — I2510 Atherosclerotic heart disease of native coronary artery without angina pectoris: Secondary | ICD-10-CM | POA: Insufficient documentation

## 2018-05-24 DIAGNOSIS — J441 Chronic obstructive pulmonary disease with (acute) exacerbation: Secondary | ICD-10-CM

## 2018-05-24 DIAGNOSIS — Z7982 Long term (current) use of aspirin: Secondary | ICD-10-CM | POA: Insufficient documentation

## 2018-05-24 DIAGNOSIS — J302 Other seasonal allergic rhinitis: Secondary | ICD-10-CM | POA: Insufficient documentation

## 2018-05-24 DIAGNOSIS — Z79899 Other long term (current) drug therapy: Secondary | ICD-10-CM | POA: Insufficient documentation

## 2018-05-24 DIAGNOSIS — Z85118 Personal history of other malignant neoplasm of bronchus and lung: Secondary | ICD-10-CM | POA: Insufficient documentation

## 2018-05-24 DIAGNOSIS — Z8501 Personal history of malignant neoplasm of esophagus: Secondary | ICD-10-CM | POA: Insufficient documentation

## 2018-05-24 LAB — CBC WITH DIFFERENTIAL/PLATELET
Abs Immature Granulocytes: 0.1 10*3/uL (ref 0.0–0.1)
Basophils Absolute: 0.1 10*3/uL (ref 0.0–0.1)
Basophils Relative: 1 %
EOS ABS: 0.2 10*3/uL (ref 0.0–0.7)
Eosinophils Relative: 3 %
HEMATOCRIT: 43.5 % (ref 39.0–52.0)
Hemoglobin: 13.7 g/dL (ref 13.0–17.0)
Immature Granulocytes: 1 %
LYMPHS ABS: 0.9 10*3/uL (ref 0.7–4.0)
Lymphocytes Relative: 10 %
MCH: 26.9 pg (ref 26.0–34.0)
MCHC: 31.5 g/dL (ref 30.0–36.0)
MCV: 85.3 fL (ref 78.0–100.0)
MONO ABS: 0.8 10*3/uL (ref 0.1–1.0)
MONOS PCT: 9 %
Neutro Abs: 6.5 10*3/uL (ref 1.7–7.7)
Neutrophils Relative %: 76 %
Platelets: 221 10*3/uL (ref 150–400)
RBC: 5.1 MIL/uL (ref 4.22–5.81)
RDW: 13.4 % (ref 11.5–15.5)
WBC: 8.6 10*3/uL (ref 4.0–10.5)

## 2018-05-24 LAB — BASIC METABOLIC PANEL
Anion gap: 12 (ref 5–15)
Anion gap: 14 (ref 5–15)
BUN: <5 mg/dL — ABNORMAL LOW (ref 6–20)
BUN: 11 mg/dL (ref 6–20)
CALCIUM: 9.1 mg/dL (ref 8.9–10.3)
CHLORIDE: 96 mmol/L — AB (ref 101–111)
CHLORIDE: 94 mmol/L — AB (ref 101–111)
CO2: 30 mmol/L (ref 22–32)
CO2: 25 mmol/L (ref 22–32)
CREATININE: 0.78 mg/dL (ref 0.61–1.24)
CREATININE: 0.92 mg/dL (ref 0.61–1.24)
Calcium: 9 mg/dL (ref 8.9–10.3)
GFR calc Af Amer: >60 mL/min (ref >60)
GFR calc Af Amer: >60 mL/min (ref >60)
GFR calc non Af Amer: >60 mL/min (ref >60)
GFR calc non Af Amer: >60 mL/min (ref >60)
Glucose, Bld: 140 mg/dL — ABNORMAL HIGH (ref 65–99)
Glucose, Bld: 203 mg/dL — ABNORMAL HIGH (ref 65–99)
Potassium: 3.2 mmol/L — ABNORMAL LOW (ref 3.5–5.1)
Potassium: 3.4 mmol/L — ABNORMAL LOW (ref 3.5–5.1)
SODIUM: 138 mmol/L (ref 135–145)
Sodium: 133 mmol/L — ABNORMAL LOW (ref 135–145)

## 2018-05-24 LAB — CBC
HCT: 42.5 % (ref 39.0–52.0)
Hemoglobin: 14 g/dL (ref 13.0–17.0)
MCH: 27.2 pg (ref 26.0–34.0)
MCHC: 32.9 g/dL (ref 30.0–36.0)
MCV: 82.5 fL (ref 78.0–100.0)
PLATELETS: 296 10*3/uL (ref 150–400)
RBC: 5.15 MIL/uL (ref 4.22–5.81)
RDW: 13.4 % (ref 11.5–15.5)
WBC: 18.6 10*3/uL — ABNORMAL HIGH (ref 4.0–10.5)

## 2018-05-24 LAB — I-STAT TROPONIN, ED: TROPONIN I, POC: 0 ng/mL (ref 0.00–0.08)

## 2018-05-24 MED ORDER — ALBUTEROL SULFATE (2.5 MG/3ML) 0.083% IN NEBU
5.0000 mg | INHALATION_SOLUTION | Freq: Once | RESPIRATORY_TRACT | Status: AC
  Administered 2018-05-24: 5 mg via RESPIRATORY_TRACT
  Filled 2018-05-24: qty 6

## 2018-05-24 NOTE — ED Provider Notes (Signed)
[facility] EMERGENCY DEPARTMENT Provider Note   CSN: [id number] Arrival date & time: 05/24/18  1017   History   Chief Complaint Chief Complaint  Patient presents with  . Shortness of Breath    HPI Stephen [name] is a 62 y.o. male.   The history is provided by the patient.   Shortness of Breath   This is a recurrent problem. The problem has been gradually worsening. Associated symptoms include cough, sputum production and wheezing. Pertinent negatives include no fever, no hemoptysis, no chest pain, no abdominal pain and no leg swelling. Associated medical issues include COPD.  Patient with a history of COPD, history of vocal cord cancer s/p Partial laryngectomy presents with shortness of breath.  He reports he never returned to baseline after recent admission for COPD.  Over the past day he has had increasing shortness of breath, therefore he called ambulance.  He requested to come to the hospital because he felt he was worsening.  In route to the hospital he was given nebulized treatments as well as Solu-Medrol, and he is now improved  Past Medical History:  Diagnosis Date  . Allergic rhinitis, cause unspecified   . Anxiety   . Blindness of left eye   . Complication of anesthesia    difficulty awaking after colonoscopy   . Depression   . Dyspnea   . GERD (gastroesophageal reflux disease)   . Hypothyroidism   . Lung cancer ([place])    lung ca dx 06, has had chemo and radiation  . Osteoporosis   . Other emphysema ([place])    copd  . Throat cancer ([place])    throat ca dx 2007  . Thrombosed hemorrhoids   . Tubular adenoma of colon 2009  . Unspecified vitamin D deficiency     Patient Active Problem List   Diagnosis Date Noted  . Hyperlipidemia 05/22/2018  . CAD (coronary artery disease)   . COPD with acute exacerbation ([place]) 05/05/2018  . Elevated troponin 05/04/2018  . Acute respiratory failure with hypoxia ([place]) 05/04/2018  . Malignant neoplasm of  glottis ([place]) 01/13/2018  . 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 Eosinophilia 05/30/2016  . History of seasonal allergies 05/30/2016  . Stopped smoking with greater than 40 pack year history 05/30/2016  . History of lobectomy of lung 05/30/2016  . COLD (chronic obstructive lung disease) ([place]) 07/27/2015  . History of elevated PSA 07/27/2015  . COPD exacerbation ([place]) 01/23/2015  . COPD, severe ([place]) 01/12/2015  . Patient in clinical research study 01/12/2015  . Research study patient 10/07/2014  . Need for prophylactic vaccination against Streptococcus pneumoniae (pneumococcus) 09/15/2014  . Chest pain 08/09/2013  . History of TIA (transient ischemic attack) 08/09/2013  . Otitis media of left ear 08/09/2013  . Bronchogenic cancer of right lung ([place]) 09/16/2012  . Hemorrhoids, external, thrombosed 08/26/2011  . BACK PAIN 02/22/2011  . COPD (chronic obstructive pulmonary disease) ([place]) 02/06/2011  . ROTATOR CUFF TEAR 02/15/2010  . ALLERGIC RHINITIS 04/15/2008    Past Surgical History:  Procedure Laterality Date  . COLONOSCOPY    . HERNIA REPAIR     as a baby- not sure where hernia was  . KNEE SURGERY Left 1972   .  has hardware  . LUNG LOBECTOMY     RUL  . MICROLARYNGOSCOPY WITH CO2 LASER AND EXCISION OF VOCAL CORD LESION N/A 12/19/2017   Procedure: MICROLARYNGOSCOPY WITH BIOPSY;  Surgeon: Melida Quitter, MD;  Location: [facility];  Service: ENT;  Laterality:  N/A;  . ROTATOR CUFF REPAIR Left   . SHOULDER SURGERY Right    rotator cuff  . THROAT SURGERY     Laser surgery for throat cancer  . TRANSTHORACIC ECHOCARDIOGRAM  01/13/2012   EF=>55%; trace MR/TR;         Home Medications    Prior to Admission medications   Medication Sig Start Date End Date Taking? Authorizing Provider  albuterol (PROVENTIL HFA;VENTOLIN HFA) 108 (90 Base) MCG/ACT inhaler Inhale 1-2 puffs into the lungs every 6 (six) hours as needed for wheezing or shortness of breath.     [provider]  albuterol (PROVENTIL) (2.5 MG/3ML) 0.083% nebulizer solution Take 2.5 mg by nebulization 3 (three) times daily as needed for wheezing or shortness of breath.     [provider]  aspirin 81 MG chewable tablet Chew 81 mg by mouth daily.    [provider]  benzonatate (TESSALON) 100 MG capsule Take 100 mg by mouth every 8 (eight) hours as needed for cough. 03/19/18   [provider]  Fluticasone-Umeclidin-Vilant (TRELEGY ELLIPTA) 100-62.5-25 MCG/INH AEPB Inhale 1 puff into the lungs daily. 08/13/17   Brand Males, MD  levothyroxine (SYNTHROID, LEVOTHROID) 125 MCG tablet Take 62.5 mcg by mouth daily before breakfast.     [provider]  metoprolol tartrate (LOPRESSOR) 50 MG tablet Take 1 tablet (50 mg total) by mouth once for 1 dose. 05/22/18 05/22/18  Richardson Dopp T, PA-C  mirtazapine (REMERON) 15 MG tablet Take 15 mg by mouth every evening. 04/15/18   [provider]  nitroGLYCERIN (NITROSTAT) 0.4 MG SL tablet Place 1 tablet (0.4 mg total) under the tongue every 5 (five) minutes as needed for chest pain. 08/09/13   Delfina Redwood, MD  omeprazole (PRILOSEC) 40 MG capsule Take 1 capsule (40 mg total) by mouth daily. 04/08/18   Ladene Artist, MD  pravastatin (PRAVACHOL) 40 MG tablet Take 40 mg by mouth at bedtime.     [provider]  predniSONE (DELTASONE) 10 MG tablet Take 4 tablets (40 mg) daily for 2 days, then, Take 3 tablets (30 mg) daily for 2 days, then, Take 2 tablets (20 mg) daily for 2 days, then, Take 1 tablets (10 mg) daily for 1 days, then stop 05/07/18   Ghimire, Henreitta Leber, MD  temazepam (RESTORIL) 30 MG capsule Take 30 mg by mouth at bedtime.     [provider]  Vitamin D, Ergocalciferol, (DRISDOL) 50000 UNITS CAPS Take 50,000 Units by mouth every 14 (fourteen) days.  05/07/11   [provider]    Family History Family History  Problem Relation Age of Onset  . Cancer Mother         Brain tumor  . COPD Mother   . Heart disease Father        CABG  . Prostate cancer Father   . Cystic fibrosis Sister        also heart failure  . Mental illness Brother   . Heart failure Paternal Grandmother   . Colon cancer Neg Hx   . Colon polyps Neg Hx   . Esophageal cancer Neg Hx     Social History Social History   Tobacco Use  . Smoking status: Former Smoker    Packs/day: 1.50    Years: 32.00    Pack years: 48.00    Types: Cigarettes    Last attempt to quit: 12/30/2004    Years since quitting: 13.4  . Smokeless tobacco: Never Used  .  Tobacco comment: 1 ppd x 30 years  Substance Use Topics  . Alcohol use: Yes    Comment: 1- 2 beers monthly  . Drug use: No     Allergies   Codeine; Cymbalta [duloxetine hcl]; and Montelukast sodium   Review of Systems Review of Systems  Constitutional: Negative for fever.  Respiratory: Positive for cough, sputum production, shortness of breath and wheezing. Negative for hemoptysis.   Cardiovascular: Negative for chest pain and leg swelling.  Gastrointestinal: Negative for abdominal pain.  All other systems reviewed and are negative.    Physical Exam Updated Vital Signs BP (!) 108/57   Pulse 99   Temp 97.6 F (36.4 C) (Oral)   Resp (!) 28   Ht 1.778 m (5\' 10" )   Wt 75.3 kg (166 lb)   SpO2 94%   BMI 23.82 kg/m    Physical Exam CONSTITUTIONAL: Well developed/well nourished, mildly anxious HEAD: Normocephalic/atraumatic EYES: EOMI/PERRL ENMT: Mucous membranes moist, brief stridor noted, uvula midline without edema or erythema NECK: supple no meningeal signs, well-healed trach scar SPINE/BACK:entire spine nontender CV: S1/S2 noted, no murmurs/rubs/gallops noted LUNGS: Referred upper airway sounds noted, scattered wheezing. ABDOMEN: soft, nontender NEURO: Pt is awake/alert/appropriate, moves all extremitiesx4.  No facial droop.   EXTREMITIES: pulses normal/equal, full ROM, no lower extremity edema SKIN: warm, color  normal PSYCH: mildly anxious  ED Treatments / Results  Labs (all labs ordered are listed, but only abnormal results are displayed) Labs Reviewed  BASIC METABOLIC PANEL - Abnormal; Notable for the following components:      Result Value   Potassium 3.2 (*)    Chloride 96 (*)    Glucose, Bld 140 (*)    BUN <5 (*)    All other components within normal limits  CBC WITH DIFFERENTIAL/PLATELET    EKG EKG Interpretation  Date/Time:  Sunday May 24 2018 04:32:23 EDT Ventricular Rate:  100 PR Interval:  176 QRS Duration: 70 QT Interval:  342 QTC Calculation: 441 R Axis:   44 Text Interpretation:  Normal sinus rhythm Right atrial enlargement Borderline ECG Interpretation limited secondary to artifact Confirmed by Ripley Fraise [id number]) on 05/24/2018 5:01:31 AM   Radiology Dg Chest 2 View  Result Date: 05/24/2018 CLINICAL DATA:  Shortness of breath since midnight. History of throat and lung cancer. Wheezing and stridor. EXAM: CHEST - 2 VIEW COMPARISON:  CT chest 05/04/2018.  Chest 05/04/2018. FINDINGS: Surgical clips in the right upper lung. Volume loss and scarring in the right upper lung likely relating to previous partial resection. Fibrosis and scarring in the right upper lung. Emphysematous changes in the lungs. No airspace disease or consolidation. No blunting of costophrenic angles. No pneumothorax. No change in appearance since previous study. Heart size and pulmonary vascularity are normal. Right rib changes likely resulting from previous thoracotomy. IMPRESSION: Postoperative changes with scarring and volume loss on the right lung. Emphysematous changes in the lungs. No change since prior study. No evidence of active pulmonary disease. Electronically Signed   By: Lucienne Capers M.D.   On: 05/24/2018 05:25    Procedures Procedures    Medications Ordered in ED Medications  albuterol (PROVENTIL) (2.5 MG/3ML) 0.083% nebulizer solution 5 mg (5 mg Nebulization Given 05/24/18 0517)       Initial Impression / Assessment and Plan / ED Course  I have reviewed the triage vital signs and the nursing notes.  Pertinent labs & imaging results that were available during my care of the patient were reviewed by me and considered  in my medical decision making (see chart for details).     5:35 AM Patient called EMS due to shortness of breath.  He is already improved on arrival.  He does have some stridor noted with talking.  He reports this is chronic ever since his surgery and has not worsened Repeat nebulizers ordered.  X-ray and labs are pending 6:23 AM Pt improved He feels at baseline He feels comfortable for d/c home He already has prednisone at home.  He will continue with albuterol every 4 hours.  He has PCP and pulmonology follow-up next week I offered patient further neb treatments and monitoring, but he feels ready for discharge. X-ray and labs reassuring/baseline Suspicion for PE is low.  No signs of CHF  Final Clinical Impressions(s) / ED Diagnoses   Final diagnoses:  COPD exacerbation [facility])    ED Discharge Orders    None      Ripley Fraise, MD 05/24/18 [phone]

## 2018-05-24 NOTE — ED Triage Notes (Addendum)
Pt to triage via [facility] for SOB/wheezing.  States he was seen in ED and discharged this morning for COPD exacerbation.  Pt received Albuterol/Atrovent Neb, Solumedrol 125 mg, and Magnesium 2 grams PTA by EMS.  Pt speaking in short phrases.  States he went outside for 5 min and had to come back in and start neb treatment at home and call EMS.

## 2018-05-24 NOTE — ED Notes (Signed)
Patient given the phone,

## 2018-05-24 NOTE — ED Notes (Signed)
EDP at bedside  

## 2018-05-24 NOTE — ED Notes (Signed)
Patient verbalizes understanding of discharge instructions. Opportunity for questioning and answers were provided. Armband removed by staff, pt discharged from ED.  

## 2018-05-24 NOTE — ED Triage Notes (Signed)
Patient presents to the ED from home with complaints of Shortness of breathe since midnight. Patient report history of throat and lung ca. Per EmS patient had wheezing and stridor on arrival gave 10mg  albuterol 0.5 atrovent. 125 solumedrol. Patient has 2 R hand,

## 2018-05-24 NOTE — ED Provider Notes (Signed)
[facility] EMERGENCY DEPARTMENT Provider Note   CSN: [id number] Arrival date & time: 05/24/18  1719     History   Chief Complaint Chief Complaint  Patient presents with  . Shortness of Breath    HPI Stephen [name] is a 62 y.o. male.  The history is provided by the patient.  Shortness of Breath  This is a recurrent problem. The average episode lasts 1 hour. The problem occurs frequently.The current episode started 1 to 2 hours ago. The problem has been rapidly improving. Pertinent negatives include no fever, no sore throat, no ear pain, no cough, no chest pain, no vomiting, no abdominal pain and no rash. The problem's precipitants include weather/humidity. He has tried inhaled steroids and beta-agonist inhalers for the symptoms. The treatment provided no relief. He has had prior hospitalizations. He has had prior ED visits. Associated medical issues include asthma and COPD.    Past Medical History:  Diagnosis Date  . Allergic rhinitis, cause unspecified   . Anxiety   . Blindness of left eye   . Complication of anesthesia    difficulty awaking after colonoscopy   . Depression   . Dyspnea   . GERD (gastroesophageal reflux disease)   . Hypothyroidism   . Lung cancer ([place])    lung ca dx 06, has had chemo and radiation  . Osteoporosis   . Other emphysema ([place])    copd  . Throat cancer ([place])    throat ca dx 2007  . Thrombosed hemorrhoids   . Tubular adenoma of colon 2009  . Unspecified vitamin D deficiency     Patient Active Problem List   Diagnosis Date Noted  . Hyperlipidemia 05/22/2018  . CAD (coronary artery disease)   . COPD with acute exacerbation ([place]) 05/05/2018  . Elevated troponin 05/04/2018  . Acute respiratory failure with hypoxia ([place]) 05/04/2018  . 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 Alpha-1-antitrypsin deficiency carrier ([place]) 07/17/2016  . Eosinophilia 05/30/2016  . History of seasonal allergies 05/30/2016  .  Stopped smoking with greater than 40 pack year history 05/30/2016  . History of lobectomy of lung 05/30/2016  . COLD (chronic obstructive lung disease) ([place]) 07/27/2015  . History of elevated PSA 07/27/2015  . COPD exacerbation ([place]) 01/23/2015  . COPD, severe ([place]) 01/12/2015  . Patient in clinical research study 01/12/2015  . Research study patient 10/07/2014  . Need for prophylactic vaccination against Streptococcus pneumoniae (pneumococcus) 09/15/2014  . Chest pain 08/09/2013  . History of TIA (transient ischemic attack) 08/09/2013  . Otitis media of left ear 08/09/2013  . Bronchogenic cancer of right lung ([place]) 09/16/2012  . Hemorrhoids, external, thrombosed 08/26/2011  . BACK PAIN 02/22/2011  . COPD (chronic obstructive pulmonary disease) ([place]) 02/06/2011  . ROTATOR CUFF TEAR 02/15/2010  . ALLERGIC RHINITIS 04/15/2008    Past Surgical History:  Procedure Laterality Date  . COLONOSCOPY    . HERNIA REPAIR     as a baby- not sure where hernia was  . KNEE SURGERY Left 1972   .  has hardware  . LUNG LOBECTOMY     RUL  . MICROLARYNGOSCOPY WITH CO2 LASER AND EXCISION OF VOCAL CORD LESION N/A 12/19/2017   Procedure: MICROLARYNGOSCOPY WITH BIOPSY;  Surgeon: Melida Quitter, MD;  Location: [facility];  Service: ENT;  Laterality: N/A;  . ROTATOR CUFF REPAIR Left   . SHOULDER SURGERY Right    rotator cuff  . THROAT SURGERY     Laser surgery for throat cancer  .  TRANSTHORACIC ECHOCARDIOGRAM  01/13/2012   EF=>55%; trace MR/TR;         Home Medications    Prior to Admission medications   Medication Sig Start Date End Date Taking? Authorizing Provider  albuterol (PROVENTIL HFA;VENTOLIN HFA) 108 (90 Base) MCG/ACT inhaler Inhale 1-2 puffs into the lungs every 6 (six) hours as needed for wheezing or shortness of breath.   Yes [provider]  albuterol (PROVENTIL) (2.5 MG/3ML) 0.083% nebulizer solution Take 2.5 mg by nebulization 3 (three) times daily as needed for wheezing or  shortness of breath.    Yes [provider]  aspirin 81 MG chewable tablet Chew 81 mg by mouth daily.   Yes [provider]  benzonatate (TESSALON) 100 MG capsule Take 100 mg by mouth every 8 (eight) hours as needed for cough. 03/19/18  Yes [provider]  Fluticasone-Umeclidin-Vilant (TRELEGY ELLIPTA) 100-62.5-25 MCG/INH AEPB Inhale 1 puff into the lungs daily. 08/13/17  Yes Brand Males, MD  levothyroxine (SYNTHROID, LEVOTHROID) 125 MCG tablet Take 62.5 mcg by mouth daily before breakfast.    Yes [provider]  mirtazapine (REMERON) 15 MG tablet Take 15 mg by mouth every evening. 04/15/18  Yes [provider]  nitroGLYCERIN (NITROSTAT) 0.4 MG SL tablet Place 1 tablet (0.4 mg total) under the tongue every 5 (five) minutes as needed for chest pain. 08/09/13  Yes Delfina [place]wood, MD  omeprazole (PRILOSEC) 40 MG capsule Take 1 capsule (40 mg total) by mouth daily. 04/08/18  Yes Ladene Artist, MD  pravastatin (PRAVACHOL) 40 MG tablet Take 40 mg by mouth at bedtime.    Yes [provider]  predniSONE (DELTASONE) 10 MG tablet Take 4 tablets (40 mg) daily for 2 days, then, Take 3 tablets (30 mg) daily for 2 days, then, Take 2 tablets (20 mg) daily for 2 days, then, Take 1 tablets (10 mg) daily for 1 days, then stop 05/07/18  Yes Ghimire, Henreitta Leber, MD  temazepam (RESTORIL) 30 MG capsule Take 30 mg by mouth at bedtime.    Yes [provider]  Vitamin D, Ergocalciferol, (DRISDOL) 50000 UNITS CAPS Take 50,000 Units by mouth every 14 (fourteen) days.  05/07/11  Yes [provider]  metoprolol tartrate (LOPRESSOR) 50 MG tablet Take 1 tablet (50 mg total) by mouth once for 1 dose. 05/22/18 05/22/18  Liliane Shi, PA-C    Family History Family History  Problem Relation Age of Onset  . Cancer Mother        Brain tumor  . COPD Mother   . Heart disease Father        CABG  . Prostate cancer Father   . Cystic fibrosis Sister         also heart failure  . Mental illness Brother   . Heart failure Paternal Grandmother   . Colon cancer Neg Hx   . Colon polyps Neg Hx   . Esophageal cancer Neg Hx     Social History Social History   Tobacco Use  . Smoking status: Former Smoker    Packs/day: 1.50    Years: 32.00    Pack years: 48.00    Types: Cigarettes    Last attempt to quit: 12/30/2004    Years since quitting: 13.4  . Smokeless tobacco: Never Used  . Tobacco comment: 1 ppd x 30 years  Substance Use Topics  . Alcohol use: Yes    Comment: 1- 2 beers monthly  . Drug use: No  Allergies   Codeine; Cymbalta [duloxetine hcl]; and Montelukast sodium   Review of Systems Review of Systems  Constitutional: Negative for chills and fever.  HENT: Negative for ear pain and sore throat.   Eyes: Negative for pain and visual disturbance.  Respiratory: Positive for shortness of breath. Negative for cough.   Cardiovascular: Negative for chest pain and palpitations.  Gastrointestinal: Negative for abdominal pain and vomiting.  Genitourinary: Negative for dysuria and hematuria.  Musculoskeletal: Negative for arthralgias and back pain.  Skin: Negative for color change and rash.  Neurological: Negative for seizures and syncope.  All other systems reviewed and are negative.    Physical Exam Updated Vital Signs BP 128/90   Pulse (!) 110   Temp 98.1 F (36.7 C) (Oral)   Resp (!) 24   Ht 5\' 10"  (1.778 m)   Wt 75.3 kg (166 lb)   SpO2 95%   BMI 23.82 kg/m   Physical Exam  Constitutional: He appears well-developed and well-nourished.  HENT:  Head: Normocephalic and atraumatic.  Eyes: Conjunctivae are normal.  Neck: Neck supple.  Cardiovascular: Normal rate and regular rhythm.  No murmur heard. Pulmonary/Chest: Stridor present. No respiratory distress. He has wheezes. He has no rales.  Pt has strong inspiratory effort and stridor which is chronic since his throat surgery. He has mild exp wheezing. Able to  speak in full sentences  Abdominal: Soft. There is no tenderness.  Musculoskeletal: He exhibits no edema.  Neurological: He is alert.  Skin: Skin is warm and dry.  Psychiatric: He has a normal mood and affect.  Nursing note and vitals reviewed.    ED Treatments / Results  Labs (all labs ordered are listed, but only abnormal results are displayed) Labs Reviewed  BASIC METABOLIC PANEL - Abnormal; Notable for the following components:      Result Value   Sodium 133 (*)    Potassium 3.4 (*)    Chloride 94 (*)    Glucose, Bld 203 (*)    All other components within normal limits  CBC - Abnormal; Notable for the following components:   WBC 18.6 (*)    All other components within normal limits  I-STAT TROPONIN, ED    EKG EKG Interpretation  Date/Time:  Sunday May 24 2018 17:26:11 EDT Ventricular Rate:  111 PR Interval:  158 QRS Duration: 96 QT Interval:  346 QTC Calculation: 470 R Axis:   51 Text Interpretation:  Sinus tachycardia Biatrial enlargement Anterior infarct , age undetermined Abnormal ECG Confirmed by Zenovia Jarred [phone]) on 05/24/2018 5:44:08 PM   Radiology Dg Chest 2 View  Result Date: 05/24/2018 CLINICAL DATA:  Shortness of breath since midnight. History of throat and lung cancer. Wheezing and stridor. EXAM: CHEST - 2 VIEW COMPARISON:  CT chest 05/04/2018.  Chest 05/04/2018. FINDINGS: Surgical clips in the right upper lung. Volume loss and scarring in the right upper lung likely relating to previous partial resection. Fibrosis and scarring in the right upper lung. Emphysematous changes in the lungs. No airspace disease or consolidation. No blunting of costophrenic angles. No pneumothorax. No change in appearance since previous study. Heart size and pulmonary vascularity are normal. Right rib changes likely resulting from previous thoracotomy. IMPRESSION: Postoperative changes with scarring and volume loss on the right lung. Emphysematous changes in the lungs. No  change since prior study. No evidence of active pulmonary disease. Electronically Signed   By: Lucienne Capers M.D.   On: 05/24/2018 05:25    Procedures Procedures (including critical care  time)  Medications Ordered in ED Medications  albuterol (PROVENTIL) (2.5 MG/3ML) 0.083% nebulizer solution 5 mg (5 mg Nebulization Given 05/24/18 1839)     Initial Impression / Assessment and Plan / ED Course  I have reviewed the triage vital signs and the nursing notes.  Pertinent labs & imaging results that were available during my care of the patient were reviewed by me and considered in my medical decision making (see chart for details).    Patient is a 62 year old male with history as above who presents with recurrent shortness of breath.  He was here earlier this morning and discharged as a COPD exacerbation.  He reports that he went outside this afternoon in the heat and developed acute shortness of breath.  He then became panicked.  His wife called EMS.  EMS gave him some duo nebs which he said resolved his shortness of breath.  They also offered him magnesium but required that he come to the hospital.  Patient states that he nails feels better and would like to go home.  He does have increased work of breathing with his inspiratory effort however this is chronic.  He is oxygenating well.  He is able to carry on full conversation with me.  He already has steroids at home he has just not started taking them yet.  He is afebrile.  He was given 2 additional breathing treatments here.  He continues to report feeling better and would like to go home.  He was offered admission for COPD exacerbation which he declined.  Patient discharged in stable condition.  Final Clinical Impressions(s) / ED Diagnoses   Final diagnoses:  COPD exacerbation [facility])    ED Discharge Orders    None       Clifton James, MD 05/24/18 Darci Needle    Varney Biles, MD 05/25/18 0998

## 2018-05-24 NOTE — Discharge Instructions (Addendum)
-   Take the course of steroids you were already prescribed

## 2018-05-27 ENCOUNTER — Encounter: Payer: Self-pay | Admitting: Internal Medicine

## 2018-05-27 ENCOUNTER — Ambulatory Visit: Payer: 59 | Admitting: Internal Medicine

## 2018-05-27 VITALS — BP 128/80 | HR 94 | Ht 71.0 in | Wt 164.0 lb

## 2018-05-27 DIAGNOSIS — J449 Chronic obstructive pulmonary disease, unspecified: Secondary | ICD-10-CM

## 2018-05-27 MED ORDER — FLUTICASONE-UMECLIDIN-VILANT 100-62.5-25 MCG/INH IN AEPB: 1.0000 | INHALATION_SPRAY | Freq: Every day | RESPIRATORY_TRACT | 0 refills | Status: DC

## 2018-05-27 NOTE — Patient Instructions (Signed)
ICD-10-CM   1. Stage 3 severe COPD by GOLD classification ([place]) J44.9     Improved from recent flare ups  Plan Continue trelegy scheduled as before with albuterol as needed   followup4-12 weeks - cat score at followup

## 2018-05-27 NOTE — Progress Notes (Signed)
Subjective:     Patient ID: Stephen [name], male   DOB: 1956-12-27, 62 y.o.   MRN: [id number]  HPI     OV 01/27/2017  Chief Complaint  Patient presents with  . Follow-up    Breathing is overall doing well. No new co's today. He     Follow-up moderate to severe COPD with a history of recurrent exacerbations and MZ phenotype  Since his last visit he continues to be stable. There are no new exacerbations. He did see Dr. Julien Nordmann oncologist 12/09/2016. I reviewed that note and summarized the fact that he has no recurrence and a follow-up CT chest is planned for June 2018. Most recent CT chest is from June 2017 and this is reviewed below. I did not visualize this film. For his recurrent exacerbations we did discuss alpha-1 replacement but we opted to take Daliresp. However he tells me that he is mostly noncompliant with it. He is willing to retry this. He is up-to-date with his flu shot.  CT chest June 2017: IMPRESSION: 1. No significant change in treatment effects within the right hemi thorax, including right upper lobectomy and presumed radiation induced consolidation at the right apex. 2. centrilobular emphysema, without other explanation for patient's symptoms. 3. Similar right middle lobe pulmonary nodule, favoring a benign etiology. 4. Cholelithiasis.   Electronically Signed   By: Abigail Miyamoto M.D.   On: 06/04/2016 09:33   OV 07/25/2017  Chief Complaint  Patient presents with  . Follow-up    pt c/o "URI" since 03/19/17.  pt c/o sob with exertion, prod cough with green/yellow mucus, fatigue.     Moderate COPD with recurrent exacerbation history MZ phenotype in the setting of lung cancer status post lobectomy  June 2018 he did have a CT scan that showed continued remission. Apparently been discharged from oncology follow-up. However for the last few months he's having recurrent exacerbations associated with sinusitis. He feels roflumilast did not help him. He is  compliant with the Spiriva and Symbicort. He is frustrated by his recurrent exacerbations. He feels it is a sinus causing these issues. Last CT scan of the sinus was in 2008. He says that when he was in allergy shots with Dr. Donneta Romberg symptoms were better. When he came off the allergy shots to go on the [place] which actually didn't help him. He was better while he was in the trial and is interested int rying Trelegy  Noted: When he walked out of the office he saw RN Alethia Berthold and made comments, "hey married girl are you stil married?". I advised him to mind his language and maintain decorum. He replied, "Ok but then aadded "these days we cannot peek and play". This is not the first time he has made comments of this nature on this RN. In fact is for this very reason he was largely worked up by another [place] 05/27/2018  Chief Complaint  Patient presents with  . Follow-up    follow up for respiratory failure, wheezing is getting better.     Moderate/borderline severe COPD with recurrent exacerbations MZ phenotype.  In the setting of lung cancer status post lobectomy and also remote throat cancer  Last seen July 2018.  Since then there has been significant changes to his health status.  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  Apparently it was significant enough that resection here in [place] by  Dr. Redmond Baseman had to be abandoned and he had to undergo hospitalization and resection and status post tracheostomy at [facility].  He is extremely upset with his healthcare providers including me for missing this diagnosis.  Quick review of the chart on his visits indicate that he has never expressed any recurrence of his hoarseness of voice with me.  He most recently had a lung cancer surveillance scan May 04, 2018 that shows lung cancer to be in remission.  He tells me Dr. Julien Nordmann has discharged him from follow-up.   He takes Trelegy inhaler for his COPD.  He has had multiple exacerbations and this is all documented in the medical record.  Including admissions requiring BiPAP.  It appears he might not be on any nocturnal oxygen.  At this point in time he feels good and his COPD CAT score is 14; low scores he has had.  He is not interested in retesting for allergies.   CAT COPD Symptom & Quality of Life Score (GSK trademark) 0 is no burden. 5 is highest burden 2015 07/25/2017  05/27/2018   Never Cough -> Cough all the time  4 2  No phlegm in chest -> Chest is full of phlegm  3 0  No chest tightness -> Chest feels very tight  3 0  No dyspnea for 1 flight stairs/hill -> Very dyspneic for 1 flight of stairs  4 2  No limitations for ADL at home -> Very limited with ADL at home  5 5  Confident leaving home -> Not at all confident leaving home  0 1  Sleep soundly -> Do not sleep soundly because of lung condition  3 0  Lots of Energy -> No energy at all  4 4  TOTAL Score (max 40)  26 26 14       has a past medical history of Allergic rhinitis, cause unspecified, Anxiety, Blindness of left eye, Complication of anesthesia, Depression, Dyspnea, GERD (gastroesophageal reflux disease), Hypothyroidism, Lung cancer ([place]), Osteoporosis, Other emphysema ([place]), Throat cancer ([place]), Thrombosed hemorrhoids, Tubular adenoma of colon (2009), and Unspecified vitamin D deficiency.   reports that he quit smoking about 13 years ago. His smoking use included cigarettes. He has a 48.00 pack-year smoking history. He has never used smokeless tobacco.  Past Surgical History:  Procedure Laterality Date  . COLONOSCOPY    . HERNIA REPAIR     as a baby- not sure where hernia was  . KNEE SURGERY Left 1972   .  has hardware  . LUNG LOBECTOMY     RUL  . MICROLARYNGOSCOPY WITH CO2 LASER AND EXCISION OF VOCAL CORD LESION N/A 12/19/2017   Procedure: MICROLARYNGOSCOPY WITH BIOPSY;  Surgeon: Melida Quitter, MD;  Location: [facility];  Service:  ENT;  Laterality: N/A;  . ROTATOR CUFF REPAIR Left   . SHOULDER SURGERY Right    rotator cuff  . THROAT SURGERY     Laser surgery for throat cancer  . TRANSTHORACIC ECHOCARDIOGRAM  01/13/2012   EF=>55%; trace MR/TR;     Allergies  Allergen Reactions  . Codeine Other (See Comments)    Unknown. Mother told him he was allergic  . Cymbalta [Duloxetine Hcl] Nausea And Vomiting  . Montelukast Sodium Other (See Comments)    Causes sinusitis    Immunization History  Administered Date(s) Administered  . Influenza Split 08/30/2013, 09/08/2014, 09/06/2015  . Influenza Whole 10/01/2010, 10/02/2011, 09/29/2016  . Pneumococcal Conjugate-13 09/08/2014  . Pneumococcal Polysaccharide-23 10/30/2009  . Zoster 09/09/2016  Family History  Problem Relation Age of Onset  . Cancer Mother        Brain tumor  . COPD Mother   . Heart disease Father        CABG  . Prostate cancer Father   . Cystic fibrosis Sister        also heart failure  . Mental illness Brother   . Heart failure Paternal Grandmother   . Colon cancer Neg Hx   . Colon polyps Neg Hx   . Esophageal cancer Neg Hx      Current Outpatient Medications:  .  albuterol (PROVENTIL HFA;VENTOLIN HFA) 108 (90 Base) MCG/ACT inhaler, Inhale 1-2 puffs into the lungs every 6 (six) hours as needed for wheezing or shortness of breath., Disp: , Rfl:  .  albuterol (PROVENTIL) (2.5 MG/3ML) 0.083% nebulizer solution, Take 2.5 mg by nebulization 3 (three) times daily as needed for wheezing or shortness of breath. , Disp: , Rfl:  .  aspirin 81 MG chewable tablet, Chew 81 mg by mouth daily., Disp: , Rfl:  .  benzonatate (TESSALON) 100 MG capsule, Take 100 mg by mouth every 8 (eight) hours as needed for cough., Disp: , Rfl: 4 .  Fluticasone-Umeclidin-Vilant (TRELEGY ELLIPTA) 100-62.5-25 MCG/INH AEPB, Inhale 1 puff into the lungs daily., Disp: 60 each, Rfl: 2 .  levothyroxine (SYNTHROID, LEVOTHROID) 125 MCG tablet, Take 62.5 mcg by mouth daily before  breakfast. , Disp: , Rfl:  .  mirtazapine (REMERON) 15 MG tablet, Take 15 mg by mouth every evening., Disp: , Rfl: 11 .  nitroGLYCERIN (NITROSTAT) 0.4 MG SL tablet, Place 1 tablet (0.4 mg total) under the tongue every 5 (five) minutes as needed for chest pain., Disp: 20 tablet, Rfl: 0 .  omeprazole (PRILOSEC) 40 MG capsule, Take 1 capsule (40 mg total) by mouth daily., Disp: 30 capsule, Rfl: 11 .  pravastatin (PRAVACHOL) 40 MG tablet, Take 40 mg by mouth at bedtime. , Disp: , Rfl:  .  predniSONE (DELTASONE) 10 MG tablet, Take 4 tablets (40 mg) daily for 2 days, then, Take 3 tablets (30 mg) daily for 2 days, then, Take 2 tablets (20 mg) daily for 2 days, then, Take 1 tablets (10 mg) daily for 1 days, then stop, Disp: 19 tablet, Rfl: 0 .  temazepam (RESTORIL) 30 MG capsule, Take 30 mg by mouth at bedtime. , Disp: , Rfl:  .  Vitamin D, Ergocalciferol, (DRISDOL) 50000 UNITS CAPS, Take 50,000 Units by mouth every 14 (fourteen) days. , Disp: , Rfl:  .  metoprolol tartrate (LOPRESSOR) 50 MG tablet, Take 1 tablet (50 mg total) by mouth once for 1 dose., Disp: 1 tablet, Rfl: 0   Review of Systems     Objective:   Physical Exam  Constitutional: He is oriented to person, place, and time. He appears well-developed and well-nourished. No distress.  Has lost a lot of weight  HENT:  Head: Normocephalic and atraumatic.  Right Ear: External ear normal.  Left Ear: External ear normal.  Mouth/Throat: Oropharynx is clear and moist. No oropharyngeal exudate.  Extreme hoarse voice Tracheostomy scar  Eyes: Pupils are equal, round, and reactive to light. Conjunctivae and EOM are normal. Right eye exhibits no discharge. Left eye exhibits no discharge. No scleral icterus.  Neck: Normal range of motion. Neck supple. No JVD present. No tracheal deviation present. No thyromegaly present.  Cardiovascular: Normal rate, regular rhythm and intact distal pulses. Exam reveals no gallop and no friction rub.  No murmur  heard.  Pulmonary/Chest: Effort normal and breath sounds normal. No respiratory distress. He has no wheezes. He has no rales. He exhibits no tenderness.  Mild labored breathing as he talks because of vocal cord issues but lungs are clear  Abdominal: Soft. Bowel sounds are normal. He exhibits no distension and no mass. There is no tenderness. There is no rebound and no guarding.  Musculoskeletal: Normal range of motion. He exhibits no edema or tenderness.  Lymphadenopathy:    He has no cervical adenopathy.  Neurological: He is alert and oriented to person, place, and time. He has normal reflexes. No cranial nerve deficit. Coordination normal.  Skin: Skin is warm and dry. No rash noted. He is not diaphoretic. No erythema. No pallor.  Psychiatric: He has a normal mood and affect. His behavior is normal. Judgment and thought content normal.  Nursing note and vitals reviewed.  Today's Vitals   05/27/18 1152  BP: 128/80  Pulse: 94  SpO2: 100%  Weight: 164 lb (74.4 kg)  Height: 5\' 11"  (1.803 m)    Estimated body mass index is 22.87 kg/m as calculated from the following:   Height as of this encounter: 5\' 11"  (1.803 m).   Weight as of this encounter: 164 lb (74.4 kg).      Assessment:       ICD-10-CM   1. Stage 3 severe COPD by GOLD classification ([place]) J44.9        Plan:       Improved from recent flare ups  Plan Continue trelegy scheduled as before with albuterol as needed   followup4-12 weeks - cat score at followup   Dr. Brand Males, M.D., [facility].C.P Pulmonary and Critical Care Medicine Staff Physician, [place] Director - Interstitial Lung Disease  Program  Pulmonary [place] at [place], [place], [address]  Pager: [phone], If no answer or between  15:00h - 7:00h: call [phone] Telephone: [phone]

## 2018-06-07 ENCOUNTER — Emergency Department (HOSPITAL_COMMUNITY)
Admission: EM | Admit: 2018-06-07 | Discharge: 2018-06-07 | Disposition: A | Discharge status: Home / Self Care | Payer: Medicare Other | Attending: Emergency Medicine | Admitting: Emergency Medicine

## 2018-06-07 ENCOUNTER — Encounter (HOSPITAL_COMMUNITY): Payer: Self-pay | Admitting: Emergency Medicine

## 2018-06-07 DIAGNOSIS — Z7982 Long term (current) use of aspirin: Secondary | ICD-10-CM | POA: Diagnosis not present

## 2018-06-07 DIAGNOSIS — J441 Chronic obstructive pulmonary disease with (acute) exacerbation: Secondary | ICD-10-CM | POA: Insufficient documentation

## 2018-06-07 DIAGNOSIS — R0602 Shortness of breath: Secondary | ICD-10-CM | POA: Diagnosis present

## 2018-06-07 DIAGNOSIS — E039 Hypothyroidism, unspecified: Secondary | ICD-10-CM | POA: Insufficient documentation

## 2018-06-07 DIAGNOSIS — Z87891 Personal history of nicotine dependence: Secondary | ICD-10-CM | POA: Diagnosis not present

## 2018-06-07 DIAGNOSIS — I2510 Atherosclerotic heart disease of native coronary artery without angina pectoris: Secondary | ICD-10-CM | POA: Insufficient documentation

## 2018-06-07 DIAGNOSIS — Z79899 Other long term (current) drug therapy: Secondary | ICD-10-CM | POA: Insufficient documentation

## 2018-06-07 MED ORDER — PREDNISONE 20 MG PO TABS: ORAL_TABLET | ORAL | 0 refills | Status: DC

## 2018-06-07 NOTE — ED Notes (Signed)
Patient was on room air, now on 2 lit [place] because his O2 dropped 88%. Will continue to re-assess.

## 2018-06-07 NOTE — ED Notes (Signed)
D/c reviewed with patient and significant order

## 2018-06-07 NOTE — ED Triage Notes (Addendum)
Patient arrived from Home with EMS. EMS was at patient's residence at 2am, administered breathing treatment, patient "felt better and did not want to go to the hospital" He called again this morning and was transported here. Reports being short of breath X 24hours.  He uses home oxygen at bedtime =2lit [facility] administered: Albuterol 5mg  X 2 Atrovent 0.5mg  Solumedrol 125mg  Mag 2mg 

## 2018-06-07 NOTE — ED Notes (Signed)
Ambulated pt in the hallway, per Dr  Winfred Leeds. Pt's O2 was 93%-95%. Informed Dr. Winfred Leeds.

## 2018-06-07 NOTE — ED Provider Notes (Signed)
[facility] EMERGENCY DEPARTMENT Provider Note   CSN: [id number] Arrival date & time: 06/07/18  7253     History   Chief Complaint Chief Complaint  Patient presents with  . Shortness of Breath    HPI Stephen [name] is a 62 y.o. male.  HPI She reports she awakened this morning "in a panic, "with difficulty breathing.  Feeling as if he were having a COPD exacerbation.  He denies cough denies fever.  He uses last dose of prednisone yesterday.  EMS was called.  EMS treated patient with 2 albuterol nebulized treatments, Atrovent nebulized treatment Solu-Medrol 125 mg IV and magnesium 2 g IV.  His breathing is now normal and he feels at baseline.  Denies chest pain denies cough denies fever.  No other associated symptoms Past Medical History:  Diagnosis Date  . Allergic rhinitis, cause unspecified   . Anxiety   . Blindness of left eye   . Complication of anesthesia    difficulty awaking after colonoscopy   . Depression   . Dyspnea   . GERD (gastroesophageal reflux disease)   . Hypothyroidism   . Lung cancer ([place])    lung ca dx 06, has had chemo and radiation  . Osteoporosis   . Other emphysema ([place])    copd  . Throat cancer ([place])    throat ca dx 2007  . Thrombosed hemorrhoids   . Tubular adenoma of colon 2009  . Unspecified vitamin D deficiency     Patient Active Problem List   Diagnosis Date Noted  . Hyperlipidemia 05/22/2018  . CAD (coronary artery disease)   . COPD with acute exacerbation ([place]) 05/05/2018  . Elevated troponin 05/04/2018  . Acute respiratory failure with hypoxia ([place]) 05/04/2018  . 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 Alpha-1-antitrypsin deficiency carrier ([place]) 07/17/2016  . Eosinophilia 05/30/2016  . History of seasonal allergies 05/30/2016  . Stopped smoking with greater than 40 pack year history 05/30/2016  . History of lobectomy of lung 05/30/2016  . COLD (chronic obstructive lung disease) ([place]) 07/27/2015    . History of elevated PSA 07/27/2015  . COPD exacerbation ([place]) 01/23/2015  . COPD, severe ([place]) 01/12/2015  . Research study patient 10/07/2014  . Need for prophylactic vaccination against Streptococcus pneumoniae (pneumococcus) 09/15/2014  . Chest pain 08/09/2013  . History of TIA (transient ischemic attack) 08/09/2013  . Otitis media of left ear 08/09/2013  . 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 Hemorrhoids, external, thrombosed 08/26/2011  . BACK PAIN 02/22/2011  . COPD (chronic obstructive pulmonary disease) ([place]) 02/06/2011  . ROTATOR CUFF TEAR 02/15/2010  . ALLERGIC RHINITIS 04/15/2008    Past Surgical History:  Procedure Laterality Date  . COLONOSCOPY    . HERNIA REPAIR     as a baby- not sure where hernia was  . KNEE SURGERY Left 1972   .  has hardware  . LUNG LOBECTOMY     RUL  . MICROLARYNGOSCOPY WITH CO2 LASER AND EXCISION OF VOCAL CORD LESION N/A 12/19/2017   Procedure: MICROLARYNGOSCOPY WITH BIOPSY;  Surgeon: Melida Quitter, MD;  Location: [facility];  Service: ENT;  Laterality: N/A;  . ROTATOR CUFF REPAIR Left   . SHOULDER SURGERY Right    rotator cuff  . THROAT SURGERY     Laser surgery for throat cancer  . TRANSTHORACIC ECHOCARDIOGRAM  01/13/2012   EF=>55%; trace MR/TR;      Patient denies blindness in left eye  Home Medications  Prior to Admission medications   Medication Sig Start Date End Date Taking? Authorizing Provider  albuterol (PROVENTIL HFA;VENTOLIN HFA) 108 (90 Base) MCG/ACT inhaler Inhale 1-2 puffs into the lungs every 6 (six) hours as needed for wheezing or shortness of breath.    [provider]  albuterol (PROVENTIL) (2.5 MG/3ML) 0.083% nebulizer solution Take 2.5 mg by nebulization 3 (three) times daily as needed for wheezing or shortness of breath.     [provider]  aspirin 81 MG chewable tablet Chew 81 mg by mouth daily.    [provider]  benzonatate (TESSALON) 100 MG capsule Take 100 mg by  mouth every 8 (eight) hours as needed for cough. 03/19/18   [provider]  Fluticasone-Umeclidin-Vilant (TRELEGY ELLIPTA) 100-62.5-25 MCG/INH AEPB Inhale 1 puff into the lungs daily. 08/13/17   Brand Males, MD  Fluticasone-Umeclidin-Vilant (TRELEGY ELLIPTA) 100-62.5-25 MCG/INH AEPB Inhale 1 puff into the lungs daily. 05/27/18   Brand Males, MD  levothyroxine (SYNTHROID, LEVOTHROID) 125 MCG tablet Take 62.5 mcg by mouth daily before breakfast.     [provider]  metoprolol tartrate (LOPRESSOR) 50 MG tablet Take 1 tablet (50 mg total) by mouth once for 1 dose. 05/22/18 05/22/18  Richardson Dopp T, PA-C  mirtazapine (REMERON) 15 MG tablet Take 15 mg by mouth every evening. 04/15/18   [provider]  nitroGLYCERIN (NITROSTAT) 0.4 MG SL tablet Place 1 tablet (0.4 mg total) under the tongue every 5 (five) minutes as needed for chest pain. 08/09/13   Delfina Redwood, MD  omeprazole (PRILOSEC) 40 MG capsule Take 1 capsule (40 mg total) by mouth daily. 04/08/18   Ladene Artist, MD  pravastatin (PRAVACHOL) 40 MG tablet Take 40 mg by mouth at bedtime.     [provider]  predniSONE (DELTASONE) 10 MG tablet Take 4 tablets (40 mg) daily for 2 days, then, Take 3 tablets (30 mg) daily for 2 days, then, Take 2 tablets (20 mg) daily for 2 days, then, Take 1 tablets (10 mg) daily for 1 days, then stop 05/07/18   Ghimire, Henreitta Leber, MD  temazepam (RESTORIL) 30 MG capsule Take 30 mg by mouth at bedtime.     [provider]  Vitamin D, Ergocalciferol, (DRISDOL) 50000 UNITS CAPS Take 50,000 Units by mouth every 14 (fourteen) days.  05/07/11   [provider]   Oxygen 2 L at night Family History Family History  Problem Relation Age of Onset  . Cancer Mother        Brain tumor  . COPD Mother   . Heart disease Father        CABG  . Prostate cancer Father   . Cystic fibrosis Sister        also heart failure  . Mental illness Brother   . Heart  failure Paternal Grandmother   . Colon cancer Neg Hx   . Colon polyps Neg Hx   . Esophageal cancer Neg Hx     Social History Social History   Tobacco Use  . Smoking status: Former Smoker    Packs/day: 1.50    Years: 32.00    Pack years: 48.00    Types: Cigarettes    Last attempt to quit: 12/30/2004    Years since quitting: 13.4  . Smokeless tobacco: Never Used  . Tobacco comment: 1 ppd x 30 years  Substance Use Topics  . Alcohol use: Yes    Comment: 1- 2 beers monthly  . Drug use: No  Allergies   Codeine; Cymbalta [duloxetine hcl]; and Montelukast sodium   Review of Systems Review of Systems  Constitutional: Negative.   HENT: Negative.   Respiratory: Positive for shortness of breath.   Cardiovascular: Negative.   Gastrointestinal: Negative.   Musculoskeletal: Negative.   Skin: Negative.   Neurological: Negative.   Psychiatric/Behavioral: Negative.   All other systems reviewed and are negative.    Physical Exam Updated Vital Signs BP 117/69   Pulse 89   Temp 97.6 F (36.4 C) (Oral)   Resp (!) 29   SpO2 92%   Physical Exam  Constitutional: No distress.  [place] ill-appearing no respiratory distress  HENT:  Head: Normocephalic and atraumatic.  Eyes: Pupils are equal, round, and reactive to light. Conjunctivae are normal.  Neck: Neck supple. No tracheal deviation present. No thyromegaly present.  Cardiovascular: Normal rate and regular rhythm.  No murmur heard. Pulmonary/Chest: Effort normal.  Speaks in paragraphs, no respiratory distress.  Diffuse scant rhonchi.  Respiratory rate counted at 16 breaths/min by me  Abdominal: Soft. Bowel sounds are normal. He exhibits no distension. There is no tenderness.  Musculoskeletal: Normal range of motion. He exhibits no edema or tenderness.  Neurological: He is alert. Coordination normal.  Skin: Skin is warm and dry. No rash noted.  Psychiatric: He has a normal mood and affect.  Nursing note and vitals  reviewed.    ED Treatments / Results  Labs (all labs ordered are listed, but only abnormal results are displayed) Labs Reviewed - No data to display  EKG EKG Interpretation  Date/Time:  Sunday June 07 2018 08:19:20 EDT Ventricular Rate:  97 PR Interval:    QRS Duration: 142 QT Interval:  379 QTC Calculation: 494 R Axis:   77 Text Interpretation:  Sinus arrhythmia Paired ventricular premature complexes Consider right atrial enlargement Right bundle branch block Since last tracing rate slower Confirmed by Orlie Dakin [phone]) on 06/07/2018 8:25:39 AM   Radiology No results found.  Procedures Procedures (including critical care time)  Medications Ordered in ED Medications - No data to display   Initial Impression / Assessment and Plan / ED Course  I have reviewed the triage vital signs and the nursing notes.  Pertinent labs & imaging results that were available during my care of the patient were reviewed by me and considered in my medical decision making (see chart for details).     9:15 AM patient is able to ambulate around the emergency department without dyspnea.  Breathing feels normal to him.  Pulse oximetry on room air while ambulating 93%.  He is in no respiratory distress. Plan prescription prednisone.  He is told to use his albuterol nebulizer every 4 hours as needed.  Return if any more than every 4 hours.  Final Clinical Impressions(s) / ED Diagnoses  Diagnosis COPD exacerbation Final diagnoses:  None    ED Discharge Orders    None       Orlie Dakin, MD 06/07/18 2010

## 2018-06-07 NOTE — Discharge Instructions (Addendum)
Start taking the prednisone prescribed today.  Use your albuterol nebulizer every 4 hours as needed for shortness of breath.  Return to the emergency department if needed more than every 4 hours or call Dr. Joylene Draft to be seen in the office.  Return if concern for any reason

## 2018-06-13 ENCOUNTER — Emergency Department (HOSPITAL_COMMUNITY): Payer: Medicare Other

## 2018-06-13 ENCOUNTER — Encounter (HOSPITAL_COMMUNITY): Payer: Self-pay | Admitting: *Deleted

## 2018-06-13 ENCOUNTER — Emergency Department (HOSPITAL_COMMUNITY)
Admission: EM | Admit: 2018-06-13 | Discharge: 2018-06-13 | Disposition: A | Discharge status: Home / Self Care | Payer: Medicare Other | Attending: Emergency Medicine | Admitting: Emergency Medicine

## 2018-06-13 ENCOUNTER — Other Ambulatory Visit: Payer: Self-pay

## 2018-06-13 DIAGNOSIS — E039 Hypothyroidism, unspecified: Secondary | ICD-10-CM | POA: Diagnosis not present

## 2018-06-13 DIAGNOSIS — J441 Chronic obstructive pulmonary disease with (acute) exacerbation: Secondary | ICD-10-CM | POA: Diagnosis not present

## 2018-06-13 DIAGNOSIS — I2510 Atherosclerotic heart disease of native coronary artery without angina pectoris: Secondary | ICD-10-CM | POA: Diagnosis not present

## 2018-06-13 DIAGNOSIS — Z79899 Other long term (current) drug therapy: Secondary | ICD-10-CM | POA: Insufficient documentation

## 2018-06-13 DIAGNOSIS — Z8673 Personal history of transient ischemic attack (TIA), and cerebral infarction without residual deficits: Secondary | ICD-10-CM | POA: Insufficient documentation

## 2018-06-13 DIAGNOSIS — Z87891 Personal history of nicotine dependence: Secondary | ICD-10-CM | POA: Insufficient documentation

## 2018-06-13 DIAGNOSIS — Z7982 Long term (current) use of aspirin: Secondary | ICD-10-CM | POA: Diagnosis not present

## 2018-06-13 DIAGNOSIS — E876 Hypokalemia: Secondary | ICD-10-CM | POA: Insufficient documentation

## 2018-06-13 DIAGNOSIS — R0602 Shortness of breath: Secondary | ICD-10-CM | POA: Diagnosis present

## 2018-06-13 DIAGNOSIS — E785 Hyperlipidemia, unspecified: Secondary | ICD-10-CM | POA: Diagnosis not present

## 2018-06-13 LAB — BASIC METABOLIC PANEL
ANION GAP: 8 (ref 5–15)
BUN: 15 mg/dL (ref 6–20)
CO2: 34 mmol/L — AB (ref 22–32)
Calcium: 8.5 mg/dL — ABNORMAL LOW (ref 8.9–10.3)
Chloride: 98 mmol/L — ABNORMAL LOW (ref 101–111)
Creatinine, Ser: 0.89 mg/dL (ref 0.61–1.24)
GFR calc Af Amer: >60 mL/min (ref >60)
GFR calc non Af Amer: >60 mL/min (ref >60)
GLUCOSE: 156 mg/dL — AB (ref 65–99)
POTASSIUM: 2.9 mmol/L — AB (ref 3.5–5.1)
Sodium: 140 mmol/L (ref 135–145)

## 2018-06-13 LAB — CBC WITH DIFFERENTIAL/PLATELET
ABS IMMATURE GRANULOCYTES: 0.2 10*3/uL — AB (ref 0.0–0.1)
BASOS ABS: 0.1 10*3/uL (ref 0.0–0.1)
Basophils Relative: 1 %
Eosinophils Absolute: 0.1 10*3/uL (ref 0.0–0.7)
Eosinophils Relative: 1 %
HCT: 45.4 % (ref 39.0–52.0)
HEMOGLOBIN: 13.7 g/dL (ref 13.0–17.0)
IMMATURE GRANULOCYTES: 2 %
LYMPHS PCT: 6 %
Lymphs Abs: 0.6 10*3/uL — ABNORMAL LOW (ref 0.7–4.0)
MCH: 26.4 pg (ref 26.0–34.0)
MCHC: 30.2 g/dL (ref 30.0–36.0)
MCV: 87.5 fL (ref 78.0–100.0)
MONO ABS: 1 10*3/uL (ref 0.1–1.0)
MONOS PCT: 10 %
NEUTROS ABS: 8.3 10*3/uL — AB (ref 1.7–7.7)
NEUTROS PCT: 80 %
Platelets: 190 10*3/uL (ref 150–400)
RBC: 5.19 MIL/uL (ref 4.22–5.81)
RDW: 14.2 % (ref 11.5–15.5)
WBC: 10.4 10*3/uL (ref 4.0–10.5)

## 2018-06-13 MED ORDER — POTASSIUM CHLORIDE CRYS ER 20 MEQ PO TBCR
40.0000 meq | EXTENDED_RELEASE_TABLET | Freq: Once | ORAL | Status: AC
  Administered 2018-06-13: 40 meq via ORAL
  Filled 2018-06-13: qty 2

## 2018-06-13 MED ORDER — POTASSIUM CHLORIDE 10 MEQ/100ML IV SOLN
10.0000 meq | INTRAVENOUS | Status: AC
  Administered 2018-06-13: 10 meq via INTRAVENOUS
  Filled 2018-06-13: qty 100

## 2018-06-13 MED ORDER — IPRATROPIUM-ALBUTEROL 0.5-2.5 (3) MG/3ML IN SOLN: 3.0000 mL | Freq: Four times a day (QID) | RESPIRATORY_TRACT | 0 refills | Status: DC | PRN

## 2018-06-13 MED ORDER — IPRATROPIUM-ALBUTEROL 0.5-2.5 (3) MG/3ML IN SOLN
3.0000 mL | Freq: Once | RESPIRATORY_TRACT | Status: AC
  Administered 2018-06-13: 3 mL via RESPIRATORY_TRACT
  Filled 2018-06-13: qty 3

## 2018-06-13 MED ORDER — PREDNISONE 20 MG PO TABS: 60.0000 mg | ORAL_TABLET | Freq: Every day | ORAL | 0 refills | Status: DC

## 2018-06-13 NOTE — ED Provider Notes (Signed)
[facility] EMERGENCY DEPARTMENT Provider Note   CSN: [id number] Arrival date & time: 06/13/18  0448     History   Chief Complaint Chief Complaint  Patient presents with  . Shortness of Breath    HPI Stephen [name] is a 62 y.o. male.  The history is provided by the patient.  He has history of COPD, lung cancer, throat cancer, coronary artery disease, hyperlipidemia and comes in with a difficulty breathing for the last 2 days.  He had recently been seen in the ED with a COPD exacerbation and was on a steroid taper.  For the last 2 days, he has been on prednisone 20 mg a day he denies fever, chills, sweats.  He has had a cough productive of yellow sputum, but he feels it is coming from his throat not it from his chest.  He denies chest pain, heaviness, tightness, pressure.  He has been using his home nebulizer without any benefit.  He was brought in by ambulance was given magnesium sulfate as well as methylprednisolone.  He states he is feeling somewhat better following the magnesium.  Past Medical History:  Diagnosis Date  . Allergic rhinitis, cause unspecified   . Anxiety   . Blindness of left eye   . Complication of anesthesia    difficulty awaking after colonoscopy   . Depression   . Dyspnea   . GERD (gastroesophageal reflux disease)   . Hypothyroidism   . Lung cancer ([place])    lung ca dx 06, has had chemo and radiation  . Osteoporosis   . Other emphysema ([place])    copd  . Throat cancer ([place])    throat ca dx 2007  . Thrombosed hemorrhoids   . Tubular adenoma of colon 2009  . Unspecified vitamin D deficiency     Patient Active Problem List   Diagnosis Date Noted  . Hyperlipidemia 05/22/2018  . CAD (coronary artery disease)   . COPD with acute exacerbation ([place]) 05/05/2018  . Elevated troponin 05/04/2018  . Acute respiratory failure with hypoxia ([place]) 05/04/2018  . 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 Alpha-1-antitrypsin deficiency  carrier ([place]) 07/17/2016  . Eosinophilia 05/30/2016  . History of seasonal allergies 05/30/2016  . Stopped smoking with greater than 40 pack year history 05/30/2016  . History of lobectomy of lung 05/30/2016  . COLD (chronic obstructive lung disease) ([place]) 07/27/2015  . History of elevated PSA 07/27/2015  . COPD exacerbation ([place]) 01/23/2015  . COPD, severe ([place]) 01/12/2015  . Research study patient 10/07/2014  . Need for prophylactic vaccination against Streptococcus pneumoniae (pneumococcus) 09/15/2014  . Chest pain 08/09/2013  . History of TIA (transient ischemic attack) 08/09/2013  . Otitis media of left ear 08/09/2013  . 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 Hemorrhoids, external, thrombosed 08/26/2011  . BACK PAIN 02/22/2011  . COPD (chronic obstructive pulmonary disease) ([place]) 02/06/2011  . ROTATOR CUFF TEAR 02/15/2010  . ALLERGIC RHINITIS 04/15/2008    Past Surgical History:  Procedure Laterality Date  . COLONOSCOPY    . HERNIA REPAIR     as a baby- not sure where hernia was  . KNEE SURGERY Left 1972   .  has hardware  . LUNG LOBECTOMY     RUL  . MICROLARYNGOSCOPY WITH CO2 LASER AND EXCISION OF VOCAL CORD LESION N/A 12/19/2017   Procedure: MICROLARYNGOSCOPY WITH BIOPSY;  Surgeon: Melida Quitter, MD;  Location: [facility];  Service: ENT;  Laterality: N/A;  . ROTATOR  CUFF REPAIR Left   . SHOULDER SURGERY Right    rotator cuff  . THROAT SURGERY     Laser surgery for throat cancer  . TRANSTHORACIC ECHOCARDIOGRAM  01/13/2012   EF=>55%; trace MR/TR;         Home Medications    Prior to Admission medications   Medication Sig Start Date End Date Taking? Authorizing Provider  albuterol (PROVENTIL HFA;VENTOLIN HFA) 108 (90 Base) MCG/ACT inhaler Inhale 1-2 puffs into the lungs every 6 (six) hours as needed for wheezing or shortness of breath.    [provider]  albuterol (PROVENTIL) (2.5 MG/3ML) 0.083% nebulizer solution Take 2.5 mg by nebulization 3  (three) times daily as needed for wheezing or shortness of breath.     [provider]  aspirin 81 MG chewable tablet Chew 81 mg by mouth daily.    [provider]  benzonatate (TESSALON) 100 MG capsule Take 100 mg by mouth every 8 (eight) hours as needed for cough. 03/19/18   [provider]  Fluticasone-Umeclidin-Vilant (TRELEGY ELLIPTA) 100-62.5-25 MCG/INH AEPB Inhale 1 puff into the lungs daily. 08/13/17   Brand Males, MD  Fluticasone-Umeclidin-Vilant (TRELEGY ELLIPTA) 100-62.5-25 MCG/INH AEPB Inhale 1 puff into the lungs daily. 05/27/18   Brand Males, MD  levothyroxine (SYNTHROID, LEVOTHROID) 125 MCG tablet Take 62.5 mcg by mouth daily before breakfast.     [provider]  metoprolol tartrate (LOPRESSOR) 50 MG tablet Take 1 tablet (50 mg total) by mouth once for 1 dose. 05/22/18 05/22/18  Richardson Dopp T, PA-C  mirtazapine (REMERON) 15 MG tablet Take 15 mg by mouth every evening. 04/15/18   [provider]  nitroGLYCERIN (NITROSTAT) 0.4 MG SL tablet Place 1 tablet (0.4 mg total) under the tongue every 5 (five) minutes as needed for chest pain. 08/09/13   Delfina Redwood, MD  omeprazole (PRILOSEC) 40 MG capsule Take 1 capsule (40 mg total) by mouth daily. 04/08/18   Ladene Artist, MD  pravastatin (PRAVACHOL) 40 MG tablet Take 40 mg by mouth at bedtime.     [provider]  predniSONE (DELTASONE) 20 MG tablet 3 tablets for 2 days starting 06/07/2018, then 2 tablets for 2 days then 1 tablet for 2 days 06/07/18   Orlie Dakin, MD  temazepam (RESTORIL) 30 MG capsule Take 30 mg by mouth at bedtime.     [provider]  Vitamin D, Ergocalciferol, (DRISDOL) 50000 UNITS CAPS Take 50,000 Units by mouth every 14 (fourteen) days.  05/07/11   [provider]    Family History Family History  Problem Relation Age of Onset  . Cancer Mother        Brain tumor  . COPD Mother   . Heart disease Father        CABG  . Prostate  cancer Father   . Cystic fibrosis Sister        also heart failure  . Mental illness Brother   . Heart failure Paternal Grandmother   . Colon cancer Neg Hx   . Colon polyps Neg Hx   . Esophageal cancer Neg Hx     Social History Social History   Tobacco Use  . Smoking status: Former Smoker    Packs/day: 1.50    Years: 32.00    Pack years: 48.00    Types: Cigarettes    Last attempt to quit: 12/30/2004    Years since quitting: 13.4  . Smokeless tobacco: Never Used  . Tobacco comment: 1 ppd x 30 years  Substance Use Topics  . Alcohol use: Yes    Comment: 1- 2 beers monthly  . Drug use: No     Allergies   Codeine; Cymbalta [duloxetine hcl]; and Montelukast sodium   Review of Systems Review of Systems  All other systems reviewed and are negative.    Physical Exam Updated Vital Signs BP 132/82   Pulse (!) 114   Temp 98.3 F (36.8 C)   Resp (!) 30   SpO2 100%   Physical Exam  Nursing note and vitals reviewed.  62 year old male, resting comfortably and in no acute distress. Vital signs are significant for rapid heart rate. Oxygen saturation is 100%, which is normal. Head is normocephalic and atraumatic. PERRLA, EOMI. Oropharynx is clear. Neck is nontender and supple without adenopathy or JVD. Back is nontender and there is no CVA tenderness. Lungs have mild wheezing throughout.  There are no rales or rhonchi. Chest is nontender. Heart has regular rate and rhythm without murmur. Abdomen is soft, flat, nontender without masses or hepatosplenomegaly and peristalsis is normoactive. Extremities have no cyanosis or edema, full range of motion is present. Skin is warm and dry without rash. Neurologic: Mental status is normal, cranial nerves are intact, there are no motor or sensory deficits.  ED Treatments / Results  Labs (all labs ordered are listed, but only abnormal results are displayed) Labs Reviewed  BASIC METABOLIC PANEL - Abnormal; Notable for the following  components:      Result Value   Potassium 2.9 (*)    Chloride 98 (*)    CO2 34 (*)    Glucose, Bld 156 (*)    Calcium 8.5 (*)    All other components within normal limits  CBC WITH DIFFERENTIAL/PLATELET - Abnormal; Notable for the following components:   Neutro Abs 8.3 (*)    Lymphs Abs 0.6 (*)    Abs Immature Granulocytes 0.2 (*)    All other components within normal limits    EKG EKG Interpretation  Date/Time:  Saturday June 13 2018 04:56:28 EDT Ventricular Rate:  115 PR Interval:    QRS Duration: 147 QT Interval:  348 QTC Calculation: 480 R Axis:   71 Text Interpretation:  Sinus tachycardia Atrial premature complexes Biatrial enlargement Right bundle branch block When compared with ECG of 06/07/2018, Premature ventricular complexes are no longer present Confirmed by Delora Fuel ([id number]) on 06/13/2018 5:04:01 AM   Radiology Dg Chest Portable 1 View  Result Date: 06/13/2018 CLINICAL DATA:  Shortness of breath. EXAM: PORTABLE CHEST 1 VIEW COMPARISON:  Radiograph 05/24/2018, CT 05/04/2018 FINDINGS: Chronic right lung volume loss with stable pleuroparenchymal opacity in surgical clips in the apex. Emphysema. Unchanged heart size and mediastinal contours. No new airspace disease, pleural effusion or pneumothorax. No pulmonary edema. IMPRESSION: 1. No acute findings. 2. Postsurgical change in the right hemithorax. 3. Emphysema. Electronically Signed   By: Jeb Levering M.D.   On: 06/13/2018 05:31    Procedures Procedures  Medications Ordered in ED Medications  potassium chloride 10 mEq in 100 mL IVPB (has no administration in time range)  potassium chloride SA (K-DUR,KLOR-CON) CR tablet 40 mEq (has no administration in time range)  ipratropium-albuterol (DUONEB) 0.5-2.5 (3) MG/3ML nebulizer solution 3 mL (has no administration in time range)  ipratropium-albuterol (DUONEB) 0.5-2.5 (3) MG/3ML nebulizer solution 3 mL (3 mLs Nebulization Given 06/13/18 0515)     Initial  Impression / Assessment and Plan / ED Course  I have reviewed the triage vital signs and the  nursing notes.  Pertinent labs & imaging results that were available during my care of the patient were reviewed by me and considered in my medical decision making (see chart for details).  COPD exacerbation.  Old records are reviewed, and he had been seen June 9 for COPD exacerbation, also on May 26.  On May 6, he had been admitted to the hospital for COPD exacerbation.  Will give additional albuterol with ipratropium, check chest x-ray.  Given his worsening while still on his steroid taper, anticipate need for admission.  With additional albuterol and ipratropium, he is resting comfortably, and not using accessory muscles of respiration.  On reexam, some mild expiratory rhonchi are noted, no wheezes or rales.  He is taken off an oxygen mask and placed on oxygen via nasal cannula, which is what he has at home.  He will be ambulated in the ED to see if he desaturates.  Labs show significant hypokalemia.  He is being given oral and intravenous potassium.  Case is signed out to Dr. Maryan Rued.  Final Clinical Impressions(s) / ED Diagnoses   Final diagnoses:  COPD exacerbation ([place])  Hypokalemia    ED Discharge Orders    None       Delora Fuel, MD 57/26/20 [phone]

## 2018-06-13 NOTE — ED Provider Notes (Signed)
Patient ambulated without difficulty.  He states he feels that he is at his baseline.  Discussed with the patient increasing his prednisone for the next 5 days to 60 mg and then going on a slow taper over the next 3 weeks which he needs to call his doctor about.  Patient also requesting duo nebs for home as the albuterol is not helping when he really starts feeling short of breath.   Blanchie Dessert, MD 06/13/18 1042

## 2018-06-13 NOTE — ED Notes (Signed)
Pt discharged from ED; instructions provided and scripts given; Pt encouraged to return to ED if symptoms worsen and to f/u with PCP; Pt verbalized understanding of all instructions 

## 2018-06-13 NOTE — ED Notes (Addendum)
Patient's O2 sats maintained between 94-96% on RA while ambulating.

## 2018-06-13 NOTE — ED Triage Notes (Signed)
Pt woke up with increased SOB this morning at 0130, received two albuterol treatments, pt felt better and declined transport. Pt called back for EMS at 0400 for sob; rhonchi and wheezing with EMS, received 10mg  albuterol, .5mg  atrovent, 125mg  Solumedrol, and 2 g Mag with some improvement.

## 2018-06-13 NOTE — ED Notes (Signed)
Gave Pt [place] sand and water.

## 2018-06-24 ENCOUNTER — Other Ambulatory Visit: Payer: Self-pay

## 2018-06-24 ENCOUNTER — Emergency Department (HOSPITAL_COMMUNITY): Payer: Medicare Other

## 2018-06-24 ENCOUNTER — Encounter (HOSPITAL_COMMUNITY): Payer: Self-pay

## 2018-06-24 ENCOUNTER — Inpatient Hospital Stay (HOSPITAL_COMMUNITY)
Admission: EM | Admit: 2018-06-24 | Discharge: 2018-06-25 | DRG: 190 | Disposition: A | Discharge status: Home / Self Care | Payer: Medicare Other | Attending: Internal Medicine | Admitting: Internal Medicine

## 2018-06-24 DIAGNOSIS — E43 Unspecified severe protein-calorie malnutrition: Secondary | ICD-10-CM

## 2018-06-24 DIAGNOSIS — K219 Gastro-esophageal reflux disease without esophagitis: Secondary | ICD-10-CM | POA: Diagnosis present

## 2018-06-24 DIAGNOSIS — J441 Chronic obstructive pulmonary disease with (acute) exacerbation: Secondary | ICD-10-CM | POA: Diagnosis present

## 2018-06-24 DIAGNOSIS — Z8521 Personal history of malignant neoplasm of larynx: Secondary | ICD-10-CM

## 2018-06-24 DIAGNOSIS — M810 Age-related osteoporosis without current pathological fracture: Secondary | ICD-10-CM | POA: Diagnosis present

## 2018-06-24 DIAGNOSIS — Z85819 Personal history of malignant neoplasm of unspecified site of lip, oral cavity, and pharynx: Secondary | ICD-10-CM

## 2018-06-24 DIAGNOSIS — J386 Stenosis of larynx: Secondary | ICD-10-CM | POA: Diagnosis present

## 2018-06-24 DIAGNOSIS — G4700 Insomnia, unspecified: Secondary | ICD-10-CM | POA: Diagnosis present

## 2018-06-24 DIAGNOSIS — E039 Hypothyroidism, unspecified: Secondary | ICD-10-CM | POA: Diagnosis present

## 2018-06-24 DIAGNOSIS — I10 Essential (primary) hypertension: Secondary | ICD-10-CM | POA: Diagnosis present

## 2018-06-24 DIAGNOSIS — R627 Adult failure to thrive: Secondary | ICD-10-CM | POA: Diagnosis present

## 2018-06-24 DIAGNOSIS — J9601 Acute respiratory failure with hypoxia: Secondary | ICD-10-CM | POA: Diagnosis present

## 2018-06-24 DIAGNOSIS — F419 Anxiety disorder, unspecified: Secondary | ICD-10-CM | POA: Diagnosis present

## 2018-06-24 DIAGNOSIS — Z9002 Acquired absence of larynx: Secondary | ICD-10-CM

## 2018-06-24 DIAGNOSIS — I248 Other forms of acute ischemic heart disease: Secondary | ICD-10-CM | POA: Diagnosis present

## 2018-06-24 DIAGNOSIS — Z87891 Personal history of nicotine dependence: Secondary | ICD-10-CM | POA: Diagnosis not present

## 2018-06-24 DIAGNOSIS — Z79899 Other long term (current) drug therapy: Secondary | ICD-10-CM

## 2018-06-24 DIAGNOSIS — T380X5A Adverse effect of glucocorticoids and synthetic analogues, initial encounter: Secondary | ICD-10-CM | POA: Diagnosis present

## 2018-06-24 DIAGNOSIS — I2510 Atherosclerotic heart disease of native coronary artery without angina pectoris: Secondary | ICD-10-CM | POA: Diagnosis present

## 2018-06-24 DIAGNOSIS — E785 Hyperlipidemia, unspecified: Secondary | ICD-10-CM | POA: Diagnosis present

## 2018-06-24 DIAGNOSIS — D72829 Elevated white blood cell count, unspecified: Secondary | ICD-10-CM | POA: Diagnosis present

## 2018-06-24 DIAGNOSIS — K224 Dyskinesia of esophagus: Secondary | ICD-10-CM | POA: Diagnosis present

## 2018-06-24 DIAGNOSIS — H5462 Unqualified visual loss, left eye, normal vision right eye: Secondary | ICD-10-CM | POA: Diagnosis present

## 2018-06-24 DIAGNOSIS — Z9221 Personal history of antineoplastic chemotherapy: Secondary | ICD-10-CM

## 2018-06-24 DIAGNOSIS — Z923 Personal history of irradiation: Secondary | ICD-10-CM

## 2018-06-24 DIAGNOSIS — Z6822 Body mass index (BMI) 22.0-22.9, adult: Secondary | ICD-10-CM

## 2018-06-24 DIAGNOSIS — F329 Major depressive disorder, single episode, unspecified: Secondary | ICD-10-CM | POA: Diagnosis present

## 2018-06-24 DIAGNOSIS — Z9981 Dependence on supplemental oxygen: Secondary | ICD-10-CM | POA: Diagnosis not present

## 2018-06-24 DIAGNOSIS — Z902 Acquired absence of lung [part of]: Secondary | ICD-10-CM

## 2018-06-24 DIAGNOSIS — Z7982 Long term (current) use of aspirin: Secondary | ICD-10-CM

## 2018-06-24 DIAGNOSIS — J9621 Acute and chronic respiratory failure with hypoxia: Secondary | ICD-10-CM | POA: Diagnosis present

## 2018-06-24 DIAGNOSIS — Z885 Allergy status to narcotic agent status: Secondary | ICD-10-CM

## 2018-06-24 DIAGNOSIS — Z888 Allergy status to other drugs, medicaments and biological substances status: Secondary | ICD-10-CM

## 2018-06-24 DIAGNOSIS — R52 Pain, unspecified: Secondary | ICD-10-CM

## 2018-06-24 DIAGNOSIS — R931 Abnormal findings on diagnostic imaging of heart and coronary circulation: Secondary | ICD-10-CM | POA: Diagnosis present

## 2018-06-24 DIAGNOSIS — Z85118 Personal history of other malignant neoplasm of bronchus and lung: Secondary | ICD-10-CM

## 2018-06-24 LAB — CBC WITH DIFFERENTIAL/PLATELET
Abs Immature Granulocytes: 0.2 10*3/uL — ABNORMAL HIGH (ref 0.0–0.1)
BASOS ABS: 0.1 10*3/uL (ref 0.0–0.1)
Basophils Relative: 1 %
EOS PCT: 2 %
Eosinophils Absolute: 0.3 10*3/uL (ref 0.0–0.7)
HCT: 46 % (ref 39.0–52.0)
Hemoglobin: 14.4 g/dL (ref 13.0–17.0)
Immature Granulocytes: 1 %
LYMPHS PCT: 11 %
Lymphs Abs: 1.7 10*3/uL (ref 0.7–4.0)
MCH: 26.9 pg (ref 26.0–34.0)
MCHC: 31.3 g/dL (ref 30.0–36.0)
MCV: 86 fL (ref 78.0–100.0)
MONO ABS: 1.3 10*3/uL — AB (ref 0.1–1.0)
Monocytes Relative: 9 %
Neutro Abs: 11.8 10*3/uL — ABNORMAL HIGH (ref 1.7–7.7)
Neutrophils Relative %: 76 %
PLATELETS: 264 10*3/uL (ref 150–400)
RBC: 5.35 MIL/uL (ref 4.22–5.81)
RDW: 13.9 % (ref 11.5–15.5)
WBC: 15.5 10*3/uL — AB (ref 4.0–10.5)

## 2018-06-24 LAB — TROPONIN I: Troponin I: <0.03 ng/mL (ref <0.03)

## 2018-06-24 LAB — BASIC METABOLIC PANEL
Anion gap: 11 (ref 5–15)
BUN: 9 mg/dL (ref 8–23)
CALCIUM: 9 mg/dL (ref 8.9–10.3)
CO2: 31 mmol/L (ref 22–32)
Chloride: 95 mmol/L — ABNORMAL LOW (ref 98–111)
Creatinine, Ser: 0.83 mg/dL (ref 0.61–1.24)
GFR calc Af Amer: >60 mL/min (ref >60)
GLUCOSE: 116 mg/dL — AB (ref 70–99)
Potassium: 3.4 mmol/L — ABNORMAL LOW (ref 3.5–5.1)
SODIUM: 137 mmol/L (ref 135–145)

## 2018-06-24 LAB — MAGNESIUM: MAGNESIUM: 3.2 mg/dL — AB (ref 1.7–2.4)

## 2018-06-24 LAB — MRSA PCR SCREENING: MRSA by PCR: NEGATIVE

## 2018-06-24 LAB — BRAIN NATRIURETIC PEPTIDE: B Natriuretic Peptide: 61.4 pg/mL (ref 0.0–100.0)

## 2018-06-24 MED ORDER — ALBUTEROL SULFATE (2.5 MG/3ML) 0.083% IN NEBU: 2.5000 mg | INHALATION_SOLUTION | RESPIRATORY_TRACT | Status: DC | PRN

## 2018-06-24 MED ORDER — FLUTICASONE-UMECLIDIN-VILANT 100-62.5-25 MCG/INH IN AEPB
1.0000 | INHALATION_SPRAY | Freq: Every day | RESPIRATORY_TRACT | Status: DC
  Filled 2018-06-24 (×2): qty 1

## 2018-06-24 MED ORDER — FAMOTIDINE IN NACL 20-0.9 MG/50ML-% IV SOLN: 20.0000 mg | Freq: Two times a day (BID) | INTRAVENOUS | Status: DC

## 2018-06-24 MED ORDER — ADULT MULTIVITAMIN W/MINERALS CH
1.0000 | ORAL_TABLET | Freq: Every day | ORAL | Status: DC
  Administered 2018-06-25: 1 via ORAL
  Filled 2018-06-24: qty 1

## 2018-06-24 MED ORDER — ENSURE ENLIVE PO LIQD
237.0000 mL | Freq: Three times a day (TID) | ORAL | Status: DC
  Administered 2018-06-24 – 2018-06-25 (×3): 237 mL via ORAL

## 2018-06-24 MED ORDER — TEMAZEPAM 15 MG PO CAPS
30.0000 mg | ORAL_CAPSULE | Freq: Every day | ORAL | Status: DC
  Administered 2018-06-24: 30 mg via ORAL
  Filled 2018-06-24: qty 2

## 2018-06-24 MED ORDER — ALPRAZOLAM 0.25 MG PO TABS
0.2500 mg | ORAL_TABLET | Freq: Three times a day (TID) | ORAL | Status: DC | PRN
  Administered 2018-06-24: 0.25 mg via ORAL
  Filled 2018-06-24: qty 1

## 2018-06-24 MED ORDER — METOPROLOL TARTRATE 50 MG PO TABS
50.0000 mg | ORAL_TABLET | Freq: Every day | ORAL | Status: DC
  Administered 2018-06-24 – 2018-06-25 (×2): 50 mg via ORAL
  Filled 2018-06-24 (×2): qty 1

## 2018-06-24 MED ORDER — ASPIRIN 81 MG PO CHEW
81.0000 mg | CHEWABLE_TABLET | Freq: Every day | ORAL | Status: DC
  Administered 2018-06-24 – 2018-06-25 (×2): 81 mg via ORAL
  Filled 2018-06-24 (×2): qty 1

## 2018-06-24 MED ORDER — IPRATROPIUM-ALBUTEROL 0.5-2.5 (3) MG/3ML IN SOLN
3.0000 mL | Freq: Four times a day (QID) | RESPIRATORY_TRACT | Status: DC
  Administered 2018-06-24 (×2): 3 mL via RESPIRATORY_TRACT
  Filled 2018-06-24 (×2): qty 3

## 2018-06-24 MED ORDER — IPRATROPIUM BROMIDE 0.02 % IN SOLN
0.5000 mg | Freq: Once | RESPIRATORY_TRACT | Status: AC
  Administered 2018-06-24: 0.5 mg via RESPIRATORY_TRACT
  Filled 2018-06-24: qty 2.5

## 2018-06-24 MED ORDER — DEXAMETHASONE SODIUM PHOSPHATE 10 MG/ML IJ SOLN
20.0000 mg | INTRAMUSCULAR | Status: DC
  Administered 2018-06-24 – 2018-06-25 (×2): 20 mg via INTRAVENOUS
  Filled 2018-06-24 (×2): qty 2

## 2018-06-24 MED ORDER — ACETAMINOPHEN 650 MG RE SUPP: 650.0000 mg | Freq: Four times a day (QID) | RECTAL | Status: DC | PRN

## 2018-06-24 MED ORDER — METHYLPREDNISOLONE SODIUM SUCC 125 MG IJ SOLR
60.0000 mg | Freq: Four times a day (QID) | INTRAMUSCULAR | Status: DC
  Administered 2018-06-24: 60 mg via INTRAVENOUS
  Filled 2018-06-24: qty 2

## 2018-06-24 MED ORDER — ACETAMINOPHEN 325 MG PO TABS: 650.0000 mg | ORAL_TABLET | Freq: Four times a day (QID) | ORAL | Status: DC | PRN

## 2018-06-24 MED ORDER — SODIUM CHLORIDE 0.9% FLUSH
3.0000 mL | Freq: Two times a day (BID) | INTRAVENOUS | Status: DC
  Administered 2018-06-24 – 2018-06-25 (×3): 3 mL via INTRAVENOUS

## 2018-06-24 MED ORDER — MIRTAZAPINE 7.5 MG PO TABS: 15.0000 mg | ORAL_TABLET | Freq: Every evening | ORAL | Status: DC | PRN

## 2018-06-24 MED ORDER — ENOXAPARIN SODIUM 40 MG/0.4ML ~~LOC~~ SOLN
40.0000 mg | SUBCUTANEOUS | Status: DC
  Filled 2018-06-24 (×2): qty 0.4

## 2018-06-24 MED ORDER — VITAMIN D (ERGOCALCIFEROL) 1.25 MG (50000 UNIT) PO CAPS
50000.0000 [IU] | ORAL_CAPSULE | ORAL | Status: DC
  Filled 2018-06-24: qty 1

## 2018-06-24 MED ORDER — PANTOPRAZOLE SODIUM 40 MG PO TBEC
40.0000 mg | DELAYED_RELEASE_TABLET | Freq: Every day | ORAL | Status: DC
  Administered 2018-06-24 – 2018-06-25 (×2): 40 mg via ORAL
  Filled 2018-06-24 (×2): qty 1

## 2018-06-24 MED ORDER — LEVOTHYROXINE SODIUM 50 MCG PO TABS
62.5000 ug | ORAL_TABLET | Freq: Every day | ORAL | Status: DC
  Administered 2018-06-25: 62.5 ug via ORAL
  Filled 2018-06-24: qty 1

## 2018-06-24 MED ORDER — ONDANSETRON HCL 4 MG PO TABS: 4.0000 mg | ORAL_TABLET | Freq: Four times a day (QID) | ORAL | Status: DC | PRN

## 2018-06-24 MED ORDER — FAMOTIDINE 20 MG PO TABS
20.0000 mg | ORAL_TABLET | Freq: Two times a day (BID) | ORAL | Status: DC
  Administered 2018-06-24 – 2018-06-25 (×3): 20 mg via ORAL
  Filled 2018-06-24 (×3): qty 1

## 2018-06-24 MED ORDER — METOPROLOL TARTRATE 5 MG/5ML IV SOLN: 5.0000 mg | Freq: Four times a day (QID) | INTRAVENOUS | Status: DC

## 2018-06-24 MED ORDER — ALBUTEROL (5 MG/ML) CONTINUOUS INHALATION SOLN
10.0000 mg/h | INHALATION_SOLUTION | Freq: Once | RESPIRATORY_TRACT | Status: AC
Start: 2018-06-24 — End: 2018-06-24
  Administered 2018-06-24: 10 mg/h via RESPIRATORY_TRACT
  Filled 2018-06-24: qty 20

## 2018-06-24 MED ORDER — PRAVASTATIN SODIUM 40 MG PO TABS
40.0000 mg | ORAL_TABLET | Freq: Every day | ORAL | Status: DC
  Administered 2018-06-24: 40 mg via ORAL
  Filled 2018-06-24: qty 1

## 2018-06-24 MED ORDER — LEVOTHYROXINE SODIUM 100 MCG IV SOLR: 62.5000 ug | Freq: Every day | INTRAVENOUS | Status: DC

## 2018-06-24 MED ORDER — ONDANSETRON HCL 4 MG/2ML IJ SOLN: 4.0000 mg | Freq: Four times a day (QID) | INTRAMUSCULAR | Status: DC | PRN

## 2018-06-24 NOTE — H&P (Addendum)
History and Physical    Stephen [name]:[id number] DOB: March 19, 1956 DOA: 06/24/2018  **Will admit patient based on the expectation that the patient will need hospitalization/ hospital care that crosses at least 2 midnights  PCP: [name], MD   Attending physician: Lorin Mercy  Patient coming from/Resides with: Private residence/wife  Chief Complaint: Shortness of breath  HPI: Stephen [name] is a 62 y.o. 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  Patient was last hospitalized May 2019 for COPD exacerbation with hypoxemia.  He returns to the ER complaining of increasing shortness of breath over several days.  He was started this past Sunday on prednisone but felt he was not improving and stopped taking this medication after 2 days.  He has utilized his home inhalers and nebulizers without improvement in symptoms.  He had significant increased work of breathing upon presentation to the ER and therefore was placed on BiPAP with significant improvement in his symptoms.  Chest x-ray was unremarkable.  Patient did have leukocytosis without fever in the context of recent prednisone usage and this is likely secondary to stress demargination.  ED Course:  Vital Signs: BP (!) 140/95   Pulse (!) 112   Temp 97.6 F (36.4 C) (Axillary)   Resp (!) 22   Ht 5\' 10"  (1.778 m)   Wt 73.9 kg (163 lb)   SpO2 97%   BMI 23.39 kg/m  CXR: Neg Lab data: Sodium 137, potassium 3.4, chloride 95, CO2 31, glucose 116, BUN 9, creatinine 0.83, calcium 9.0, anion gap 11, BNP 61, troponin normal, white count 15,500 with neutrophils 76%, absolute  neutrophils 11.8%, hemoglobin 14.4, platelets 264,000 Medications and treatments: Per EMS: Albuterol continuous neb 10 mg x 1, Atrovent 1 mg x 1, Solu-Medrol 125 mg IV x1, magnesium 2 g IV x1 and Zofran 4 mg IV x1; per EDP: Continuous albuterol neb 10 mg x 1, Atrovent neb 0.5 mg x 1  Review of Systems:  In addition to the HPI above,  No Fever-chills, myalgias or other constitutional symptoms No Headache, changes with Vision or hearing, new weakness, tingling, numbness in any extremity, dizziness, dysarthria or word finding difficulty, gait disturbance or imbalance, tremors or seizure activity No choking or coughing while eating, abdominal pain with or after eating No Chest pain, Cough or Shortness of Breath, palpitations, orthopnea or DOE-he does report loss of appetite and foods having no taste No Abdominal pain, nausea patient does report posttussive emesis, melena,hematochezia, dark tarry stools, constipation No dysuria, malodorous urine, hematuria or flank pain No new skin rashes, lesions, masses or bruises, No new joint pains, aches, swelling or redness No recent unintentional weight gain  No polyuria, polydypsia or polyphagia   Past Medical History:  Diagnosis Date  . Allergic rhinitis, cause unspecified   . Anxiety   . Blindness of left eye   . Complication of anesthesia    difficulty awaking after colonoscopy   . Depression   . Dyspnea   . GERD (gastroesophageal reflux disease)   . Hypothyroidism   . Lung cancer ([place])    lung ca dx 06, has had chemo and radiation  . Osteoporosis   . Other emphysema ([place])    copd  .  Throat cancer ([place])    throat ca dx 2007  . Thrombosed hemorrhoids   . Tubular adenoma of colon 2009  . Unspecified vitamin D deficiency     Past Surgical History:  Procedure Laterality Date  . COLONOSCOPY    . HERNIA REPAIR     as a baby- not sure where hernia was  . KNEE SURGERY Left 1972   .  has hardware  . LUNG LOBECTOMY     RUL  .  MICROLARYNGOSCOPY WITH CO2 LASER AND EXCISION OF VOCAL CORD LESION N/A 12/19/2017   Procedure: MICROLARYNGOSCOPY WITH BIOPSY;  Surgeon: Melida Quitter, MD;  Location: [facility];  Service: ENT;  Laterality: N/A;  . ROTATOR CUFF REPAIR Left   . SHOULDER SURGERY Right    rotator cuff  . THROAT SURGERY     Laser surgery for throat cancer  . TRANSTHORACIC ECHOCARDIOGRAM  01/13/2012   EF=>55%; trace MR/TR;     Social History   Socioeconomic History  . Marital status: Single    Spouse name: Not on file  . Number of children: 0  . Years of education: Not on file  . Highest education level: Not on file  Occupational History  . Occupation: Disabled    Fish farm manager: UNEMPLOYED  Social Needs  . Financial resource strain: Not on file  . Food insecurity:    Worry: Not on file    Inability: Not on file  . Transportation needs:    Medical: Not on file    Non-medical: Not on file  Tobacco Use  . Smoking status: Former Smoker    Packs/day: 1.50    Years: 32.00    Pack years: 48.00    Types: Cigarettes    Last attempt to quit: 12/30/2004    Years since quitting: 13.4  . Smokeless tobacco: Never Used  . Tobacco comment: 1 ppd x 30 years  Substance and Sexual Activity  . Alcohol use: Yes    Comment: 1- 2 beers monthly  . Drug use: No  . Sexual activity: Not on file  Lifestyle  . Physical activity:    Days per week: Not on file    Minutes per session: Not on file  . Stress: Not on file  Relationships  . Social connections:    Talks on phone: Not on file    Gets together: Not on file    Attends religious service: Not on file    Active member of club or organization: Not on file    Attends meetings of clubs or organizations: Not on file    Relationship status: Not on file  . Intimate partner violence:    Fear of current or ex partner: Not on file    Emotionally abused: Not on file    Physically abused: Not on file    Forced sexual activity: Not on file  Other Topics Concern  . Not on file   Social History Narrative  . Not on file    Mobility: Independent Work history: Not obtained   Allergies  Allergen Reactions  . Codeine Other (See Comments)    Unknown. Mother told him he was allergic  . Cymbalta [Duloxetine Hcl] Nausea And Vomiting  . Montelukast Sodium Other (See Comments)    Causes sinusitis    Family History  Problem Relation Age of Onset  . Cancer Mother        Brain tumor  . COPD Mother   . Heart disease Father        CABG  .  Prostate cancer Father   . Cystic fibrosis Sister        also heart failure  . Mental illness Brother   . Heart failure Paternal Grandmother   . Colon cancer Neg Hx   . Colon polyps Neg Hx   . Esophageal cancer Neg Hx      Prior to Admission medications   Medication Sig Start Date End Date Taking? Authorizing Provider  albuterol (PROVENTIL HFA;VENTOLIN HFA) 108 (90 Base) MCG/ACT inhaler Inhale 1-2 puffs into the lungs every 6 (six) hours as needed for wheezing or shortness of breath.   Yes [provider]  albuterol (PROVENTIL) (2.5 MG/3ML) 0.083% nebulizer solution Take 2.5 mg by nebulization 3 (three) times daily as needed for wheezing or shortness of breath.    Yes [provider]  aspirin 81 MG chewable tablet Chew 81 mg by mouth daily.   Yes [provider]  Fluticasone-Umeclidin-Vilant (TRELEGY ELLIPTA) 100-62.5-25 MCG/INH AEPB Inhale 1 puff into the lungs daily. 08/13/17  Yes Brand Males, MD  ipratropium-albuterol (DUONEB) 0.5-2.5 (3) MG/3ML SOLN Take 3 mLs by nebulization every 6 (six) hours as needed. Patient taking differently: Take 3 mLs by nebulization every 6 (six) hours as needed (SOB).  06/13/18  Yes Plunkett, Loree Fee, MD  levothyroxine (SYNTHROID, LEVOTHROID) 125 MCG tablet Take 62.5 mcg by mouth daily before breakfast.    Yes [provider]  metoprolol tartrate (LOPRESSOR) 50 MG tablet Take 1 tablet (50 mg total) by mouth once for 1 dose. Patient taking differently:  Take 50 mg by mouth daily.  05/22/18 06/13/26 Yes Weaver, Scott T, PA-C  mirtazapine (REMERON) 15 MG tablet Take 15 mg by mouth at bedtime as needed (sleep).  04/15/18  Yes [provider]  nitroGLYCERIN (NITROSTAT) 0.4 MG SL tablet Place 1 tablet (0.4 mg total) under the tongue every 5 (five) minutes as needed for chest pain. 08/09/13  Yes Delfina Redwood, MD  omeprazole (PRILOSEC) 40 MG capsule Take 1 capsule (40 mg total) by mouth daily. 04/08/18  Yes Ladene Artist, MD  pravastatin (PRAVACHOL) 40 MG tablet Take 40 mg by mouth at bedtime.    Yes [provider]  predniSONE (DELTASONE) 20 MG tablet Take 3 tablets (60 mg total) by mouth daily. 06/13/18  Yes Plunkett, Loree Fee, MD  temazepam (RESTORIL) 30 MG capsule Take 30 mg by mouth at bedtime.    Yes [provider]  Vitamin D, Ergocalciferol, (DRISDOL) 50000 UNITS CAPS Take 50,000 Units by mouth every 14 (fourteen) days.  05/07/11  Yes [provider]  Fluticasone-Umeclidin-Vilant (TRELEGY ELLIPTA) 100-62.5-25 MCG/INH AEPB Inhale 1 puff into the lungs daily. Patient not taking: Reported on 06/13/2018 05/27/18   Brand Males, MD    Physical Exam: Vitals:   06/24/18 0711 06/24/18 0715 06/24/18 0730 06/24/18 0745  BP: (!) 140/95     Pulse: (!) 107 (!) 108 (!) 111 (!) 112  Resp: (!) 26 (!) 21 20 (!) 22  Temp:      TempSrc:      SpO2: 100% 93% 98% 97%  Weight:      Height:          Constitutional: NAD, restless and anxious as well as uncomfortable secondary to BiPAP mask Eyes: PERRL, lids and conjunctivae normal ENMT: Mucous membranes are moist. Posterior pharynx clear of any exudate or lesions. Poor dentition.  Neck: normal, supple, no masses, no thyromegaly Respiratory: Diffuse wheezing but overall good air movement. Normal respiratory effor w/use of accessory muscle use (sternocleidomastoids).  FiO2 40% with sats 99% Cardiovascular: Regular slightly tachycardic rate and rhythm, no murmurs / rubs  / gallops. No extremity edema. 2+ pedal pulses. No carotid bruits.  Abdomen: no tenderness, no masses palpated. No hepatosplenomegaly. Bowel sounds positive.  Musculoskeletal: no clubbing / cyanosis. No joint deformity upper and lower extremities. Good ROM, no contractures. Normal muscle tone.  Skin: no rashes, lesions, ulcers. No induration Neurologic: CN 2-12 grossly intact. Sensation intact, DTR normal. Strength 5/5 x all 4 extremities.  Psychiatric: Normal judgment and insight. Alert and oriented x 3. Normal mood.    Labs on Admission: I have personally reviewed following labs and imaging studies  CBC: Recent Labs  Lab 06/24/18 0551  WBC 15.5*  NEUTROABS 11.8*  HGB 14.4  HCT 46.0  MCV 86.0  PLT 979   Basic Metabolic Panel: Recent Labs  Lab 06/24/18 0551  NA 137  K 3.4*  CL 95*  CO2 31  GLUCOSE 116*  BUN 9  CREATININE 0.83  CALCIUM 9.0   GFR: Estimated Creatinine Clearance: 96.5 mL/min (by C-G formula based on SCr of 0.83 mg/dL). Liver Function Tests: No results for input(s): AST, ALT, ALKPHOS, BILITOT, PROT, ALBUMIN in the last 168 hours. No results for input(s): LIPASE, AMYLASE in the last 168 hours. No results for input(s): AMMONIA in the last 168 hours. Coagulation Profile: No results for input(s): INR, PROTIME in the last 168 hours. Cardiac Enzymes: Recent Labs  Lab 06/24/18 0551  TROPONINI <0.03   BNP (last 3 results) No results for input(s): PROBNP in the last 8760 hours. HbA1C: No results for input(s): HGBA1C in the last 72 hours. CBG: No results for input(s): GLUCAP in the last 168 hours. Lipid Profile: No results for input(s): CHOL, HDL, LDLCALC, TRIG, CHOLHDL, LDLDIRECT in the last 72 hours. Thyroid Function Tests: No results for input(s): TSH, T4TOTAL, FREET4, T3FREE, THYROIDAB in the last 72 hours. Anemia Panel: No results for input(s): VITAMINB12, FOLATE, FERRITIN, TIBC, IRON, RETICCTPCT in the last 72 hours. Urine analysis:    Component  Value Date/Time   COLORURINE AMBER (A) 03/20/2016 1923   APPEARANCEUR CLEAR 03/20/2016 1923   LABSPEC 1.028 03/20/2016 1923   PHURINE 5.5 03/20/2016 1923   GLUCOSEU NEGATIVE 03/20/2016 1923   HGBUR MODERATE (A) 03/20/2016 1923   BILIRUBINUR SMALL (A) 03/20/2016 1923   KETONESUR 15 (A) 03/20/2016 1923   PROTEINUR 100 (A) 03/20/2016 1923   NITRITE NEGATIVE 03/20/2016 1923   LEUKOCYTESUR NEGATIVE 03/20/2016 1923   Sepsis Labs: @LABRCNTIP (procalcitonin:4,lacticidven:4) )No results found for this or any previous visit (from the past 240 hour(s)).   Radiological Exams on Admission: Dg Chest Port 1 View  Result Date: 06/24/2018 CLINICAL DATA:  62 year old male with shortness of breath. EXAM: PORTABLE CHEST 1 VIEW COMPARISON:  Chest radiograph dated 06/13/2018 FINDINGS: Right apical postsurgical changes of lobectomy. There is associated volume loss in the right hemithorax. Emphysematous changes of the lungs. No new consolidative changes. There is no pleural effusion or pneumothorax. Stable cardiomediastinal silhouette. No acute osseous pathology. IMPRESSION: No active disease. Electronically Signed   By: Anner Crete M.D.   On: 06/24/2018 06:11    EKG: (Independently reviewed) sinus rhythm with borderline tachycardia, ventricular rate 98 bpm, QTC 453 ms, short burst of NSVT 3-4 beats, nonspecific ST changes that appear to be related to early repolarization but are not ischemic in nature  Assessment/Plan Principal Problem:   Acute respiratory failure with hypoxia 2/2 Acute exacerbation of chronic obstructive pulmonary disease (COPD)  -Presents with progressive shortness of breath,  nonproductive cough since Sunday consistent with prior COPD exacerbations.  Patient suspects precipitating events are environmental allergens and excessive heat and humidity -Has history of nocturnal hypoxemia and utilizes nasal cannula oxygen at bedtime -Symptoms are improving so will attempt to remove BiPAP and  utilize facemask oxygen-BiPAP prn for now -Scheduled duo nebs with albuterol nebs every 2 hours prn -May benefit from prolonged ambulatory oximetry to determine if has chronic daytime hypoxemia-may also need room air ABG prior to discharge -Solu-Medrol 60 mg IV every 6 hours-recommend prolonged steroid taper -No infiltrates on chest x-ray, cough is nonproductive and no fevers therefore no indication to utilize antibiotics -NPO until tolerates removal of BiPAP  Active Problems:   Hypothyroidism -Continue Synthroid    Esophageal dysmotility/GERD (gastroesophageal reflux disease) -PPI at home-IV H2 blocker until tolerates removal of BiPAP then resume oral PPI -Esophagram April 2019 consistent with generalized decreased esophageal motility 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 Recurrent non-small cell lung cancer, presented with endobronchial lesion involving the right main stem bronchus diagnosed in March 2007.  2. History of stage IB non-small cell lung cancer diagnosed in May 2006.  3. 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 Status post right upper lobectomy on May 01, 2005 under the care of Dr. Arlyce Dice.  2. Status post  laryngoscopy with CO2 laser excision of the right vocal cord lesion under the care of Dr. Redmond Baseman in March 2007.  3. Status post concurrent chemoradiation with weekly carboplatin and paclitaxel. Last dose was given May 12, 2006.  4. Status post 3 cycles of consolidation chemotherapy with docetaxel. Last dose was given August 21, 2006.   PREVIOUS RADIATION THERAPY: Right Lung 6480 cGy in 36 fractions in 2007 - Dr Mila Homer.  -Last evaluated by Dr. Nicolette Bang June 2018 w/ recommendations to return in 3 months for follow-up; patient's chronic dyspnea was discussed and there were concerns that airway narrowing could be responsible for some of these episodes vs progression of underlying lung disease-if tracheal narrowing determined to be the culprit tracheotomy may be indicated    FTT (failure to thrive) in adult -Patient reports 40 lb weight loss since February 2019 -Patient reports food has no taste and therefore he does not want to eat -Utilizes premixed carnation instant breakfast twice daily as protein supplement stating Ensure and boost milk not cost effective -Nutrition consultation -Current weight 163 lbs; weight January 2019 was 197 lbs    Abnormal echocardiogram -During previous admission in May patient was found to have elevated troponins likely related to demand ischemia. -Echocardiogram done at that time revealed basal and mid inferior wall hypokinesis new compared to previous echo with preserved EF. -Evaluated by cardiology; stress test during previous admission did not show any ischemia and showed a fixed defect in the inferior wall -  Recommendation was to continue aspirin and statin on discharge    ??HTN -Continue beta-blocker    Anxiety/depression/insomnia -Once proves can tolerate develop BiPAP resume home medications -Continue Restoril and Remeron     HLD -Continue statin once off BiPAP    **Additional lab, imaging and/or diagnostic evaluation at discretion of  supervising physician  DVT prophylaxis: Lovenox Code Status: Full  Family Communication: Wife Disposition Plan: Home Consults called: ENT/[facility] or colleague    Samella Parr ANP-BC [facility] Pager [phone]   If 7PM-7AM, please contact night-coverage [email or web address] Password [email or web address]  06/24/2018, 7:50 AM

## 2018-06-24 NOTE — ED Provider Notes (Signed)
[facility] EMERGENCY DEPARTMENT Provider Note   CSN: [id number] Arrival date & time: 06/24/18  0450     History   Chief Complaint Chief Complaint  Patient presents with  . Shortness of Breath    HPI Stephen [name] is a 62 y.o. male.  Patient with a history of severe COPD, home O2 mainly used through the night, lung CA s/p lobectomy, GERD, throat cancer, comes from home by EMS with severe SOB. He reports to me this has been ongoing for "a couple of months". He states he was afraid it would get worse prompting ED visit. He denies chest pain. He reports he has not felt like eating. No vomiting or abdominal pain. No diarrhea. He is using his usual medications at home.   The history is provided by the patient. No language interpreter was used.  Shortness of Breath  Pertinent negatives include no fever, no chest pain, no vomiting and no abdominal pain.    Past Medical History:  Diagnosis Date  . Allergic rhinitis, cause unspecified   . Anxiety   . Blindness of left eye   . Complication of anesthesia    difficulty awaking after colonoscopy   . Depression   . Dyspnea   . GERD (gastroesophageal reflux disease)   . Hypothyroidism   . Lung cancer ([place])    lung ca dx 06, has had chemo and radiation  . Osteoporosis   . Other emphysema ([place])    copd  . Throat cancer ([place])    throat ca dx 2007  . Thrombosed hemorrhoids   . Tubular adenoma of colon 2009  . Unspecified vitamin D deficiency     Patient Active Problem List   Diagnosis Date Noted  . Hyperlipidemia 05/22/2018  . CAD (coronary artery disease)   . COPD with acute exacerbation ([place]) 05/05/2018  . Elevated troponin 05/04/2018  . Acute respiratory failure with hypoxia ([place]) 05/04/2018  . Malignant neoplasm of glottis ([place]) 01/13/2018  . Alpha-1-antitrypsin deficiency carrier ([place]) 07/17/2016  . Eosinophilia 05/30/2016  . History of seasonal allergies 05/30/2016  . Stopped smoking with greater  than 40 pack year history 05/30/2016  . History of lobectomy of lung 05/30/2016  . COLD (chronic obstructive lung disease) ([place]) 07/27/2015  . History of elevated PSA 07/27/2015  . COPD exacerbation ([place]) 01/23/2015  . COPD, severe ([place]) 01/12/2015  . Research study patient 10/07/2014  . Need for prophylactic vaccination against Streptococcus pneumoniae (pneumococcus) 09/15/2014  . Chest pain 08/09/2013  . History of TIA (transient ischemic attack) 08/09/2013  . Otitis media of left ear 08/09/2013  . Bronchogenic cancer of right lung ([place]) 09/16/2012  . Hemorrhoids, external, thrombosed 08/26/2011  . BACK PAIN 02/22/2011  . COPD (chronic obstructive pulmonary disease) ([place]) 02/06/2011  . ROTATOR CUFF TEAR 02/15/2010  . ALLERGIC RHINITIS 04/15/2008    Past Surgical History:  Procedure Laterality Date  . COLONOSCOPY    . HERNIA REPAIR     as a baby- not sure where hernia was  . KNEE SURGERY Left 1972   .  has hardware  . LUNG LOBECTOMY     RUL  . MICROLARYNGOSCOPY WITH CO2 LASER AND EXCISION OF VOCAL CORD LESION N/A 12/19/2017   Procedure: MICROLARYNGOSCOPY WITH BIOPSY;  Surgeon: Melida Quitter, MD;  Location: [facility];  Service: ENT;  Laterality: N/A;  . ROTATOR CUFF REPAIR Left   . SHOULDER SURGERY Right    rotator cuff  . THROAT SURGERY     Laser surgery for  throat cancer  . TRANSTHORACIC ECHOCARDIOGRAM  01/13/2012   EF=>55%; trace MR/TR;         Home Medications    Prior to Admission medications   Medication Sig Start Date End Date Taking? Authorizing Provider  albuterol (PROVENTIL HFA;VENTOLIN HFA) 108 (90 Base) MCG/ACT inhaler Inhale 1-2 puffs into the lungs every 6 (six) hours as needed for wheezing or shortness of breath.    [provider]  albuterol (PROVENTIL) (2.5 MG/3ML) 0.083% nebulizer solution Take 2.5 mg by nebulization 3 (three) times daily as needed for wheezing or shortness of breath.     [provider]  aspirin 81 MG chewable tablet  Chew 81 mg by mouth daily.    [provider]  Fluticasone-Umeclidin-Vilant (TRELEGY ELLIPTA) 100-62.5-25 MCG/INH AEPB Inhale 1 puff into the lungs daily. 08/13/17   Brand Males, MD  Fluticasone-Umeclidin-Vilant (TRELEGY ELLIPTA) 100-62.5-25 MCG/INH AEPB Inhale 1 puff into the lungs daily. Patient not taking: Reported on 06/13/2018 05/27/18   Brand Males, MD  ipratropium-albuterol (DUONEB) 0.5-2.5 (3) MG/3ML SOLN Take 3 mLs by nebulization every 6 (six) hours as needed. 06/13/18   Blanchie Dessert, MD  levothyroxine (SYNTHROID, LEVOTHROID) 125 MCG tablet Take 62.5 mcg by mouth daily before breakfast.     [provider]  metoprolol tartrate (LOPRESSOR) 50 MG tablet Take 1 tablet (50 mg total) by mouth once for 1 dose. Patient taking differently: Take 50 mg by mouth daily.  05/22/18 06/13/26  Richardson Dopp T, PA-C  mirtazapine (REMERON) 15 MG tablet Take 15 mg by mouth every evening. 04/15/18   [provider]  nitroGLYCERIN (NITROSTAT) 0.4 MG SL tablet Place 1 tablet (0.4 mg total) under the tongue every 5 (five) minutes as needed for chest pain. 08/09/13   Delfina Redwood, MD  omeprazole (PRILOSEC) 40 MG capsule Take 1 capsule (40 mg total) by mouth daily. 04/08/18   Ladene Artist, MD  pravastatin (PRAVACHOL) 40 MG tablet Take 40 mg by mouth at bedtime.     [provider]  predniSONE (DELTASONE) 20 MG tablet Take 3 tablets (60 mg total) by mouth daily. 06/13/18   Blanchie Dessert, MD  temazepam (RESTORIL) 30 MG capsule Take 30 mg by mouth at bedtime.     [provider]  Vitamin D, Ergocalciferol, (DRISDOL) 50000 UNITS CAPS Take 50,000 Units by mouth every 14 (fourteen) days.  05/07/11   [provider]    Family History Family History  Problem Relation Age of Onset  . Cancer Mother        Brain tumor  . COPD Mother   . Heart disease Father        CABG  . Prostate cancer Father   . Cystic fibrosis Sister        also heart  failure  . Mental illness Brother   . Heart failure Paternal Grandmother   . Colon cancer Neg Hx   . Colon polyps Neg Hx   . Esophageal cancer Neg Hx     Social History Social History   Tobacco Use  . Smoking status: Former Smoker    Packs/day: 1.50    Years: 32.00    Pack years: 48.00    Types: Cigarettes    Last attempt to quit: 12/30/2004    Years since quitting: 13.4  . Smokeless tobacco: Never Used  . Tobacco comment: 1 ppd x 30 years  Substance Use Topics  . Alcohol use: Yes    Comment: 1- 2 beers monthly  . Drug  use: No     Allergies   Codeine; Cymbalta [duloxetine hcl]; and Montelukast sodium   Review of Systems Review of Systems  Constitutional: Negative for chills and fever.  HENT: Negative.  Negative for congestion.   Respiratory: Positive for shortness of breath.   Cardiovascular: Negative.  Negative for chest pain.  Gastrointestinal: Negative.  Negative for abdominal pain, diarrhea and vomiting.  Musculoskeletal: Negative.  Negative for myalgias and neck stiffness.  Skin: Negative.   Neurological: Negative.      Physical Exam Updated Vital Signs BP 104/71 (BP Location: Right Arm)   Pulse 94   Temp 97.6 F (36.4 C) (Axillary)   Resp (!) 23   Ht 5\' 10"  (1.778 m)   Wt 73.9 kg (163 lb)   SpO2 100%   BMI 23.39 kg/m   Physical Exam  Constitutional: He is oriented to person, place, and time. He appears well-developed and well-nourished. He appears distressed (moderate respiratory distress).  HENT:  Head: Normocephalic.  Neck: Normal range of motion. Neck supple.  Cardiovascular: Normal rate and regular rhythm.  Pulmonary/Chest: Effort normal. Tachypnea noted. He has wheezes. He has rhonchi. He exhibits no tenderness.  Prolonged expirations.   Abdominal: Soft. Bowel sounds are normal. There is no tenderness. There is no rebound and no guarding.  Musculoskeletal: Normal range of motion.  Neurological: He is alert and oriented to person, place, and  time.  Skin: Skin is warm and dry. No rash noted.  Psychiatric: He has a normal mood and affect.     ED Treatments / Results  Labs (all labs ordered are listed, but only abnormal results are displayed) Labs Reviewed  CBC WITH DIFFERENTIAL/PLATELET  BASIC METABOLIC PANEL  TROPONIN I  BRAIN NATRIURETIC PEPTIDE   Results for orders placed or performed during the hospital encounter of 06/24/18  CBC with Differential/Platelet  Result Value Ref Range   WBC 15.5 (H) 4.0 - 10.5 K/uL   RBC 5.35 4.22 - 5.81 MIL/uL   Hemoglobin 14.4 13.0 - 17.0 g/dL   HCT 46.0 39.0 - 52.0 %   MCV 86.0 78.0 - 100.0 fL   MCH 26.9 26.0 - 34.0 pg   MCHC 31.3 30.0 - 36.0 g/dL   RDW 13.9 11.5 - 15.5 %   Platelets 264 150 - 400 K/uL   Neutrophils Relative % 76 %   Neutro Abs 11.8 (H) 1.7 - 7.7 K/uL   Lymphocytes Relative 11 %   Lymphs Abs 1.7 0.7 - 4.0 K/uL   Monocytes Relative 9 %   Monocytes Absolute 1.3 (H) 0.1 - 1.0 K/uL   Eosinophils Relative 2 %   Eosinophils Absolute 0.3 0.0 - 0.7 K/uL   Basophils Relative 1 %   Basophils Absolute 0.1 0.0 - 0.1 K/uL   Immature Granulocytes 1 %   Abs Immature Granulocytes 0.2 (H) 0.0 - 0.1 K/uL  Basic metabolic panel  Result Value Ref Range   Sodium 137 135 - 145 mmol/L   Potassium 3.4 (L) 3.5 - 5.1 mmol/L   Chloride 95 (L) 98 - 111 mmol/L   CO2 31 22 - 32 mmol/L   Glucose, Bld 116 (H) 70 - 99 mg/dL   BUN 9 8 - 23 mg/dL   Creatinine, Ser 0.83 0.61 - 1.24 mg/dL   Calcium 9.0 8.9 - 10.3 mg/dL   GFR calc non Af Amer >60 >60 mL/min   GFR calc Af Amer >60 >60 mL/min   Anion gap 11 5 - 15  Troponin I  Result Value  Ref Range   Troponin I <0.03 <0.03 ng/mL  Brain natriuretic peptide  Result Value Ref Range   B Natriuretic Peptide 61.4 0.0 - 100.0 pg/mL    EKG EKG Interpretation  Date/Time:  Wednesday June 24 2018 04:54:38 EDT Ventricular Rate:  98 PR Interval:    QRS Duration: 118 QT Interval:  364 QTC Calculation: 453 R Axis:   44 Text  Interpretation:  Sinus rhythm PVCs Aberrant conduction of SV complex(es) Biatrial enlargement Nonspecific intraventricular conduction delay Nonspecific T abnormalities, lateral leads No significant change was found Confirmed by Ezequiel Essex [phone]) on 06/24/2018 5:08:44 AM   Radiology No results found. Dg Chest Port 1 View  Result Date: 06/24/2018 CLINICAL DATA:  62 year old male with shortness of breath. EXAM: PORTABLE CHEST 1 VIEW COMPARISON:  Chest radiograph dated 06/13/2018 FINDINGS: Right apical postsurgical changes of lobectomy. There is associated volume loss in the right hemithorax. Emphysematous changes of the lungs. No new consolidative changes. There is no pleural effusion or pneumothorax. Stable cardiomediastinal silhouette. No acute osseous pathology. IMPRESSION: No active disease. Electronically Signed   By: Anner Crete M.D.   On: 06/24/2018 06:11   Dg Chest Portable 1 View  Result Date: 06/13/2018 CLINICAL DATA:  Shortness of breath. EXAM: PORTABLE CHEST 1 VIEW COMPARISON:  Radiograph 05/24/2018, CT 05/04/2018 FINDINGS: Chronic right lung volume loss with stable pleuroparenchymal opacity in surgical clips in the apex. Emphysema. Unchanged heart size and mediastinal contours. No new airspace disease, pleural effusion or pneumothorax. No pulmonary edema. IMPRESSION: 1. No acute findings. 2. Postsurgical change in the right hemithorax. 3. Emphysema. Electronically Signed   By: Jeb Levering M.D.   On: 06/13/2018 05:31    Procedures Procedures (including critical care time) CRITICAL CARE Performed by: Dewaine Oats   Total critical care time: 35 minutes  Critical care time was exclusive of separately billable procedures and treating other patients.  Critical care was necessary to treat or prevent imminent or life-threatening deterioration.  Critical care was time spent personally by me on the following activities: development of treatment plan with patient and/or  surrogate as well as nursing, discussions with consultants, evaluation of patient's response to treatment, examination of patient, obtaining history from patient or surrogate, ordering and performing treatments and interventions, ordering and review of laboratory studies, ordering and review of radiographic studies, pulse oximetry and re-evaluation of patient's condition.  Medications Ordered in ED Medications  albuterol (PROVENTIL,VENTOLIN) solution continuous neb (10 mg/hr Nebulization Given 06/24/18 0549)  ipratropium (ATROVENT) nebulizer solution 0.5 mg (0.5 mg Nebulization Given 06/24/18 0549)     Initial Impression / Assessment and Plan / ED Course  I have reviewed the triage vital signs and the nursing notes.  Pertinent labs & imaging results that were available during my care of the patient were reviewed by me and considered in my medical decision making (see chart for details).    Patient arrives via EMS for SOB. He reports longstanding problem but appears to be in acute respiratory distress with tachypnea and accessory muscle use. There are supraclavicular retractions. Marland Kitchen He received nebulizer and solumedrol in route, finishing his breathing treatment here. Off oxygen his saturation drops to mid-80's. He denies chest pain.  CAT ordered. He is felt to require Bipap as well. Maintains saturations through these treatments.  He continues to be tachypnea despite Bipap but states it helps him feel he is breathing better. Wife at bedside who is given details of treatment and plan for admission.   Discussed with hospitalist who  accepts the patient onto his service. Appreciate their help with this patient.    Final Clinical Impressions(s) / ED Diagnoses   Final diagnoses:  Pain   1. Acute respiratory failure with hypoxia.  ED Discharge Orders    None       Charlann Lange, Hershal Coria 06/24/18 0737    Ezequiel Essex, MD 06/24/18 [phone]

## 2018-06-24 NOTE — Discharge Instructions (Signed)

## 2018-06-24 NOTE — Progress Notes (Signed)
Paged Stephen Hearing NP to see if pt could have something for anxiety. Cont to monitor. Carroll Kinds RN

## 2018-06-24 NOTE — Progress Notes (Signed)
Initial Nutrition Assessment  DOCUMENTATION CODES:   Severe malnutrition in context of chronic illness  INTERVENTION:   -Ensure Enlive po TID, each supplement provides 350 kcal and 20 grams of protein -MVI with minerals daily  NUTRITION DIAGNOSIS:   Severe Malnutrition related to chronic illness(COPD) as evidenced by percent weight loss, moderate fat depletion, severe fat depletion, moderate muscle depletion, severe muscle depletion.  GOAL:   Patient will meet greater than or equal to 90% of their needs  MONITOR:   PO intake, Supplement acceptance, Labs, Weight trends, Skin, I & O's  REASON FOR ASSESSMENT:   Malnutrition Screening Tool, Consult Assessment of nutrition requirement/status  ASSESSMENT:   Stephen [name] is a 62 y.o. 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  Patient was last hospitalized May 2019 for COPD exacerbation with hypoxemia.  He returns to the ER complaining of increasing shortness of breath over several days.   Pt admitted with acute respiratory failure with hypoxia secondary to COPD.   Pt sleeping soundly with labored breathing at time of visit. Pt did not arouse when name was called.   Observed breakfast meal tray- pt consumed only 100% of milk and a few bites of grits this morning.   Reviewed wt hx; noted pt has experienced a 20% wt loss over the past 6 months, which is significant for time frame. Suspect increased energy expenditure related to COPD is contributing to weight loss.   Limited nutrition-focused physical exam completed, due to pt wearing thick denim pants at time of  assessment, so RD unable to assess lower extremities at this time.   Medications reviewed and include solu-medrol.   Labs reviewed: K: 3.4, Mg: 3.2.   NUTRITION - FOCUSED PHYSICAL EXAM:    Most Recent Value  Orbital Region  Severe depletion  Upper Arm Region  Moderate depletion  Thoracic and Lumbar Region  Unable to assess  Buccal Region  Moderate depletion  Temple Region  Severe depletion  Clavicle Bone Region  Severe depletion  Clavicle and Acromion Bone Region  Severe depletion  Scapular Bone Region  Severe depletion  Dorsal Hand  Moderate depletion  Patellar Region  Unable to assess  Anterior Thigh Region  Unable to assess  Posterior Calf Region  Unable to assess  Edema (RD Assessment)  None  Hair  Reviewed  Eyes  Reviewed  Mouth  Reviewed  Skin  Reviewed  Nails  Reviewed       Diet Order:   Diet Order           Diet regular Room service appropriate? Yes; Fluid consistency: Thin  Diet effective now          EDUCATION NEEDS:   Not appropriate for education at this time  Skin:  Skin Assessment: Reviewed RN Assessment  Last BM:  PTA  Height:   Ht Readings from Last 1 Encounters:  06/24/18 5\' 10"  (1.778 m)    Weight:   Wt Readings from Last 1 Encounters:  06/24/18 157 lb (71.2 kg)    Ideal Body Weight:  75.5 kg  BMI:  Body mass index is 22.53 kg/m.  Estimated Nutritional Needs:   Kcal:  2300-2500  Protein:  125-140 grams  Fluid:  2.3-2.5 L    Stephen [name] A. Jimmye Norman, RD, LDN, CDE Pager: [phone] After hours Pager: [phone]

## 2018-06-24 NOTE — Evaluation (Addendum)
Clinical/Bedside Swallow Evaluation Patient Details  Name: Stephen [name] MRN: [id number] Date of Birth: 02/20/1956  Today's Date: 06/24/2018 Time: SLP Start Time (ACUTE ONLY): 1004 SLP Stop Time (ACUTE ONLY): 1025 SLP Time Calculation (min) (ACUTE ONLY): 21 min  Past Medical History:  Past Medical History:  Diagnosis Date  . Allergic rhinitis, cause unspecified   . Anxiety   . Blindness of left eye   . Complication of anesthesia    difficulty awaking after colonoscopy   . Depression   . Dyspnea   . GERD (gastroesophageal reflux disease)   . Hypothyroidism   . Lung cancer ([place])    lung ca dx 06, has had chemo and radiation  . Osteoporosis   . Other emphysema ([place])    copd  . Throat cancer ([place])    throat ca dx 2007  . Thrombosed hemorrhoids   . Tubular adenoma of colon 2009  . Unspecified vitamin D deficiency    Past Surgical History:  Past Surgical History:  Procedure Laterality Date  . COLONOSCOPY    . HERNIA REPAIR     as a baby- not sure where hernia was  . KNEE SURGERY Left 1972   .  has hardware  . LUNG LOBECTOMY     RUL  . MICROLARYNGOSCOPY WITH CO2 LASER AND EXCISION OF VOCAL CORD LESION N/A 12/19/2017   Procedure: MICROLARYNGOSCOPY WITH BIOPSY;  Surgeon: Melida Quitter, MD;  Location: [facility];  Service: ENT;  Laterality: N/A;  . ROTATOR CUFF REPAIR Left   . SHOULDER SURGERY Right    rotator cuff  . THROAT SURGERY     Laser surgery for throat cancer  . TRANSTHORACIC ECHOCARDIOGRAM  01/13/2012   EF=>55%; trace MR/TR;    HPI:  Pt is a 62 y.o. 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  Patient was last hospitalized May 2019 for  COPD exacerbation with hypoxemia.  He returns to the ER complaining of increasing shortness of breath over several days. Per GI note in April, pt had been experiencing difficulty swallowing but tolerating soft diet "fairly well". Esophagram showed generalized decreased motility likely postradiation effect, GERD. Bedside swallow eval ordered on this admission, no previous speech therapy noted. CXR no active disease.  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 Pt has premorbid significant hoarse vocal quality from partial laryngectomy. Delayed coughing and throat clears noted. Intake has been low. Recommend objective swallow assessment with FEES or MBS.   SLP Visit Diagnosis: Dysphagia, unspecified (R13.10)    Aspiration Risk  Mild aspiration risk;Moderate aspiration risk    Diet Recommendation Regular;Thin liquid   Liquid Administration via: Cup;No straw Medication Administration: Whole meds with puree Supervision: Patient able to self feed Compensations: Slow rate;Small sips/bites Postural Changes: Seated upright at 90 degrees    Other  Recommendations Oral Care Recommendations: Oral care BID   Follow up Recommendations Other (comment)(TBD)      Frequency and Duration min 2x/week  2 weeks       Prognosis Prognosis for Safe Diet Advancement: Good  Swallow Study   General HPI: Pt is a 62 y.o. 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  Patient was last hospitalized May 2019 for COPD exacerbation with hypoxemia.  He returns to the ER complaining of increasing shortness of breath over several days. Per GI note in April, pt had been experiencing difficulty swallowing but tolerating soft diet "fairly well". Esophagram showed generalized decreased motility likely postradiation effect, GERD. Bedside swallow eval ordered on this admission, no previous speech therapy noted.  Type of Study: Bedside Swallow Evaluation Previous Swallow Assessment: (none at [facility]) Diet Prior to this Study: Regular;Thin liquids Temperature Spikes Noted: No Respiratory Status: Room air History of Recent Intubation: No Behavior/Cognition: Alert;Cooperative;Pleasant mood Oral Cavity Assessment: Within Functional Limits Oral Care Completed by SLP: No Oral Cavity - Dentition: Adequate natural dentition Vision: Functional for self-feeding Self-Feeding Abilities: Able to feed self Patient Positioning: Upright in bed Baseline Vocal Quality: Hoarse Volitional Cough: Strong Volitional Swallow: Able to elicit    Oral/Motor/Sensory Function Overall Oral Motor/Sensory Function: Within functional limits   Ice Chips Ice chips: Not tested   Thin Liquid Thin Liquid: Impaired Presentation: Cup;Straw Pharyngeal  Phase Impairments: Cough - Delayed    Nectar Thick Nectar Thick Liquid: Not tested   Honey Thick Honey Thick Liquid: Not tested   Puree Puree: Within functional limits   Solid   GO   Solid: Within functional limits        Stephen [name] 06/24/2018,10:45 AM   Stephen [name] Stephen [name] Stephen [name].Ed [employer] [phone]

## 2018-06-24 NOTE — Progress Notes (Signed)
Called to come assess patient by nurse due to shortness of breath.  Pt received a neb treatment less than 2 hours ago.  I suggested to patient to try to go on BIPAP for a short time to see if this helps but he refused and states he can not tolerate the bipap.  After talking more with the patient he feels its more of an anxiety issue. I spoke with the nurse and made her aware.  RT will continue to monitor.

## 2018-06-24 NOTE — ED Triage Notes (Signed)
Pt coming from home by ems due to SOB for the last 24. Pt was on steriods and pt stopped taking medication due to feeling better but did not finish the treatment. Pt was given by ems 10 mg albuterol, 1 mg Atrovent, 125 solumedrol, 2mg  of mag, and 4mg  of zofran.

## 2018-06-24 NOTE — ED Notes (Signed)
Attempted report x1. 

## 2018-06-25 DIAGNOSIS — E43 Unspecified severe protein-calorie malnutrition: Secondary | ICD-10-CM

## 2018-06-25 LAB — CBC
HEMATOCRIT: 43.9 % (ref 39.0–52.0)
Hemoglobin: 13.8 g/dL (ref 13.0–17.0)
MCH: 27 pg (ref 26.0–34.0)
MCHC: 31.4 g/dL (ref 30.0–36.0)
MCV: 85.7 fL (ref 78.0–100.0)
PLATELETS: 254 10*3/uL (ref 150–400)
RBC: 5.12 MIL/uL (ref 4.22–5.81)
RDW: 13.9 % (ref 11.5–15.5)
WBC: 23.5 10*3/uL — AB (ref 4.0–10.5)

## 2018-06-25 LAB — BASIC METABOLIC PANEL
Anion gap: 5 (ref 5–15)
BUN: 15 mg/dL (ref 8–23)
CHLORIDE: 96 mmol/L — AB (ref 98–111)
CO2: 40 mmol/L — ABNORMAL HIGH (ref 22–32)
Calcium: 8.6 mg/dL — ABNORMAL LOW (ref 8.9–10.3)
Creatinine, Ser: 0.71 mg/dL (ref 0.61–1.24)
GFR calc Af Amer: >60 mL/min (ref >60)
GFR calc non Af Amer: >60 mL/min (ref >60)
Glucose, Bld: 159 mg/dL — ABNORMAL HIGH (ref 70–99)
Potassium: 4.5 mmol/L (ref 3.5–5.1)
SODIUM: 141 mmol/L (ref 135–145)

## 2018-06-25 MED ORDER — UMECLIDINIUM BROMIDE 62.5 MCG/INH IN AEPB
1.0000 | INHALATION_SPRAY | Freq: Every day | RESPIRATORY_TRACT | Status: DC
  Administered 2018-06-25: 1 via RESPIRATORY_TRACT
  Filled 2018-06-25: qty 7

## 2018-06-25 MED ORDER — FLUTICASONE FUROATE-VILANTEROL 100-25 MCG/INH IN AEPB
1.0000 | INHALATION_SPRAY | Freq: Every day | RESPIRATORY_TRACT | Status: DC
  Administered 2018-06-25: 1 via RESPIRATORY_TRACT
  Filled 2018-06-25: qty 28

## 2018-06-25 MED ORDER — IPRATROPIUM-ALBUTEROL 0.5-2.5 (3) MG/3ML IN SOLN
3.0000 mL | Freq: Three times a day (TID) | RESPIRATORY_TRACT | Status: DC
  Administered 2018-06-25 (×2): 3 mL via RESPIRATORY_TRACT
  Filled 2018-06-25 (×2): qty 3

## 2018-06-25 NOTE — Consult Note (Signed)
Reason for Consult: Breathing difficulty Referring Physician: Hospitalists  Stephen [name] is an 62 y.o. male.  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  He was seen by Dr. Nicolette Bang at [facility] ten days ago and the consideration of elective tracheostomy was discussed.  The patient has had several episodes since surgery of acute respiratory distress that may be anxiety-related but have required EMS trips to the ER and some hospitalizations.  This happened again yesterday morning and he was admitted.  Medical therapy rendered has improved his breathing once again and he feels back to baseline.  He has underlying COPD that is contributing.  Past Medical History:  Diagnosis Date  . Allergic rhinitis, cause unspecified   . Anxiety   . Blindness of left eye   . Complication of anesthesia    difficulty awaking after colonoscopy   . Depression   . Dyspnea   . GERD (gastroesophageal reflux disease)   . Hypothyroidism   . Lung cancer ([place])    lung ca dx 06, has had chemo and radiation  . Osteoporosis   . Other emphysema ([place])    copd  . 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 Thrombosed hemorrhoids   . Tubular adenoma of colon 2009  . Unspecified vitamin D deficiency     Past Surgical History:  Procedure Laterality Date  . COLONOSCOPY    . HERNIA REPAIR     as a baby- not sure where hernia was  . KNEE SURGERY Left 1972   .  has hardware  . LUNG LOBECTOMY     RUL  . MICROLARYNGOSCOPY WITH CO2 LASER AND EXCISION OF VOCAL CORD LESION N/A 12/19/2017   Procedure: MICROLARYNGOSCOPY WITH BIOPSY;  Surgeon: Melida Quitter, MD;  Location: [facility];  Service: ENT;  Laterality: N/A;  . ROTATOR CUFF REPAIR Left   . SHOULDER SURGERY Right    rotator cuff  . THROAT SURGERY     Laser surgery for  throat cancer  . TRANSTHORACIC ECHOCARDIOGRAM  01/13/2012   EF=>55%; trace MR/TR;     Family History  Problem Relation Age of Onset  . Cancer Mother        Brain tumor  . COPD Mother   . Heart disease Father        CABG  . Prostate cancer Father   . Cystic fibrosis Sister        also heart failure  . Mental illness Brother   . Heart failure Paternal Grandmother   . Colon cancer Neg Hx   . Colon polyps Neg Hx   . Esophageal cancer Neg Hx     Social History:  reports that he quit smoking about 13 years ago. His smoking use included cigarettes. He has a 48.00 pack-year smoking history. He has never used smokeless tobacco. He reports that he drinks alcohol. He reports that he does not use drugs.  Allergies:  Allergies  Allergen Reactions  . Codeine Other (See Comments)    Unknown. Mother told him he was allergic  . Cymbalta [Duloxetine Hcl] Nausea And Vomiting  . Montelukast Sodium Other (See Comments)    Causes sinusitis    Medications: I have reviewed the patient's current medications.  Results for orders placed or performed during the hospital encounter of 06/24/18 (  from the past 48 hour(s))  CBC with Differential/Platelet     Status: Abnormal   Collection Time: 06/24/18  5:51 AM  Result Value Ref [place]   WBC 15.5 (H) 4.0 - 10.5 K/uL   RBC 5.35 4.22 - 5.81 MIL/uL   Hemoglobin 14.4 13.0 - 17.0 g/dL   HCT 46.0 39.0 - 52.0 %   MCV 86.0 78.0 - 100.0 fL   MCH 26.9 26.0 - 34.0 pg   MCHC 31.3 30.0 - 36.0 g/dL   RDW 13.9 11.5 - 15.5 %   Platelets 264 150 - 400 K/uL   Neutrophils Relative % 76 %   Neutro Abs 11.8 (H) 1.7 - 7.7 K/uL   Lymphocytes Relative 11 %   Lymphs Abs 1.7 0.7 - 4.0 K/uL   Monocytes Relative 9 %   Monocytes Absolute 1.3 (H) 0.1 - 1.0 K/uL   Eosinophils Relative 2 %   Eosinophils Absolute 0.3 0.0 - 0.7 K/uL   Basophils Relative 1 %   Basophils Absolute 0.1 0.0 - 0.1 K/uL   Immature Granulocytes 1 %   Abs Immature Granulocytes 0.2 (H) 0.0 - 0.1 K/uL     Comment: Performed at [facility], [address] [place], [place] [address]  Basic metabolic panel     Status: Abnormal   Collection Time: 06/24/18  5:51 AM  Result Value Ref [place]   Sodium 137 135 - 145 mmol/L   Potassium 3.4 (L) 3.5 - 5.1 mmol/L   Chloride 95 (L) 98 - 111 mmol/L    Comment: Please note change in reference range.   CO2 31 22 - 32 mmol/L   Glucose, Bld 116 (H) 70 - 99 mg/dL    Comment: Please note change in reference range.   BUN 9 8 - 23 mg/dL    Comment: Please note change in reference range.   Creatinine, Ser 0.83 0.61 - 1.24 mg/dL   Calcium 9.0 8.9 - 10.3 mg/dL   GFR calc non Af Amer >60 >60 mL/min   GFR calc Af Amer >60 >60 mL/min    Comment: (NOTE) The eGFR has been calculated using the CKD EPI equation. This calculation has not been validated in all clinical situations. eGFR's persistently <60 mL/min signify possible Chronic Kidney Disease.    Anion gap 11 5 - 15    Comment: Performed at [facility], [address] [place], [place] [address]  Troponin I     Status: None   Collection Time: 06/24/18  5:51 AM  Result Value Ref [place]   Troponin I <0.03 <0.03 ng/mL    Comment: Performed at [facility], [address] [place], [place] [address]  Brain natriuretic peptide     Status: None   Collection Time: 06/24/18  5:51 AM  Result Value Ref [place]   B Natriuretic Peptide 61.4 0.0 - 100.0 pg/mL    Comment: Performed at [facility], [address] [place], [place] [address]  Magnesium     Status: Abnormal   Collection Time: 06/24/18  5:51 AM  Result Value Ref [place]   Magnesium 3.2 (H) 1.7 - 2.4 mg/dL    Comment: Performed at  Hospital Lab, [address] [place],  [address]  MRSA PCR Screening     Status: None   Collection Time: 06/24/18  5:29 PM  Result Value Ref [place]   MRSA by PCR NEGATIVE NEGATIVE    Comment:        The   GeneXpert MRSA Assay (FDA approved for NASAL specimens only), is one component of  a comprehensive MRSA colonization surveillance program. It is not intended to diagnose MRSA infection nor to guide or monitor treatment for MRSA infections. Performed at [facility], [address] [place], [place] [address]   Basic metabolic panel     Status: Abnormal   Collection Time: 06/25/18  5:18 AM  Result Value Ref [place]   Sodium 141 135 - 145 mmol/L   Potassium 4.5 3.5 - 5.1 mmol/L   Chloride 96 (L) 98 - 111 mmol/L    Comment: Please note change in reference range.   CO2 40 (H) 22 - 32 mmol/L   Glucose, Bld 159 (H) 70 - 99 mg/dL    Comment: Please note change in reference range.   BUN 15 8 - 23 mg/dL    Comment: Please note change in reference range.   Creatinine, Ser 0.71 0.61 - 1.24 mg/dL   Calcium 8.6 (L) 8.9 - 10.3 mg/dL   GFR calc non Af Amer >60 >60 mL/min   GFR calc Af Amer >60 >60 mL/min    Comment: (NOTE) The eGFR has been calculated using the CKD EPI equation. This calculation has not been validated in all clinical situations. eGFR's persistently <60 mL/min signify possible Chronic Kidney Disease.    Anion gap 5 5 - 15    Comment: Performed at [facility], [address] [place], [place] [address]  CBC     Status: Abnormal   Collection Time: 06/25/18  5:18 AM  Result Value Ref [place]   WBC 23.5 (H) 4.0 - 10.5 K/uL   RBC 5.12 4.22 - 5.81 MIL/uL   Hemoglobin 13.8 13.0 - 17.0 g/dL   HCT 43.9 39.0 - 52.0 %   MCV 85.7 78.0 - 100.0 fL   MCH 27.0 26.0 - 34.0 pg   MCHC 31.4 30.0 - 36.0 g/dL   RDW 13.9 11.5 - 15.5 %   Platelets 254 150 - 400 K/uL    Comment: Performed at [facility], [address] [place], [place] [address]    Dg Chest Port 1 View  Result Date: 06/24/2018 CLINICAL DATA:  61-year-old male with shortness of breath. EXAM: PORTABLE CHEST 1 VIEW COMPARISON:  Chest radiograph dated 06/13/2018 FINDINGS: Right apical postsurgical changes of lobectomy. There is associated volume loss in the right hemithorax. Emphysematous  changes of the lungs. No new consolidative changes. There is no pleural effusion or pneumothorax. Stable cardiomediastinal silhouette. No acute osseous pathology. IMPRESSION: No active disease. Electronically Signed   By: Arash  Radparvar M.D.   On: 06/24/2018 06:11    Review of Systems  Respiratory: Positive for shortness of breath.   All other systems reviewed and are negative.  Blood pressure 136/67, pulse 77, temperature (!) 97.3 F (36.3 C), temperature source Oral, resp. rate (!) 21, height 5' 10" (1.778 m), weight 154 lb 12.8 oz (70.2 kg), SpO2 94 %. Physical Exam  Constitutional: He is oriented to person, place, and time. He appears well-developed and well-nourished.  HENT:  Head: Normocephalic and atraumatic.  Right Ear: External ear normal.  Left Ear: External ear normal.  Nose: Nose normal.  Mouth/Throat: Oropharynx is clear and moist.  Eyes: Pupils are equal, round, and reactive to light. Conjunctivae and EOM are normal.  Neck: Normal range of motion. Neck supple.  Larynx and trach scars.  Cardiovascular: Normal rate.  Respiratory:  No respiratory distress.  Soft inspiratory stridor, particularly   while talking.  Elevates chin when he breathes in quickly.  Neurological: He is alert and oriented to person, place, and time. No cranial nerve deficit.  Skin: Skin is warm and dry.  Psychiatric: He has a normal mood and affect. His behavior is normal. Judgment and thought content normal.    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  See procedure note.  The laryngeal anatomy is unchanged compared to my last evaluation in April and a picture from Dr. Waltonen's evaluation 10 days ago.  I also discussed the option of elective tracheostomy and the likelihood that this would help him avoid hospitalizations.  He remains reluctant to proceed, having had a tracheostomy before.   It seems that he will be able to be discharged when his medical team feels comfortable since his breathing has returned to comfortable.  I will have him follow-up with me in 1-2 weeks to continue discussion of options.  BATES, DWIGHT 06/25/2018, 1:48 PM     

## 2018-06-25 NOTE — Progress Notes (Signed)
Discharge order received.  Telemetry and iv removed by NT Rayburn Ma.  Discharge instructions reviewed with and given to patient.  Patient take to private vehicle via wheelchair, patient in no active distress.

## 2018-06-25 NOTE — Procedures (Signed)
Preop diagnosis: Stridor, laryngeal stenosis Postop diagnosis: same Procedure: Transnasal fiberoptic laryngoscopy Surgeon: Redmond Baseman Anesth: Topical with 4% lidocaine Compl: None Findings: Left hemilarynx immobile with no defined arytenoid or vocal fold.  Right vocal fold remains mobile.  Glottic opening is narrow but unchanged compared to recent examinations. Description:  After discussing risks, benefits, and alternatives, the patient was placed in a seated position and the right nasal passage was sprayed with topical anesthetic.  The fiberoptic scope was passed through the right nasal passage to view the pharynx and larynx.  Findings are noted above.  The scope was then removed and he was returned to nursing care in stable condition.

## 2018-06-25 NOTE — Progress Notes (Signed)
  Speech Language Pathology Treatment: Dysphagia  Patient Details Name: Stephen [name] MRN: [id number] DOB: 19-Oct-1956 Today's Date: 06/25/2018 Time: [id number] SLP Time Calculation (min) (ACUTE ONLY): 11 min  Assessment / Plan / Recommendation Clinical Impression  Plan was for FEES. When arrived to room rehab tech stated pt was declining FEES. SLP discussed with pt who spoke with Stephen [name] who scoped pt this afternoon and thought test was unnecessary. Pt states he has had 2 MBS in the past 3 months. SLP discussed therapists reasoning for recommending assessment now that several months have passed, SOB precipitating this admission, obvious decreased respiratory support and clinical s/s suspected penetration albeit min-mild. If he is penetrating, he may have adequate sensation to eject with throat clears however will be unable to assess as he is declining FEES. He has not has pna and may be protecting airway. Educated and reiterated to continue swallow precautions and reviewed them. Signing off.    HPI HPI: Pt is a 62 y.o. 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  Patient was last hospitalized May 2019 for COPD exacerbation with hypoxemia.  He returns to the ER complaining of increasing shortness of breath over several days. Per GI note in April, pt had been experiencing difficulty swallowing but tolerating soft diet "fairly well". Esophagram showed generalized decreased motility likely postradiation effect, GERD. Bedside swallow eval ordered on this admission, no previous speech therapy noted.       SLP Plan  All goals met       Recommendations  Diet  recommendations: Regular;Thin liquid Liquids provided via: Cup;Straw Medication Administration: Whole meds with puree Supervision: Patient able to self feed Compensations: Slow rate;Small sips/bites;Clear throat intermittently Postural Changes and/or Swallow Maneuvers: Seated upright 90 degrees                Oral Care Recommendations: Oral care BID Follow up Recommendations: None SLP Visit Diagnosis: Dysphagia, unspecified (R13.10) Plan: All goals met       GO                Houston Siren 06/25/2018, 3:31 PM  Orbie Pyo Dorthy Hustead M.Ed [employer] [phone]

## 2018-06-25 NOTE — Discharge Summary (Signed)
Discharge Summary  Stephen [name] DZH:[id number] DOB: 1956-11-07  PCP: [name], MD  Admit date: 06/24/2018 Discharge date: 06/25/2018  Time spent: <65mins  Recommendations for Outpatient Follow-up:  1. F/u with PMD within a week  for hospital discharge follow up, repeat cbc/bmp at follow up 2. F/u with ENT Dr Redmond Baseman in 1-2 weeks 3. F/u with pulmonology ( for copd) 4. F/u with cardiology ( per chart review , cardiology plan for elective cardiac cath in the next few weeks)  No new meds, continue prednisone to finish total of 7 days treatment.  Discharge Diagnoses:  Active Hospital Problems   Diagnosis Date Noted  . Acute respiratory failure with hypoxia ([place]) 05/04/2018  . Protein-calorie malnutrition, severe 06/25/2018  . Acute exacerbation of chronic obstructive pulmonary disease (COPD) ([place]) 06/24/2018  . Hypothyroidism 06/24/2018  . Esophageal dysmotility 06/24/2018  . GERD (gastroesophageal reflux disease) 06/24/2018  . History of lung cancer and throat cancer 06/24/2018  . FTT (failure to thrive) in adult 06/24/2018  . Abnormal echocardiogram 06/24/2018    Resolved Hospital Problems  No resolved problems to display.    Discharge Condition: stable  Diet recommendation: regular diet  Filed Weights   06/24/18 0456 06/24/18 0825 06/25/18 0557  Weight: 73.9 kg (163 lb) 71.2 kg (157 lb) 70.2 kg (154 lb 12.8 oz)    History of present illness:  PCP: [name], MD   Attending physician: Lorin Mercy  Patient coming from/Resides with: Private residence/wife  Chief Complaint: Shortness of breath  HPI: Stephen [name] is a 62 y.o. 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  Patient was last hospitalized May 2019 for COPD exacerbation with hypoxemia.  He returns to the ER complaining of increasing shortness of breath over several days.  He was started this past Sunday on prednisone but felt he was not improving and stopped taking this medication after 2 days.  He has utilized his home inhalers and nebulizers without improvement in symptoms.  He had significant increased work of breathing upon presentation to the ER and therefore was placed on BiPAP with significant improvement in his symptoms.  Chest x-ray was unremarkable.  Patient did have leukocytosis without fever in the context of recent prednisone usage and this is likely secondary to stress demargination.  ED Course:  Vital Signs: BP (!) 140/95   Pulse (!) 112   Temp 97.6 F (36.4 C) (Axillary)   Resp (!) 22   Ht 5\' 10"  (1.778 m)   Wt 73.9 kg (163 lb)   SpO2 97%   BMI 23.39 kg/m  CXR: Neg Lab data: Sodium 137, potassium 3.4, chloride 95, CO2 31, glucose 116, BUN 9, creatinine 0.83, calcium 9.0, anion gap 11, BNP 61, troponin normal, white count 15,500 with neutrophils 76%, absolute neutrophils 11.8%, hemoglobin 14.4, platelets 264,000 Medications and treatments: Per EMS: Albuterol continuous neb 10 mg x 1, Atrovent 1 mg x 1, Solu-Medrol 125 mg IV x1, magnesium 2 g IV x1 and Zofran 4 mg IV x1; per EDP: Continuous albuterol neb 10 mg x 1, Atrovent neb 0.5 mg x 1     Hospital Course:  Principal Problem:   Acute respiratory failure with hypoxia (HCC) Active Problems:  Acute exacerbation of chronic obstructive pulmonary disease (COPD) (HCC)   Hypothyroidism   Esophageal dysmotility   GERD (gastroesophageal reflux disease)   History of lung cancer and throat cancer   FTT (failure to thrive) in adult   Abnormal echocardiogram   Protein-calorie malnutrition, severe  Stridor, laryngeal stenosis ENT Dr Redmond Baseman input  appreciated, s/p Transnasal fiberoptic laryngoscopy today Patient is currently reluctant to proceed tracheostomy, his breathing has retunred to comfortable, no wheezing, no stridor, continue to be hoarse. He is cleared to discharge home by ENT with close follow up in 1-2 weeks with Dr Redmond Baseman. He has a prescription of prednisone for 7 days prior to be admitted this time, he is to continue to take it to finish the course.  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    Procedures:  *  Consultations:  *  Discharge Exam: BP 136/67 (BP Location: Right Arm)   Pulse 77   Temp (!) 97.3 F (36.3 C) (Oral)   Resp (!) 21   Ht 5\' 10"  (1.778 m)   Wt 70.2 kg (154 lb 12.8 oz)   SpO2 95%   BMI 22.21 kg/m   General: * Cardiovascular: * Respiratory: *  Discharge Instructions You were cared for by a hospitalist during your hospital stay. If you have any questions about your discharge medications or the care you received while you were in the hospital after you are discharged, you can call the unit and asked to speak with the hospitalist on call if the hospitalist that took care of you is not available. Once you are discharged, your primary care physician will handle any further medical issues. Please note that NO REFILLS for any discharge medications will be authorized once you are discharged, as it is imperative that you return to your primary care physician (or establish a relationship with a primary care physician if you do not have one) for your aftercare needs so that they can reassess your need for  medications and monitor your lab values.  Discharge Instructions    Diet general   Complete by:  As directed    Increase activity slowly   Complete by:  As directed      Allergies as of 06/25/2018      Reactions   Codeine Other (See Comments)   Unknown. Mother told him he was allergic   Cymbalta [duloxetine Hcl] Nausea And Vomiting   Montelukast Sodium Other (See Comments)   Causes sinusitis      Medication List    STOP taking these medications   metoprolol tartrate 50 MG tablet Commonly known as:  LOPRESSOR     TAKE these medications   albuterol 108 (90 Base) MCG/ACT inhaler Commonly known as:  PROVENTIL HFA;VENTOLIN HFA Inhale 1-2 puffs into the lungs every 6 (six) hours as needed for wheezing or shortness of breath.   albuterol (2.5 MG/3ML) 0.083% nebulizer solution Commonly known as:  PROVENTIL Take 2.5 mg by nebulization 3 (three) times daily as needed for wheezing or shortness of breath.   aspirin 81 MG chewable tablet Chew 81 mg by mouth  daily.   Fluticasone-Umeclidin-Vilant 100-62.5-25 MCG/INH Aepb Commonly known as:  TRELEGY ELLIPTA Inhale 1 puff into the lungs daily.   Fluticasone-Umeclidin-Vilant 100-62.5-25 MCG/INH Aepb Commonly known as:  TRELEGY ELLIPTA Inhale 1 puff into the lungs daily.   ipratropium-albuterol 0.5-2.5 (3) MG/3ML Soln Commonly known as:  DUONEB Take 3 mLs by nebulization every 6 (six) hours as needed. What changed:  reasons to take this   levothyroxine 125 MCG tablet Commonly known as:  SYNTHROID, LEVOTHROID Take 62.5 mcg by mouth daily before breakfast.   mirtazapine 15 MG tablet Commonly known as:  REMERON Take 15 mg by mouth at bedtime as needed (sleep).   nitroGLYCERIN 0.4 MG SL tablet Commonly known as:  NITROSTAT Place 1 tablet (0.4 mg total) under the tongue every 5 (five) minutes as needed for chest pain.   omeprazole 40 MG capsule Commonly known as:  PRILOSEC Take 1 capsule (40 mg total) by mouth daily.     pravastatin 40 MG tablet Commonly known as:  PRAVACHOL Take 40 mg by mouth at bedtime.   predniSONE 20 MG tablet Commonly known as:  DELTASONE Take 3 tablets (60 mg total) by mouth daily.   temazepam 30 MG capsule Commonly known as:  RESTORIL Take 30 mg by mouth at bedtime.   Vitamin D (Ergocalciferol) 50000 units Caps capsule Commonly known as:  DRISDOL Take 50,000 Units by mouth every 14 (fourteen) days.      Allergies  Allergen Reactions  . Codeine Other (See Comments)    Unknown. Mother told him he was allergic  . Cymbalta [Duloxetine Hcl] Nausea And Vomiting  . Montelukast Sodium Other (See Comments)    Causes sinusitis   Follow-up Information    Melida Quitter, MD Follow up in 1 week(s).   Specialty:  Otolaryngology Contact information: [address] [place] [address] [phone]        Nahser, Wonda Cheng, MD Follow up.   Specialty:  Cardiology Why:  to discuss cardiac catheterization  Contact information: [place] [address] [place] [address] [phone]        [name], MD Follow up in 2 week(s).   Specialty:  Internal Medicine Why:  hospital discharge follow up Contact information: [place] [address] [phone]            The results of significant diagnostics from this hospitalization (including imaging, microbiology, ancillary and laboratory) are listed below for reference.    Significant Diagnostic Studies: Dg Chest Port 1 View  Result Date: 06/24/2018 CLINICAL DATA:  62 year old male with shortness of breath. EXAM: PORTABLE CHEST 1 VIEW COMPARISON:  Chest radiograph dated 06/13/2018 FINDINGS: Right apical postsurgical changes of lobectomy. There is associated volume loss in the right hemithorax. Emphysematous changes of the lungs. No new consolidative changes. There is no pleural effusion or pneumothorax. Stable cardiomediastinal silhouette. No acute osseous pathology.  IMPRESSION: No active disease. Electronically Signed   By: Anner Crete M.D.   On: 06/24/2018 06:11   Dg Chest Portable 1 View  Result Date: 06/13/2018 CLINICAL DATA:  Shortness of breath. EXAM: PORTABLE CHEST 1 VIEW COMPARISON:  Radiograph 05/24/2018, CT 05/04/2018 FINDINGS: Chronic right lung volume loss with stable pleuroparenchymal opacity in surgical clips in the apex. Emphysema. Unchanged heart size and mediastinal contours. No new airspace disease, pleural effusion or pneumothorax. No pulmonary edema. IMPRESSION: 1. No acute findings. 2. Postsurgical change in the right hemithorax. 3. Emphysema. Electronically Signed   By: Fonnie Birkenhead.D.  On: 06/13/2018 05:31    Microbiology: Recent Results (from the past 240 hour(s))  MRSA PCR Screening     Status: None   Collection Time: 06/24/18  5:29 PM  Result Value Ref Range Status   MRSA by PCR NEGATIVE NEGATIVE Final    Comment:        The GeneXpert MRSA Assay (FDA approved for NASAL specimens only), is one component of a comprehensive MRSA colonization surveillance program. It is not intended to diagnose MRSA infection nor to guide or monitor treatment for MRSA infections. Performed at [facility], [place] [address] [place], [place] [address]      Labs: Basic Metabolic Panel: Recent Labs  Lab 06/24/18 0551 06/25/18 0518  NA 137 141  K 3.4* 4.5  CL 95* 96*  CO2 31 40*  GLUCOSE 116* 159*  BUN 9 15  CREATININE 0.83 0.71  CALCIUM 9.0 8.6*  MG 3.2*  --    Liver Function Tests: No results for input(s): AST, ALT, ALKPHOS, BILITOT, PROT, ALBUMIN in the last 168 hours. No results for input(s): LIPASE, AMYLASE in the last 168 hours. No results for input(s): AMMONIA in the last 168 hours. CBC: Recent Labs  Lab 06/24/18 0551 06/25/18 0518  WBC 15.5* 23.5*  NEUTROABS 11.8*  --   HGB 14.4 13.8  HCT 46.0 43.9  MCV 86.0 85.7  PLT 264 254   Cardiac Enzymes: Recent Labs  Lab 06/24/18 0551  TROPONINI <0.03    BNP: BNP (last 3 results) Recent Labs    06/24/18 0551  BNP 61.4    ProBNP (last 3 results) No results for input(s): PROBNP in the last 8760 hours.  CBG: No results for input(s): GLUCAP in the last 168 hours.     Signed:  Florencia Reasons MD, PhD  [facility] 06/25/2018, 9:45 PM

## 2018-06-26 ENCOUNTER — Telehealth: Payer: Self-pay | Admitting: Physician Assistant

## 2018-06-26 NOTE — Telephone Encounter (Signed)
Pt called today and message was sent to me stating pt waiting to hear when cath is being scheduled. Pt saw Richardson Dopp, [place] 05/22/18 and a Coronary CT w/FFR was ordered at appt with PA. Pt is not scheduled to have a cath. I apologized to the pt that he has not yet been scheduled for his CT. Advised it will send a message to the scheduler for the Coronary CT and have them call him today. Order was placed 05/22/18. Pt thanked me for the call back.

## 2018-06-26 NOTE — Telephone Encounter (Signed)
Pt has been scheduled for Ct for 7/11. Per D/C pt needs appt to discuss possible cath. Pt is being scheduled to see Pecolia Ades, NP 07/06/18.

## 2018-06-26 NOTE — Telephone Encounter (Signed)
New Message:    Pt says he waiting for a date for his Cath please.

## 2018-07-06 ENCOUNTER — Ambulatory Visit: Payer: 59 | Admitting: Cardiology

## 2018-07-06 DIAGNOSIS — R0989 Other specified symptoms and signs involving the circulatory and respiratory systems: Secondary | ICD-10-CM

## 2018-07-06 NOTE — Progress Notes (Deleted)
Cardiology Office Note:    Date:  07/06/2018   ID:  ADEEB KONECNY, DOB 08/10/1956, MRN [id number]  PCP:  [name], MD  Cardiologist:  Mertie Moores, MD  Referring MD: [name], MD   No chief complaint on file. ***  History of Present Illness:    Stephen [name] is a 62 y.o. male with a past medical history significant for lung, throat and R airway cancer s/p radiation and partial resection, tobacco abuse, COPD, hypertension, hyperlipidemia.  He was evaluated remotely by Dr. Debara Pickett in 2014 for chest pain.  A Nuclear stress test was normal.  He had recurrence of cancer and underwent partial laryngectomy in February 2019.    He was admitted 5/6-05/07/18 with progressive shortness of breath.  He was managed for COPD exacerbation.  He had minimally elevated troponin levels thought to be related to demand ischemia.  Echocardiogram demonstrated normal LV function with mild diastolic dysfunction and inferior hypokinesis.  There was a trivial pericardial effusion.  He was seen by cardiology.  Nuclear stress test demonstrated large fixed inferior defect.  There was no ischemia.  As he was not having chest pain, it was felt that medical therapy could be continued.  Cardiac catheterization will need to be considered if he had symptoms of chest discomfort.  He was seen at hospital follow-up by Richardson Dopp, PA at which time he was continuing to feel poorly and fatigued with dyspnea on exertion.  He was experiencing a tremendous amount of stress.  He was ordered a cardiac CTA which is scheduled for 07/09/2018.   --------------------------------  CAD: Presumed CAD by recent nuclear stress test demonstrating inferior scar but no ischemia.  The patient continued to have progressive shortness of breath which seemed to be more related to COPD and tracheal stenosis from his recent cancer surgery but he does have occasional atypical left-sided chest discomfort.  Chronic obstructive pulmonary disease:  Followed by pulmonology  Malignant neoplasm of the glottis: Followed by ENT  Hyperlipidemia: Continue statin  Past Medical History:  Diagnosis Date  . Allergic rhinitis, cause unspecified   . Anxiety   . Blindness of left eye   . Complication of anesthesia    difficulty awaking after colonoscopy   . Depression   . Dyspnea   . GERD (gastroesophageal reflux disease)   . Hypothyroidism   . Lung cancer ([place])    lung ca dx 06, has had chemo and radiation  . Osteoporosis   . Other emphysema ([place])    copd  . Throat cancer ([place])    throat ca dx 2007  . Thrombosed hemorrhoids   . Tubular adenoma of colon 2009  . Unspecified vitamin D deficiency     Past Surgical History:  Procedure Laterality Date  . COLONOSCOPY    . HERNIA REPAIR     as a baby- not sure where hernia was  . KNEE SURGERY Left 1972   .  has hardware  . LUNG LOBECTOMY     RUL  . MICROLARYNGOSCOPY WITH CO2 LASER AND EXCISION OF VOCAL CORD LESION N/A 12/19/2017   Procedure: MICROLARYNGOSCOPY WITH BIOPSY;  Surgeon: Melida Quitter, MD;  Location: [facility];  Service: ENT;  Laterality: N/A;  . ROTATOR CUFF REPAIR Left   . SHOULDER SURGERY Right    rotator cuff  . THROAT SURGERY     Laser surgery for throat cancer  . TRANSTHORACIC ECHOCARDIOGRAM  01/13/2012   EF=>55%; trace MR/TR;     Current Medications: No outpatient medications  have been marked as taking for the 07/06/18 encounter (Appointment) with Daune Perch, NP.     Allergies:   Codeine; Cymbalta [duloxetine hcl]; and Montelukast sodium   Social History   Socioeconomic History  . Marital status: Single    Spouse name: Not on file  . Number of children: 0  . Years of education: Not on file  . Highest education level: Not on file  Occupational History  . Occupation: Disabled    Fish farm manager: UNEMPLOYED  Social Needs  . Financial resource strain: Not on file  . Food insecurity:    Worry: Not on file    Inability: Not on file  . Transportation needs:      Medical: Not on file    Non-medical: Not on file  Tobacco Use  . Smoking status: Former Smoker    Packs/day: 1.50    Years: 32.00    Pack years: 48.00    Types: Cigarettes    Last attempt to quit: 12/30/2004    Years since quitting: 13.5  . Smokeless tobacco: Never Used  . Tobacco comment: 1 ppd x 30 years  Substance and Sexual Activity  . Alcohol use: Yes    Comment: 1- 2 beers monthly  . Drug use: No  . Sexual activity: Not on file  Lifestyle  . Physical activity:    Days per week: Not on file    Minutes per session: Not on file  . Stress: Not on file  Relationships  . Social connections:    Talks on phone: Not on file    Gets together: Not on file    Attends religious service: Not on file    Active member of club or organization: Not on file    Attends meetings of clubs or organizations: Not on file    Relationship status: Not on file  Other Topics Concern  . Not on file  Social History Narrative  . Not on file     Family History: The patient's ***family history includes COPD in his mother; Cancer in his mother; Cystic fibrosis in his sister; Heart disease in his father; Heart failure in his paternal grandmother; Mental illness in his brother; Prostate cancer in his father. There is no history of Colon cancer, Colon polyps, or Esophageal cancer. ROS:   Please see the history of present illness.    *** All other systems reviewed and are negative.  EKGs/Labs/Other Studies Reviewed:    The following studies were reviewed today:   Nuclear stress test 05/07/2018 IMPRESSION: 1. Large fixed inferior wall defect. No significant reversible ischemia or infarction. 2. Inferior wall hypokinesis and septal dyskinesis 3. Left ventricular ejection fraction 61% 4. Non invasive risk stratification*: Intermediate  Echo 05/06/2018 Mild LVH, EF 55-60, inferior HK, grade 1 diastolic dysfunction, aortic sclerosis, no aortic stenosis, MAC, trivial MR, trivial pericardial  effusion  Chest CTA 05/04/2018 IMPRESSION: 1. No acute intrathoracic pathology. No CT evidence of pulmonary embolism. 2. Postsurgical changes of right upper lobectomy as seen on the prior CT. No new masses or evidence of recurrent disease. 3. COPD. 4. Small pericardial effusion, increased compared to the prior chest CT.  Nuclear stress test 08/17/2013 Overall Impression: Low risk stress nuclear study with bowel artifact obscuring the inferoseptal wall. No evidence of ischemia or infarction.  EF 72  Echo 08/09/2013 EF 55-60  Carotid US 01/13/2012 No significant ICA stenosis   EKG:  EKG is *** ordered today.  The ekg ordered today demonstrates ***  Recent Labs: 06/24/2018: B  Natriuretic Peptide 61.4; Magnesium 3.2 06/25/2018: BUN 15; Creatinine, Ser 0.71; Hemoglobin 13.8; Platelets 254; Potassium 4.5; Sodium 141   Recent Lipid Panel No results found for: CHOL, TRIG, HDL, CHOLHDL, VLDL, LDLCALC, LDLDIRECT  Physical Exam:    VS:  There were no vitals taken for this visit.    Wt Readings from Last 3 Encounters:  06/25/18 154 lb 12.8 oz (70.2 kg)  05/27/18 164 lb (74.4 kg)  05/24/18 166 lb (75.3 kg)     Physical Exam***   ASSESSMENT:    1. Coronary artery disease involving native coronary artery of native heart without angina pectoris   2. Chronic obstructive pulmonary disease, unspecified COPD type ([place])   3. Malignant neoplasm of glottis (HCC)    PLAN:    In order of problems listed above:  1. ***   Medication Adjustments/Labs and Tests Ordered: Current medicines are reviewed at length with the patient today.  Concerns regarding medicines are outlined above. Labs and tests ordered and medication changes are outlined in the patient instructions below:  There are no Patient Instructions on file for this visit.   Signed, Daune Perch, NP  07/06/2018 6:09 AM    [facility]

## 2018-07-09 ENCOUNTER — Ambulatory Visit (HOSPITAL_COMMUNITY)
Admission: RE | Admit: 2018-07-09 | Discharge: 2018-07-09 | Disposition: A | Discharge status: Home / Self Care | Payer: 59 | Source: Ambulatory Visit | Attending: Physician Assistant | Admitting: Physician Assistant

## 2018-07-09 DIAGNOSIS — I2510 Atherosclerotic heart disease of native coronary artery without angina pectoris: Secondary | ICD-10-CM | POA: Insufficient documentation

## 2018-07-09 DIAGNOSIS — R918 Other nonspecific abnormal finding of lung field: Secondary | ICD-10-CM | POA: Diagnosis not present

## 2018-07-09 DIAGNOSIS — R079 Chest pain, unspecified: Secondary | ICD-10-CM | POA: Diagnosis not present

## 2018-07-09 MED ORDER — METOPROLOL TARTRATE 5 MG/5ML IV SOLN
5.0000 mg | INTRAVENOUS | Status: DC | PRN
  Administered 2018-07-09 (×2): 5 mg via INTRAVENOUS
  Filled 2018-07-09 (×4): qty 5

## 2018-07-09 MED ORDER — NITROGLYCERIN 0.4 MG SL SUBL
SUBLINGUAL_TABLET | SUBLINGUAL | Status: AC
  Filled 2018-07-09: qty 2

## 2018-07-09 MED ORDER — IOPAMIDOL (ISOVUE-370) INJECTION 76%
100.0000 mL | Freq: Once | INTRAVENOUS | Status: AC | PRN
  Administered 2018-07-09: 100 mL via INTRAVENOUS

## 2018-07-09 MED ORDER — SODIUM CHLORIDE 0.9 % IV BOLUS
500.0000 mL | Freq: Once | INTRAVENOUS | Status: AC
  Administered 2018-07-09: 500 mL via INTRAVENOUS

## 2018-07-09 MED ORDER — NITROGLYCERIN 0.4 MG SL SUBL
0.8000 mg | SUBLINGUAL_TABLET | SUBLINGUAL | Status: DC | PRN
  Administered 2018-07-09: 0.8 mg via SUBLINGUAL
  Filled 2018-07-09 (×3): qty 25

## 2018-07-09 MED ORDER — IOPAMIDOL (ISOVUE-370) INJECTION 76%
INTRAVENOUS | Status: AC
  Filled 2018-07-09: qty 100

## 2018-07-09 MED ORDER — METOPROLOL TARTRATE 5 MG/5ML IV SOLN
INTRAVENOUS | Status: AC
  Administered 2018-07-09: 5 mg
  Filled 2018-07-09: qty 5

## 2018-07-10 ENCOUNTER — Inpatient Hospital Stay (HOSPITAL_COMMUNITY)
Admission: EM | Admit: 2018-07-10 | Discharge: 2018-07-11 | DRG: 193 | Disposition: A | Discharge status: Home / Self Care | Payer: Medicare Other | Attending: Internal Medicine | Admitting: Internal Medicine

## 2018-07-10 ENCOUNTER — Other Ambulatory Visit: Payer: Self-pay

## 2018-07-10 ENCOUNTER — Encounter (HOSPITAL_COMMUNITY): Payer: Self-pay | Admitting: Internal Medicine

## 2018-07-10 DIAGNOSIS — E039 Hypothyroidism, unspecified: Secondary | ICD-10-CM | POA: Diagnosis present

## 2018-07-10 DIAGNOSIS — Z7951 Long term (current) use of inhaled steroids: Secondary | ICD-10-CM

## 2018-07-10 DIAGNOSIS — I2510 Atherosclerotic heart disease of native coronary artery without angina pectoris: Secondary | ICD-10-CM | POA: Diagnosis present

## 2018-07-10 DIAGNOSIS — F419 Anxiety disorder, unspecified: Secondary | ICD-10-CM | POA: Diagnosis present

## 2018-07-10 DIAGNOSIS — R627 Adult failure to thrive: Secondary | ICD-10-CM | POA: Diagnosis present

## 2018-07-10 DIAGNOSIS — Z902 Acquired absence of lung [part of]: Secondary | ICD-10-CM

## 2018-07-10 DIAGNOSIS — Z6822 Body mass index (BMI) 22.0-22.9, adult: Secondary | ICD-10-CM

## 2018-07-10 DIAGNOSIS — F329 Major depressive disorder, single episode, unspecified: Secondary | ICD-10-CM | POA: Diagnosis present

## 2018-07-10 DIAGNOSIS — J189 Pneumonia, unspecified organism: Secondary | ICD-10-CM | POA: Diagnosis not present

## 2018-07-10 DIAGNOSIS — Z923 Personal history of irradiation: Secondary | ICD-10-CM | POA: Diagnosis not present

## 2018-07-10 DIAGNOSIS — Z66 Do not resuscitate: Secondary | ICD-10-CM | POA: Diagnosis present

## 2018-07-10 DIAGNOSIS — Z79890 Hormone replacement therapy (postmenopausal): Secondary | ICD-10-CM

## 2018-07-10 DIAGNOSIS — M810 Age-related osteoporosis without current pathological fracture: Secondary | ICD-10-CM | POA: Diagnosis present

## 2018-07-10 DIAGNOSIS — J449 Chronic obstructive pulmonary disease, unspecified: Secondary | ICD-10-CM | POA: Diagnosis present

## 2018-07-10 DIAGNOSIS — Z888 Allergy status to other drugs, medicaments and biological substances status: Secondary | ICD-10-CM

## 2018-07-10 DIAGNOSIS — K219 Gastro-esophageal reflux disease without esophagitis: Secondary | ICD-10-CM | POA: Diagnosis present

## 2018-07-10 DIAGNOSIS — R062 Wheezing: Secondary | ICD-10-CM

## 2018-07-10 DIAGNOSIS — Z9221 Personal history of antineoplastic chemotherapy: Secondary | ICD-10-CM

## 2018-07-10 DIAGNOSIS — Z825 Family history of asthma and other chronic lower respiratory diseases: Secondary | ICD-10-CM

## 2018-07-10 DIAGNOSIS — Y95 Nosocomial condition: Secondary | ICD-10-CM | POA: Diagnosis present

## 2018-07-10 DIAGNOSIS — Z9981 Dependence on supplemental oxygen: Secondary | ICD-10-CM

## 2018-07-10 DIAGNOSIS — Z885 Allergy status to narcotic agent status: Secondary | ICD-10-CM

## 2018-07-10 DIAGNOSIS — Z79899 Other long term (current) drug therapy: Secondary | ICD-10-CM

## 2018-07-10 DIAGNOSIS — Z85118 Personal history of other malignant neoplasm of bronchus and lung: Secondary | ICD-10-CM | POA: Diagnosis not present

## 2018-07-10 DIAGNOSIS — E43 Unspecified severe protein-calorie malnutrition: Secondary | ICD-10-CM | POA: Diagnosis present

## 2018-07-10 DIAGNOSIS — J9611 Chronic respiratory failure with hypoxia: Secondary | ICD-10-CM | POA: Diagnosis present

## 2018-07-10 DIAGNOSIS — H5462 Unqualified visual loss, left eye, normal vision right eye: Secondary | ICD-10-CM | POA: Diagnosis present

## 2018-07-10 DIAGNOSIS — E785 Hyperlipidemia, unspecified: Secondary | ICD-10-CM | POA: Diagnosis present

## 2018-07-10 DIAGNOSIS — I10 Essential (primary) hypertension: Secondary | ICD-10-CM | POA: Diagnosis present

## 2018-07-10 DIAGNOSIS — J309 Allergic rhinitis, unspecified: Secondary | ICD-10-CM | POA: Diagnosis present

## 2018-07-10 DIAGNOSIS — C320 Malignant neoplasm of glottis: Secondary | ICD-10-CM | POA: Diagnosis present

## 2018-07-10 DIAGNOSIS — Z8249 Family history of ischemic heart disease and other diseases of the circulatory system: Secondary | ICD-10-CM

## 2018-07-10 DIAGNOSIS — J440 Chronic obstructive pulmonary disease with acute lower respiratory infection: Secondary | ICD-10-CM | POA: Diagnosis present

## 2018-07-10 DIAGNOSIS — Z87891 Personal history of nicotine dependence: Secondary | ICD-10-CM

## 2018-07-10 DIAGNOSIS — Z9002 Acquired absence of larynx: Secondary | ICD-10-CM

## 2018-07-10 DIAGNOSIS — Z8521 Personal history of malignant neoplasm of larynx: Secondary | ICD-10-CM

## 2018-07-10 DIAGNOSIS — J386 Stenosis of larynx: Secondary | ICD-10-CM | POA: Diagnosis present

## 2018-07-10 DIAGNOSIS — R079 Chest pain, unspecified: Secondary | ICD-10-CM | POA: Diagnosis not present

## 2018-07-10 DIAGNOSIS — J180 Bronchopneumonia, unspecified organism: Secondary | ICD-10-CM | POA: Diagnosis present

## 2018-07-10 DIAGNOSIS — I959 Hypotension, unspecified: Secondary | ICD-10-CM | POA: Diagnosis present

## 2018-07-10 DIAGNOSIS — Z85819 Personal history of malignant neoplasm of unspecified site of lip, oral cavity, and pharynx: Secondary | ICD-10-CM

## 2018-07-10 DIAGNOSIS — Z7982 Long term (current) use of aspirin: Secondary | ICD-10-CM

## 2018-07-10 LAB — CBC WITH DIFFERENTIAL/PLATELET
Abs Immature Granulocytes: 0.1 10*3/uL (ref 0.0–0.1)
BASOS ABS: 0.1 10*3/uL (ref 0.0–0.1)
Basophils Relative: 1 %
EOS ABS: 0.1 10*3/uL (ref 0.0–0.7)
Eosinophils Relative: 1 %
HEMATOCRIT: 44.8 % (ref 39.0–52.0)
Hemoglobin: 13.4 g/dL (ref 13.0–17.0)
Immature Granulocytes: 1 %
Lymphocytes Relative: 13 %
Lymphs Abs: 1.3 10*3/uL (ref 0.7–4.0)
MCH: 26.7 pg (ref 26.0–34.0)
MCHC: 29.9 g/dL — AB (ref 30.0–36.0)
MCV: 89.4 fL (ref 78.0–100.0)
Monocytes Absolute: 0.9 10*3/uL (ref 0.1–1.0)
Monocytes Relative: 9 %
Neutro Abs: 7.4 10*3/uL (ref 1.7–7.7)
Neutrophils Relative %: 75 %
Platelets: 269 10*3/uL (ref 150–400)
RBC: 5.01 MIL/uL (ref 4.22–5.81)
RDW: 14.1 % (ref 11.5–15.5)
WBC: 9.8 10*3/uL (ref 4.0–10.5)

## 2018-07-10 LAB — COMPREHENSIVE METABOLIC PANEL
ALBUMIN: 3.3 g/dL — AB (ref 3.5–5.0)
ALT: 17 U/L (ref 0–44)
ANION GAP: 12 (ref 5–15)
AST: 16 U/L (ref 15–41)
Alkaline Phosphatase: 104 U/L (ref 38–126)
BUN: 5 mg/dL — AB (ref 8–23)
CHLORIDE: 92 mmol/L — AB (ref 98–111)
CO2: 36 mmol/L — AB (ref 22–32)
Calcium: 9.2 mg/dL (ref 8.9–10.3)
Creatinine, Ser: 0.8 mg/dL (ref 0.61–1.24)
GFR calc Af Amer: >60 mL/min (ref >60)
GFR calc non Af Amer: >60 mL/min (ref >60)
GLUCOSE: 76 mg/dL (ref 70–99)
POTASSIUM: 3.6 mmol/L (ref 3.5–5.1)
Sodium: 140 mmol/L (ref 135–145)
TOTAL PROTEIN: 6.5 g/dL (ref 6.5–8.1)
Total Bilirubin: 1 mg/dL (ref 0.3–1.2)

## 2018-07-10 LAB — PROCALCITONIN: Procalcitonin: <0.1 ng/mL

## 2018-07-10 LAB — LACTIC ACID, PLASMA: Lactic Acid, Venous: 2 mmol/L (ref 0.5–1.9)

## 2018-07-10 MED ORDER — VANCOMYCIN HCL IN DEXTROSE 1-5 GM/200ML-% IV SOLN
1000.0000 mg | Freq: Once | INTRAVENOUS | Status: AC
Start: 2018-07-10 — End: 2018-07-10
  Administered 2018-07-10: 1000 mg via INTRAVENOUS
  Filled 2018-07-10: qty 200

## 2018-07-10 MED ORDER — LEVOTHYROXINE SODIUM 50 MCG PO TABS
62.5000 ug | ORAL_TABLET | Freq: Every day | ORAL | Status: DC
  Administered 2018-07-11: 62.5 ug via ORAL
  Filled 2018-07-10: qty 1

## 2018-07-10 MED ORDER — VANCOMYCIN HCL IN DEXTROSE 750-5 MG/150ML-% IV SOLN
750.0000 mg | Freq: Three times a day (TID) | INTRAVENOUS | Status: DC
Start: 2018-07-11 — End: 2018-07-11
  Administered 2018-07-10 – 2018-07-11 (×2): 750 mg via INTRAVENOUS
  Filled 2018-07-10 (×3): qty 150

## 2018-07-10 MED ORDER — PIPERACILLIN-TAZOBACTAM 3.375 G IVPB 30 MIN
3.3750 g | Freq: Once | INTRAVENOUS | Status: AC
  Administered 2018-07-10: 3.375 g via INTRAVENOUS
  Filled 2018-07-10: qty 50

## 2018-07-10 MED ORDER — PRAVASTATIN SODIUM 40 MG PO TABS
40.0000 mg | ORAL_TABLET | Freq: Every day | ORAL | Status: DC
  Administered 2018-07-10: 40 mg via ORAL
  Filled 2018-07-10: qty 1

## 2018-07-10 MED ORDER — PIPERACILLIN-TAZOBACTAM 4.5 G IVPB: 4.5000 g | Freq: Once | INTRAVENOUS | Status: DC

## 2018-07-10 MED ORDER — ENOXAPARIN SODIUM 40 MG/0.4ML ~~LOC~~ SOLN
40.0000 mg | SUBCUTANEOUS | Status: DC
  Administered 2018-07-10: 40 mg via SUBCUTANEOUS
  Filled 2018-07-10: qty 0.4

## 2018-07-10 MED ORDER — HYDROCORTISONE NA SUCCINATE PF 100 MG IJ SOLR
100.0000 mg | Freq: Three times a day (TID) | INTRAMUSCULAR | Status: DC
  Administered 2018-07-10 – 2018-07-11 (×3): 100 mg via INTRAVENOUS
  Filled 2018-07-10 (×3): qty 2

## 2018-07-10 MED ORDER — ENSURE ENLIVE PO LIQD
237.0000 mL | Freq: Three times a day (TID) | ORAL | Status: DC
  Administered 2018-07-11: 237 mL via ORAL

## 2018-07-10 MED ORDER — UMECLIDINIUM BROMIDE 62.5 MCG/INH IN AEPB
1.0000 | INHALATION_SPRAY | Freq: Every day | RESPIRATORY_TRACT | Status: DC
  Administered 2018-07-11: 1 via RESPIRATORY_TRACT
  Filled 2018-07-10: qty 7

## 2018-07-10 MED ORDER — PIPERACILLIN-TAZOBACTAM 3.375 G IVPB
3.3750 g | Freq: Three times a day (TID) | INTRAVENOUS | Status: DC
  Administered 2018-07-10 – 2018-07-11 (×2): 3.375 g via INTRAVENOUS
  Filled 2018-07-10 (×3): qty 50

## 2018-07-10 MED ORDER — ONDANSETRON HCL 4 MG/2ML IJ SOLN: 4.0000 mg | Freq: Four times a day (QID) | INTRAMUSCULAR | Status: DC | PRN

## 2018-07-10 MED ORDER — FLUTICASONE-UMECLIDIN-VILANT 100-62.5-25 MCG/INH IN AEPB: 1.0000 | INHALATION_SPRAY | Freq: Every day | RESPIRATORY_TRACT | Status: DC

## 2018-07-10 MED ORDER — PREDNISONE 20 MG PO TABS
60.0000 mg | ORAL_TABLET | Freq: Once | ORAL | Status: AC
  Administered 2018-07-10: 60 mg via ORAL
  Filled 2018-07-10: qty 3

## 2018-07-10 MED ORDER — ONDANSETRON HCL 4 MG PO TABS: 4.0000 mg | ORAL_TABLET | Freq: Four times a day (QID) | ORAL | Status: DC | PRN

## 2018-07-10 MED ORDER — ASPIRIN 81 MG PO CHEW
81.0000 mg | CHEWABLE_TABLET | Freq: Every day | ORAL | Status: DC
  Administered 2018-07-10 – 2018-07-11 (×2): 81 mg via ORAL
  Filled 2018-07-10 (×2): qty 1

## 2018-07-10 MED ORDER — IPRATROPIUM-ALBUTEROL 0.5-2.5 (3) MG/3ML IN SOLN
3.0000 mL | Freq: Once | RESPIRATORY_TRACT | Status: AC
  Administered 2018-07-10: 3 mL via RESPIRATORY_TRACT
  Filled 2018-07-10: qty 3

## 2018-07-10 MED ORDER — FLUTICASONE FUROATE-VILANTEROL 100-25 MCG/INH IN AEPB
1.0000 | INHALATION_SPRAY | Freq: Every day | RESPIRATORY_TRACT | Status: DC
  Administered 2018-07-11: 1 via RESPIRATORY_TRACT
  Filled 2018-07-10: qty 28

## 2018-07-10 MED ORDER — ACETAMINOPHEN 650 MG RE SUPP: 650.0000 mg | Freq: Four times a day (QID) | RECTAL | Status: DC | PRN

## 2018-07-10 MED ORDER — MIRTAZAPINE 15 MG PO TABS
15.0000 mg | ORAL_TABLET | Freq: Every evening | ORAL | Status: DC | PRN
  Administered 2018-07-10: 15 mg via ORAL
  Filled 2018-07-10: qty 1

## 2018-07-10 MED ORDER — ACETAMINOPHEN 325 MG PO TABS: 650.0000 mg | ORAL_TABLET | Freq: Four times a day (QID) | ORAL | Status: DC | PRN

## 2018-07-10 MED ORDER — SODIUM CHLORIDE 0.9 % IV BOLUS
1000.0000 mL | Freq: Once | INTRAVENOUS | Status: AC
  Administered 2018-07-10: 1000 mL via INTRAVENOUS

## 2018-07-10 MED ORDER — SODIUM CHLORIDE 0.9 % IV SOLN
INTRAVENOUS | Status: DC
  Administered 2018-07-10 – 2018-07-11 (×2): via INTRAVENOUS

## 2018-07-10 MED ORDER — IPRATROPIUM-ALBUTEROL 0.5-2.5 (3) MG/3ML IN SOLN
3.0000 mL | Freq: Four times a day (QID) | RESPIRATORY_TRACT | Status: DC | PRN
  Administered 2018-07-11: 3 mL via RESPIRATORY_TRACT
  Filled 2018-07-10: qty 3

## 2018-07-10 MED ORDER — PANTOPRAZOLE SODIUM 40 MG PO TBEC
40.0000 mg | DELAYED_RELEASE_TABLET | Freq: Every day | ORAL | Status: DC
  Administered 2018-07-10 – 2018-07-11 (×2): 40 mg via ORAL
  Filled 2018-07-10 (×2): qty 1

## 2018-07-10 NOTE — H&P (Addendum)
[facility] History and Physical  [name]:[id number] DOB: 07/08/1956 DOA: 07/10/2018   PCP: [name], MD  Specialists: Dr. Redmond Baseman with ENT.  Also followed by ENT at [facility], Dr. Nicolette Bang.  Chief Complaint: Pneumonia  HPI: Stephen [name] is a 62 y.o. 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  Also has history of COPD, hypothyroidism, anxiety disorder, nocturnal hypoxemia who was recently hospitalized in June with increased breathing.  Thought to be secondary to COPD exacerbation.  There was also some concern for stridor.  Patient was seen by ENT and underwent transnasal fiberoptic laryngoscopy which showed stable findings.  He was prescribed steroids and was asked to follow-up with ENT in their office.  Patient has an appointment on Thursday with Dr. Redmond Baseman.  Patient was seen by his cardiologist yesterday who ordered a CT coronary study.  This study suggested a left lower lobe pneumonia.  Patient was seen by his primary care provider who sent him to the ED.  Patient has noticed perhaps slightly more cough than before.  He is bringing up yellowish-greenish expectoration.  Most of this is chronic.  His breathing is about the same.  He denies any dizziness or lightheadedness.  Denies any chest pain currently.  No fever.  He did have an episode of vomiting a few days ago.  He tells me that he does not take special diet.  He was seen by speech therapy during his previous hospitalization and regular diet with thin liquids was recommended.  However patient tells me that he does not always sit upright when he is eating or drinking.  In the emergency department patient was started on broad-spectrum antibiotics due to concern  for healthcare associated pneumonia.  He was noted to be hypotensive although he is asymptomatic.  Home Medications: Prior to Admission medications   Medication Sig Start Date End Date Taking? Authorizing Provider  albuterol (PROVENTIL HFA;VENTOLIN HFA) 108 (90 Base) MCG/ACT inhaler Inhale 1-2 puffs into the lungs every 6 (six) hours as needed for wheezing or shortness of breath.    [provider]  albuterol (PROVENTIL) (2.5 MG/3ML) 0.083% nebulizer solution Take 2.5 mg by nebulization 3 (three) times daily as needed for wheezing or shortness of breath.     [provider]  aspirin 81 MG chewable tablet Chew 81 mg by mouth daily.    [provider]  Fluticasone-Umeclidin-Vilant (TRELEGY ELLIPTA) 100-62.5-25 MCG/INH AEPB Inhale 1 puff into the lungs daily. 08/13/17   Brand Males, MD  Fluticasone-Umeclidin-Vilant (TRELEGY ELLIPTA) 100-62.5-25 MCG/INH AEPB Inhale 1 puff into the lungs daily. 05/27/18   Brand Males, MD  ipratropium-albuterol (DUONEB) 0.5-2.5 (3) MG/3ML SOLN Take 3 mLs by nebulization every 6 (six) hours as needed. Patient taking differently: Take 3 mLs by nebulization every 6 (six) hours as needed (SOB).  06/13/18   Blanchie Dessert, MD  levothyroxine (SYNTHROID, LEVOTHROID) 125 MCG tablet Take 62.5 mcg by mouth daily before breakfast.     [provider]  mirtazapine (REMERON) 15 MG tablet Take 15 mg by mouth at bedtime as needed (sleep).  04/15/18   [provider]  nitroGLYCERIN (NITROSTAT) 0.4 MG SL tablet Place 1 tablet (0.4 mg total) under the tongue every 5 (five) minutes as needed for chest pain. 08/09/13   Doree Barthel  L, MD  omeprazole (PRILOSEC) 40 MG capsule Take 1 capsule (40 mg total) by mouth daily. 04/08/18   Ladene Artist, MD  pravastatin (PRAVACHOL) 40 MG tablet Take 40 mg by mouth at bedtime.     [provider]  predniSONE (DELTASONE) 20 MG tablet Take 3 tablets (60 mg total) by mouth daily.  06/13/18   Blanchie Dessert, MD  temazepam (RESTORIL) 30 MG capsule Take 30 mg by mouth at bedtime.     [provider]  Vitamin D, Ergocalciferol, (DRISDOL) 50000 UNITS CAPS Take 50,000 Units by mouth every 14 (fourteen) days.  05/07/11   [provider]    Allergies:  Allergies  Allergen Reactions  . Codeine Other (See Comments)    Unknown. Mother told him he was allergic  . Cymbalta [Duloxetine Hcl] Nausea And Vomiting  . Montelukast Sodium Other (See Comments)    Causes sinusitis    Past Medical History: Past Medical History:  Diagnosis Date  . Allergic rhinitis, cause unspecified   . Anxiety   . Blindness of left eye   . Complication of anesthesia    difficulty awaking after colonoscopy   . Depression   . Dyspnea   . GERD (gastroesophageal reflux disease)   . Hypothyroidism   . Lung cancer ([place])    lung ca dx 06, has had chemo and radiation  . Osteoporosis   . Other emphysema ([place])    copd  . Throat cancer ([place])    throat ca dx 2007  . Thrombosed hemorrhoids   . Tubular adenoma of colon 2009  . Unspecified vitamin D deficiency     Past Surgical History:  Procedure Laterality Date  . COLONOSCOPY    . HERNIA REPAIR     as a baby- not sure where hernia was  . KNEE SURGERY Left 1972   .  has hardware  . LUNG LOBECTOMY     RUL  . MICROLARYNGOSCOPY WITH CO2 LASER AND EXCISION OF VOCAL CORD LESION N/A 12/19/2017   Procedure: MICROLARYNGOSCOPY WITH BIOPSY;  Surgeon: Melida Quitter, MD;  Location: [facility];  Service: ENT;  Laterality: N/A;  . ROTATOR CUFF REPAIR Left   . SHOULDER SURGERY Right    rotator cuff  . THROAT SURGERY     Laser surgery for throat cancer  . TRANSTHORACIC ECHOCARDIOGRAM  01/13/2012   EF=>55%; trace MR/TR;     Social History: Lives in [place].  Denies smoking alcohol use or illicit drug use currently.  Usually independent with daily activities.  Family History:  Family History  Problem Relation Age of Onset  . Cancer  Mother        Brain tumor  . COPD Mother   . Heart disease Father        CABG  . Prostate cancer Father   . Cystic fibrosis Sister        also heart failure  . Mental illness Brother   . Heart failure Paternal Grandmother   . Colon cancer Neg Hx   . Colon polyps Neg Hx   . Esophageal cancer Neg Hx      Review of Systems - History obtained from the patient General ROS: positive for  - fatigue Psychological ROS: negative Ophthalmic ROS: negative ENT ROS: as in hpi Allergy and Immunology ROS: negative Hematological and Lymphatic ROS: negative Endocrine ROS: negative Respiratory ROS: as in hpi Cardiovascular ROS: as in hpi Gastrointestinal ROS: no abdominal pain, change in bowel habits, or black or bloody stools Genito-Urinary ROS:  no dysuria, trouble voiding, or hematuria Musculoskeletal ROS: negative Neurological ROS: no TIA or stroke symptoms Dermatological ROS: negative  Physical Examination  Vitals:   07/10/18 1445 07/10/18 1515 07/10/18 1545 07/10/18 1600  BP: 105/72 98/73 117/76 90/61  Pulse: 92 90 94 94  Resp: 19 (!) 22 (!) 27 16  Temp:      TempSrc:      SpO2: 97% 95% 96% 94%  Weight:      Height:        BP 90/61   Pulse 94   Temp 98.2 F (36.8 C) (Oral)   Resp 16   Ht 5\' 10"  (1.778 m)   Wt 69.9 kg (154 lb)   SpO2 94%   BMI 22.10 kg/m   General appearance: alert, cooperative, appears stated age and no distress Head: Normocephalic, without obvious abnormality, atraumatic Throat: Mild stridor. No obvious lesions noted in the throat. Neck: no carotid bruit, no JVD and supple, symmetrical, trachea midline Back: symmetric, no curvature. ROM normal. No CVA tenderness. Resp: Mildly tachypneic at rest.  No use of accessory muscles.  Crackles at the left base.  No rhonchi.  No wheezing. Cardio: regular rate and rhythm, S1, S2 normal, no murmur, click, rub or gallop GI: soft, non-tender; bowel sounds normal; no masses,  no organomegaly Extremities:  extremities normal, atraumatic, no cyanosis or edema Pulses: 2+ and symmetric Skin: Skin color, texture, turgor normal. No rashes or lesions Lymph nodes: Cervical, supraclavicular, and axillary nodes normal. Neurologic: No obvious focal neurological deficits.   Labs on Admission: I have personally reviewed following labs and imaging studies  CBC: Recent Labs  Lab 07/10/18 1425  WBC 9.8  NEUTROABS 7.4  HGB 13.4  HCT 44.8  MCV 89.4  PLT 242   Basic Metabolic Panel: Recent Labs  Lab 07/10/18 1425  NA 140  K 3.6  CL 92*  CO2 36*  GLUCOSE 76  BUN 5*  CREATININE 0.80  CALCIUM 9.2   GFR: Estimated Creatinine Clearance: 94.7 mL/min (by C-G formula based on SCr of 0.8 mg/dL). Liver Function Tests: Recent Labs  Lab 07/10/18 1425  AST 16  ALT 17  ALKPHOS 104  BILITOT 1.0  PROT 6.5  ALBUMIN 3.3*     Radiological Exams on Admission: Ct Coronary Morph W/cta Cor W/score W/ca W/cm &/or Wo/cm  Result Date: 07/09/2018 EXAM: OVER-READ INTERPRETATION  CT CHEST The following report is an over-read performed by radiologist Dr. Vinnie Langton of [facility], [place] on 07/09/2018. This over-read does not include interpretation of cardiac or coronary anatomy or pathology. The coronary calcium score/coronary CTA interpretation by the cardiologist is attached. COMPARISON:  Chest CT 05/04/2018. FINDINGS: Areas of architectural distortion are noted in the right lung related to prior right upper lobectomy. Chronic pleural thickening in the lateral aspect of the right hemithorax. Diffuse bronchial wall thickening with moderate centrilobular and paraseptal emphysema. In addition, there is some patchy peribronchovascular ground-glass attenuation noted throughout the left lower lobe, concerning for potential bronchopneumonia. No acute consolidative airspace disease. No pleural effusions. No lymphadenopathy noted in the visualized portions of the thorax. Visualized portions of the upper abdomen  are unremarkable. No aggressive appearing lytic or blastic lesions are noted in the visualized portions of the skeleton. IMPRESSION: 1. Probable left lower lobe bronchopneumonia, as above. 2. Diffuse bronchial wall thickening with moderate centrilobular and paraseptal emphysema. Electronically Signed   By: Vinnie Langton M.D.   On: 07/09/2018 15:39      Problem List  Principal Problem:  Healthcare-associated pneumonia Active Problems:   COPD, severe ([place])   Malignant neoplasm of glottis ([facility])   History of lung cancer and throat cancer   Protein-calorie malnutrition, severe   HCAP (healthcare-associated pneumonia)   Assessment: This is 62 year old Caucasian male with past medical history as stated earlier who presents after he was found to have pneumonia on recent CT scan.  Patient likely has healthcare associated pneumonia since he was recently hospitalized.  Other possibility includes possible aspiration.  Patient does not appear to be overtly more symptomatic than at baseline.  Interestingly patient is noted to have borderline low blood pressures.  This appears to be new.  He is asymptomatic however.  Plan: #1 Pneumonia likely healthcare associated versus aspiration: Patient will be admitted.  Placed on vancomycin and Zosyn for now.  Blood cultures.  Sputum cultures.  Mechanical soft diet.  Aspiration precautions.  Consult speech pathologist.  #2  Hypotension: Previous visits were reviewed.  Blood pressure is usually not this low.  We will check lactic acid level.  He is however not overtly symptomatic.  His MAP is greater than 70.  He is noted to be on prednisone taper.  Could he be experiencing some adrenal insufficiency?.  We will give him stress dose steroids.  Not noted to be on any antihypertensives.  #3  Chronic respiratory failure with hypoxia: Mostly nocturnal hypoxia.  He uses oxygen at bedtime.  He is noted to be mildly tachypneic but this is his baseline as per the  patient.  #4 History of laryngeal cancer status post laryngectomy in February.  Concern for some laryngeal stenosis.  Recently seen by ENT.  He has a follow-up with ENT next week.  Stable for now.  #5  History of COPD: Stable.  No wheezing is heard at this time.  Continue home medications.  #6 Coronary artery disease: Seen by cardiology recently.  He underwent a coronary CT yesterday.  Report is not available.  Patient denies any chest pain currently.  #7 severe protein calorie malnutrition: Patient will likely benefit from nutritional supplements.  Mentions that he has been losing weight.   DVT Prophylaxis: Lovenox Code Status: Full Code Family Communication: Discussed with the patient Consults called: None  Severity of Illness: The appropriate patient status for this patient is INPATIENT. Inpatient status is judged to be reasonable and necessary in order to provide the required intensity of service to ensure the patient's safety. The patient's presenting symptoms, physical exam findings, and initial radiographic and laboratory data in the context of their chronic comorbidities is felt to place them at high risk for further clinical deterioration. Furthermore, it is not anticipated that the patient will be medically stable for discharge from the hospital within 2 midnights of admission. The following factors support the patient status of inpatient.   " The patient's presenting symptoms include shortness of breath, cough. " The worrisome physical exam findings include crackles in the lungs. " The initial radiographic and laboratory data are worrisome because of pneumonia. " The chronic co-morbidities include laryngeal cancer.   * I certify that at the point of admission it is my clinical judgment that the patient will require inpatient hospital care spanning beyond 2 midnights from the point of admission due to high intensity of service, high risk for further deterioration and high frequency  of surveillance required.*   Further management decisions will depend on results of further testing and patient's response to treatment.   Bonnielee Haff  [facility] Pager [phone]  If  7PM-7AM, please contact night-coverage [email or web address] Password [facility]  07/10/2018, 5:02 PM

## 2018-07-10 NOTE — ED Provider Notes (Signed)
[facility] EMERGENCY DEPARTMENT Provider Note   CSN: [id number] Arrival date & time: 07/10/18  1320   History   Chief Complaint Chief Complaint  Patient presents with  . Shortness of Breath    HPI Stephen [name] is a 62 y.o. male with a medical history of COPD, lung cancer s/p lung lobectomy, throat cancer s/p laser surgery to the throat, and HTN who presents to the ED from his PCP's office for labored breathing and a cardiac CT on 7/11 that showed a possible left lower lobe bronchopneumonia. Stephen [name] reports that he does not feel dyspneic and that he is breathing normally for him (which he knows looks labored). He denies fevers, chills, increased cough, increased sputum production, chest pain, or leg swelling. His last duoneb treatment was at home around 5am. Of note he was recently hospitalized (discharged 6/27) for COPD exacerbation, for which he is still on a prednisone course.   Past Medical History:  Diagnosis Date  . Allergic rhinitis, cause unspecified   . Anxiety   . Blindness of left eye   . Complication of anesthesia    difficulty awaking after colonoscopy   . Depression   . Dyspnea   . GERD (gastroesophageal reflux disease)   . Hypothyroidism   . Lung cancer ([place])    lung ca dx 06, has had chemo and radiation  . Osteoporosis   . Other emphysema ([place])    copd  . Throat cancer ([place])    throat ca dx 2007  . Thrombosed hemorrhoids   . Tubular adenoma of colon 2009  . Unspecified vitamin D deficiency     Patient Active Problem List   Diagnosis Date Noted  . Healthcare-associated pneumonia 07/10/2018  . HCAP (healthcare-associated pneumonia) 07/10/2018  . Protein-calorie malnutrition, severe 06/25/2018  . Acute exacerbation of chronic obstructive pulmonary disease (COPD) ([place]) 06/24/2018  . Hypothyroidism 06/24/2018  . Esophageal dysmotility 06/24/2018  . GERD (gastroesophageal reflux disease) 06/24/2018  . History of lung cancer and  throat cancer 06/24/2018  . FTT (failure to thrive) in adult 06/24/2018  . Abnormal echocardiogram 06/24/2018  . Hyperlipidemia 05/22/2018  . CAD (coronary artery disease)   . COPD with acute exacerbation ([place]) 05/05/2018  . Elevated troponin 05/04/2018  . Acute respiratory failure with hypoxia ([place]) 05/04/2018  . 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 Alpha-1-antitrypsin deficiency carrier ([place]) 07/17/2016  . Eosinophilia 05/30/2016  . History of seasonal allergies 05/30/2016  . Stopped smoking with greater than 40 pack year history 05/30/2016  . History of lobectomy of lung 05/30/2016  . COLD (chronic obstructive lung disease) ([place]) 07/27/2015  . History of elevated PSA 07/27/2015  . COPD exacerbation ([place]) 01/23/2015  . COPD, severe ([place]) 01/12/2015  . Research study patient 10/07/2014  . Need for prophylactic vaccination against Streptococcus pneumoniae (pneumococcus) 09/15/2014  . Chest pain 08/09/2013  . History of TIA (transient ischemic attack) 08/09/2013  . Otitis media of left ear 08/09/2013  . Bronchogenic cancer of right lung ([place]) 09/16/2012  . Hemorrhoids, external, thrombosed 08/26/2011  . BACK PAIN 02/22/2011  . COPD (chronic obstructive pulmonary disease) ([place]) 02/06/2011  . ROTATOR CUFF TEAR 02/15/2010  . ALLERGIC RHINITIS 04/15/2008    Past Surgical History:  Procedure Laterality Date  . COLONOSCOPY    . HERNIA REPAIR     as a baby- not sure where hernia was  . KNEE SURGERY Left 1972   .  has hardware  . LUNG LOBECTOMY     RUL  .  MICROLARYNGOSCOPY WITH CO2 LASER AND EXCISION OF VOCAL CORD LESION N/A 12/19/2017   Procedure: MICROLARYNGOSCOPY WITH BIOPSY;  Surgeon: Melida Quitter, MD;  Location: [facility];  Service: ENT;  Laterality: N/A;  . ROTATOR CUFF REPAIR Left   . SHOULDER SURGERY Right    rotator cuff  . THROAT SURGERY     Laser surgery for throat cancer  . TRANSTHORACIC ECHOCARDIOGRAM  01/13/2012   EF=>55%; trace MR/TR;         Home  Medications    Prior to Admission medications   Medication Sig Start Date End Date Taking? Authorizing Provider  albuterol (PROVENTIL HFA;VENTOLIN HFA) 108 (90 Base) MCG/ACT inhaler Inhale 1-2 puffs into the lungs every 6 (six) hours as needed for wheezing or shortness of breath.   Yes [provider]  albuterol (PROVENTIL) (2.5 MG/3ML) 0.083% nebulizer solution Take 2.5 mg by nebulization every 6 (six) hours as needed for wheezing or shortness of breath.    Yes [provider]  aspirin EC 81 MG tablet Take 81 mg by mouth daily.   Yes [provider]  Fluticasone-Umeclidin-Vilant (TRELEGY ELLIPTA) 100-62.5-25 MCG/INH AEPB Inhale 1 puff into the lungs daily. 05/27/18  Yes Brand Males, MD  ipratropium-albuterol (DUONEB) 0.5-2.5 (3) MG/3ML SOLN Take 3 mLs by nebulization every 6 (six) hours as needed. Patient taking differently: Take 3 mLs by nebulization every 6 (six) hours as needed (shortness of breath).  06/13/18  Yes Plunkett, Loree Fee, MD  levothyroxine (SYNTHROID, LEVOTHROID) 125 MCG tablet Take 62.5 mcg by mouth daily before breakfast.    Yes [provider]  Multiple Vitamin (MULTIVITAMIN WITH MINERALS) TABS tablet Take 1 tablet by mouth daily. Centrum Silver   Yes [provider]  nitroGLYCERIN (NITROSTAT) 0.4 MG SL tablet Place 1 tablet (0.4 mg total) under the tongue every 5 (five) minutes as needed for chest pain. 08/09/13  Yes Delfina Redwood, MD  omeprazole (PRILOSEC) 40 MG capsule Take 1 capsule (40 mg total) by mouth daily. 04/08/18  Yes Ladene Artist, MD  pravastatin (PRAVACHOL) 40 MG tablet Take 40 mg by mouth at bedtime.    Yes [provider]  predniSONE (DELTASONE) 20 MG tablet Take 3 tablets (60 mg total) by mouth daily. Patient taking differently: Take 20-60 mg by mouth See admin instructions. 21 day tapered course: take 3 tablets (60 mg) by mouth daily for 7 days, then take 2 tablets (40 mg) daily for 7 days, then  take 1 tablet (20 mg) daily for 7 days, then stop 06/13/18  Yes Plunkett, Whitney, MD  temazepam (RESTORIL) 15 MG capsule Take 30 mg by mouth at bedtime.    Yes [provider]  Vitamin D, Ergocalciferol, (DRISDOL) 50000 UNITS CAPS Take 50,000 Units by mouth every 14 (fourteen) days.  05/07/11  Yes [provider]  Fluticasone-Umeclidin-Vilant (TRELEGY ELLIPTA) 100-62.5-25 MCG/INH AEPB Inhale 1 puff into the lungs daily. Patient not taking: Reported on 07/10/2018 08/13/17   Brand Males, MD    Family History Family History  Problem Relation Age of Onset  . Cancer Mother        Brain tumor  . COPD Mother   . Heart disease Father        CABG  . Prostate cancer Father   . Cystic fibrosis Sister        also heart failure  . Mental illness Brother   . Heart failure Paternal Grandmother   . Colon cancer Neg Hx   . Colon polyps Neg Hx   .  Esophageal cancer Neg Hx     Social History Social History   Tobacco Use  . Smoking status: Former Smoker    Packs/day: 1.50    Years: 32.00    Pack years: 48.00    Types: Cigarettes    Last attempt to quit: 12/30/2004    Years since quitting: 13.5  . Smokeless tobacco: Never Used  . Tobacco comment: 1 ppd x 30 years  Substance Use Topics  . Alcohol use: Yes    Comment: 1- 2 beers monthly  . Drug use: No     Allergies   Codeine; Cymbalta [duloxetine hcl]; and Montelukast sodium   Review of Systems Review of Systems  Constitutional: Negative for chills and fever.  HENT: Negative for rhinorrhea and sore throat.   Respiratory: Positive for chest tightness. Negative for cough and shortness of breath.   Cardiovascular: Negative for chest pain and leg swelling.  Gastrointestinal: Negative for abdominal pain, nausea and vomiting.  Genitourinary: Negative for difficulty urinating and dysuria.  Musculoskeletal: Negative for neck pain.       Back tightness between his shoulder blades  Skin: Negative for rash and wound.    Neurological: Negative for dizziness and headaches.     Physical Exam Updated Vital Signs BP 108/69 (BP Location: Right Arm)   Pulse 89   Temp 98.7 F (37.1 C) (Oral)   Resp (!) 23   Ht 5\' 10"  (1.778 m)   Wt 71.8 kg (158 lb 4.6 oz)   SpO2 99%   BMI 22.71 kg/m   Physical Exam  Constitutional: He is oriented to person, place, and time. No distress.  Thin  HENT:  Head: Normocephalic and atraumatic.  Eyes: Pupils are equal, round, and reactive to light. EOM are normal.  Neck: Normal range of motion.  Cardiovascular:  Distant heart sounds  Pulmonary/Chest:  Labored breathing with accessory muscle use and stridor. Expiratory wheezing throughout.   Abdominal: Soft. He exhibits no distension. There is no tenderness.  Musculoskeletal:  No lower extremity edema.  Neurological: He is alert and oriented to person, place, and time.  Skin: Skin is warm and dry. Capillary refill takes less than 2 seconds. No rash noted.  Psychiatric: He has a normal mood and affect. His behavior is normal.     ED Treatments / Results  Labs (all labs ordered are listed, but only abnormal results are displayed) Labs Reviewed  CBC WITH DIFFERENTIAL/PLATELET - Abnormal; Notable for the following components:      Result Value   MCHC 29.9 (*)    All other components within normal limits  COMPREHENSIVE METABOLIC PANEL - Abnormal; Notable for the following components:   Chloride 92 (*)    CO2 36 (*)    BUN 5 (*)    Albumin 3.3 (*)    All other components within normal limits  LACTIC ACID, PLASMA - Abnormal; Notable for the following components:   Lactic Acid, Venous 2.0 (*)    All other components within normal limits  CULTURE, BLOOD (ROUTINE X 2)  CULTURE, BLOOD (ROUTINE X 2)  CULTURE, EXPECTORATED SPUTUM-ASSESSMENT  GRAM STAIN  PROCALCITONIN  PROCALCITONIN  STREP PNEUMONIAE URINARY ANTIGEN  BASIC METABOLIC PANEL  CBC    EKG None  Radiology Ct Coronary Morph W/cta Cor W/score W/ca  W/cm &/or Wo/cm  Addendum Date: 07/10/2018   ADDENDUM REPORT: 07/10/2018 18:07 CLINICAL DATA:  Chest pain EXAM: Cardiac CTA MEDICATIONS: Sub lingual nitro. 4mg  x 2 and lopressor 5mg  IV TECHNIQUE: The patient was scanned  on a Siemens 601 slice scanner. Gantry rotation speed was 250 msecs. Collimation was 0.8 mm. A 100 kV prospective scan was triggered in the ascending thoracic aorta at 35-75% of the R-R interval. Average HR during the scan was 60 bpm. The 3D data set was interpreted on a dedicated work station using MPR, MIP and VRT modes. A total of 80cc of contrast was used. FINDINGS: Non-cardiac: See separate report from [facility]. Calcium Score: 0 Agatston units. Coronary Arteries: Right dominant with no anomalies LM: No plaque or stenosis. LAD system: There is a discrete area of soft plaque in the proximal LAD with probably mild stenosis. Circumflex system: No plaque or stenosis. RCA system: No plaque or stenosis. IMPRESSION: 1. Coronary artery calcium score 0 Agatston units, suggesting low risk for future cardiac events. 2. There is an area of soft plaque in the proximal LAD, suspect mild stenosis. I will send for FFR to confirm. Dalton Mclean Electronically Signed   By: Loralie Champagne M.D.   On: 07/10/2018 18:07   Result Date: 07/10/2018 EXAM: OVER-READ INTERPRETATION  CT CHEST The following report is an over-read performed by radiologist Dr. Vinnie Langton of [facility], [place] on 07/09/2018. This over-read does not include interpretation of cardiac or coronary anatomy or pathology. The coronary calcium score/coronary CTA interpretation by the cardiologist is attached. COMPARISON:  Chest CT 05/04/2018. FINDINGS: Areas of architectural distortion are noted in the right lung related to prior right upper lobectomy. Chronic pleural thickening in the lateral aspect of the right hemithorax. Diffuse bronchial wall thickening with moderate centrilobular and paraseptal emphysema. In addition,  there is some patchy peribronchovascular ground-glass attenuation noted throughout the left lower lobe, concerning for potential bronchopneumonia. No acute consolidative airspace disease. No pleural effusions. No lymphadenopathy noted in the visualized portions of the thorax. Visualized portions of the upper abdomen are unremarkable. No aggressive appearing lytic or blastic lesions are noted in the visualized portions of the skeleton. IMPRESSION: 1. Probable left lower lobe bronchopneumonia, as above. 2. Diffuse bronchial wall thickening with moderate centrilobular and paraseptal emphysema. Electronically Signed: By: Vinnie Langton M.D. On: 07/09/2018 15:39    Procedures Procedures (including critical care time)  Medications Ordered in ED Medications  piperacillin-tazobactam (ZOSYN) IVPB 3.375 g (has no administration in time range)  vancomycin (VANCOCIN) IVPB 750 mg/150 ml premix (has no administration in time range)  aspirin chewable tablet 81 mg (81 mg Oral Given 07/10/18 2025)  ipratropium-albuterol (DUONEB) 0.5-2.5 (3) MG/3ML nebulizer solution 3 mL (has no administration in time range)  levothyroxine (SYNTHROID, LEVOTHROID) tablet 62.5 mcg (has no administration in time range)  mirtazapine (REMERON) tablet 15 mg (has no administration in time range)  pantoprazole (PROTONIX) EC tablet 40 mg (40 mg Oral Given 07/10/18 2025)  pravastatin (PRAVACHOL) tablet 40 mg (has no administration in time range)  hydrocortisone sodium succinate (SOLU-CORTEF) 100 MG injection 100 mg (100 mg Intravenous Given 07/10/18 1741)  enoxaparin (LOVENOX) injection 40 mg (40 mg Subcutaneous Given 07/10/18 2026)  0.9 %  sodium chloride infusion ( Intravenous New Bag/Given 07/10/18 2025)  acetaminophen (TYLENOL) tablet 650 mg (has no administration in time range)    Or  acetaminophen (TYLENOL) suppository 650 mg (has no administration in time range)  ondansetron (ZOFRAN) tablet 4 mg (has no administration in time range)     Or  ondansetron (ZOFRAN) injection 4 mg (has no administration in time range)  feeding supplement (ENSURE ENLIVE) (ENSURE ENLIVE) liquid 237 mL (237 mLs Oral Not Given 07/10/18 2037)  fluticasone  furoate-vilanterol (BREO ELLIPTA) 100-25 MCG/INH 1 puff (has no administration in time range)    And  umeclidinium bromide (INCRUSE ELLIPTA) 62.5 MCG/INH 1 puff (has no administration in time range)  ipratropium-albuterol (DUONEB) 0.5-2.5 (3) MG/3ML nebulizer solution 3 mL (3 mLs Nebulization Given 07/10/18 1458)  piperacillin-tazobactam (ZOSYN) IVPB 3.375 g (0 g Intravenous Stopped 07/10/18 1544)  vancomycin (VANCOCIN) IVPB 1000 mg/200 mL premix (0 mg Intravenous Stopped 07/10/18 1614)  predniSONE (DELTASONE) tablet 60 mg (60 mg Oral Given 07/10/18 1646)  sodium chloride 0.9 % bolus 1,000 mL (1,000 mLs Intravenous New Bag/Given 07/10/18 1711)  ipratropium-albuterol (DUONEB) 0.5-2.5 (3) MG/3ML nebulizer solution 3 mL (3 mLs Nebulization Given 07/10/18 1816)     Initial Impression / Assessment and Plan / ED Course  I have reviewed the triage vital signs and the nursing notes.  Pertinent labs & imaging results that were available during my care of the patient were reviewed by me and considered in my medical decision making (see chart for details).  KAYRON HICKLIN is a 62 y.o. male with a medical history of COPD (on 2-4L oxygen at home at night), lung cancer s/p lung lobectomy, throat cancer s/p laser surgery to the throat, and HTN who presents to the ED for possible left lower lobe bronchopneumonia as seen on cardiac CT 7/11. Upon arrival to the ED, Stephen [name] was afebrile, hemodynamically stable, and satting well on room air. Physical exam was significant for labored, stridulous breathing (which he reports is his baseline since his esophagus surgery) and expiratory wheezing. Labs were significant for normal white count and CO2 36. Blood cultures were drawn. CXR was not performed given he had a CT last  night. Because Stephen [name] was hospitalized recently for COPD exacerbation, he was diagnosed with healthcare acquired pneumonia and treated with vanc and zosyn. He was also given 60 mg of prednisone as he had not yet taken his 20mg  dose today. On reassessment, patient continued to feel well but was hypotensive in the 82X systolic. Patient resuscitated with 1L NS with pressures returning to the 93Z systolic. Patient warrants hospital admission for treatment of healthcare acquired pneumonia in the setting of his comorbidities and hypotension. Patient accepted by Dr. Maryland Pink.  Final Clinical Impressions(s) / ED Diagnoses   Final diagnoses:  Hypotension, unspecified hypotension type  HCAP (healthcare-associated pneumonia)  Wheezing    ED Discharge Orders    None       Dorrell, Andree Elk, MD 07/10/18 2045    Deno Etienne, DO 07/10/18 2108

## 2018-07-10 NOTE — ED Notes (Signed)
Light green, blue, lavender, dark green tops collected by York Cerise, Therapist, sports. Dark green top on rocker in [employer]. The rest are still in Pt's room at this time.

## 2018-07-10 NOTE — ED Notes (Signed)
As pt was being prepped to bring to the floor, developed episode of increased work of breathing from baseline.  MD called, douneb ordered, will reevaluate and place on 1 L O2 before being brought to the floor

## 2018-07-10 NOTE — ED Notes (Signed)
Pt returned to baseline, going to floor

## 2018-07-10 NOTE — Progress Notes (Signed)
Pharmacy Antibiotic Note  Stephen [name] is a 62 y.o. male admitted on 07/10/2018 with pneumonia.   Plan: Zosyn 3.375 gm iv q8h Vanc 1 g x 1 then 750 mg q8h Monitor renal fx cx vt prn  Height: 5\' 10"  (177.8 cm) Weight: 154 lb (69.9 kg) IBW/kg (Calculated) : 73  Temp (24hrs), Avg:98.2 F (36.8 C), Min:98.2 F (36.8 C), Max:98.2 F (36.8 C)  Recent Labs  Lab 07/10/18 1425  WBC 9.8  CREATININE 0.80    Estimated Creatinine Clearance: 94.7 mL/min (by C-G formula based on SCr of 0.8 mg/dL).    Allergies  Allergen Reactions  . Codeine Other (See Comments)    Unknown. Mother told him he was allergic  . Cymbalta [Duloxetine Hcl] Nausea And Vomiting  . Montelukast Sodium Other (See Comments)    Causes sinusitis   Levester Fresh, PharmD, BCPS, BCCCP Clinical Pharmacist [phone]  Please check AMION for all [place] numbers  07/10/2018 3:51 PM

## 2018-07-10 NOTE — ED Triage Notes (Signed)
Pt arrived via EMS from PCP office. Pt reported [facility] and was told at MD office he had PNA on Lt side. Pt BP 112/74 , P 76, Temp 97.7. PT reports his normal O2 use is 2 liters at HS. Pt reports he has been using the O2  During  The day recently .

## 2018-07-11 DIAGNOSIS — J189 Pneumonia, unspecified organism: Secondary | ICD-10-CM

## 2018-07-11 LAB — BASIC METABOLIC PANEL
Anion gap: 6 (ref 5–15)
BUN: 8 mg/dL (ref 8–23)
CHLORIDE: 100 mmol/L (ref 98–111)
CO2: 38 mmol/L — AB (ref 22–32)
CREATININE: 0.71 mg/dL (ref 0.61–1.24)
Calcium: 9 mg/dL (ref 8.9–10.3)
GFR calc Af Amer: >60 mL/min (ref >60)
GFR calc non Af Amer: >60 mL/min (ref >60)
GLUCOSE: 143 mg/dL — AB (ref 70–99)
POTASSIUM: 4.3 mmol/L (ref 3.5–5.1)
SODIUM: 144 mmol/L (ref 135–145)

## 2018-07-11 LAB — CBC
HEMATOCRIT: 43.3 % (ref 39.0–52.0)
Hemoglobin: 13.1 g/dL (ref 13.0–17.0)
MCH: 27 pg (ref 26.0–34.0)
MCHC: 30.3 g/dL (ref 30.0–36.0)
MCV: 89.1 fL (ref 78.0–100.0)
PLATELETS: 264 10*3/uL (ref 150–400)
RBC: 4.86 MIL/uL (ref 4.22–5.81)
RDW: 14.2 % (ref 11.5–15.5)
WBC: 7.4 10*3/uL (ref 4.0–10.5)

## 2018-07-11 LAB — PROCALCITONIN: Procalcitonin: <0.1 ng/mL

## 2018-07-11 MED ORDER — TEMAZEPAM 7.5 MG PO CAPS: 7.5000 mg | ORAL_CAPSULE | Freq: Every day | ORAL | Status: DC

## 2018-07-11 MED ORDER — MIRTAZAPINE 15 MG PO TABS: 15.0000 mg | ORAL_TABLET | Freq: Every day | ORAL | 0 refills | Status: DC

## 2018-07-11 MED ORDER — IPRATROPIUM-ALBUTEROL 0.5-2.5 (3) MG/3ML IN SOLN: 3.0000 mL | Freq: Two times a day (BID) | RESPIRATORY_TRACT | Status: DC

## 2018-07-11 MED ORDER — AMOXICILLIN-POT CLAVULANATE 875-125 MG PO TABS: 1.0000 | ORAL_TABLET | Freq: Two times a day (BID) | ORAL | 0 refills | Status: AC

## 2018-07-11 NOTE — Discharge Summary (Signed)
Discharge Summary  Stephen [name] UJW:[id number] DOB: 1956-03-18  PCP: [name], MD  Admit date: 07/10/2018 Discharge date: 07/11/2018  Time spent: 61mins  Recommendations for Outpatient Follow-up:  1. F/u with ENT Dr Redmond Baseman patient states he has an appointment with Dr Redmond Baseman on Thursday 2. F/u with pulmonology Dr Chase Caller  Discharge Diagnoses:  Active Hospital Problems   Diagnosis Date Noted  . Healthcare-associated pneumonia 07/10/2018  . HCAP (healthcare-associated pneumonia) 07/10/2018  . Protein-calorie malnutrition, severe 06/25/2018  . History of lung cancer and throat cancer 06/24/2018  . Malignant neoplasm of glottis ([place]) 01/13/2018  . COPD, severe ([place]) 01/12/2015    Resolved Hospital Problems  No resolved problems to display.    Discharge Condition: stable  Diet recommendation: heart healthy/carb modified  Filed Weights   07/10/18 1330 07/10/18 1851  Weight: 69.9 kg (154 lb) 71.8 kg (158 lb 4.6 oz)    History of present illness: (per admitting MD Dr  PCP: [name], MD  Specialists: Dr. Redmond Baseman with ENT.  Also followed by ENT at [facility], Dr. Nicolette Bang.  Chief Complaint: Pneumonia  HPI: Stephen [name] is a 62 y.o. 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  Also has history of COPD, hypothyroidism, anxiety disorder, nocturnal hypoxemia who was recently hospitalized in June with increased breathing.  Thought to be secondary to COPD exacerbation.  There was also some concern for stridor.  Patient was seen by ENT and underwent transnasal fiberoptic laryngoscopy which showed stable findings.  He was prescribed steroids and was asked to follow-up with ENT in their office.  Patient has an  appointment on Thursday with Dr. Redmond Baseman.  Patient was seen by his cardiologist yesterday who ordered a CT coronary study.  This study suggested a left lower lobe pneumonia.  Patient was seen by his primary care provider who sent him to the ED.  Patient has noticed perhaps slightly more cough than before.  He is bringing up yellowish-greenish expectoration.  Most of this is chronic.  His breathing is about the same.  He denies any dizziness or lightheadedness.  Denies any chest pain currently.  No fever.  He did have an episode of vomiting a few days ago.  He tells me that he does not take special diet.  He was seen by speech therapy during his previous hospitalization and regular diet with thin liquids was recommended.  However patient tells me that he does not always sit upright when he is eating or drinking.  In the emergency department patient was started on broad-spectrum antibiotics due to concern for healthcare associated pneumonia.  He was noted to be hypotensive although he is asymptomatic.    Hospital Course:  Principal Problem:   Healthcare-associated pneumonia Active Problems:   COPD, severe ([place])   Malignant neoplasm of glottis ([place])   History of lung cancer and throat cancer   Protein-calorie malnutrition, severe   HCAP (healthcare-associated pneumonia)  On CT coronary who was ordered by cardiology, showed  "Probable left lower lobe bronchopneumonia, Diffuse bronchial wall thickening with moderate centrilobular and paraseptal emphysema" He was instructed to come to the hospital by his cardiologist. He does not have fever, no leukocytosis, he is hemodynamically stable. His breathing is at baseline, lung exam no wheezing, overall diminished which is consistent with severe  emphysema that is shown on CT. He is not in respiratory distress,  He is at risk of recurrent aspiration , he is discharged on augmentin. Follow up with ENT and pulmonology.   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 He is to continue follow with ENT   COPD , chronic hypoxic respiratory failure on home o2 Lung exam overall diminished , but No wheezing, no leukocytosis, no fever He is to continue home meds, follow up with pulmonology  CAD  Denies chest pain, troponin negative  outpatient follow up with cardiology   FTT: unintentional weight loss of 50pounds this yr. He reports he does not want go through aggressive treatment, I have advised him to discuss with his ENT and pulmonologist. He is also advised to discussed with palliative care about goals of care. He confirmed that he is a DNR.    Procedures:  none  Consultations:  none   Discharge Exam: BP (!) 119/91 (BP Location: Right Arm)   Pulse 93   Temp (!) 97.4 F (36.3 C)   Resp 20   Ht 5\' 10"  (1.778 m)   Wt 71.8 kg (158 lb 4.6 oz)   SpO2 99%   BMI 22.71 kg/m   General: thin, chronically ill, NAD Cardiovascular: RRR Respiratory: very diminished, no wheezing, no rales, no rhonchi  Discharge Instructions You were cared for by a hospitalist during your hospital stay. If you have any questions about your discharge medications or the care you received while you were in the hospital after you are discharged, you can call the unit and asked to speak with the hospitalist on call if the hospitalist that took care of you is not available. Once you are discharged, your primary care physician will handle any further medical issues. Please note that NO REFILLS for any discharge medications will be authorized once you are discharged, as it is imperative that you return to your primary care physician (or establish a relationship with a primary care physician if you do not have one) for your aftercare needs so that they can  reassess your need for medications and monitor your lab values.  Discharge Instructions    Diet general   Complete by:  As directed    Dysphagia 3 (Mech soft);Thin liquid   Liquid Administration via: Cup Medication Administration: Whole meds with puree Supervision: Patient able to self feed Compensations: Slow rate;Small sips/bites;Follow solids with liquid;Effortful swallow   Increase activity slowly   Complete by:  As directed      Allergies as of 07/11/2018      Reactions   Codeine Other (See Comments)   Unknown reaction. Mother told him he was allergic   Cymbalta [duloxetine Hcl] Nausea And Vomiting   Montelukast Sodium Other (See Comments)   Causes sinusitis      Medication List    TAKE these medications   albuterol 108 (90 Base) MCG/ACT inhaler Commonly known as:  PROVENTIL HFA;VENTOLIN HFA Inhale 1-2 puffs into the lungs every 6 (six) hours as needed for wheezing or shortness of breath.   albuterol (2.5 MG/3ML) 0.083% nebulizer solution Commonly known as:  PROVENTIL Take 2.5 mg by nebulization every 6 (six) hours as needed for wheezing or shortness of breath.   amoxicillin-clavulanate 875-125 MG tablet Commonly known as:  AUGMENTIN Take 1  tablet by mouth 2 (two) times daily for 7 days.   aspirin EC 81 MG tablet Take 81 mg by mouth daily.   Fluticasone-Umeclidin-Vilant 100-62.5-25 MCG/INH Aepb Commonly known as:  TRELEGY ELLIPTA Inhale 1 puff into the lungs daily.   Fluticasone-Umeclidin-Vilant 100-62.5-25 MCG/INH Aepb Commonly known as:  TRELEGY ELLIPTA Inhale 1 puff into the lungs daily.   ipratropium-albuterol 0.5-2.5 (3) MG/3ML Soln Commonly known as:  DUONEB Take 3 mLs by nebulization every 6 (six) hours as needed. What changed:  reasons to take this   levothyroxine 125 MCG tablet Commonly known as:  SYNTHROID, LEVOTHROID Take 62.5 mcg by mouth daily before breakfast.   mirtazapine 15 MG tablet Commonly known as:  REMERON Take 1 tablet (15 mg  total) by mouth at bedtime.   multivitamin with minerals Tabs tablet Take 1 tablet by mouth daily. Centrum Silver   nitroGLYCERIN 0.4 MG SL tablet Commonly known as:  NITROSTAT Place 1 tablet (0.4 mg total) under the tongue every 5 (five) minutes as needed for chest pain.   omeprazole 40 MG capsule Commonly known as:  PRILOSEC Take 1 capsule (40 mg total) by mouth daily.   pravastatin 40 MG tablet Commonly known as:  PRAVACHOL Take 40 mg by mouth at bedtime.   predniSONE 20 MG tablet Commonly known as:  DELTASONE Take 3 tablets (60 mg total) by mouth daily. What changed:    how much to take  when to take this  additional instructions   temazepam 15 MG capsule Commonly known as:  RESTORIL Take 30 mg by mouth at bedtime.   Vitamin D (Ergocalciferol) 50000 units Caps capsule Commonly known as:  DRISDOL Take 50,000 Units by mouth every 14 (fourteen) days.      Allergies  Allergen Reactions  . Codeine Other (See Comments)    Unknown reaction. Mother told him he was allergic  . Cymbalta [Duloxetine Hcl] Nausea And Vomiting  . Montelukast Sodium Other (See Comments)    Causes sinusitis   Follow-up Information    Melida Quitter, MD Follow up in 1 week(s).   Specialty:  Otolaryngology Contact information: [address] [place] [address] [phone]        Brand Males, MD Follow up.   Specialty:  Pulmonary Disease Contact information: [place] [address] [phone]            The results of significant diagnostics from this hospitalization (including imaging, microbiology, ancillary and laboratory) are listed below for reference.    Significant Diagnostic Studies: Ct Coronary Morph W/cta Cor W/score W/ca W/cm &/or Wo/cm  Addendum Date: 07/10/2018   ADDENDUM REPORT: 07/10/2018 18:07 CLINICAL DATA:  Chest pain EXAM: Cardiac CTA MEDICATIONS: Sub lingual nitro. 4mg  x 2 and lopressor 5mg  IV TECHNIQUE: The patient  was scanned on a Siemens 956 slice scanner. Gantry rotation speed was 250 msecs. Collimation was 0.8 mm. A 100 kV prospective scan was triggered in the ascending thoracic aorta at 35-75% of the R-R interval. Average HR during the scan was 60 bpm. The 3D data set was interpreted on a dedicated work station using MPR, MIP and VRT modes. A total of 80cc of contrast was used. FINDINGS: Non-cardiac: See separate report from [facility]. Calcium Score: 0 Agatston units. Coronary Arteries: Right dominant with no anomalies LM: No plaque or stenosis. LAD system: There is a discrete area of soft plaque in the proximal LAD with probably mild stenosis. Circumflex system: No plaque or stenosis. RCA system: No  plaque or stenosis. IMPRESSION: 1. Coronary artery calcium score 0 Agatston units, suggesting low risk for future cardiac events. 2. There is an area of soft plaque in the proximal LAD, suspect mild stenosis. I will send for FFR to confirm. Dalton Mclean Electronically Signed   By: Loralie Champagne M.D.   On: 07/10/2018 18:07   Result Date: 07/10/2018 EXAM: OVER-READ INTERPRETATION  CT CHEST The following report is an over-read performed by radiologist Dr. Vinnie Langton of [facility], [place] on 07/09/2018. This over-read does not include interpretation of cardiac or coronary anatomy or pathology. The coronary calcium score/coronary CTA interpretation by the cardiologist is attached. COMPARISON:  Chest CT 05/04/2018. FINDINGS: Areas of architectural distortion are noted in the right lung related to prior right upper lobectomy. Chronic pleural thickening in the lateral aspect of the right hemithorax. Diffuse bronchial wall thickening with moderate centrilobular and paraseptal emphysema. In addition, there is some patchy peribronchovascular ground-glass attenuation noted throughout the left lower lobe, concerning for potential bronchopneumonia. No acute consolidative airspace disease. No pleural effusions. No  lymphadenopathy noted in the visualized portions of the thorax. Visualized portions of the upper abdomen are unremarkable. No aggressive appearing lytic or blastic lesions are noted in the visualized portions of the skeleton. IMPRESSION: 1. Probable left lower lobe bronchopneumonia, as above. 2. Diffuse bronchial wall thickening with moderate centrilobular and paraseptal emphysema. Electronically Signed: By: Vinnie Langton M.D. On: 07/09/2018 15:39   Dg Chest Port 1 View  Result Date: 06/24/2018 CLINICAL DATA:  62 year old male with shortness of breath. EXAM: PORTABLE CHEST 1 VIEW COMPARISON:  Chest radiograph dated 06/13/2018 FINDINGS: Right apical postsurgical changes of lobectomy. There is associated volume loss in the right hemithorax. Emphysematous changes of the lungs. No new consolidative changes. There is no pleural effusion or pneumothorax. Stable cardiomediastinal silhouette. No acute osseous pathology. IMPRESSION: No active disease. Electronically Signed   By: Anner Crete M.D.   On: 06/24/2018 06:11   Dg Chest Portable 1 View  Result Date: 06/13/2018 CLINICAL DATA:  Shortness of breath. EXAM: PORTABLE CHEST 1 VIEW COMPARISON:  Radiograph 05/24/2018, CT 05/04/2018 FINDINGS: Chronic right lung volume loss with stable pleuroparenchymal opacity in surgical clips in the apex. Emphysema. Unchanged heart size and mediastinal contours. No new airspace disease, pleural effusion or pneumothorax. No pulmonary edema. IMPRESSION: 1. No acute findings. 2. Postsurgical change in the right hemithorax. 3. Emphysema. Electronically Signed   By: Jeb Levering M.D.   On: 06/13/2018 05:31    Microbiology: No results found for this or any previous visit (from the past 240 hour(s)).   Labs: Basic Metabolic Panel: Recent Labs  Lab 07/10/18 1425 07/11/18 0506  NA 140 144  K 3.6 4.3  CL 92* 100  CO2 36* 38*  GLUCOSE 76 143*  BUN 5* 8  CREATININE 0.80 0.71  CALCIUM 9.2 9.0   Liver Function  Tests: Recent Labs  Lab 07/10/18 1425  AST 16  ALT 17  ALKPHOS 104  BILITOT 1.0  PROT 6.5  ALBUMIN 3.3*   No results for input(s): LIPASE, AMYLASE in the last 168 hours. No results for input(s): AMMONIA in the last 168 hours. CBC: Recent Labs  Lab 07/10/18 1425 07/11/18 0506  WBC 9.8 7.4  NEUTROABS 7.4  --   HGB 13.4 13.1  HCT 44.8 43.3  MCV 89.4 89.1  PLT 269 264   Cardiac Enzymes: No results for input(s): CKTOTAL, CKMB, CKMBINDEX, TROPONINI in the last 168 hours. BNP: BNP (last 3 results) Recent Labs  06/24/18 0551  BNP 61.4    ProBNP (last 3 results) No results for input(s): PROBNP in the last 8760 hours.  CBG: No results for input(s): GLUCAP in the last 168 hours.     Signed:  Florencia Reasons MD, PhD  [facility] 07/11/2018, 12:55 PM

## 2018-07-11 NOTE — Evaluation (Signed)
Clinical/Bedside Swallow Evaluation Patient Details  Name: Stephen [name] MRN: [id number] Date of Birth: 1956-07-14  Today's Date: 07/11/2018 Time: SLP Start Time (ACUTE ONLY): 13 SLP Stop Time (ACUTE ONLY): 1055 SLP Time Calculation (min) (ACUTE ONLY): 35 min  Past Medical History:  Past Medical History:  Diagnosis Date  . Allergic rhinitis, cause unspecified   . Anxiety   . Blindness of left eye   . Complication of anesthesia    difficulty awaking after colonoscopy   . Depression   . Dyspnea   . GERD (gastroesophageal reflux disease)   . Hypothyroidism   . Lung cancer ([place])    lung ca dx 06, has had chemo and radiation  . Osteoporosis   . Other emphysema ([place])    copd  . Throat cancer ([place])    throat ca dx 2007  . Thrombosed hemorrhoids   . Tubular adenoma of colon 2009  . Unspecified vitamin D deficiency    Past Surgical History:  Past Surgical History:  Procedure Laterality Date  . COLONOSCOPY    . HERNIA REPAIR     as a baby- not sure where hernia was  . KNEE SURGERY Left 1972   .  has hardware  . LUNG LOBECTOMY     RUL  . MICROLARYNGOSCOPY WITH CO2 LASER AND EXCISION OF VOCAL CORD LESION N/A 12/19/2017   Procedure: MICROLARYNGOSCOPY WITH BIOPSY;  Surgeon: Melida Quitter, MD;  Location: [facility];  Service: ENT;  Laterality: N/A;  . ROTATOR CUFF REPAIR Left   . SHOULDER SURGERY Right    rotator cuff  . THROAT SURGERY     Laser surgery for throat cancer  . TRANSTHORACIC ECHOCARDIOGRAM  01/13/2012   EF=>55%; trace MR/TR;    HPI:   Stephen [name] is a 62 y.o. 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  Also has history of COPD, hypothyroidism, anxiety disorder, nocturnal hypoxemia who was recently hospitalized in  June with increased breathing.  Thought to be secondary to COPD exacerbation.  There was also some concern for stridor.  Patient was seen by ENT and underwent transnasal fiberoptic laryngoscopy which showed stable findings.  He was prescribed steroids and was asked to follow-up with ENT in their office.  Patient has an appointment on Thursday with Dr. Redmond Baseman.  Patient was seen by his cardiologist yesterday who ordered a CT coronary study.  This study suggested a left lower lobe pneumonia.  Patient was seen by his primary care provider who sent him to the ED.  Patient has noticed perhaps slightly more cough than before.  He is bringing up yellowish-greenish expectoration.  Most of this is chronic.  His breathing is about the same.  He denies any dizziness or lightheadedness.  Denies any chest pain currently.  No fever.  He did have an episode of vomiting a few days ago.  He tells me that he does not take special diet.  He was seen by speech therapy during his previous hospitalization and regular diet with thin liquids was recommended.  However patient tells me that he does not always sit upright when he is eating or drinking.   Assessment / Plan / Recommendation Clinical Impression  Patient able to give good oral history of illness. He reported having reflux and inconistent complicance with medication. Educated  patient about need for consistency with medication recommended by MD. Nurse also reiterated his medication schedule while in room. Patient tolerated Dys 3, thin liquid for breakfast. Observed him with all consistencies on tray and noted no signs of aspiration.  Recommended he continue this diet and use aspiration precautions (sitting at 90 degrees for entire meal and 30 minutes after for known esophageal dysphagia). Patient asked for exercises to keep his swallow strong (he couldn't remember exercise given to him at [facility] by SLP). Pt was given laryngeal exercise handout and explanation/demonstration.  Exercises were given to maintain laryngeal movement after laryngetomy. No further speech therapy services needed at this time.  SLP Visit Diagnosis: Dysphagia, pharyngoesophageal phase (R13.14)    Aspiration Risk  Mild aspiration risk    Diet Recommendation Dysphagia 3 (Mech soft);Thin liquid   Liquid Administration via: Cup Medication Administration: Whole meds with puree Supervision: Patient able to self feed Compensations: Slow rate;Small sips/bites;Follow solids with liquid;Effortful swallow    Other  Recommendations Oral Care Recommendations: Oral care BID   Follow up Recommendations None             Prognosis Prognosis for Safe Diet Advancement: Good      Swallow Study   General Date of Onset: 07/10/18 HPI:  Stephen [name] is a 62 y.o. 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  Also has history of COPD, hypothyroidism, anxiety disorder, nocturnal hypoxemia who was recently hospitalized in June with increased breathing.  Thought to be secondary to COPD exacerbation.  There was also some concern for stridor.  Patient was seen by ENT and underwent transnasal fiberoptic laryngoscopy which showed stable findings.  He was prescribed steroids and was asked to follow-up with ENT in their office.  Patient has an appointment on Thursday with Dr. Redmond Baseman.  Patient was seen by his cardiologist yesterday who ordered a CT coronary study.  This study suggested a left lower lobe pneumonia.  Patient was seen by his primary care provider who sent him to the ED.  Patient has noticed perhaps slightly more cough than before.  He is bringing up yellowish-greenish expectoration.  Most of this is chronic.  His breathing is about the same.  He denies any  dizziness or lightheadedness.  Denies any chest pain currently.  No fever.  He did have an episode of vomiting a few days ago.  He tells me that he does not take special diet.  He was seen by speech therapy during his previous hospitalization and regular diet with thin liquids was recommended.  However patient tells me that he does not always sit upright when he is eating or drinking. Type of Study: Bedside Swallow Evaluation Diet Prior to this Study: Dysphagia 3 (soft);Thin liquids Temperature Spikes Noted: No Respiratory Status: Nasal cannula History of Recent Intubation: No Behavior/Cognition: Alert;Cooperative;Pleasant mood Oral Cavity Assessment: Within Functional Limits Oral Care Completed by SLP: No Oral Cavity - Dentition: Adequate natural dentition Vision: Functional for self-feeding Self-Feeding Abilities: Able to feed self Patient Positioning: Upright in bed Baseline Vocal Quality: Hoarse Volitional Cough: Strong Volitional Swallow: Able to elicit    Oral/Motor/Sensory Function Overall Oral Motor/Sensory Function: Within functional limits   Ice Chips Ice chips: Within functional limits Presentation: Spoon   Thin Liquid Thin Liquid: Within functional limits Presentation:  Cup;Straw    Nectar Thick Nectar Thick Liquid: Not tested   Honey Thick Honey Thick Liquid: Not tested   Puree Puree: Within functional limits   Solid   GO   Solid: Within functional limits       Gibson, MA, CCC-SLP 07/11/2018 11:08 AM

## 2018-07-14 ENCOUNTER — Encounter: Payer: Self-pay | Admitting: Physician Assistant

## 2018-07-15 LAB — CULTURE, BLOOD (ROUTINE X 2)
CULTURE: NO GROWTH
Culture: NO GROWTH
Special Requests: ADEQUATE
Special Requests: ADEQUATE

## 2018-07-16 DIAGNOSIS — J386 Stenosis of larynx: Secondary | ICD-10-CM | POA: Insufficient documentation

## 2018-08-07 ENCOUNTER — Telehealth: Payer: Self-pay | Admitting: Internal Medicine

## 2018-08-07 MED ORDER — PREDNISONE 10 MG PO TABS: ORAL_TABLET | ORAL | 0 refills | Status: DC

## 2018-08-07 NOTE — Telephone Encounter (Signed)
Prednisone 10 mg take  4 each am x 2 days,   2 each am x 2 days,  1 each am x 2 days and stop   If any purulent sputum : zpak  Needs ov to regoup as Trelegy may not be good choice if having active throat symptoms regardless of cause

## 2018-08-07 NOTE — Telephone Encounter (Signed)
Attempted to call patient today regarding MW recommendations. I did not receive an answer at time of call. I have left a voicemail message for pt to return call. X1

## 2018-08-07 NOTE — Telephone Encounter (Signed)
Called and spoke with patient, advised him of MW response. Patient verbalized understanding. OV has been made. Nothing further needed.

## 2018-08-07 NOTE — Telephone Encounter (Signed)
Spoke with pt, he wants an appt with Dr. Chase Caller. I made the first available appt on August 19 at 4pm. Nothing further is needed.

## 2018-08-07 NOTE — Telephone Encounter (Signed)
Spoke with pt, he is coughing (not much production) and having more SOB, making his back sore. He states Prednisone and ABX usually helps. He denies fever, body aches and stated it is hard for him to talk due to throat cancer. Can we send in Rx? DOD please advise.    [facility] on [address]   Current Outpatient Medications on File Prior to Visit  Medication Sig Dispense Refill  . albuterol (PROVENTIL HFA;VENTOLIN HFA) 108 (90 Base) MCG/ACT inhaler Inhale 1-2 puffs into the lungs every 6 (six) hours as needed for wheezing or shortness of breath.    Marland Kitchen albuterol (PROVENTIL) (2.5 MG/3ML) 0.083% nebulizer solution Take 2.5 mg by nebulization every 6 (six) hours as needed for wheezing or shortness of breath.     Marland Kitchen aspirin EC 81 MG tablet Take 81 mg by mouth daily.    . Fluticasone-Umeclidin-Vilant (TRELEGY ELLIPTA) 100-62.5-25 MCG/INH AEPB Inhale 1 puff into the lungs daily. (Patient not taking: Reported on 07/10/2018) 60 each 2  . Fluticasone-Umeclidin-Vilant (TRELEGY ELLIPTA) 100-62.5-25 MCG/INH AEPB Inhale 1 puff into the lungs daily. 1 each 0  . ipratropium-albuterol (DUONEB) 0.5-2.5 (3) MG/3ML SOLN Take 3 mLs by nebulization every 6 (six) hours as needed. (Patient taking differently: Take 3 mLs by nebulization every 6 (six) hours as needed (shortness of breath). ) 360 mL 0  . levothyroxine (SYNTHROID, LEVOTHROID) 125 MCG tablet Take 62.5 mcg by mouth daily before breakfast.     . mirtazapine (REMERON) 15 MG tablet Take 1 tablet (15 mg total) by mouth at bedtime. 30 tablet 0  . Multiple Vitamin (MULTIVITAMIN WITH MINERALS) TABS tablet Take 1 tablet by mouth daily. Centrum Silver    . nitroGLYCERIN (NITROSTAT) 0.4 MG SL tablet Place 1 tablet (0.4 mg total) under the tongue every 5 (five) minutes as needed for chest pain. 20 tablet 0  . omeprazole (PRILOSEC) 40 MG capsule Take 1 capsule (40 mg total) by mouth daily. 30 capsule 11  . pravastatin (PRAVACHOL) 40 MG tablet Take 40 mg by mouth at bedtime.      . predniSONE (DELTASONE) 20 MG tablet Take 3 tablets (60 mg total) by mouth daily. (Patient taking differently: Take 20-60 mg by mouth See admin instructions. 21 day tapered course: take 3 tablets (60 mg) by mouth daily for 7 days, then take 2 tablets (40 mg) daily for 7 days, then take 1 tablet (20 mg) daily for 7 days, then stop) 15 tablet 0  . temazepam (RESTORIL) 15 MG capsule Take 30 mg by mouth at bedtime.     . Vitamin D, Ergocalciferol, (DRISDOL) 50000 UNITS CAPS Take 50,000 Units by mouth every 14 (fourteen) days.      No current facility-administered medications on file prior to visit.    Allergies  Allergen Reactions  . Codeine Other (See Comments)    Unknown reaction. Mother told him he was allergic  . Cymbalta [Duloxetine Hcl] Nausea And Vomiting  . Montelukast Sodium Other (See Comments)    Causes sinusitis

## 2018-08-07 NOTE — Telephone Encounter (Signed)
Called and spoke with patient, advised him of MW response from other message. Patient verbalized understanding. Nothing further needed.

## 2018-08-07 NOTE — Addendum Note (Signed)
Addended by: Della Goo C on: 08/07/2018 03:51 PM   Modules accepted: Orders

## 2018-08-17 ENCOUNTER — Ambulatory Visit: Payer: 59 | Admitting: Internal Medicine

## 2018-08-18 ENCOUNTER — Encounter: Payer: Self-pay | Admitting: Primary Care

## 2018-08-18 ENCOUNTER — Ambulatory Visit (INDEPENDENT_AMBULATORY_CARE_PROVIDER_SITE_OTHER)
Admission: RE | Admit: 2018-08-18 | Discharge: 2018-08-18 | Disposition: A | Discharge status: Home / Self Care | Payer: 59 | Source: Ambulatory Visit | Attending: Primary Care | Admitting: Primary Care

## 2018-08-18 ENCOUNTER — Ambulatory Visit (INDEPENDENT_AMBULATORY_CARE_PROVIDER_SITE_OTHER): Payer: 59 | Admitting: Primary Care

## 2018-08-18 DIAGNOSIS — J441 Chronic obstructive pulmonary disease with (acute) exacerbation: Secondary | ICD-10-CM

## 2018-08-18 MED ORDER — METHYLPREDNISOLONE ACETATE 80 MG/ML IJ SUSP
80.0000 mg | Freq: Once | INTRAMUSCULAR | Status: AC
Start: 2018-08-18 — End: 2018-08-18
  Administered 2018-08-18: 80 mg via INTRAMUSCULAR

## 2018-08-18 MED ORDER — LEVALBUTEROL HCL 0.63 MG/3ML IN NEBU
0.6300 mg | INHALATION_SOLUTION | Freq: Once | RESPIRATORY_TRACT | Status: AC
  Administered 2018-08-18: 0.63 mg via RESPIRATORY_TRACT

## 2018-08-18 MED ORDER — PREDNISONE 10 MG PO TABS: ORAL_TABLET | ORAL | 0 refills | Status: DC

## 2018-08-18 NOTE — Progress Notes (Signed)
@Patient  ID: Stephen [name], male    DOB: [birth year], 62 y.o.   MRN: [id number]  Chief Complaint  Patient presents with  . Acute Visit    SOB-thick yellow sputum    Referring provider: [name], MD  HPI: 62 year old male, former smoker (quit 2006). PMH moderate/boarderline severe COPD, bronchogenic cancer right lung s/p lobectomy, malignant neoplasm of epiglottis with resection. Patient of Dr. Chase Caller, last seen in office on 05/27/18. Continues on Trelegy and prn albuterol.    Previous [facility] pulmonary encounters:  05/27/18 Chief Complaint  Patient presents with  . Follow-up    follow up for respiratory failure, wheezing is getting better.    Moderate/borderline severe COPD with recurrent exacerbations MZ phenotype.  In the setting of lung cancer status post lobectomy and also remote throat cancer  Last seen July 2018.  Since then there has been significant changes to his health status.  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  Apparently it was significant enough that resection here in [facility] by Dr. Redmond Baseman had to be abandoned and he had to undergo hospitalization and resection and status post tracheostomy at [facility].  He is extremely upset with his healthcare providers including me for missing this diagnosis.  Quick review of the chart on his visits indicate that he has never expressed any recurrence of his hoarseness of voice with me.  He most recently had a lung cancer surveillance scan May 04, 2018 that shows lung cancer to be in remission.  He tells me Dr. Julien Nordmann has discharged him from follow-up.  He takes Trelegy inhaler for his COPD.  He has had multiple exacerbations and this is all documented in the medical record.  Including admissions requiring BiPAP.  It appears he might not be on any nocturnal oxygen.  At this point in time he feels good and his COPD CAT score is 14; low scores he has had.  He is  not interested in retesting for allergies.   08/18/2018 Presents today with complaints of having harder time breathing, states that his has been going on since his throat surgery in February. Complains of breathing spasms since may/june with hard time catching his breath. Freq admissions to ED, most recently on 06/15, 06/26 and 07/12. Just finished prednisone taper prescribed by pulmonary office. Missed appointment with Dr. Chase Caller yesterday because he was not feeling well. States that his breathing becomes exacerbated when he comes of prednisone. He was not wearing oxygen when he came into office today. Placed on 3L, O2 sat 98%. Used rescue inhaler three time this morning. Breathing is labored in office with audible upper airway stridor/wheezing. Able to speak in short sentences. He does not feel that he needs to go to the emergency room. Stat CXR obtained. Given depo-medrol 80mg  injection and xopenex neb treatment, breathing has greatly improved. No significant stridor. Wife present at the end of visit.     Allergies  Allergen Reactions  . Codeine Other (See Comments)    Unknown reaction. Mother told him he was allergic  . Cymbalta [Duloxetine Hcl] Nausea And Vomiting  . Montelukast Sodium Other (See Comments)    Causes sinusitis    Immunization History  Administered Date(s) Administered  . Influenza Split 08/30/2013, 09/08/2014, 09/06/2015  . Influenza Whole 10/01/2010, 10/02/2011, 09/29/2016  . Pneumococcal Conjugate-13 09/08/2014  . Pneumococcal Polysaccharide-23 10/30/2009  . Zoster 09/09/2016    Past Medical History:  Diagnosis Date  .  Allergic rhinitis, cause unspecified   . Anxiety   . Blindness of left eye   . CAD (coronary artery disease)    Coronary CTA 7/19: Calcium score 0, LAD with proximal discrete area of soft plaque;  Mid LAD FFR 0.86 - No hemodynamically significant coronary stenoses by CT FFR.  . Complication of anesthesia    difficulty awaking after colonoscopy    . Depression   . Dyspnea   . GERD (gastroesophageal reflux disease)   . Hypothyroidism   . Lung cancer ([place])    lung ca dx 06, has had chemo and radiation  . Osteoporosis   . Other emphysema ([place])    copd  . Throat cancer ([place])    throat ca dx 2007  . Thrombosed hemorrhoids   . Tubular adenoma of colon 2009  . Unspecified vitamin D deficiency     Tobacco History: Social History   Tobacco Use  Smoking Status Former Smoker  . Packs/day: 1.50  . Years: 32.00  . Pack years: 48.00  . Types: Cigarettes  . Last attempt to quit: 12/30/2004  . Years since quitting: 13.6  Smokeless Tobacco Never Used  Tobacco Comment   1 ppd x 30 years   Counseling given: Not Answered Comment: 1 ppd x 30 years   Outpatient Medications Prior to Visit  Medication Sig Dispense Refill  . albuterol (PROVENTIL HFA;VENTOLIN HFA) 108 (90 Base) MCG/ACT inhaler Inhale 1-2 puffs into the lungs every 6 (six) hours as needed for wheezing or shortness of breath.    Marland Kitchen albuterol (PROVENTIL) (2.5 MG/3ML) 0.083% nebulizer solution Take 2.5 mg by nebulization every 6 (six) hours as needed for wheezing or shortness of breath.     Marland Kitchen aspirin EC 81 MG tablet Take 81 mg by mouth daily.    . Fluticasone-Umeclidin-Vilant (TRELEGY ELLIPTA) 100-62.5-25 MCG/INH AEPB Inhale 1 puff into the lungs daily. (Patient not taking: Reported on 07/10/2018) 60 each 2  . Fluticasone-Umeclidin-Vilant (TRELEGY ELLIPTA) 100-62.5-25 MCG/INH AEPB Inhale 1 puff into the lungs daily. 1 each 0  . ipratropium-albuterol (DUONEB) 0.5-2.5 (3) MG/3ML SOLN Take 3 mLs by nebulization every 6 (six) hours as needed. (Patient taking differently: Take 3 mLs by nebulization every 6 (six) hours as needed (shortness of breath). ) 360 mL 0  . levothyroxine (SYNTHROID, LEVOTHROID) 125 MCG tablet Take 62.5 mcg by mouth daily before breakfast.     . mirtazapine (REMERON) 15 MG tablet Take 1 tablet (15 mg total) by mouth at bedtime. 30 tablet 0  . Multiple Vitamin  (MULTIVITAMIN WITH MINERALS) TABS tablet Take 1 tablet by mouth daily. Centrum Silver    . nitroGLYCERIN (NITROSTAT) 0.4 MG SL tablet Place 1 tablet (0.4 mg total) under the tongue every 5 (five) minutes as needed for chest pain. 20 tablet 0  . omeprazole (PRILOSEC) 40 MG capsule Take 1 capsule (40 mg total) by mouth daily. 30 capsule 11  . pravastatin (PRAVACHOL) 40 MG tablet Take 40 mg by mouth at bedtime.     . temazepam (RESTORIL) 15 MG capsule Take 30 mg by mouth at bedtime.     . Vitamin D, Ergocalciferol, (DRISDOL) 50000 UNITS CAPS Take 50,000 Units by mouth every 14 (fourteen) days.     . predniSONE (DELTASONE) 10 MG tablet take 4 each am x 2 days,   2 each am x 2 days,  1 each am x 2 days and stop 16 tablet 0  . predniSONE (DELTASONE) 20 MG tablet Take 3 tablets (60 mg total) by mouth  daily. (Patient taking differently: Take 20-60 mg by mouth See admin instructions. 21 day tapered course: take 3 tablets (60 mg) by mouth daily for 7 days, then take 2 tablets (40 mg) daily for 7 days, then take 1 tablet (20 mg) daily for 7 days, then stop) 15 tablet 0   No facility-administered medications prior to visit.     Review of Systems  Review of Systems  Constitutional: Negative.   HENT: Negative.   Respiratory: Positive for chest tightness, shortness of breath and wheezing. Negative for apnea, cough and choking.   Cardiovascular: Negative.   Skin: Negative.     Physical Exam  BP 96/64 (BP Location: Right Arm, Cuff Size: Normal)   Pulse 97   Ht 5\' 10"  (1.778 m)   Wt 152 lb 6.4 oz (69.1 kg)   SpO2 98%   BMI 21.87 kg/m  Physical Exam  Constitutional:  Thin elderly male, chronically ill appearing   HENT:  Head: Normocephalic and atraumatic.  Eyes: Pupils are equal, round, and reactive to light. EOM are normal.  Neck: Normal range of motion. Neck supple.  Cardiovascular: Normal rate and regular rhythm.  Pulmonary/Chest: He has wheezes.  LS with insp/exp wheeze, scattered rhonchi.  Upper airway stridor. Labored breathing. 02 sat 98% 3L.   Skin: Skin is warm.  Psychiatric: Judgment and thought content normal.  Anxious     Lab Results:  CBC    Component Value Date/Time   WBC 7.4 07/11/2018 0506   RBC 4.86 07/11/2018 0506   HGB 13.1 07/11/2018 0506   HGB 15.3 06/09/2017 1116   HCT 43.3 07/11/2018 0506   HCT 44.8 06/09/2017 1116   PLT 264 07/11/2018 0506   PLT 168 06/09/2017 1116   MCV 89.1 07/11/2018 0506   MCV 84.2 06/09/2017 1116   MCH 27.0 07/11/2018 0506   MCHC 30.3 07/11/2018 0506   RDW 14.2 07/11/2018 0506   RDW 13.6 06/09/2017 1116   LYMPHSABS 1.3 07/10/2018 1425   LYMPHSABS 0.8 (L) 06/09/2017 1116   MONOABS 0.9 07/10/2018 1425   MONOABS 0.6 06/09/2017 1116   EOSABS 0.1 07/10/2018 1425   EOSABS 0.2 06/09/2017 1116   BASOSABS 0.1 07/10/2018 1425   BASOSABS 0.1 06/09/2017 1116    BMET    Component Value Date/Time   NA 144 07/11/2018 0506   NA 140 06/09/2017 1116   K 4.3 07/11/2018 0506   K 4.0 06/09/2017 1116   CL 100 07/11/2018 0506   CL 106 09/14/2012 0808   CO2 38 (H) 07/11/2018 0506   CO2 28 06/09/2017 1116   GLUCOSE 143 (H) 07/11/2018 0506   GLUCOSE 97 06/09/2017 1116   GLUCOSE 98 09/14/2012 0808   BUN 8 07/11/2018 0506   BUN 16.0 06/09/2017 1116   CREATININE 0.71 07/11/2018 0506   CREATININE 1.0 06/09/2017 1116   CALCIUM 9.0 07/11/2018 0506   CALCIUM 9.8 06/09/2017 1116   GFRNONAA >60 07/11/2018 0506   GFRAA >60 07/11/2018 0506    BNP    Component Value Date/Time   BNP 61.4 06/24/2018 0551    ProBNP No results found for: PROBNP  Imaging: Dg Chest 2 View  Result Date: 08/18/2018 CLINICAL DATA:  Worsening dyspnea.  History of COPD and lung cancer. EXAM: CHEST - 2 VIEW COMPARISON:  06/24/2018 FINDINGS: Postsurgical changes of right upper lobectomy are again seen with right hemithorax volume loss and rightward shift of the superior mediastinal structures, unchanged. The cardiac silhouette is normal in size. The left  lateral costophrenic angle was  incompletely imaged. The left lung is clear. Underlying emphysematous changes are noted. No sizable pleural effusion or pneumothorax is identified. No acute osseous abnormality is seen. IMPRESSION: Postsurgical changes without evidence of active cardiopulmonary disease. Electronically Signed   By: Logan Bores M.D.   On: 08/18/2018 10:38     Assessment & Plan:  61 year old male, former smoker. Hx moderate/boarderline severe COPD, Lung cancer s/p right upper lobectomy and throat cancer with resection. Presents today with worsening sob. O2 sat 98% on 3L. Breathing appears labored in office with audible upper airway stridor/wheeze. Patient was given depo-medrol 80mg  IM x1 and xopenex neb treatment. Breathing improved significantly. No audible stridor or wheeze. CXR showed postsurgical changes, no acute cardiopulmonary disease. Patient declined ED evaluation and feels comfortable going home with prednisone taper. Patient has fu visit with ENT on sept 19th. Will fu with Dr. Chase Caller in 2-3 weeks and/or as needed. Patient knows to present to ED if symptoms worsen.   COPD with acute exacerbation ([place]) - CXR showing postsurgical changes, no acute cardiopulmonary disease - Received Depo-Medrol 80mg  IM and xopenex neb in office with improvement in breathing  - Start extended prednisone taper (40mg  x 4 days, 30mg  x4 days, 20mg  x 4 days, 10mg  x 4 days)  - Recommend flutter valve TID, mucinex and delsym  - FU with Dr. Chase Caller in 2-3 weeks   COPD (chronic obstructive pulmonary disease) - Continue Trelegy and prn Aulbuterol as prescribed - Dr. Chase Caller does not think breathing is exacerbated d/t inhaler use, more specific to recent postsurgical changes. Needs to follow up with ENT. He had previously been apart of drug trial and patient had experienced a decrease in flares and wheezing. Patient would like to stay on Trelegy for now    >40 mins spent on case; patient was given IM  steroid injection and neb treatment, obtained stat CXR and interpreted results before discharging patient   Martyn Ehrich, NP  08/18/2018

## 2018-08-18 NOTE — Assessment & Plan Note (Addendum)
-   CXR showing postsurgical changes, no acute cardiopulmonary disease - Received Depo-Medrol 80mg  IM and xopenex neb in office with improvement in breathing  - Start extended prednisone taper (40mg  x 4 days, 30mg  x4 days, 20mg  x 4 days, 10mg  x 4 days)  - Recommend flutter valve TID, mucinex and delsym  - FU with Dr. Chase Caller in 2-3 weeks

## 2018-08-18 NOTE — Patient Instructions (Addendum)
Sending prednisone taper to pharmacy CXR showed post surgical changes, no acute disease  Use flutter valve three times a day Continue mucinex Add delsym cough syrup twice a day Start regular Claritin  Follow up with ENT, next apt sept 19th Needs to see Dr. Chase Caller in the next 2-3 weeks please   If symptoms worsen please present to ED

## 2018-08-18 NOTE — Assessment & Plan Note (Addendum)
-   Continue Trelegy and prn Aulbuterol as prescribed - Dr. Chase Caller does not think breathing is exacerbated d/t inhaler use, more specific to recent postsurgical changes. Needs to follow up with ENT. He had previously been apart of drug trial and patient had experienced a decrease in flares and wheezing. Patient would like to stay on Trelegy for now

## 2018-09-21 ENCOUNTER — Ambulatory Visit (INDEPENDENT_AMBULATORY_CARE_PROVIDER_SITE_OTHER): Payer: 59 | Admitting: Internal Medicine

## 2018-09-21 ENCOUNTER — Encounter: Payer: Self-pay | Admitting: Internal Medicine

## 2018-09-21 VITALS — BP 126/82 | HR 72 | Wt 149.8 lb

## 2018-09-21 DIAGNOSIS — Z85819 Personal history of malignant neoplasm of unspecified site of lip, oral cavity, and pharynx: Secondary | ICD-10-CM

## 2018-09-21 DIAGNOSIS — J449 Chronic obstructive pulmonary disease, unspecified: Secondary | ICD-10-CM | POA: Diagnosis not present

## 2018-09-21 MED ORDER — IPRATROPIUM-ALBUTEROL 0.5-2.5 (3) MG/3ML IN SOLN: 3.0000 mL | Freq: Four times a day (QID) | RESPIRATORY_TRACT | 11 refills | Status: DC | PRN

## 2018-09-21 NOTE — Patient Instructions (Addendum)
ICD-10-CM   1. Stage 3 severe COPD by GOLD classification ([place]) J44.9   2. History of throat cancer Z85.819     Stable copd Symptoms appear from narrow upper airway CXR Aug 2019 is clear  Plan - refill duoneb - continue trelegy and other inhalers - glad you had flu shot  - continue tracheostomy discussions with ENT doc - I support you having it if you chose that route  Followup 3 months or sooner if needed

## 2018-09-21 NOTE — Progress Notes (Signed)
OV 01/27/2017  Chief Complaint  Patient presents with  . Follow-up    Breathing is overall doing well. No new co's today. He     Follow-up moderate to severe COPD with a history of recurrent exacerbations and MZ phenotype  Since his last visit he continues to be stable. There are no new exacerbations. He did see Dr. Julien Nordmann oncologist 12/09/2016. I reviewed that note and summarized the fact that he has no recurrence and a follow-up CT chest is planned for June 2018. Most recent CT chest is from June 2017 and this is reviewed below. I did not visualize this film. For his recurrent exacerbations we did discuss alpha-1 replacement but we opted to take Daliresp. However he tells me that he is mostly noncompliant with it. He is willing to retry this. He is up-to-date with his flu shot.  CT chest June 2017: IMPRESSION: 1. No significant change in treatment effects within the right hemi thorax, including right upper lobectomy and presumed radiation induced consolidation at the right apex. 2. centrilobular emphysema, without other explanation for patient's symptoms. 3. Similar right middle lobe pulmonary nodule, favoring a benign etiology. 4. Cholelithiasis.   Electronically Signed   By: Abigail Miyamoto M.D.   On: 06/04/2016 09:33   OV 07/25/2017  Chief Complaint  Patient presents with  . Follow-up    pt c/o "URI" since 03/19/17.  pt c/o sob with exertion, prod cough with green/yellow mucus, fatigue.     Moderate COPD with recurrent exacerbation history MZ phenotype in the setting of lung cancer status post lobectomy  June 2018 he did have a CT scan that showed continued remission. Apparently been discharged from oncology follow-up. However for the last few months he's having recurrent exacerbations associated with sinusitis. He feels roflumilast did not help him. He is compliant with the Spiriva and Symbicort. He is frustrated by his recurrent exacerbations. He feels it is a sinus  causing these issues. Last CT scan of the sinus was in 2008. He says that when he was in allergy shots with Dr. Donneta Romberg symptoms were better. When he came off the allergy shots to go on the [place] which actually didn't help him. He was better while he was in the trial and is interested int rying Trelegy  Noted: When he walked out of the office he saw RN Alethia Berthold and made comments, "hey married girl are you stil married?". I advised him to mind his language and maintain decorum. He replied, "Ok but then aadded "these days we cannot peek and play". This is not the first time he has made comments of this nature on this RN. In fact is for this very reason he was largely worked up by another [facility] 05/27/2018  Chief Complaint  Patient presents with  . Follow-up    follow up for respiratory failure, wheezing is getting better.     Moderate/borderline severe COPD with recurrent exacerbations MZ phenotype.  In the setting of lung cancer status post lobectomy and also remote throat cancer  Last seen July 2018.  Since then there has been significant changes to his health status.  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  Apparently it was significant enough that resection here in [facility] by Dr. Redmond Baseman had to be abandoned and he had to undergo hospitalization and resection and status post tracheostomy at [facility].  He  is extremely upset with his healthcare providers including me for missing this diagnosis.  Quick review of the chart on his visits indicate that he has never expressed any recurrence of his hoarseness of voice with me.  He most recently had a lung cancer surveillance scan May 04, 2018 that shows lung cancer to be in remission.  He tells me Dr. Julien Nordmann has discharged him from follow-up.  He takes Trelegy inhaler for his COPD.  He has had multiple exacerbations and this is all documented in the  medical record.  Including admissions requiring BiPAP.  It appears he might not be on any nocturnal oxygen.  At this point in time he feels good and his COPD CAT score is 14; low scores he has had.  He is not interested in retesting for allergies.  OV 09/21/2018  Subjective:  Patient ID: DYLANN GALLIER, male , DOB: 10-13-1956 , age 62 y.o. , MRN: [id number] , ADDRESS: [place] [address]   09/21/2018 -   Chief Complaint  Patient presents with  . Follow-up    Pt has been having more problems with SOB.   Moderate/borderline severe COPD with recurrent exacerbations MZ phenotype.  In the setting of lung cancer status post lobectomy and also recurrent throat cancer  HPI Jaskarn Schweer Jasek 62 y.o. - returns for COPD follow-up. In the interim he has continued to lose weight. His main issue is that he has upper airway hoarseness and stridor his breathing. He says he saw ENT at [facility] for his throat cancer and his upper airway is narrowed. They've discussed tracheostomy but he is holding off.He does not have dysphagia but he is extremely careful with how he eats. He takes precautions of mixing solids and liquids and not talking while eating. So far no aspiration. August 2019 chest x-ray is clear per report. In terms of his COPD says up-to-date with his flu shot and compliant with his inhalers. He wants refill on his DuoNeb. He does not feel he is in an exacerbation.     CAT COPD Symptom & Quality of Life Score (GSK trademark) 0 is no burden. 5 is highest burden 2015 07/25/2017  05/27/2018   Never Cough -> Cough all the time  4 2  No phlegm in chest -> Chest is full of phlegm  3 0  No chest tightness -> Chest feels very tight  3 0  No dyspnea for 1 flight stairs/hill -> Very dyspneic for 1 flight of stairs  4 2  No limitations for ADL at home -> Very limited with ADL at home  5 5  Confident leaving home -> Not at all confident leaving home  0 1  Sleep soundly -> Do not  sleep soundly because of lung condition  3 0  Lots of Energy -> No energy at all  4 4  TOTAL Score (max 40)  26 26 14      ROS - per HPI     has a past medical history of Allergic rhinitis, cause unspecified, Anxiety, Blindness of left eye, CAD (coronary artery disease), Complication of anesthesia, Depression, Dyspnea, GERD (gastroesophageal reflux disease), Hypothyroidism, Lung cancer ([place]), Osteoporosis, Other emphysema ([place]), Throat cancer ([place]), Thrombosed hemorrhoids, Tubular adenoma of colon (2009), and Unspecified vitamin D deficiency.   reports that he quit smoking about 13 years ago. His smoking use included cigarettes. He has a 48.00 pack-year smoking history. He has never used smokeless tobacco.  Past Surgical History:  Procedure Laterality  Date  . COLONOSCOPY    . HERNIA REPAIR     as a baby- not sure where hernia was  . KNEE SURGERY Left 1972   .  has hardware  . LUNG LOBECTOMY     RUL  . MICROLARYNGOSCOPY WITH CO2 LASER AND EXCISION OF VOCAL CORD LESION N/A 12/19/2017   Procedure: MICROLARYNGOSCOPY WITH BIOPSY;  Surgeon: Melida Quitter, MD;  Location: [place];  Service: ENT;  Laterality: N/A;  . ROTATOR CUFF REPAIR Left   . SHOULDER SURGERY Right    rotator cuff  . THROAT SURGERY     Laser surgery for throat cancer  . TRANSTHORACIC ECHOCARDIOGRAM  01/13/2012   EF=>55%; trace MR/TR;     Allergies  Allergen Reactions  . Codeine Other (See Comments)    Unknown reaction. Mother told him he was allergic  . Cymbalta [Duloxetine Hcl] Nausea And Vomiting  . Montelukast Sodium Other (See Comments)    Causes sinusitis    Immunization History  Administered Date(s) Administered  . Influenza Split 08/30/2013, 09/08/2014, 09/06/2015  . Influenza Whole 10/01/2010, 10/02/2011, 09/29/2016  . Pneumococcal Conjugate-13 09/08/2014  . Pneumococcal Polysaccharide-23 10/30/2009  . Zoster 09/09/2016    Family History  Problem Relation Age of Onset  . Cancer Mother         Brain tumor  . COPD Mother   . Heart disease Father        CABG  . Prostate cancer Father   . Cystic fibrosis Sister        also heart failure  . Mental illness Brother   . Heart failure Paternal Grandmother   . Colon cancer Neg Hx   . Colon polyps Neg Hx   . Esophageal cancer Neg Hx      Current Outpatient Medications:  .  albuterol (PROVENTIL HFA;VENTOLIN HFA) 108 (90 Base) MCG/ACT inhaler, Inhale 1-2 puffs into the lungs every 6 (six) hours as needed for wheezing or shortness of breath., Disp: , Rfl:  .  albuterol (PROVENTIL) (2.5 MG/3ML) 0.083% nebulizer solution, Take 2.5 mg by nebulization every 6 (six) hours as needed for wheezing or shortness of breath. , Disp: , Rfl:  .  aspirin EC 81 MG tablet, Take 81 mg by mouth daily., Disp: , Rfl:  .  Fluticasone-Umeclidin-Vilant (TRELEGY ELLIPTA) 100-62.5-25 MCG/INH AEPB, Inhale 1 puff into the lungs daily., Disp: 60 each, Rfl: 2 .  ipratropium-albuterol (DUONEB) 0.5-2.5 (3) MG/3ML SOLN, Take 3 mLs by nebulization every 6 (six) hours as needed. (Patient taking differently: Take 3 mLs by nebulization every 6 (six) hours as needed (shortness of breath). ), Disp: 360 mL, Rfl: 0 .  levothyroxine (SYNTHROID, LEVOTHROID) 125 MCG tablet, Take 62.5 mcg by mouth daily before breakfast. , Disp: , Rfl:  .  mirtazapine (REMERON) 15 MG tablet, Take 1 tablet (15 mg total) by mouth at bedtime., Disp: 30 tablet, Rfl: 0 .  Multiple Vitamin (MULTIVITAMIN WITH MINERALS) TABS tablet, Take 1 tablet by mouth daily. Centrum Silver, Disp: , Rfl:  .  omeprazole (PRILOSEC) 40 MG capsule, Take 1 capsule (40 mg total) by mouth daily., Disp: 30 capsule, Rfl: 11 .  pravastatin (PRAVACHOL) 40 MG tablet, Take 40 mg by mouth at bedtime. , Disp: , Rfl:  .  temazepam (RESTORIL) 15 MG capsule, Take 30 mg by mouth at bedtime. , Disp: , Rfl:  .  Vitamin D, Ergocalciferol, (DRISDOL) 50000 UNITS CAPS, Take 50,000 Units by mouth every 14 (fourteen) days. , Disp: , Rfl:  .   nitroGLYCERIN (  NITROSTAT) 0.4 MG SL tablet, Place 1 tablet (0.4 mg total) under the tongue every 5 (five) minutes as needed for chest pain. (Patient not taking: Reported on 09/21/2018), Disp: 20 tablet, Rfl: 0      Objective:   Vitals:   09/21/18 1035  BP: 126/82  Pulse: 72  SpO2: 95%  Weight: 149 lb 12.8 oz (67.9 kg)    Estimated body mass index is 21.49 kg/m as calculated from the following:   Height as of 08/18/18: 5\' 10"  (1.778 m).   Weight as of this encounter: 149 lb 12.8 oz (67.9 kg).  @WEIGHTCHANGE @  [employer]   09/21/18 1035  Weight: 149 lb 12.8 oz (67.9 kg)     Physical Exam  General Appearance:    Alert, cooperative, no distress, appears stated age - looks older , sitting on - chari , Deconditioned looking - yes  Head:    Normocephalic, without obvious abnormality, atraumatic  Eyes:    PERRL, conjunctiva/corneas clear,  Ears:    Normal TM's and external ear canals, both ears  Nose:   Nares normal, septum midline, mucosa normal, no drainage    or sinus tenderness. OXYGEN ON  - no . Patient is @ ra   Throat:   Lips, mucosa, and tongue normal; teeth and gums normal. Cyanosis on lips - no HOARSE VOICE. MILD ACC MUSCLE USE IN NECK  Neck:   Supple, symmetrical, trachea midline, no adenopathy;    thyroid:  no enlargement/tenderness/nodules; no carotid   bruit or JVD   Back:     Symmetric, no curvature, ROM normal, no CVA tenderness  Lungs:     Distress - no , Wheeze no, Barrell Chest - no, Purse lip breathing - YES, Crackles - no   Chest Wall:    No tenderness or deformity. Scars in chest no   Heart:    Regular rate and rhythm, S1 and S2 normal, no rub   or gallop, Murmur - no  Breast Exam:    NOT DONE  Abdomen:     Soft, non-tender, bowel sounds active all four quadrants,    no masses, no organomegaly  Genitalia:   NOT DONE  Rectal:   NOT DONE  Extremities:   Extremities normal, atraumatic, Clubbing - no, Edema - no  Pulses:   2+ and symmetric all extremities    Skin:   Stigmata of Connective Tissue Disease - no  Lymph nodes:   Cervical, supraclavicular, and axillary nodes normal  Psychiatric:  Neurologic:   pleasant  CAm-ICU - ndg, Alert and Oriented x 3 - yes, Moves all 4s - yes, Speech - norma, Cognition - normal           Assessment:       ICD-10-CM   1. Stage 3 severe COPD by GOLD classification ([place]) J44.9   2. History of throat cancer Z85.819        Plan:          ICD-10-CM   1. Stage 3 severe COPD by GOLD classification ([place]) J44.9   2. History of throat cancer Z85.819     Stable copd Symptoms appear from narrow upper airway CXR Aug 2019 is clear  Plan - refill duoneb - continue trelegy and other inhalers - glad you had flu shot  - continue tracheostomy discussions with ENT doc - I support you having it if you chose that route  Followup 3 months or sooner if needed      SIGNATURE  Dr. Brand Males, M.D., F.C.C.P,  Pulmonary and Critical Care Medicine Staff Physician, [place] Director - Interstitial Lung Disease  Program  P[facility] [place] at [place], [place], [address]  Pager: [phone], If no answer or between  15:00h - 7:00h: call [phone] Telephone: [phone]  10:58 AM 09/21/2018

## 2018-09-26 ENCOUNTER — Encounter (HOSPITAL_COMMUNITY): Payer: Self-pay

## 2018-09-26 ENCOUNTER — Emergency Department (HOSPITAL_COMMUNITY): Payer: 59

## 2018-09-26 ENCOUNTER — Other Ambulatory Visit: Payer: Self-pay

## 2018-09-26 ENCOUNTER — Emergency Department (HOSPITAL_COMMUNITY)
Admission: EM | Admit: 2018-09-26 | Discharge: 2018-09-26 | Disposition: A | Discharge status: Home / Self Care | Payer: 59 | Attending: Emergency Medicine | Admitting: Emergency Medicine

## 2018-09-26 DIAGNOSIS — Z79899 Other long term (current) drug therapy: Secondary | ICD-10-CM | POA: Insufficient documentation

## 2018-09-26 DIAGNOSIS — I2510 Atherosclerotic heart disease of native coronary artery without angina pectoris: Secondary | ICD-10-CM | POA: Diagnosis not present

## 2018-09-26 DIAGNOSIS — Z7982 Long term (current) use of aspirin: Secondary | ICD-10-CM | POA: Diagnosis not present

## 2018-09-26 DIAGNOSIS — E039 Hypothyroidism, unspecified: Secondary | ICD-10-CM | POA: Insufficient documentation

## 2018-09-26 DIAGNOSIS — Z87891 Personal history of nicotine dependence: Secondary | ICD-10-CM | POA: Diagnosis not present

## 2018-09-26 DIAGNOSIS — J441 Chronic obstructive pulmonary disease with (acute) exacerbation: Secondary | ICD-10-CM | POA: Diagnosis not present

## 2018-09-26 DIAGNOSIS — Z85118 Personal history of other malignant neoplasm of bronchus and lung: Secondary | ICD-10-CM | POA: Diagnosis not present

## 2018-09-26 DIAGNOSIS — R0602 Shortness of breath: Secondary | ICD-10-CM | POA: Diagnosis present

## 2018-09-26 LAB — BASIC METABOLIC PANEL
Anion gap: 9 (ref 5–15)
BUN: 5 mg/dL — ABNORMAL LOW (ref 8–23)
CO2: 31 mmol/L (ref 22–32)
Calcium: 8.9 mg/dL (ref 8.9–10.3)
Chloride: 97 mmol/L — ABNORMAL LOW (ref 98–111)
Creatinine, Ser: 0.8 mg/dL (ref 0.61–1.24)
GFR calc Af Amer: >60 mL/min (ref >60)
GFR calc non Af Amer: >60 mL/min (ref >60)
Glucose, Bld: 121 mg/dL — ABNORMAL HIGH (ref 70–99)
Potassium: 3.2 mmol/L — ABNORMAL LOW (ref 3.5–5.1)
Sodium: 137 mmol/L (ref 135–145)

## 2018-09-26 LAB — CBC WITH DIFFERENTIAL/PLATELET
Abs Immature Granulocytes: 0 10*3/uL (ref 0.0–0.1)
Basophils Absolute: 0.1 10*3/uL (ref 0.0–0.1)
Basophils Relative: 1 %
Eosinophils Absolute: 0.1 10*3/uL (ref 0.0–0.7)
Eosinophils Relative: 1 %
HCT: 43 % (ref 39.0–52.0)
Hemoglobin: 13.5 g/dL (ref 13.0–17.0)
Immature Granulocytes: 0 %
Lymphocytes Relative: 7 %
Lymphs Abs: 0.8 10*3/uL (ref 0.7–4.0)
MCH: 28.5 pg (ref 26.0–34.0)
MCHC: 31.4 g/dL (ref 30.0–36.0)
MCV: 90.7 fL (ref 78.0–100.0)
Monocytes Absolute: 0.9 10*3/uL (ref 0.1–1.0)
Monocytes Relative: 8 %
Neutro Abs: 9.8 10*3/uL — ABNORMAL HIGH (ref 1.7–7.7)
Neutrophils Relative %: 83 %
Platelets: 185 10*3/uL (ref 150–400)
RBC: 4.74 MIL/uL (ref 4.22–5.81)
RDW: 12.5 % (ref 11.5–15.5)
WBC: 11.7 10*3/uL — ABNORMAL HIGH (ref 4.0–10.5)

## 2018-09-26 MED ORDER — PREDNISONE 50 MG PO TABS: 50.0000 mg | ORAL_TABLET | Freq: Every day | ORAL | 0 refills | Status: DC

## 2018-09-26 MED ORDER — DOXYCYCLINE HYCLATE 100 MG PO CAPS: 100.0000 mg | ORAL_CAPSULE | Freq: Two times a day (BID) | ORAL | 0 refills | Status: DC

## 2018-09-26 NOTE — Discharge Instructions (Signed)
I would follow-up with your ENT doctor and pulmonologist as soon as possible.  Return here for any worsening in your condition.

## 2018-09-26 NOTE — ED Notes (Signed)
Pt. Stated "tell the dr. [name] ready to roll."

## 2018-09-26 NOTE — ED Notes (Signed)
Pt discharged from ED; instructions provided and scripts given; Pt encouraged to return to ED if symptoms worsen and to f/u with PCP; Pt verbalized understanding of all instructions 

## 2018-09-26 NOTE — ED Provider Notes (Signed)
[facility] EMERGENCY DEPARTMENT Provider Note   CSN: [id number] Arrival date & time: 09/26/18  7893     History   Chief Complaint Chief Complaint  Patient presents with  . Shortness of Breath    HPI Stephen [name] is a 62 y.o. male.  HPI Patient presents to the emergency department with shortness of breath that started several days ago got worse this morning.  The patient states that he has chronic breathing troubles due to narrowed airway from previous cancer resection and also COPD.  Patient states that he was given albuterol by EMS and the fire department along with steroids and is feeling some better.  Patient states that nothing seemed to make the condition better states that exertion seem to make it somewhat worse.  Patient states that his inhalers and neb treatments were not seem to help at home.  Patient states he does feel like he needs to be back on steroids.  The patient denies chest pain, headache,blurred vision, neck pain, fever, cough, weakness, numbness, dizziness, anorexia, edema, abdominal pain, nausea, vomiting, diarrhea, rash, back pain, dysuria, hematemesis, bloody stool, near syncope, or syncope. Past Medical History:  Diagnosis Date  . Allergic rhinitis, cause unspecified   . Anxiety   . Blindness of left eye   . CAD (coronary artery disease)    Coronary CTA 7/19: Calcium score 0, LAD with proximal discrete area of soft plaque;  Mid LAD FFR 0.86 - No hemodynamically significant coronary stenoses by CT FFR.  . Complication of anesthesia    difficulty awaking after colonoscopy   . Depression   . Dyspnea   . GERD (gastroesophageal reflux disease)   . Hypothyroidism   . Lung cancer ([place])    lung ca dx 06, has had chemo and radiation  . Osteoporosis   . Other emphysema ([place])    copd  . Throat cancer ([place])    throat ca dx 2007  . Thrombosed hemorrhoids   . Tubular adenoma of colon 2009  . Unspecified vitamin D deficiency     Patient  Active Problem List   Diagnosis Date Noted  . Healthcare-associated pneumonia 07/10/2018  . HCAP (healthcare-associated pneumonia) 07/10/2018  . Protein-calorie malnutrition, severe 06/25/2018  . Acute exacerbation of chronic obstructive pulmonary disease (COPD) ([place]) 06/24/2018  . Hypothyroidism 06/24/2018  . Esophageal dysmotility 06/24/2018  . GERD (gastroesophageal reflux disease) 06/24/2018  . History of lung cancer and throat cancer 06/24/2018  . FTT (failure to thrive) in adult 06/24/2018  . Abnormal echocardiogram 06/24/2018  . Hyperlipidemia 05/22/2018  . CAD (coronary artery disease)   . COPD with acute exacerbation ([place]) 05/05/2018  . Elevated troponin 05/04/2018  . Acute respiratory failure with hypoxia ([place]) 05/04/2018  . 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 Alpha-1-antitrypsin deficiency carrier ([place]) 07/17/2016  . Eosinophilia 05/30/2016  . History of seasonal allergies 05/30/2016  . Stopped smoking with greater than 40 pack year history 05/30/2016  . History of lobectomy of lung 05/30/2016  . COLD (chronic obstructive lung disease) ([place]) 07/27/2015  . History of elevated PSA 07/27/2015  . COPD exacerbation ([place]) 01/23/2015  . COPD, severe ([place]) 01/12/2015  . Research study patient 10/07/2014  . Need for prophylactic vaccination against Streptococcus pneumoniae (pneumococcus) 09/15/2014  . Chest pain 08/09/2013  . History of TIA (transient ischemic attack) 08/09/2013  . Otitis media of left ear 08/09/2013  . Bronchogenic cancer of right lung ([place]) 09/16/2012  . Hemorrhoids, external, thrombosed 08/26/2011  . BACK PAIN 02/22/2011  .  COPD (chronic obstructive pulmonary disease) ([place]) 02/06/2011  . ROTATOR CUFF TEAR 02/15/2010  . ALLERGIC RHINITIS 04/15/2008    Past Surgical History:  Procedure Laterality Date  . COLONOSCOPY    . HERNIA REPAIR     as a baby- not sure where hernia was  . KNEE SURGERY Left 1972   .  has hardware  . LUNG LOBECTOMY      RUL  . MICROLARYNGOSCOPY WITH CO2 LASER AND EXCISION OF VOCAL CORD LESION N/A 12/19/2017   Procedure: MICROLARYNGOSCOPY WITH BIOPSY;  Surgeon: Melida Quitter, MD;  Location: [facility];  Service: ENT;  Laterality: N/A;  . ROTATOR CUFF REPAIR Left   . SHOULDER SURGERY Right    rotator cuff  . THROAT SURGERY     Laser surgery for throat cancer  . TRANSTHORACIC ECHOCARDIOGRAM  01/13/2012   EF=>55%; trace MR/TR;         Home Medications    Prior to Admission medications   Medication Sig Start Date End Date Taking? Authorizing Provider  albuterol (PROVENTIL HFA;VENTOLIN HFA) 108 (90 Base) MCG/ACT inhaler Inhale 1-2 puffs into the lungs every 6 (six) hours as needed for wheezing or shortness of breath.   Yes [provider]  albuterol (PROVENTIL) (2.5 MG/3ML) 0.083% nebulizer solution Take 2.5 mg by nebulization every 6 (six) hours as needed for wheezing or shortness of breath.    Yes [provider]  aspirin EC 81 MG tablet Take 81 mg by mouth daily.   Yes [provider]  Fluticasone-Umeclidin-Vilant (TRELEGY ELLIPTA) 100-62.5-25 MCG/INH AEPB Inhale 1 puff into the lungs daily. 08/13/17  Yes Brand Males, MD  ipratropium-albuterol (DUONEB) 0.5-2.5 (3) MG/3ML SOLN Take 3 mLs by nebulization every 6 (six) hours as needed (shortness of breath). 09/21/18  Yes Brand Males, MD  levothyroxine (SYNTHROID, LEVOTHROID) 125 MCG tablet Take 62.5 mcg by mouth daily before breakfast.    Yes [provider]  mirtazapine (REMERON) 15 MG tablet Take 1 tablet (15 mg total) by mouth at bedtime. 07/11/18  Yes Florencia Reasons, MD  Multiple Vitamin (MULTIVITAMIN WITH MINERALS) TABS tablet Take 1 tablet by mouth daily. Centrum Silver   Yes [provider]  nitroGLYCERIN (NITROSTAT) 0.4 MG SL tablet Place 1 tablet (0.4 mg total) under the tongue every 5 (five) minutes as needed for chest pain. 08/09/13  Yes Delfina Redwood, MD  pravastatin (PRAVACHOL) 40 MG tablet Take  40 mg by mouth at bedtime.    Yes [provider]  temazepam (RESTORIL) 15 MG capsule Take 30 mg by mouth at bedtime.    Yes [provider]  Vitamin D, Ergocalciferol, (DRISDOL) 50000 UNITS CAPS Take 50,000 Units by mouth every 14 (fourteen) days.  05/07/11  Yes [provider]  doxycycline (VIBRAMYCIN) 100 MG capsule Take 1 capsule (100 mg total) by mouth 2 (two) times daily. 09/26/18   Khamila Bassinger, Harrell Gave, PA-C  omeprazole (PRILOSEC) 40 MG capsule Take 1 capsule (40 mg total) by mouth daily. Patient not taking: Reported on 09/26/2018 04/08/18   Ladene Artist, MD  predniSONE (DELTASONE) 50 MG tablet Take 1 tablet (50 mg total) by mouth daily. 09/26/18   Dalia Heading, PA-C    Family History Family History  Problem Relation Age of Onset  . Cancer Mother        Brain tumor  . COPD Mother   . Heart disease Father        CABG  . Prostate cancer Father   . Cystic fibrosis Sister  also heart failure  . Mental illness Brother   . Heart failure Paternal Grandmother   . Colon cancer Neg Hx   . Colon polyps Neg Hx   . Esophageal cancer Neg Hx     Social History Social History   Tobacco Use  . Smoking status: Former Smoker    Packs/day: 1.50    Years: 32.00    Pack years: 48.00    Types: Cigarettes    Last attempt to quit: 12/30/2004    Years since quitting: 13.7  . Smokeless tobacco: Never Used  . Tobacco comment: 1 ppd x 30 years  Substance Use Topics  . Alcohol use: Yes    Comment: 1- 2 beers monthly  . Drug use: No     Allergies   Codeine; Cymbalta [duloxetine hcl]; and Montelukast sodium   Review of Systems Review of Systems All other systems negative except as documented in the HPI. All pertinent positives and negatives as reviewed in the HPI.  Physical Exam Updated Vital Signs BP (!) 111/94   Pulse 98   Temp (!) 97.3 F (36.3 C) (Oral)   Resp 19   SpO2 95%   Physical Exam  Constitutional: He is oriented to person, place,  and time. He appears well-developed and well-nourished. No distress.  HENT:  Head: Normocephalic and atraumatic.  Mouth/Throat: Oropharynx is clear and moist.  Eyes: Pupils are equal, round, and reactive to light.  Neck: Normal range of motion. Neck supple.  Cardiovascular: Normal rate, regular rhythm and normal heart sounds. Exam reveals no gallop and no friction rub.  No murmur heard. Pulmonary/Chest: Effort normal and breath sounds normal. No respiratory distress. He has no wheezes.  Abdominal: Soft. Bowel sounds are normal. He exhibits no distension. There is no tenderness.  Neurological: He is alert and oriented to person, place, and time. He exhibits normal muscle tone. Coordination normal.  Skin: Skin is warm and dry. Capillary refill takes less than 2 seconds. No rash noted. No erythema.  Psychiatric: He has a normal mood and affect. His behavior is normal.  Nursing note and vitals reviewed.    ED Treatments / Results  Labs (all labs ordered are listed, but only abnormal results are displayed) Labs Reviewed  BASIC METABOLIC PANEL - Abnormal; Notable for the following components:      Result Value   Potassium 3.2 (*)    Chloride 97 (*)    Glucose, Bld 121 (*)    BUN 5 (*)    All other components within normal limits  CBC WITH DIFFERENTIAL/PLATELET - Abnormal; Notable for the following components:   WBC 11.7 (*)    Neutro Abs 9.8 (*)    All other components within normal limits    EKG EKG Interpretation  Date/Time:  Saturday September 26 2018 08:43:15 EDT Ventricular Rate:  102 PR Interval:    QRS Duration: 141 QT Interval:  370 QTC Calculation: 482 R Axis:   105 Text Interpretation:  Sinus tachycardia Atrial premature complexes Right atrial enlargement RBBB and LPFB Abnormal lateral Q waves Artifact in lead(s) I II aVR aVL no change from previous Confirmed by Charlesetta Shanks [phone]) on 09/26/2018 8:59:10 AM Also confirmed by Charlesetta Shanks [phone]), editor Philomena Doheny [phone])  on 09/26/2018 11:23:52 AM   Radiology Dg Chest 2 View  Result Date: 09/26/2018 CLINICAL DATA:  Difficulty breathing for 2 days EXAM: CHEST - 2 VIEW COMPARISON:  08/18/2018 FINDINGS: Right apical pleural and parenchymal opacity with volume loss is stable. Postoperative changes  are noted. Scarring throughout the remainder of the right lung. Left lung is hyperaerated and clear. Normal heart size. No pneumothorax. IMPRESSION: Stable postoperative changes. Electronically Signed   By: Marybelle Killings M.D.   On: 09/26/2018 09:59    Procedures Procedures (including critical care time)  Medications Ordered in ED Medications - No data to display   Initial Impression / Assessment and Plan / ED Course  I have reviewed the triage vital signs and the nursing notes.  Pertinent labs & imaging results that were available during my care of the patient were reviewed by me and considered in my medical decision making (see chart for details).     Patient states he is feeling vastly improved following the medications given by EMS.  The patient states that he feels like his breathing is normal pattern.  The patient states that he feels like he is at his baseline.  The patient was able to ambulate and maintain his oxygen saturations without significant difficulty at this time.  I advised him he will need to follow-up with his pulmonologist along with primary doctor.  I also advised him that he may need to speak with ENT again about the tracheostomy placement due to the fact that he has such a narrowed airway along with COPD.  Patient agrees the plan and all questions were answered.  Final Clinical Impressions(s) / ED Diagnoses   Final diagnoses:  COPD exacerbation [facility])    ED Discharge Orders         Ordered    predniSONE (DELTASONE) 50 MG tablet  Daily     09/26/18 1442    doxycycline (VIBRAMYCIN) 100 MG capsule  2 times daily     09/26/18 [address],  PA-C 09/27/18 1532    Charlesetta Shanks, MD 10/07/18 [phone]

## 2018-09-26 NOTE — ED Triage Notes (Signed)
Pt [facility]; [facility] with c/o difficulty breathing x 2 days; Pt has hx of throat CA and with past trach and R upper lobectomy ; Pt self-administered neb tx; pt rec'd 10mg  of Albuterol; 125mg  of Soulumedrol; 18G LFA; recent visit to PCP and Abts prescribed but didn't take them due to patient "feeling they were not necessary." 108/82; 100; 96% on RA

## 2018-09-26 NOTE — ED Notes (Addendum)
Pt. Ambulated with steady gait. Pt. o2 started at 89 and as walking continued stayed above 90

## 2018-10-28 ENCOUNTER — Other Ambulatory Visit: Payer: Self-pay | Admitting: Internal Medicine

## 2018-12-10 ENCOUNTER — Ambulatory Visit (INDEPENDENT_AMBULATORY_CARE_PROVIDER_SITE_OTHER): Payer: 59 | Admitting: Internal Medicine

## 2018-12-10 ENCOUNTER — Encounter: Payer: Self-pay | Admitting: Internal Medicine

## 2018-12-10 VITALS — BP 124/88 | HR 91 | Ht 70.0 in | Wt 164.6 lb

## 2018-12-10 DIAGNOSIS — J449 Chronic obstructive pulmonary disease, unspecified: Secondary | ICD-10-CM

## 2018-12-10 MED ORDER — METHYLPREDNISOLONE ACETATE 80 MG/ML IJ SUSP
80.0000 mg | Freq: Once | INTRAMUSCULAR | Status: AC
  Administered 2018-12-10: 80 mg via INTRAMUSCULAR

## 2018-12-10 NOTE — Progress Notes (Signed)
OV 01/27/2017  Chief Complaint  Patient presents with  . Follow-up    Breathing is overall doing well. No new co's today. He     Follow-up moderate to severe COPD with a history of recurrent exacerbations and MZ phenotype  Since his last visit he continues to be stable. There are no new exacerbations. He did see Dr. Julien Nordmann oncologist 12/09/2016. I reviewed that note and summarized the fact that he has no recurrence and a follow-up CT chest is planned for June 2018. Most recent CT chest is from June 2017 and this is reviewed below. I did not visualize this film. For his recurrent exacerbations we did discuss alpha-1 replacement but we opted to take Daliresp. However he tells me that he is mostly noncompliant with it. He is willing to retry this. He is up-to-date with his flu shot.  CT chest June 2017: IMPRESSION: 1. No significant change in treatment effects within the right hemi thorax, including right upper lobectomy and presumed radiation induced consolidation at the right apex. 2. centrilobular emphysema, without other explanation for patient's symptoms. 3. Similar right middle lobe pulmonary nodule, favoring a benign etiology. 4. Cholelithiasis.   Electronically Signed   By: Abigail Miyamoto M.D.   On: 06/04/2016 09:33   OV 07/25/2017  Chief Complaint  Patient presents with  . Follow-up    pt c/o "URI" since 03/19/17.  pt c/o sob with exertion, prod cough with green/yellow mucus, fatigue.     Moderate COPD with recurrent exacerbation history MZ phenotype in the setting of lung cancer status post lobectomy  June 2018 he did have a CT scan that showed continued remission. Apparently been discharged from oncology follow-up. However for the last few months he's having recurrent exacerbations associated with sinusitis. He feels roflumilast did not help him. He is compliant with the Spiriva and Symbicort. He is frustrated by his recurrent exacerbations. He feels it is a sinus  causing these issues. Last CT scan of the sinus was in 2008. He says that when he was in allergy shots with Dr. Donneta Romberg symptoms were better. When he came off the allergy shots to go on the [place] which actually didn't help him. He was better while he was in the trial and is interested int rying Trelegy  Noted: When he walked out of the office he saw RN Alethia Berthold and made comments, "hey married girl are you stil married?". I advised him to mind his language and maintain decorum. He replied, "Ok but then aadded "these days we cannot peek and play". This is not the first time he has made comments of this nature on this RN. In fact is for this very reason he was largely worked up by another [place] 05/27/2018  Chief Complaint  Patient presents with  . Follow-up    follow up for respiratory failure, wheezing is getting better.     Moderate/borderline severe COPD with recurrent exacerbations MZ phenotype.  In the setting of lung cancer status post lobectomy and also remote throat cancer  Last seen July 2018.  Since then there has been significant changes to his health status.  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  Apparently it was significant enough that resection here in [facility] by Dr. Redmond Baseman had to be abandoned and he had to undergo hospitalization and resection and status post tracheostomy at [facility].  He  is extremely upset with his healthcare providers including me for missing this diagnosis.  Quick review of the chart on his visits indicate that he has never expressed any recurrence of his hoarseness of voice with me.  He most recently had a lung cancer surveillance scan May 04, 2018 that shows lung cancer to be in remission.  He tells me Dr. Julien Nordmann has discharged him from follow-up.  He takes Trelegy inhaler for his COPD.  He has had multiple exacerbations and this is all documented in the  medical record.  Including admissions requiring BiPAP.  It appears he might not be on any nocturnal oxygen.  At this point in time he feels good and his COPD CAT score is 14; low scores he has had.  He is not interested in retesting for allergies.  OV 09/21/2018  Subjective:  Patient ID: Stephen [name], male , DOB: 1956/03/17 , age 50 y.o. , MRN: [id number] , ADDRESS: [place] [id number]   09/21/2018 -   Chief Complaint  Patient presents with  . Follow-up    Pt has been having more problems with SOB.   Moderate/borderline severe COPD with recurrent exacerbations MZ phenotype.  In the setting of lung cancer status post lobectomy and also recurrent throat cancer  HPI Stephen [name] 80 y.o. - returns for COPD follow-up. In the interim he has continued to lose weight. His main issue is that he has upper airway hoarseness and stridor his breathing. He says he saw ENT at [facility] for his throat cancer and his upper airway is narrowed. They've discussed tracheostomy but he is holding off.He does not have dysphagia but he is extremely careful with how he eats. He takes precautions of mixing solids and liquids and not talking while eating. So far no aspiration. August 2019 chest x-ray is clear per report. In terms of his COPD says up-to-date with his flu shot and compliant with his inhalers. He wants refill on his DuoNeb. He does not feel he is in an exacerbation.        OV 12/10/2018  Subjective:  Patient ID: Stephen [name], male , DOB: Sep 17, 1956 , age 62 y.o. , MRN: [id number] , ADDRESS: [place] [id number]   12/10/2018 -   Chief Complaint  Patient presents with  . Follow-up    SOB with exertion, cough with milky mucus,      HPI Stephen [name] 85 y.o. -presents for his severe COPD follow-up.  He continues to be hampered by hoarse voice because of his laryngeal cancer.  He saw ENT at [facility] and they have continue to  monitor him but have a low threshold for tracheostomy.  They have apparently advised him to avoid respiratory illnesses at any cost otherwise he might end up with stridor and emergency tracheostomy.  Therefore he wants a preemptive IM Depo-Medrol x1.  He categorically states he is not in exacerbation but is worried he might get one.  He stable with Trelegy.  Other history: He is complaining of myofascial spasms that he has discussed with Dr. Selena Lesser his primary care doctor.  He is going for some massage.  He asked me to look at his back and yes indeed in the right infrascapular area there is myofascial spasm knot     CAT COPD Symptom & Quality of Life Score ([place]) 0 is no burden. 5 is highest burden 2015 07/25/2017  05/27/2018  12/10/2018   Never Cough ->  Cough all the time  4 2 2   No phlegm in chest -> Chest is full of phlegm  3 0 3  No chest tightness -> Chest feels very tight  3 0 1  No dyspnea for 1 flight stairs/hill -> Very dyspneic for 1 flight of stairs  4 2 2   No limitations for ADL at home -> Very limited with ADL at home  5 5 5   Confident leaving home -> Not at all confident leaving home  0 1 0  Sleep soundly -> Do not sleep soundly because of lung condition  3 0 3  Lots of Energy -> No energy at all  4 4 3   TOTAL Score (max 40)  26 26 14 19     ROS - per HPI     has a past medical history of Allergic rhinitis, cause unspecified, Anxiety, Blindness of left eye, CAD (coronary artery disease), Complication of anesthesia, Depression, Dyspnea, GERD (gastroesophageal reflux disease), Hypothyroidism, Lung cancer ([place]), Osteoporosis, Other emphysema ([place]), Throat cancer ([place]), Thrombosed hemorrhoids, Tubular adenoma of colon (2009), and Unspecified vitamin D deficiency.   reports that he quit smoking about 13 years ago. His smoking use included cigarettes. He has a 48.00 pack-year smoking history. He has never used smokeless tobacco.  Past Surgical History:  Procedure  Laterality Date  . COLONOSCOPY    . HERNIA REPAIR     as a baby- not sure where hernia was  . KNEE SURGERY Left 1972   .  has hardware  . LUNG LOBECTOMY     RUL  . MICROLARYNGOSCOPY WITH CO2 LASER AND EXCISION OF VOCAL CORD LESION N/A 12/19/2017   Procedure: MICROLARYNGOSCOPY WITH BIOPSY;  Surgeon: Melida Quitter, MD;  Location: [facility];  Service: ENT;  Laterality: N/A;  . ROTATOR CUFF REPAIR Left   . SHOULDER SURGERY Right    rotator cuff  . THROAT SURGERY     Laser surgery for throat cancer  . TRANSTHORACIC ECHOCARDIOGRAM  01/13/2012   EF=>55%; trace MR/TR;     Allergies  Allergen Reactions  . Codeine Other (See Comments)    Unknown reaction. Mother told him he was allergic  . Cymbalta [Duloxetine Hcl] Nausea And Vomiting  . Montelukast Sodium Other (See Comments)    Causes sinusitis    Immunization History  Administered Date(s) Administered  . Influenza Split 08/30/2013, 09/08/2014, 09/06/2015  . Influenza Whole 10/01/2010, 10/02/2011, 09/29/2016  . Influenza,inj,Quad PF,6+ Mos 09/15/2018  . Pneumococcal Conjugate-13 09/08/2014  . Pneumococcal Polysaccharide-23 10/30/2009  . Zoster 09/09/2016    Family History  Problem Relation Age of Onset  . Cancer Mother        Brain tumor  . COPD Mother   . Heart disease Father        CABG  . Prostate cancer Father   . Cystic fibrosis Sister        also heart failure  . Mental illness Brother   . Heart failure Paternal Grandmother   . Colon cancer Neg Hx   . Colon polyps Neg Hx   . Esophageal cancer Neg Hx      Current Outpatient Medications:  .  albuterol (PROVENTIL HFA;VENTOLIN HFA) 108 (90 Base) MCG/ACT inhaler, Inhale 1-2 puffs into the lungs every 6 (six) hours as needed for wheezing or shortness of breath., Disp: , Rfl:  .  albuterol (PROVENTIL) (2.5 MG/3ML) 0.083% nebulizer solution, Take 2.5 mg by nebulization every 6 (six) hours as needed for wheezing or shortness of breath. , Disp: ,  Rfl:  .  aspirin EC 81 MG  tablet, Take 81 mg by mouth daily., Disp: , Rfl:  .  ipratropium-albuterol (DUONEB) 0.5-2.5 (3) MG/3ML SOLN, Take 3 mLs by nebulization every 6 (six) hours as needed (shortness of breath)., Disp: 120 mL, Rfl: 11 .  levothyroxine (SYNTHROID, LEVOTHROID) 125 MCG tablet, Take 62.5 mcg by mouth daily before breakfast. , Disp: , Rfl:  .  mirtazapine (REMERON) 15 MG tablet, Take 1 tablet (15 mg total) by mouth at bedtime., Disp: 30 tablet, Rfl: 0 .  Multiple Vitamin (MULTIVITAMIN WITH MINERALS) TABS tablet, Take 1 tablet by mouth daily. Centrum Silver, Disp: , Rfl:  .  nitroGLYCERIN (NITROSTAT) 0.4 MG SL tablet, Place 1 tablet (0.4 mg total) under the tongue every 5 (five) minutes as needed for chest pain., Disp: 20 tablet, Rfl: 0 .  omeprazole (PRILOSEC) 40 MG capsule, Take 1 capsule (40 mg total) by mouth daily., Disp: 30 capsule, Rfl: 11 .  pravastatin (PRAVACHOL) 40 MG tablet, Take 40 mg by mouth at bedtime. , Disp: , Rfl:  .  temazepam (RESTORIL) 15 MG capsule, Take 30 mg by mouth at bedtime. , Disp: , Rfl:  .  TRELEGY ELLIPTA 100-62.5-25 MCG/INH AEPB, INHALE 1 PUFF INTO THE LUNGS DAILY, Disp: 1 each, Rfl: 3 .  Vitamin D, Ergocalciferol, (DRISDOL) 50000 UNITS CAPS, Take 50,000 Units by mouth every 14 (fourteen) days. , Disp: , Rfl:   Current Facility-Administered Medications:  .  methylPREDNISolone acetate (DEPO-MEDROL) injection 80 mg, 80 mg, Intramuscular, Once, Brand Males, MD      Objective:   Vitals:   12/10/18 1433  BP: 124/88  Pulse: 91  SpO2: 95%  Weight: 164 lb 9.6 oz (74.7 kg)  Height: 5\' 10"  (1.778 m)    Estimated body mass index is 23.62 kg/m as calculated from the following:   Height as of this encounter: 5\' 10"  (1.778 m).   Weight as of this encounter: 164 lb 9.6 oz (74.7 kg).  @WEIGHTCHANGE @  [employer]   12/10/18 1433  Weight: 164 lb 9.6 oz (74.7 kg)     Physical Exam  General Appearance:    Alert, cooperative, no distress, appears stated age - older  , Deconditioned looking - yes, thin , OBESE  - no, Sitting on Wheelchair -  no  Head:    Normocephalic, without obvious abnormality, atraumatic  Eyes:    PERRL, conjunctiva/corneas clear,  Ears:    Normal TM's and external ear canals, both ears  Nose:   Nares normal, septum midline, mucosa normal, no drainage    or sinus tenderness. OXYGEN ON  - no . Patient is @ RA   Throat:   Lips, mucosa, and tongue normal; teeth and gums normal. Cyanosis on lips - NO. HAS HOARSE VOICE +  Neck:   Supple, symmetrical, trachea midline, no adenopathy;    thyroid:  no enlargement/tenderness/nodules; no carotid   bruit or JVD  Back:     Symmetric, no curvature, ROM normal, no CVA tenderness  Lungs:     Distress - no , Wheeze no, Barrell Chest - no, Purse lip breathing - no, Crackles - no   Chest Wall:    No tenderness or deformity.    Heart:    Regular rate and rhythm, S1 and S2 normal, no rub   or gallop, Murmur - no  Breast Exam:    NOT DONE  Abdomen:     Soft, non-tender, bowel sounds active all four quadrants,    no masses,  no organomegaly. Visceral obesity - no  Genitalia:   NOT DONE  Rectal:   NOT DONE  Extremities:   Extremities - normal, Has Cane - no, Clubbing - no, Edema - no. MYOSFASCIAL SPASM in back  Pulses:   2+ and symmetric all extremities  Skin:   Stigmata of Connective Tissue Disease - no  Lymph nodes:   Cervical, supraclavicular, and axillary nodes normal  Psychiatric:  Neurologic:   Pleasant - UES, Anxious - no, Flat affect - no  CAm-ICU - neg, Alert and Oriented x 3 - yes, Moves all 4s - yes, Speech - normal, Cognition - intact           Assessment:       ICD-10-CM   1. Stage 3 severe COPD by GOLD classification (HCC) J44.9 methylPREDNISolone acetate (DEPO-MEDROL) injection 80 mg       Plan:     Patient Instructions     ICD-10-CM   1. Stage 3 severe COPD by GOLD classification ([place]) J44.9    Stable disease but can try to pre-empt copd flare up with  IM depotmedrol  80mg  x 1 in the office 12/10/2018. So, my cMA will give it  Plan -  duoneb as needed - continue trelegy and other inhalers  - continue tracheostomy discussions with ENT doc - I support you having it if you chose that route  Followup 3-6 months or sooner if needed; CAT score at followup     SIGNATURE    Dr. Brand Males, M.D., F.C.C.P,  Pulmonary and Critical Care Medicine Staff Physician, [facility] Director - Interstitial Lung Disease  Program  P[facility] [place] at [place], [place], [address]  Pager: [phone], If no answer or between  15:00h - 7:00h: call [phone] Telephone: [phone]  2:57 PM 12/10/2018

## 2018-12-10 NOTE — Patient Instructions (Signed)
ICD-10-CM   1. Stage 3 severe COPD by GOLD classification ([place]) J44.9    Stable disease but can try to pre-empt copd flare up with  IM depotmedrol 80mg  x 1 in the office 12/10/2018. So, my cMA will give it  Plan -  duoneb as needed - continue trelegy and other inhalers  - continue tracheostomy discussions with ENT doc - I support you having it if you chose that route  Followup 3-6 months or sooner if needed; CAT score at followup

## 2019-06-15 ENCOUNTER — Telehealth: Payer: Self-pay | Admitting: Nurse Practitioner

## 2019-06-15 NOTE — Telephone Encounter (Signed)
Talked with patient and have scheduled a Telephone Palliative consult for 06/28/19 @ 3 PM.

## 2019-06-28 ENCOUNTER — Other Ambulatory Visit: Payer: Self-pay

## 2019-06-28 ENCOUNTER — Encounter: Payer: Self-pay | Admitting: Nurse Practitioner

## 2019-06-28 ENCOUNTER — Other Ambulatory Visit: Payer: 59 | Admitting: Nurse Practitioner

## 2019-06-28 DIAGNOSIS — R0602 Shortness of breath: Secondary | ICD-10-CM

## 2019-06-28 DIAGNOSIS — Z515 Encounter for palliative care: Secondary | ICD-10-CM | POA: Insufficient documentation

## 2019-06-28 NOTE — Progress Notes (Signed)
[facility] Consult Note Telephone: [phone]  Fax: [phone]  PATIENT NAME: Stephen [name] DOB: May 06, 1956 MRN: [id number]  PRIMARY CARE PROVIDER:   [name], MD  REFERRING PROVIDER:  [name], MD [place],   [id number]  RESPONSIBLE PARTY:   Self  Due to the COVID-19 crisis, this visit was done via telemedicine from my office and it was initiated and consent by this patient and or family.  I was asked by Dr Joylene Draft to complete a PC consult for goc  RECOMMENDATIONS and PLAN:  1. Palliative care encounter Z51.5; Palliative medicine team will continue to support patient, patient's family, and medical team. Visit consisted of counseling and education dealing with the complex and emotionally intense issues of symptom management and palliative care in the setting of serious and potentially life-threatening illness  2. Dyspneic R06.00 secondary to remain stable at present time. Continue O2, energy conservation, inhalation therapy for comfort  ASSESSMENT:     I called [name] for palliative care initial console. We talked about purpose for palliative is it in [name] in agreement. We talked about how he was feeling today and he replies that he's doing okay. We talked at length about past medical history with progression of cancers. We talked about symptoms. We talked about his appetite which is good. We talked about symptoms of aspiration. He talked at [facility] length about his experiences with cancer, treatments and providers that he has come in contact with. We talked his experience with his father's Health, being primary decision-maker and having him reside in a LTC facility then sent to hospice home. He talked about Hospice Services at length. He shared that he had good and more challenging experiences. We talked about Life review. [name] is single, has never been married. He does not have any  children. He does have some nieces and nephews that he does not stay in contact with. He does share that he has a girlfriend. He works for [employer] until he retired. We talked about medical goals of care at length. We talked about aggressive versus conservative versus comfort care. We talked about different treatment options that he has been offered and ones that he has endured. We talked about a tracheotomy for which he shared he doesn't want one he's had one in the past. When asked him directly if it came to wear that needed to be an option he endorses that he would have a trach placed. Also staying with a feeding tube. We talked at length about a feeding tube, purpose. He shared also that if he had no other way to eat then he would not want to progressed to end-of-life he would want to have a feeding tube placed. He is a full code and wishes to remain so. He wishes to continue aggressive interventions at this time although there is no further treatment for his cancer being offered. We talked about his quality of life. He shared that his quality of life has changed with worsening COPD and overall decline. He talked about his concerns for not being able to do the things he used to do, becoming more fatigued and short of breath. He shared that he does uses oxygen in that was actually recheck today. He talked about different check in points when he feels like he has become more short of breath. He does call EMS at times to help with his breathing but has not in several  months. We talked about quality of life and what that means to him. We talked about his wishes for his care to remain aggressive at present time. He talks about being angry about his diagnosis in declining overall health. We talked about coping strategies. We talked about role of palliative care and plan of care. We talked about follow-up palliative care visit in 4 weeks and scheduled. [name] in agreement. Questions answered  satisfaction, contact information provided.  12/18/2019 weight 164.9 lbs 3 / 11 / 2020 weight 177.0 lbs   I spent 60 minutes providing this consultation,  from 3pm to 4pm. More than 50% of the time in this consultation was spent coordinating communication.   HISTORY OF PRESENT ILLNESS:  [name] is a 63 y.o. 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  See my pulmonology 12 / 12 / 2019 Dr. Chase Caller. Gold classification stage 3 severe COPD. 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 Redmond Baseman. He did well until fall 2018 began having hoarseness with SCC on left TVC. Underwent open vertical partial laryngectomy on 2/4 /2019. Also finding RT to the chest, lower neck for esophageal cancer. Per documentation he express continued dyspnea and stridor though had been stable. Voice continues to be hoarse. It was discussed that a possible tracheotomy could help to improve breathing in some quality of life although he was not ready to commit at that time. Plan follow up for months. [name] resides at home by himself, independent. Palliative Care was asked to help address goals of care.   CODE STATUS: Full code  PPS: 60% HOSPICE ELIGIBILITY/DIAGNOSIS: TBD  PAST MEDICAL HISTORY:  Past Medical History:  Diagnosis Date   Allergic rhinitis, cause unspecified    Anxiety    Blindness of left eye    CAD (coronary artery disease)    Coronary CTA 7/19: Calcium score 0, LAD with proximal discrete area of soft  plaque;  Mid LAD FFR 0.86 - No hemodynamically significant coronary stenoses by CT FFR.   Complication of anesthesia    difficulty awaking after colonoscopy    Depression    Dyspnea    GERD (gastroesophageal reflux disease)    Hypothyroidism    Lung cancer ([place])    lung ca dx 06, has had chemo and radiation   Osteoporosis    Other emphysema ([place])    copd   Throat cancer ([place])    throat ca dx 2007   Thrombosed hemorrhoids    Tubular adenoma of colon 2009   Unspecified vitamin D deficiency     SOCIAL HX:  Social History   Tobacco Use   Smoking status: Former Smoker    Packs/day: 1.50    Years: 32.00    Pack years: 48.00    Types: Cigarettes    Quit date: 12/30/2004    Years since quitting: 14.5   Smokeless tobacco: Never Used   Tobacco comment: 1 ppd x 30 years  Substance Use Topics   Alcohol use: Yes    Comment: 1- 2 beers monthly  ALLERGIES:  Allergies  Allergen Reactions   Codeine Other (See Comments)    Unknown reaction. Mother told him he was allergic   Cymbalta [Duloxetine Hcl] Nausea And Vomiting   Montelukast Sodium Other (See Comments)    Causes sinusitis     PERTINENT MEDICATIONS:  Outpatient Encounter Medications as of 06/28/2019  Medication Sig   albuterol (PROVENTIL HFA;VENTOLIN HFA) 108 (90 Base) MCG/ACT inhaler Inhale 1-2 puffs into the lungs every 6 (six) hours as needed for wheezing or shortness of breath.   albuterol (PROVENTIL) (2.5 MG/3ML) 0.083% nebulizer solution Take 2.5 mg by nebulization every 6 (six) hours as needed for wheezing or shortness of breath.    aspirin EC 81 MG tablet Take 81 mg by mouth daily.   ipratropium-albuterol (DUONEB) 0.5-2.5 (3) MG/3ML SOLN Take 3 mLs by nebulization every 6 (six) hours as needed (shortness of breath).   levothyroxine (SYNTHROID, LEVOTHROID) 125 MCG tablet Take 62.5 mcg by mouth daily before breakfast.    mirtazapine (REMERON) 15 MG tablet Take 1 tablet (15 mg total) by mouth at  bedtime.   Multiple Vitamin (MULTIVITAMIN WITH MINERALS) TABS tablet Take 1 tablet by mouth daily. Centrum Silver   nitroGLYCERIN (NITROSTAT) 0.4 MG SL tablet Place 1 tablet (0.4 mg total) under the tongue every 5 (five) minutes as needed for chest pain.   omeprazole (PRILOSEC) 40 MG capsule Take 1 capsule (40 mg total) by mouth daily.   pravastatin (PRAVACHOL) 40 MG tablet Take 40 mg by mouth at bedtime.    temazepam (RESTORIL) 15 MG capsule Take 30 mg by mouth at bedtime.    TRELEGY ELLIPTA 100-62.5-25 MCG/INH AEPB INHALE 1 PUFF INTO THE LUNGS DAILY   Vitamin D, Ergocalciferol, (DRISDOL) 50000 UNITS CAPS Take 50,000 Units by mouth every 14 (fourteen) days.    No facility-administered encounter medications on file as of 06/28/2019.     PHYSICAL EXAM:   Deferred  Rony Ratz Z Yadier Bramhall, NP

## 2019-07-27 ENCOUNTER — Encounter: Payer: Self-pay | Admitting: Nurse Practitioner

## 2019-07-27 ENCOUNTER — Other Ambulatory Visit: Payer: 59 | Admitting: Nurse Practitioner

## 2019-07-27 ENCOUNTER — Other Ambulatory Visit: Payer: Self-pay

## 2019-07-27 DIAGNOSIS — R0602 Shortness of breath: Secondary | ICD-10-CM | POA: Insufficient documentation

## 2019-07-27 DIAGNOSIS — Z515 Encounter for palliative care: Secondary | ICD-10-CM

## 2019-07-27 NOTE — Progress Notes (Signed)
[facility] Consult Note Telephone: [phone]  Fax: [phone]  PATIENT NAME: Stephen [name] DOB: December 20, 1956 MRN: [id number]  PRIMARY CARE PROVIDER:   [name], MD  REFERRING PROVIDER:  [name], MD [address] [place],  [place] [address]  RESPONSIBLE PARTY:     Self Cliffard Hair [phone]  Due to the COVID-19 crisis, this visit was done via telemedicine from my office and it was initiated and consent by this patient and or family.  I was asked by Dr Joylene Draft to complete a PC consult for goc  RECOMMENDATIONS and PLAN:  1. Palliative care encounter Z51.5; Palliative medicine team will continue to support patient, patient's family, and medical team. Visit consisted of counseling and education dealing with the complex and emotionally intense issues of symptom management and palliative care in the setting of serious and potentially life-threatening illness  2. Dyspneic R06.00 secondary to remain stable at present time. Continue O2, energy conservation, inhalation therapy for comfort  ASSESSMENT:     I called Mr. Adames for schedule palliative care follow-up visit. We talked about purpose of visit in Mr. Reagle in agreement. We talked about how he's feeling. He sure that he's doing better. He had a recent appointment at [facility] and has now been released back to his ENT. He was very thankful for that. He denies symptoms of shortness of breath or pain. He showed that his appetite is good. Mr. Sandiford indoor says that he's been trying really hard to continue to stay inside as it is very hot outside. He is trying to keep his exposure to other people limited and stay at home as much as possible. He does continue to live independently and care for himself. Medical goals of care remain with focus on aggressive intervention, full code. We talked about role of palliative care and plan of care. Mr. Doane endorses he's doing  well and would like to put palliative care off and schedule next appointment in 3 months if needed or sooner should he declined. Therapeutic listening and emotional support provided. Palliative care follow-up appointment scheduled. Contact information provided. Questions answered to satisfaction.  12/18/2019 weight 164.9 lbs 3 / 11 / 2020 weight 177.0 lbs  No recent weight reported   I spent 30 minutes providing this consultation,  from 2:00pm to 2:30pm More than 50% of the time in this consultation was spent coordinating communication.   HISTORY OF PRESENT ILLNESS:  Stephen [name] is a 63 y.o. 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  See my pulmonology 12 / 12 / 2019 Dr. Chase Caller. Gold classification stage 3 severe COPD. 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 Redmond Baseman. He did well until fall 2018 began having hoarseness with SCC on left TVC. Underwent open vertical partial laryngectomy on 2/4 /2019. Also finding RT to the chest, lower neck for esophageal cancer. Per documentation he express continued dyspnea and stridor though had been stable. Voice continues to be hoarse. He lives independently at home by himself. Palliative Care was asked to help to  continue to address goals of care.   CODE STATUS: Full code  PPS: 60% HOSPICE ELIGIBILITY/DIAGNOSIS: TBD  PAST MEDICAL HISTORY:  Past Medical History:  Diagnosis Date   Allergic rhinitis, cause unspecified    Anxiety    Blindness of left eye    CAD  (coronary artery disease)    Coronary CTA 7/19: Calcium score 0, LAD with proximal discrete area of soft plaque;  Mid LAD FFR 0.86 - No hemodynamically significant coronary stenoses by CT FFR.   Complication of anesthesia    difficulty awaking after colonoscopy    Depression    Dyspnea    GERD (gastroesophageal reflux disease)    Hypothyroidism    Lung cancer ([place])    lung ca dx 06, has had chemo and radiation   Osteoporosis    Other emphysema ([place])    copd   Throat cancer ([place])    throat ca dx 2007   Thrombosed hemorrhoids    Tubular adenoma of colon 2009   Unspecified vitamin D deficiency     SOCIAL HX:  Social History   Tobacco Use   Smoking status: Former Smoker    Packs/day: 1.50    Years: 32.00    Pack years: 48.00    Types: Cigarettes    Quit date: 12/30/2004    Years since quitting: 14.5   Smokeless tobacco: Never Used   Tobacco comment: 1 ppd x 30 years  Substance Use Topics   Alcohol use: Yes    Comment: 1- 2 beers monthly    ALLERGIES:  Allergies  Allergen Reactions   Codeine Other (See Comments)    Unknown reaction. Mother told him he was allergic   Cymbalta [Duloxetine Hcl] Nausea And Vomiting   Montelukast Sodium Other (See Comments)    Causes sinusitis     PERTINENT MEDICATIONS:  Outpatient Encounter Medications as of 07/27/2019  Medication Sig   albuterol (PROVENTIL HFA;VENTOLIN HFA) 108 (90 Base) MCG/ACT inhaler Inhale 1-2 puffs into the lungs every 6 (six) hours as needed for wheezing or shortness of breath.   albuterol (PROVENTIL) (2.5 MG/3ML) 0.083% nebulizer solution Take 2.5 mg by nebulization every 6 (six) hours as needed for wheezing or shortness of breath.    aspirin EC 81 MG tablet Take 81 mg by mouth daily.   ipratropium-albuterol (DUONEB) 0.5-2.5 (3) MG/3ML SOLN Take 3 mLs by nebulization every 6 (six) hours as needed (shortness of breath).   levothyroxine (SYNTHROID, LEVOTHROID) 125 MCG tablet Take 62.5 mcg by  mouth daily before breakfast.    mirtazapine (REMERON) 15 MG tablet Take 1 tablet (15 mg total) by mouth at bedtime.   Multiple Vitamin (MULTIVITAMIN WITH MINERALS) TABS tablet Take 1 tablet by mouth daily. Centrum Silver   nitroGLYCERIN (NITROSTAT) 0.4 MG SL tablet Place 1 tablet (0.4 mg total) under the tongue every 5 (five) minutes as needed for chest pain.   omeprazole (PRILOSEC) 40 MG capsule Take 1 capsule (40 mg total) by mouth daily.   pravastatin (PRAVACHOL) 40 MG tablet Take 40 mg by mouth at bedtime.    temazepam (RESTORIL) 15 MG capsule Take 30 mg by mouth at bedtime.    TRELEGY ELLIPTA 100-62.5-25 MCG/INH AEPB INHALE 1 PUFF INTO THE LUNGS DAILY   Vitamin D, Ergocalciferol, (DRISDOL) 50000 UNITS CAPS Take 50,000 Units by mouth every 14 (fourteen) days.    No facility-administered encounter medications on file as of 07/27/2019.     PHYSICAL EXAM:   Deferred  Adelina Collard Z Najee Cowens, NP

## 2019-09-23 ENCOUNTER — Other Ambulatory Visit: Payer: 59 | Admitting: Nurse Practitioner

## 2019-09-23 ENCOUNTER — Other Ambulatory Visit: Payer: Self-pay

## 2019-09-24 ENCOUNTER — Other Ambulatory Visit: Payer: 59 | Admitting: Nurse Practitioner

## 2019-09-24 ENCOUNTER — Other Ambulatory Visit: Payer: Self-pay

## 2019-09-24 ENCOUNTER — Encounter: Payer: Self-pay | Admitting: Nurse Practitioner

## 2019-09-24 DIAGNOSIS — Z515 Encounter for palliative care: Secondary | ICD-10-CM

## 2019-09-24 NOTE — Progress Notes (Signed)
[facility] Consult Note Telephone: [phone]  Fax: [phone]  PATIENT NAME: Stephen [name] DOB: 06-Apr-1956 MRN: [id number]  PRIMARY CARE PROVIDER:   [name], MD  REFERRING PROVIDER:  [name], MD [address] [place],  [place] [address]  RESPONSIBLE PARTY:   Self Bralon Antkowiak [phone]  Due to the COVID-19 crisis, this visit was done via telemedicine from my office and it was initiated and consent by this patient and or family.  I was asked by Dr Joylene Draft to complete a PC consult for goc  RECOMMENDATIONS and PLAN: 1.ACP: Full code with aggressive interventions  2.Dyspneic secondary to remain stable at present time.Continue O2, energy conservation, inhalation therapy for comfort; may consider Roxanol for comfort, will f/u with Dr Joylene Draft for his thoughts about Roxanol and Mr. Cuthrell  3. Palliative care encounter; Palliative medicine team will continue to support patient, patient's family, and medical team. Visit consisted of counseling and education dealing with the complex and emotionally intense issues of symptom management and palliative care in the setting of serious and potentially life-threatening illness  I spent 63 minutes providing this consultation,  from 11:00am to 12:05pm. More than 50% of the time in this consultation was spent coordinating communication.   HISTORY OF PRESENT ILLNESS:  [name] is a 63 y.o. 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 I called  [name] for scheduled telemedicine telephonic follow-up palliative care visit. Mr Carithers agreement. We talked about how he's feeling. He replies that he's not been feeling very well overall. He's been having more struggles with COPD exacerbations, fearful of not being able to breathe. We talked about chronic disease progression. [name] talked at length about his history and experiences of everything he went through with his cancer, diagnosis has and reoccurrence of cancers with his treatments. He talked about his frustration of reoccurrence with several forms of cancer. He talked about frustration with medical staff. We talked about coping strategies. We talked about where he is currently in chronic disease progression of COPD. He does where is oxygen when needed. He does to his nebulizers when needed. He does struggle walking to the mailbox which causes him concerned he becomes fearful, anxious and then becomes more difficult for him to breathe. We talked about his fear of being at home by himself and not getting help in time and passing from disease progression. He is afraid of not being able to catch his breath. We talked about option of roxanol. Asked if it was okay to this further discuss with Dr Joylene Draft to see what his thoughts are on that option. [name] talked at length about a tracheostomy. He shared that that is an option on the table for him. We talked about pros and cons. We talked about quality of life versus quantity of days. [name] talked about his experience in the past when he did have a trach although now that is closed up.  We talked about medical goals of care including aggressive versus conservative versus Comfort Care. We talked about Mr Melander in agreement. We talked about his appetite which has improved. He talked  about social isolation at length in challenges with covet pandemic. We talked about social worker for palliative care and [name] was open for her to  follow up for console to include supportive measures. We talked about role of palliative care and plan of care. Discuss that will re-contact [name] for further discussion about roxanol after getting doctor for any thoughts for recommendation. In addition referral made for palliative care social worker. We'll schedule follow-up visit for palliative care during phone call when call [name] for update on Dr Joylene Draft thoughts. [name] in agreement. Therapeutic listening and emotional support provided. Contact information provided. Questions answered to satisfaction.  Palliative Care was asked to help to continue to address goals of care.   CODE STATUS: Full code  PPS: 60% HOSPICE ELIGIBILITY/DIAGNOSIS: TBD  PAST MEDICAL HISTORY:  Past Medical History:  Diagnosis Date   Allergic rhinitis, cause unspecified    Anxiety    Blindness of left eye    CAD (coronary artery disease)    Coronary CTA 7/19: Calcium score 0, LAD with proximal discrete area of soft plaque;  Mid LAD FFR 0.86 - No hemodynamically significant coronary stenoses by CT FFR.   Complication of anesthesia    difficulty awaking after colonoscopy    Depression    Dyspnea    GERD (gastroesophageal reflux disease)    Hypothyroidism    Lung cancer ([place])    lung ca dx 06, has had chemo and radiation   Osteoporosis    Other emphysema ([place])    copd   Throat cancer ([place])    throat ca dx 2007   Thrombosed hemorrhoids    Tubular adenoma of colon 2009   Unspecified vitamin D deficiency     SOCIAL HX:  Social History   Tobacco Use   Smoking status: Former Smoker    Packs/day: 1.50    Years: 32.00    Pack years: 48.00    Types: Cigarettes    Quit date: 12/30/2004    Years since quitting: 14.7   Smokeless tobacco: Never Used   Tobacco comment: 1 ppd x 30 years  Substance Use Topics   Alcohol use: Yes    Comment: 1- 2 beers monthly    ALLERGIES:  Allergies  Allergen Reactions   Codeine  Other (See Comments)    Unknown reaction. Mother told him he was allergic   Cymbalta [Duloxetine Hcl] Nausea And Vomiting   Montelukast Sodium Other (See Comments)    Causes sinusitis     PERTINENT MEDICATIONS:  Outpatient Encounter Medications as of 09/24/2019  Medication Sig   albuterol (PROVENTIL HFA;VENTOLIN HFA) 108 (90 Base) MCG/ACT inhaler Inhale 1-2 puffs into the lungs every 6 (six) hours as needed for wheezing or shortness of breath.   albuterol (PROVENTIL) (2.5 MG/3ML) 0.083% nebulizer solution Take 2.5 mg by nebulization every 6 (six) hours as needed for wheezing or shortness of breath.    aspirin EC 81 MG tablet Take 81 mg by mouth daily.   ipratropium-albuterol (DUONEB) 0.5-2.5 (3) MG/3ML SOLN Take 3 mLs by nebulization every 6 (six) hours as needed (shortness of breath).   levothyroxine (SYNTHROID, LEVOTHROID) 125 MCG tablet Take 62.5 mcg by mouth daily before breakfast.    mirtazapine (REMERON) 15 MG tablet Take 1 tablet (15 mg total) by mouth at bedtime.   Multiple Vitamin (MULTIVITAMIN WITH MINERALS) TABS tablet Take 1 tablet by mouth daily. Centrum Silver   nitroGLYCERIN (NITROSTAT) 0.4 MG SL tablet Place 1 tablet (0.4 mg total) under  the tongue every 5 (five) minutes as needed for chest pain.   omeprazole (PRILOSEC) 40 MG capsule Take 1 capsule (40 mg total) by mouth daily.   pravastatin (PRAVACHOL) 40 MG tablet Take 40 mg by mouth at bedtime.    temazepam (RESTORIL) 15 MG capsule Take 30 mg by mouth at bedtime.    TRELEGY ELLIPTA 100-62.5-25 MCG/INH AEPB INHALE 1 PUFF INTO THE LUNGS DAILY   Vitamin D, Ergocalciferol, (DRISDOL) 50000 UNITS CAPS Take 50,000 Units by mouth every 14 (fourteen) days.    No facility-administered encounter medications on file as of 09/24/2019.     PHYSICAL EXAM:   Deferred Danean Marner Z Cheila Wickstrom, NP

## 2019-09-29 ENCOUNTER — Telehealth: Payer: Self-pay

## 2019-09-29 NOTE — Telephone Encounter (Signed)
SW received referral from [place], NP to follow-up. Patient provided brief medical and social history. Patient lives alone. Patient said he has two brothers [place], but not supportive. Patient said that he has local friends that help with errands. Patient was alert, oriented. Patient was open about his feelings. Patient shared his experience with his anxiety and SOB. Patient said he has daily challenges with doing household tasks due to SOB. Patient said his biggest fear is having to go to a nursing facility. Discussed care options, and planning for long-term care. Patient tries to focus on positive but said he does feel down at times due to situation. Patient watches television most of the day. Discussed other coping skills - phone calls with friends, reading, puzzles, crosswords, games, etc. Patient is also going to try and keep a routine each day. SW provided supportive counseling and emotional support, discussed coping skills and used active and reflective listening.

## 2019-10-05 ENCOUNTER — Telehealth: Payer: Self-pay

## 2019-10-05 ENCOUNTER — Telehealth: Payer: Self-pay | Admitting: Nurse Practitioner

## 2019-10-05 NOTE — Telephone Encounter (Signed)
I called Stephen [name] concerning follow up discussion concerning roxanol. Dr. Haynes Kerns in agreement with starting roxanol. Stephen Stephen [name] endorses that the prescriptions at the pharmacy and he was going to go today to pick it up. We talked about chronic disease progression of COPD. Stephen Stephen [name] had many questions concerning staging and progression of disease. We talked about roxanol, pharmacokinetics and how and when to use. We talked about his girlfriend's concern of being used at end of life with her grandfather. We talked about realistic expectations with progression of disease. Stephen Stephen [name] talked about COPD, symptoms of dyspnea, anxiety. We talked realistic expectations with relief with roxanol. He talked about having oxygen at home but does not have a portable tank to be able to take with him. Stephen Stephen [name] talked about having to use more oxygen on a consistent basis. He talked about trying to get a portable tank. We talked about pre-qualifying and office visit with Doctor pre-need. Discuss will follow up and see if portable oxygen can be put in places now he is requiring consistently throughout Stephen [name] and night including nebulizers when he becomes very short of breath which is often. He talked about being able to walk from the bedroom to the living room and becoming short of breath. He talked about walking half way up the driveway and becoming very short of breath. We talked about energy conservation, managing symptoms. Discuss will help with that refer need to figure out if further testing needs to be done to access a portable oxygen. We talked about role of palliative care and plan of care. We talked about next scheduled visit next week for follow up concerning initiation of roxanol. Stephen. [name] in agreement. Questions answered to satisfaction. Contact information. Therapeutic listening and emotional support provided.

## 2019-10-05 NOTE — Telephone Encounter (Signed)
At the direction of Christin NP, phone call placed to PCP office to request a prescription for portable oxygen be sent to DME provider. VM left with call back information.

## 2019-10-06 ENCOUNTER — Telehealth: Payer: Self-pay

## 2019-10-06 NOTE — Telephone Encounter (Signed)
Received a return call from [place] with Dr. Silvestre Mesi office regarding order for portable oxygen. Plan is to schedule visit with patient to obtain oxygen levels to verify if patient qualifies for portable oxygen. Palliative NP updated

## 2019-10-06 NOTE — Telephone Encounter (Signed)
Received message that pharmacy was asking for hospice billing information. [facility] pharmacy called. Spoke with Gwenette Greet to make her aware that patient is not under hospice services but under palliative care. Billing should not change

## 2019-10-11 ENCOUNTER — Other Ambulatory Visit: Payer: 59

## 2019-10-12 ENCOUNTER — Telehealth: Payer: Self-pay

## 2019-10-12 NOTE — Progress Notes (Signed)
Visit made to obtain oxygen saturation readings at request of Christin NP for portable oxygen.  O2 sat at rest on room air: 95%  O2 sat while ambulating on room air: 86%  O2 sat on 2 liters recovery: 96%  Respirations became dyspneic with ambulation. Patient expressed feeling anxious and feeling afraid that he will "suffocate" and not be able to catch his breath.

## 2019-10-13 NOTE — Telephone Encounter (Addendum)
SW contacted patient to check-in. Patient is waiting on portable oxygen order, discussed visit with Arley Phenix RN. Patient said he feels "a little better" overall. Patient's main concern is dyspnea and anxiety. Patient is now using the Roxanol. Discussed coping skills, and support from girlfriend. Patient confirmed that he has contact information for palliative care team. Denies any other questions/concerns at this time.

## 2019-10-14 ENCOUNTER — Telehealth: Payer: Self-pay

## 2019-10-14 ENCOUNTER — Other Ambulatory Visit: Payer: 59 | Admitting: Nurse Practitioner

## 2019-10-14 ENCOUNTER — Other Ambulatory Visit: Payer: Self-pay

## 2019-10-14 ENCOUNTER — Encounter: Payer: Self-pay | Admitting: Nurse Practitioner

## 2019-10-14 DIAGNOSIS — Z515 Encounter for palliative care: Secondary | ICD-10-CM

## 2019-10-14 NOTE — Telephone Encounter (Signed)
Message left with Morey Hummingbird, medical assistant to Dr. Joylene Draft, to follow up about order for portable oxygen.

## 2019-10-14 NOTE — Progress Notes (Signed)
[facility] Consult Note Telephone: [phone]  Fax: [phone]  PATIENT NAME: Stephen [name] DOB: 10/16/1956 MRN: [id number]  PRIMARY CARE PROVIDER:   [name], MD  REFERRING PROVIDER:  [name], MD [address] [place],  [place] [address]  [place] Center[place]landis [name] [phone]  Due to the COVID-19 crisis, this visit was done via telemedicine from my office and it was initiated and consent by this patient and or family.  RECOMMENDATIONS and PLAN: 1.ACP: Full code with aggressive interventions  2.Dyspneic secondary to remain stable at present time.Continue O2, energy conservation, inhalation therapy for comfort; may consider Roxanol for comfort, will f/u with Dr Joylene Draft for his thoughts about Roxanol and Stephen [name]  3. Palliative care encounter; Palliative medicine team will continue to support patient, patient's family, and medical team. Visit consisted of counseling and education dealing with the complex and emotionally intense issues of symptom management and palliative care in the setting of serious and potentially life-threatening illness  I spent 35 minutes providing this consultation,  from 11:00am to 11:35am. More than 50% of the time in this consultation was spent coordinating communication.   HISTORY OF PRESENT ILLNESS:  Stephen [name] is a 63 y.o. 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  I called Stephen [name] for scheduled telemedicine telephonic  follow-up palliative care visit since initiating roxanol. Stephen [name] in agreement. We talked about how he is feeling today. He said that he is doing better. [facility] Nurse Navigator came and did oxygen saturation testing for which he did qualify for a portable oxygen tank. He is waiting for [facility] to deliver and provider Dr Joylene Draft did initiate and approved order. We talked about symptoms of shortness of breath and used of roxanol. He is using half the minimal dose but says he's only used it three times, benefiting relief from it. He feels like it is relieving his symptoms of shortness of breath. We talked about roxanol, scenarios of use. We talked about chronic disease progression. We talked about medical goals of care, continue to treat what is treatable though focus on Comfort also. Discussed role of palliative care and plan of care. Discuss will follow up in 4 weeks if needed are Spanish and he declined with palliative care follow-up visit. Stephen [name] in agreement and appointment scheduled. Questions answered to satisfaction. Emotional support provided. Contact information provided.  Palliative Care was asked to help to continue to address goals of care.   CODE STATUS: FULL CODE  PPS: 60% HOSPICE ELIGIBILITY/DIAGNOSIS: TBD  PAST MEDICAL HISTORY:  Past Medical History:  Diagnosis Date  . Allergic rhinitis, cause unspecified   . Anxiety   . Blindness of left eye   . CAD (coronary artery disease)    Coronary CTA 7/19: Calcium score 0, LAD with proximal discrete area of soft plaque;  Mid LAD FFR 0.86 - No hemodynamically significant coronary stenoses by CT FFR.  . Complication of anesthesia    difficulty awaking after colonoscopy   . Depression   . Dyspnea   . GERD (gastroesophageal reflux disease)   . Hypothyroidism   . Lung cancer ([place])  lung ca dx 06, has had chemo and radiation  . Osteoporosis   . Other emphysema ([place])    copd  . Throat cancer ([place])    throat ca dx 2007  .  Thrombosed hemorrhoids   . Tubular adenoma of colon 2009  . Unspecified vitamin D deficiency     SOCIAL HX:  Social History   Tobacco Use  . Smoking status: Former Smoker    Packs/day: 1.50    Years: 32.00    Pack years: 48.00    Types: Cigarettes    Quit date: 12/30/2004    Years since quitting: 14.7  . Smokeless tobacco: Never Used  . Tobacco comment: 1 ppd x 30 years  Substance Use Topics  . Alcohol use: Yes    Comment: 1- 2 beers monthly    ALLERGIES:  Allergies  Allergen Reactions  . Codeine Other (See Comments)    Unknown reaction. Mother told him he was allergic  . Cymbalta [Duloxetine Hcl] Nausea And Vomiting  . Montelukast Sodium Other (See Comments)    Causes sinusitis     PERTINENT MEDICATIONS:  Outpatient Encounter Medications as of 10/14/2019  Medication Sig  . albuterol (PROVENTIL HFA;VENTOLIN HFA) 108 (90 Base) MCG/ACT inhaler Inhale 1-2 puffs into the lungs every 6 (six) hours as needed for wheezing or shortness of breath.  Marland Kitchen albuterol (PROVENTIL) (2.5 MG/3ML) 0.083% nebulizer solution Take 2.5 mg by nebulization every 6 (six) hours as needed for wheezing or shortness of breath.   Marland Kitchen aspirin EC 81 MG tablet Take 81 mg by mouth daily.  Marland Kitchen ipratropium-albuterol (DUONEB) 0.5-2.5 (3) MG/3ML SOLN Take 3 mLs by nebulization every 6 (six) hours as needed (shortness of breath).  Marland Kitchen levothyroxine (SYNTHROID, LEVOTHROID) 125 MCG tablet Take 62.5 mcg by mouth daily before breakfast.   . mirtazapine (REMERON) 15 MG tablet Take 1 tablet (15 mg total) by mouth at bedtime.  . Multiple Vitamin (MULTIVITAMIN WITH MINERALS) TABS tablet Take 1 tablet by mouth daily. Centrum Silver  . nitroGLYCERIN (NITROSTAT) 0.4 MG SL tablet Place 1 tablet (0.4 mg total) under the tongue every 5 (five) minutes as needed for chest pain.  Marland Kitchen omeprazole (PRILOSEC) 40 MG capsule Take 1 capsule (40 mg total) by mouth daily.  . pravastatin (PRAVACHOL) 40 MG tablet Take 40 mg by mouth at bedtime.   .  temazepam (RESTORIL) 15 MG capsule Take 30 mg by mouth at bedtime.   . TRELEGY ELLIPTA 100-62.5-25 MCG/INH AEPB INHALE 1 PUFF INTO THE LUNGS DAILY  . Vitamin D, Ergocalciferol, (DRISDOL) 50000 UNITS CAPS Take 50,000 Units by mouth every 14 (fourteen) days.    No facility-administered encounter medications on file as of 10/14/2019.     PHYSICAL EXAM:  Deferred   Z , NP

## 2019-10-15 ENCOUNTER — Telehealth: Payer: Self-pay

## 2019-10-15 NOTE — Telephone Encounter (Signed)
Phone call placed to PCP office to follow up regarding request for order for portable oxygen concentrator. VM left. Request also faxed to PCP office

## 2019-10-15 NOTE — Telephone Encounter (Signed)
Phone call placed to patient to make him aware that the PCP and [facility] have been contacted to follow up with request for portable oxygen concentrator.

## 2019-10-15 NOTE — Telephone Encounter (Signed)
Patient had not received portable oxygen yet Phone call placed to [facility] to inquire if order had been received. No order for portable oxygen received at this time. Verified fax number: [phone]

## 2019-10-18 ENCOUNTER — Telehealth: Payer: Self-pay

## 2019-10-18 NOTE — Telephone Encounter (Signed)
Phone call received from patient who stated that [facility] called to deliver tanks but patient is asking for a portable oxygen concentration. Will contact PCP for clarification

## 2019-10-18 NOTE — Telephone Encounter (Signed)
VM left for Stephen [name], medical assistant to Dr. Joylene Draft, to request an order to clarify that portable oxygen concentrator vs portable be sent to [facility]. [facility] to fax PCP office appropriate forms to be filled out. PCP office made aware

## 2019-10-19 ENCOUNTER — Telehealth: Payer: Self-pay

## 2019-10-19 NOTE — Telephone Encounter (Signed)
Message received that patient called because he had not received portable oxygen concentrator. Phone call placed to [facility]. Spoke with Estill Bamberg who shared that they are waiting for the medical necessity form from PCP office. Message left for medical assistant, Sharrie Rothman. Sharrie Rothman to refax form to [facility]. Estill Bamberg made aware that form was being refaxed.

## 2019-10-20 ENCOUNTER — Telehealth: Payer: Self-pay

## 2019-10-20 NOTE — Telephone Encounter (Signed)
Phone call placed to Stephen [name] to confirm that form showing medical necessity was received from PCP. Form was received and is now sent for further approval which can take 48- 72 hours. If it is approved, then the portable oxygen concentrator will be delivered.

## 2019-10-28 ENCOUNTER — Telehealth: Payer: Self-pay

## 2019-10-28 NOTE — Telephone Encounter (Signed)
RN returned call to patient who reports he has not had BM for several days and has spoken with Dr. Joylene Draft. He asked that we call Dr. Joylene Draft to see what medication he plans to prescribe and to see if insurance covers it.  RN sent message to Dr. Joylene Draft.  Pt did receive portable oxygen

## 2019-10-29 ENCOUNTER — Telehealth: Payer: Self-pay

## 2019-10-29 NOTE — Telephone Encounter (Signed)
Follow up call to patient to check in from yesterday. No answer, message left

## 2019-11-02 ENCOUNTER — Telehealth: Payer: Self-pay

## 2019-11-02 NOTE — Telephone Encounter (Signed)
RN call to patient today to check on him after call into palliative care last evening.  Pt still complaint of constipation going on 2 weeks. RN had sent message to MD last week.  Pt was going to do enema last night but fell asleep. Did have some bowel movement las PM, no pain or uncomfortable feeling today.  RN called MD office and left voicemail wth nurse to see if any plans to call in medication for patient's constipation as patient had indicated that MD was going to send something.  Will await callback.

## 2019-11-11 ENCOUNTER — Other Ambulatory Visit: Payer: Self-pay

## 2019-11-11 ENCOUNTER — Other Ambulatory Visit: Payer: 59 | Admitting: Nurse Practitioner

## 2019-11-11 ENCOUNTER — Encounter: Payer: Self-pay | Admitting: Nurse Practitioner

## 2019-11-11 DIAGNOSIS — Z515 Encounter for palliative care: Secondary | ICD-10-CM

## 2019-11-11 DIAGNOSIS — J449 Chronic obstructive pulmonary disease, unspecified: Secondary | ICD-10-CM

## 2019-11-11 NOTE — Progress Notes (Signed)
[facility] Consult Note Telephone: [phone]  Fax: [phone]  PATIENT NAME: Stephen [name] DOB: 14-Apr-1956 MRN: [id number]  PRIMARY CARE PROVIDER:   [name], MD  REFERRING PROVIDER:  [name], MD [address] [place],   [address]  [place]au[place] [name] [phone]  Due to the COVID-19 crisis, this visit was done via telemedicine from my office and it was initiated and consent by this patient and or family.  RECOMMENDATIONS and PLAN: 1.ACP: Full code with aggressive interventions  2.Dyspneic secondary to remain stable at present time.Continue O2, energy conservation, inhalation therapy for comfort; may consider Roxanol for comfort, will f/u with Dr Joylene Draft for his thoughts about Roxanol and Stephen [name]  3.Palliative care encounter; Palliative medicine team will continue to support patient, patient's family, and medical team. Visit consisted of counseling and education dealing with the complex and emotionally intense issues of symptom management and palliative care in the setting of serious and potentially life-threatening illness  I spent 45 minutes providing this consultation,  from 11:00am to 11:45am. More than 50% of the time in this consultation was spent coordinating communication.   HISTORY OF PRESENT ILLNESS:  Stephen [name] is a 63 y.o. 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  I called Stephen [name] for schedule telemedicine telephonic follow-up  palliative care visit. We talked about purpose palliative care visit. We talked about how he is feeling today. He verbalize that today is a rainy day it makes it hard for him to breathe. We talked about the use of roxanol. He sure that he is only having the dose and using it when absolutely necessary. We talked about symptoms of shortness of breath, continuous oxygen. We talked about resting. We talked about the panic, anxiety cycle with the dyspnea. We talked about driving. We talked about quality of life versus quantity of days. We talked about medical goals of care aggressive versus conservative vs comfort Care. He talked at length about chronic disease progression. He asked why he was not a candidate for a lung transplant. We talked about different aspects of care. We talked about his pulmonologist as he has been having difficulty getting an appointment with him. We talked about alternative options. We talked about his appetite which Stephen [name] endorses he has not been the best. He shared that it has been very difficult with covid, social isolation. He talked about his neighbor helping him with groceries. Stephen [name] endorses that his neighbor has recently acquired coded so he is not been able to receive his help. Stephen [name] talked at length about to that as he continues to remain asymptomatic, minimize interactions with others. Stephen [name] talked about the challenges of being isolated at home by yourself. He verbalize said he talks to his girlfriend on the phone and does visit on occasion. We talked about coping strategies. We talked about medical goals of care including hospice benefit briefly what is comfort care vs. aggressive interventions. He does remain a DNR. We talked about quality of life versus quantity of days. Stephen [name] endorses that he feels like his body is shutting down. We talked  about disease processes his body has endured from the cancer to the COPD. We talked about O2 dependents. We  talked about continuing to use the roxanol and the importance in the process of healing. We talked about healing may not be curing. Healing maybe comfort. Therapeutic listening and emotional support provided. Discuss that will follow up in 2 weeks or sooner if needed. We talked about following up with pulmonology to schedule a visit for further exploration of any other options available for treatment. Questions answered dissatisfaction. Contact information provided.  Palliative Care was asked to help to continue to address goals of care.   CODE STATUS: Full code  PPS: 60% HOSPICE ELIGIBILITY/DIAGNOSIS: TBD  PAST MEDICAL HISTORY:  Past Medical History:  Diagnosis Date   Allergic rhinitis, cause unspecified    Anxiety    Blindness of left eye    CAD (coronary artery disease)    Coronary CTA 7/19: Calcium score 0, LAD with proximal discrete area of soft plaque;  Mid LAD FFR 0.86 - No hemodynamically significant coronary stenoses by CT FFR.   Complication of anesthesia    difficulty awaking after colonoscopy    Depression    Dyspnea    GERD (gastroesophageal reflux disease)    Hypothyroidism    Lung cancer ([place])    lung ca dx 06, has had chemo and radiation   Osteoporosis    Other emphysema ([place])    copd   Throat cancer ([facility])    throat ca dx 2007   Thrombosed hemorrhoids    Tubular adenoma of colon 2009   Unspecified vitamin D deficiency     SOCIAL HX:  Social History   Tobacco Use   Smoking status: Former Smoker    Packs/day: 1.50    Years: 32.00    Pack years: 48.00    Types: Cigarettes    Quit date: 12/30/2004    Years since quitting: 14.8   Smokeless tobacco: Never Used   Tobacco comment: 1 ppd x 30 years  Substance Use Topics   Alcohol use: Yes    Comment: 1- 2 beers monthly    ALLERGIES:  Allergies  Allergen Reactions   Codeine Other (See Comments)    Unknown reaction. Mother told him he was allergic   Cymbalta [Duloxetine Hcl] Nausea And  Vomiting   Montelukast Sodium Other (See Comments)    Causes sinusitis     PERTINENT MEDICATIONS:  Outpatient Encounter Medications as of 11/11/2019  Medication Sig   albuterol (PROVENTIL HFA;VENTOLIN HFA) 108 (90 Base) MCG/ACT inhaler Inhale 1-2 puffs into the lungs every 6 (six) hours as needed for wheezing or shortness of breath.   albuterol (PROVENTIL) (2.5 MG/3ML) 0.083% nebulizer solution Take 2.5 mg by nebulization every 6 (six) hours as needed for wheezing or shortness of breath.    aspirin EC 81 MG tablet Take 81 mg by mouth daily.   ipratropium-albuterol (DUONEB) 0.5-2.5 (3) MG/3ML SOLN Take 3 mLs by nebulization every 6 (six) hours as needed (shortness of breath).   levothyroxine (SYNTHROID, LEVOTHROID) 125 MCG tablet Take 62.5 mcg by mouth daily before breakfast.    mirtazapine (REMERON) 15 MG tablet Take 1 tablet (15 mg total) by mouth at bedtime.   Multiple Vitamin (MULTIVITAMIN WITH MINERALS) TABS tablet Take 1 tablet by mouth daily. Centrum Silver   nitroGLYCERIN (NITROSTAT) 0.4 MG SL tablet Place 1 tablet (0.4 mg total) under the tongue every 5 (five) minutes as needed for chest pain.   omeprazole (PRILOSEC) 40 MG capsule Take 1  capsule (40 mg total) by mouth daily.   pravastatin (PRAVACHOL) 40 MG tablet Take 40 mg by mouth at bedtime.    temazepam (RESTORIL) 15 MG capsule Take 30 mg by mouth at bedtime.    TRELEGY ELLIPTA 100-62.5-25 MCG/INH AEPB INHALE 1 PUFF INTO THE LUNGS DAILY   Vitamin D, Ergocalciferol, (DRISDOL) 50000 UNITS CAPS Take 50,000 Units by mouth every 14 (fourteen) days.    No facility-administered encounter medications on file as of 11/11/2019.     PHYSICAL EXAM:   Deferred  Shaun Runyon Z Khamryn Calderone, NP

## 2019-11-30 ENCOUNTER — Encounter: Payer: Self-pay | Admitting: Nurse Practitioner

## 2019-11-30 ENCOUNTER — Other Ambulatory Visit: Payer: Self-pay

## 2019-11-30 ENCOUNTER — Other Ambulatory Visit: Payer: 59 | Admitting: Nurse Practitioner

## 2019-11-30 DIAGNOSIS — J449 Chronic obstructive pulmonary disease, unspecified: Secondary | ICD-10-CM

## 2019-11-30 DIAGNOSIS — Z515 Encounter for palliative care: Secondary | ICD-10-CM

## 2019-11-30 NOTE — Progress Notes (Signed)
[name]: [phone]  Fax: [phone]  PATIENT NAME: Stephen [name] DOB: [birth year] MRN: [id number]  PRIMARY CARE PROVIDER:   [name], MD  REFERRING PROVIDER:  [name], MD [address] [place],  [place] [address]  [place] [phone]  Due to the COVID-19 crisis, this visit was done via telemedicine from my office and it was initiated and consent by this patient and or family.  RECOMMENDATIONS and PLAN: 1.ACP: Full code with aggressive interventions  2.Dyspneic secondary to remain stable at present time.Continue O2, energy conservation, inhalation therapy for comfort; may consider Roxanol for comfort, will f/u with Dr Joylene Draft for his thoughts about Roxanol and Mr. Mash  3.Palliative care encounter; Palliative medicine team will continue to support patient, patient's family, and medical team. Visit consisted of counseling and education dealing with the complex and emotionally intense issues of symptom management and palliative care in the setting of serious and potentially life-threatening illness  I spent 45 minutes providing this consultation,  From 10:00am to 10:45am. More than 50% of the time in this consultation was spent coordinating communication.   HISTORY OF PRESENT ILLNESS:  Stephen [name] is a 63 y.o. 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 I called [name] for scheduled telemedicine telephonic  palliative care follow-up visit. We talked about purpose of palliative care visit in [name] in agreement. We talked about how he is feeling today. He verbalize that he's doing okay today. We talked about good days and bad days. We talked about using roxanol as needed for shortness of breath. Mr Rockers endorses that he does use that the very sparingly and when he is very short of breath. We talked about mechanisms for use of roxanol. Mr. Court verbalized understanding. We talked about symptoms of shortness of breath at length. [name] does continue to wear his oxygen. He does continue to do daily nebulizer treatment switch have been effective and help. He has started Trilogy inhaler again which has given him relief as well. We talked about realistic expectations. We talked about chronic disease progression. We talked about medical goals of care aggressive versus conservative versus Comfort Care. [name] did bring up a lung transplant options. We talked about upcoming appointment with pulmonologist Dr. Patsey Berthold for further discussion to see if that is an option. Mr Virgil endorses he does not have an appointment as of yet he is waiting to hear when it will be scheduled. [name] did ask about Hospice Services. We reviewed hospice benefit and services offered. We talked about hospice eligibility. We talked about aggressive vs. Comfort Care interventions. Discuss that at present time he is asking about a lung transplant best plan of care will be to have him further discuss that option with Dr Patsey Berthold and after that appointment can revisit option of hospice did he decide to be more Comfort Care focused. Also discuss that when under hospice would have to possibly discontinue Trilogy inhaler and continue on nebulizer treatments and steroid management as needed,  unless [name] wishes to continue Trilogy it's not on hospice formulary so that would not  be covered. [name] an agreement.  We talked about his fears of not being able to breathe, anxiety. Continue to encourage in peace roxanol when he is short of breath. He talked about family dynamics. He talked about his daily routine and lifestyle. We talked about role of palliative care and plan of care. Discuss that will follow up in 4 weeks if needed or center should he declined. Mr Halling in agreement, appointment scheduled. Therapeutic listening and emotional support provided. Contact information provided. Questions answered to satisfaction. Will continues current plan with aggressive interventions and full code status at current time.  Palliative Care was asked to help to continue address goals of care.   CODE STATUS: full code  PPS: 60% HOSPICE ELIGIBILITY/DIAGNOSIS: TBD  PAST MEDICAL HISTORY:  Past Medical History:  Diagnosis Date   Allergic rhinitis, cause unspecified    Anxiety    Blindness of left eye    CAD (coronary artery disease)    Coronary CTA 7/19: Calcium score 0, LAD with proximal discrete area of soft plaque;  Mid LAD FFR 0.86 - No hemodynamically significant coronary stenoses by CT FFR.   Complication of anesthesia    difficulty awaking after colonoscopy    Depression    Dyspnea    GERD (gastroesophageal reflux disease)    Hypothyroidism    Lung cancer ([facility])    lung ca dx 06, has had chemo and radiation   Osteoporosis    Other emphysema ([facility])    copd   Throat cancer ([place] [facility])    throat ca dx 2007   Thrombosed hemorrhoids    Tubular adenoma of colon 2009   Unspecified vitamin D deficiency     SOCIAL HX:  Social History   Tobacco Use   Smoking status: Former Smoker    Packs/day: 1.50    Years: 32.00    Pack years: 48.00    Types: Cigarettes    Quit date: 12/30/2004    Years since quitting: 14.9   Smokeless tobacco: Never Used   Tobacco comment: 1 ppd x 30 years  Substance Use Topics   Alcohol use: Yes    Comment: 1- 2 beers monthly    ALLERGIES:  Allergies    Allergen Reactions   Codeine Other (See Comments)    Unknown reaction. Mother told him he was allergic   Cymbalta [Duloxetine Hcl] Nausea And Vomiting   Montelukast Sodium Other (See Comments)    Causes sinusitis     PERTINENT MEDICATIONS:  Outpatient Encounter Medications as of 11/30/2019  Medication Sig   albuterol (PROVENTIL HFA;VENTOLIN HFA) 108 (90 Base) MCG/ACT inhaler Inhale 1-2 puffs into the lungs every 6 (six) hours as needed for wheezing or shortness of breath.   albuterol (PROVENTIL) (2.5 MG/3ML) 0.083% nebulizer solution Take 2.5 mg by nebulization every 6 (six) hours as needed for wheezing or shortness of breath.    aspirin EC 81 MG tablet Take 81 mg by mouth daily.   ipratropium-albuterol (DUONEB) 0.5-2.5 (3) MG/3ML SOLN Take 3 mLs by nebulization every 6 (six) hours as needed (shortness of breath).   levothyroxine (SYNTHROID, LEVOTHROID) 125 MCG tablet Take 62.5 mcg by mouth daily before breakfast.    mirtazapine (REMERON) 15 MG tablet Take 1 tablet (15 mg total) by mouth at bedtime.   Multiple Vitamin (MULTIVITAMIN WITH MINERALS) TABS tablet Take 1 tablet by mouth daily. Centrum Silver   nitroGLYCERIN (NITROSTAT) 0.4 MG SL tablet Place 1 tablet (0.4 mg total) under the tongue every 5 (five) minutes as needed  for chest pain.   omeprazole (PRILOSEC) 40 MG capsule Take 1 capsule (40 mg total) by mouth daily.   pravastatin (PRAVACHOL) 40 MG tablet Take 40 mg by mouth at bedtime.    temazepam (RESTORIL) 15 MG capsule Take 30 mg by mouth at bedtime.    TRELEGY ELLIPTA 100-62.5-25 MCG/INH AEPB INHALE 1 PUFF INTO THE LUNGS DAILY   Vitamin D, Ergocalciferol, (DRISDOL) 50000 UNITS CAPS Take 50,000 Units by mouth every 14 (fourteen) days.    No facility-administered encounter medications on file as of 11/30/2019.     PHYSICAL EXAM:  Deferred  Zuriah Bordas Z Rubert Frediani, NP

## 2019-12-02 ENCOUNTER — Encounter (HOSPITAL_COMMUNITY): Payer: Self-pay | Admitting: Emergency Medicine

## 2019-12-02 ENCOUNTER — Emergency Department (HOSPITAL_COMMUNITY): Payer: 59

## 2019-12-02 ENCOUNTER — Emergency Department (HOSPITAL_COMMUNITY)
Admission: EM | Admit: 2019-12-02 | Discharge: 2019-12-03 | Disposition: A | Discharge status: Home / Self Care | Payer: 59 | Attending: Emergency Medicine | Admitting: Emergency Medicine

## 2019-12-02 DIAGNOSIS — Z8521 Personal history of malignant neoplasm of larynx: Secondary | ICD-10-CM | POA: Diagnosis not present

## 2019-12-02 DIAGNOSIS — I2510 Atherosclerotic heart disease of native coronary artery without angina pectoris: Secondary | ICD-10-CM | POA: Diagnosis not present

## 2019-12-02 DIAGNOSIS — J449 Chronic obstructive pulmonary disease, unspecified: Secondary | ICD-10-CM | POA: Insufficient documentation

## 2019-12-02 DIAGNOSIS — Z7982 Long term (current) use of aspirin: Secondary | ICD-10-CM | POA: Insufficient documentation

## 2019-12-02 DIAGNOSIS — Z87891 Personal history of nicotine dependence: Secondary | ICD-10-CM | POA: Insufficient documentation

## 2019-12-02 DIAGNOSIS — Z902 Acquired absence of lung [part of]: Secondary | ICD-10-CM | POA: Diagnosis not present

## 2019-12-02 DIAGNOSIS — R0602 Shortness of breath: Secondary | ICD-10-CM

## 2019-12-02 DIAGNOSIS — R0600 Dyspnea, unspecified: Secondary | ICD-10-CM | POA: Insufficient documentation

## 2019-12-02 DIAGNOSIS — Z85118 Personal history of other malignant neoplasm of bronchus and lung: Secondary | ICD-10-CM | POA: Diagnosis not present

## 2019-12-02 DIAGNOSIS — E039 Hypothyroidism, unspecified: Secondary | ICD-10-CM | POA: Diagnosis not present

## 2019-12-02 DIAGNOSIS — Z79899 Other long term (current) drug therapy: Secondary | ICD-10-CM | POA: Insufficient documentation

## 2019-12-02 DIAGNOSIS — F419 Anxiety disorder, unspecified: Secondary | ICD-10-CM | POA: Insufficient documentation

## 2019-12-02 MED ORDER — ALBUTEROL SULFATE HFA 108 (90 BASE) MCG/ACT IN AERS
2.0000 | INHALATION_SPRAY | Freq: Once | RESPIRATORY_TRACT | Status: AC
  Administered 2019-12-02: 2 via RESPIRATORY_TRACT
  Filled 2019-12-02: qty 6.7

## 2019-12-02 MED ORDER — ALBUTEROL SULFATE (2.5 MG/3ML) 0.083% IN NEBU: 5.0000 mg | INHALATION_SOLUTION | Freq: Once | RESPIRATORY_TRACT | Status: DC

## 2019-12-02 NOTE — ED Triage Notes (Signed)
100% on oxygen 2 L

## 2019-12-02 NOTE — ED Triage Notes (Signed)
EMS stated, he is having some SOB along with Anxiety. 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

## 2019-12-03 LAB — CBC WITH DIFFERENTIAL/PLATELET
Abs Immature Granulocytes: 0.06 10*3/uL (ref 0.00–0.07)
Basophils Absolute: 0.1 10*3/uL (ref 0.0–0.1)
Basophils Relative: 1 %
Eosinophils Absolute: 0.2 10*3/uL (ref 0.0–0.5)
Eosinophils Relative: 2 %
HCT: 42.1 % (ref 39.0–52.0)
Hemoglobin: 13.7 g/dL (ref 13.0–17.0)
Immature Granulocytes: 1 %
Lymphocytes Relative: 7 %
Lymphs Abs: 0.8 10*3/uL (ref 0.7–4.0)
MCH: 28.4 pg (ref 26.0–34.0)
MCHC: 32.5 g/dL (ref 30.0–36.0)
MCV: 87.2 fL (ref 80.0–100.0)
Monocytes Absolute: 0.7 10*3/uL (ref 0.1–1.0)
Monocytes Relative: 7 %
Neutro Abs: 8.4 10*3/uL — ABNORMAL HIGH (ref 1.7–7.7)
Neutrophils Relative %: 82 %
Platelets: 231 10*3/uL (ref 150–400)
RBC: 4.83 MIL/uL (ref 4.22–5.81)
RDW: 12.6 % (ref 11.5–15.5)
WBC: 10.2 10*3/uL (ref 4.0–10.5)
nRBC: 0 % (ref 0.0–0.2)

## 2019-12-03 LAB — BASIC METABOLIC PANEL
Anion gap: 8 (ref 5–15)
BUN: 10 mg/dL (ref 8–23)
CO2: 29 mmol/L (ref 22–32)
Calcium: 9.1 mg/dL (ref 8.9–10.3)
Chloride: 104 mmol/L (ref 98–111)
Creatinine, Ser: 0.82 mg/dL (ref 0.61–1.24)
GFR calc Af Amer: >60 mL/min (ref >60)
GFR calc non Af Amer: >60 mL/min (ref >60)
Glucose, Bld: 100 mg/dL — ABNORMAL HIGH (ref 70–99)
Potassium: 3.5 mmol/L (ref 3.5–5.1)
Sodium: 141 mmol/L (ref 135–145)

## 2019-12-03 LAB — TROPONIN I (HIGH SENSITIVITY)
Troponin I (High Sensitivity): 4 ng/L (ref <18)
Troponin I (High Sensitivity): 4 ng/L (ref <18)

## 2019-12-03 MED ORDER — ALUM & MAG HYDROXIDE-SIMETH 200-200-20 MG/5ML PO SUSP
30.0000 mL | Freq: Once | ORAL | Status: AC
  Administered 2019-12-03: 30 mL via ORAL
  Filled 2019-12-03: qty 30

## 2019-12-03 MED ORDER — LIDOCAINE VISCOUS HCL 2 % MT SOLN
15.0000 mL | Freq: Once | OROMUCOSAL | Status: AC
  Administered 2019-12-03: 15 mL via ORAL
  Filled 2019-12-03: qty 15

## 2019-12-03 MED ORDER — ONDANSETRON HCL 4 MG/2ML IJ SOLN
4.0000 mg | Freq: Once | INTRAMUSCULAR | Status: DC
  Filled 2019-12-03: qty 2

## 2019-12-03 MED ORDER — LORAZEPAM 2 MG/ML IJ SOLN
0.5000 mg | Freq: Once | INTRAMUSCULAR | Status: AC
  Administered 2019-12-03: 0.5 mg via INTRAMUSCULAR
  Filled 2019-12-03: qty 1

## 2019-12-03 NOTE — ED Provider Notes (Signed)
[facility] EMERGENCY DEPARTMENT Provider Note   CSN: [id number] Arrival date & time: 12/02/19  1846     History   Chief Complaint Chief Complaint  Patient presents with   Shortness of Breath   Anxiety    HPI Stephen [name] is a 63 y.o. male.     Patient to ED with SOB tonight while at home. History of Lung CA s/p lobectomy. No fever. He reports using oxygen as needed during the day and 2L while sleeping. He fell asleep 2 nights ago without his oxygen and reports increased SOB through the day yesterday causing panic attacks, which happens frequently. He used his Ativan, his nebulizer, prescribed morphine and his oxygen but did not get better. No nausea, vomiting.   The history is provided by the patient. No language interpreter was used.  Shortness of Breath Associated symptoms: no chest pain and no fever   Anxiety Associated symptoms include shortness of breath. Pertinent negatives include no chest pain.    Past Medical History:  Diagnosis Date   Allergic rhinitis, cause unspecified    Anxiety    Blindness of left eye    CAD (coronary artery disease)    Coronary CTA 7/19: Calcium score 0, LAD with proximal discrete area of soft plaque;  Mid LAD FFR 0.86 - No hemodynamically significant coronary stenoses by CT FFR.   Complication of anesthesia    difficulty awaking after colonoscopy    Depression    Dyspnea    GERD (gastroesophageal reflux disease)    Hypothyroidism    Lung cancer ([facility])    lung ca dx 06, has had chemo and radiation   Osteoporosis    Other emphysema ([facility])    copd   Throat cancer ([facility])    throat ca dx 2007   Thrombosed hemorrhoids    Tubular adenoma of colon 2009   Unspecified vitamin D deficiency     Patient Active Problem List   Diagnosis Date Noted   Shortness of breath 07/27/2019   Palliative care encounter 06/28/2019   Healthcare-associated pneumonia 07/10/2018   HCAP (healthcare-associated  pneumonia) 07/10/2018   Protein-calorie malnutrition, severe 06/25/2018   Acute exacerbation of chronic obstructive pulmonary disease (COPD) ([place] [facility]) 06/24/2018   Hypothyroidism 06/24/2018   Esophageal dysmotility 06/24/2018   GERD (gastroesophageal reflux disease) 06/24/2018   History of lung cancer and throat cancer 06/24/2018   FTT (failure to thrive) in adult 06/24/2018   Abnormal echocardiogram 06/24/2018   Hyperlipidemia 05/22/2018   CAD (coronary artery disease)    COPD with acute exacerbation ([facility]) 05/05/2018   Elevated troponin 05/04/2018   Acute respiratory failure with hypoxia ([facility]) 05/04/2018   Malignant neoplasm of glottis ([facility]) 01/13/2018   Alpha-1-antitrypsin deficiency carrier 07/17/2016   Eosinophilia 05/30/2016   History of seasonal allergies 05/30/2016   Stopped smoking with greater than 40 pack year history 05/30/2016   History of lobectomy of lung 05/30/2016   COLD (chronic obstructive lung disease) ([facility]) 07/27/2015   History of elevated PSA 07/27/2015   COPD exacerbation ([facility]) 01/23/2015   COPD, severe ([facility]) 01/12/2015   Research study patient 10/07/2014   Need for prophylactic vaccination against Streptococcus pneumoniae (pneumococcus) 09/15/2014   Chest pain 08/09/2013   History of TIA (transient ischemic attack) 08/09/2013   Otitis media of left ear 08/09/2013   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  has hardware   LUNG LOBECTOMY     RUL   MICROLARYNGOSCOPY WITH CO2 LASER AND EXCISION OF VOCAL CORD LESION N/A 12/19/2017   Procedure: MICROLARYNGOSCOPY  WITH BIOPSY;  Surgeon: Melida Quitter, MD;  Location: [facility];  Service: ENT;  Laterality: N/A;   ROTATOR CUFF REPAIR Left    SHOULDER SURGERY Right    rotator cuff   THROAT SURGERY     Laser surgery for throat cancer   TRANSTHORACIC ECHOCARDIOGRAM  01/13/2012   EF=>55%; trace MR/TR;         Home Medications    Prior to Admission medications   Medication Sig Start Date End Date Taking? Authorizing Provider  albuterol (PROVENTIL HFA;VENTOLIN HFA) 108 (90 Base) MCG/ACT inhaler Inhale 1-2 puffs into the lungs every 6 (six) hours as needed for wheezing or shortness of breath.    [provider]  albuterol (PROVENTIL) (2.5 MG/3ML) 0.083% nebulizer solution Take 2.5 mg by nebulization every 6 (six) hours as needed for wheezing or shortness of breath.     [provider]  aspirin EC 81 MG tablet Take 81 mg by mouth daily.    [provider]  ipratropium-albuterol (DUONEB) 0.5-2.5 (3) MG/3ML SOLN Take 3 mLs by nebulization every 6 (six) hours as needed (shortness of breath). 09/21/18   Brand Males, MD  levothyroxine (SYNTHROID, LEVOTHROID) 125 MCG tablet Take 62.5 mcg by mouth daily before breakfast.     [provider]  mirtazapine (REMERON) 15 MG tablet Take 1 tablet (15 mg total) by mouth at bedtime. 07/11/18   Florencia Reasons, MD  Multiple Vitamin (MULTIVITAMIN WITH MINERALS) TABS tablet Take 1 tablet by mouth daily. Centrum Silver    [provider]  nitroGLYCERIN (NITROSTAT) 0.4 MG SL tablet Place 1 tablet (0.4 mg total) under the tongue every 5 (five) minutes as needed for chest pain. 08/09/13   Delfina Redwood, MD  omeprazole (PRILOSEC) 40 MG capsule Take 1 capsule (40 mg total) by mouth daily. 04/08/18   Ladene Artist, MD  pravastatin (PRAVACHOL) 40 MG tablet Take 40 mg by mouth at bedtime.     [provider]  temazepam (RESTORIL) 15 MG capsule Take 30 mg by mouth at bedtime.     [provider]  Donnal Debar  100-62.5-25 MCG/INH AEPB INHALE 1 PUFF INTO THE LUNGS DAILY 10/29/18   Brand Males, MD  Vitamin D, Ergocalciferol, (DRISDOL) 50000 UNITS CAPS Take 50,000 Units by mouth every 14 (fourteen) days.  05/07/11   [provider]    Family History Family History  Problem Relation Age of Onset   Cancer Mother        Brain tumor   COPD Mother    Heart disease Father        CABG   Prostate cancer Father    Cystic fibrosis Sister        also heart failure   Mental illness Brother    Heart failure Paternal Grandmother    Colon cancer Neg Hx    Colon polyps Neg Hx    Esophageal cancer Neg Hx     Social History Social History   Tobacco Use   Smoking status: Former Smoker    Packs/day: 1.50  Years: 32.00    Pack years: 48.00    Types: Cigarettes    Quit date: 12/30/2004    Years since quitting: 14.9   Smokeless tobacco: Never Used   Tobacco comment: 1 ppd x 30 years  Substance Use Topics   Alcohol use: Yes    Comment: 1- 2 beers monthly   Drug use: No     Allergies   Codeine, Cymbalta [duloxetine hcl], and Montelukast sodium   Review of Systems Review of Systems  Constitutional: Negative for chills and fever.  HENT: Negative.   Respiratory: Positive for chest tightness and shortness of breath.   Cardiovascular: Negative.  Negative for chest pain.  Gastrointestinal: Positive for nausea.  Musculoskeletal: Negative.   Skin: Negative.   Neurological: Negative.   Psychiatric/Behavioral: The patient is nervous/anxious.      Physical Exam Updated Vital Signs BP 115/85    Pulse 69    Temp 98.5 F (36.9 C) (Oral)    Resp 18    SpO2 100%   Physical Exam Vitals signs and nursing note reviewed.  Constitutional:      Appearance: He is well-developed.  HENT:     Head: Normocephalic.  Neck:     Musculoskeletal: Normal range of motion and neck supple.  Cardiovascular:     Rate and Rhythm: Normal rate and regular rhythm.  Pulmonary:     Effort:  Pulmonary effort is normal.     Breath sounds: Examination of the left-upper field reveals decreased breath sounds. Decreased breath sounds and rhonchi present. No wheezing or rales.  Chest:     Chest wall: No tenderness.  Abdominal:     General: Bowel sounds are normal.     Palpations: Abdomen is soft.     Tenderness: There is no abdominal tenderness. There is no guarding or rebound.  Musculoskeletal: Normal range of motion.     Right lower leg: No edema.     Left lower leg: No edema.  Skin:    General: Skin is warm and dry.     Findings: No rash.  Neurological:     Mental Status: He is alert and oriented to person, place, and time.      ED Treatments / Results  Labs (all labs ordered are listed, but only abnormal results are displayed) Labs Reviewed  CBC WITH DIFFERENTIAL/PLATELET - Abnormal; Notable for the following components:      Result Value   Neutro Abs 8.4 (*)    All other components within normal limits  BASIC METABOLIC PANEL - Abnormal; Notable for the following components:   Glucose, Bld 100 (*)    All other components within normal limits  TROPONIN I (HIGH SENSITIVITY)  TROPONIN I (HIGH SENSITIVITY)    EKG EKG Interpretation  Date/Time:  Thursday December 02 2019 19:14:44 EST Ventricular Rate:  93 PR Interval:  178 QRS Duration: 140 QT Interval:  384 QTC Calculation: 477 R Axis:   97 Text Interpretation: Normal sinus rhythm Right bundle branch block Confirmed by Randal Buba, April ([id number]) on 12/03/2019 12:23:35 AM   Radiology Dg Chest 2 View  Result Date: 12/02/2019 CLINICAL DATA:  Shortness of breath. EXAM: CHEST - 2 VIEW COMPARISON:  September 26, 2018. FINDINGS: Stable cardiomediastinal silhouette. Stable postoperative changes are noted in the right lung apex and right lower lobe. No pneumothorax is noted. Left lung is clear. No acute abnormality is noted. Bony thorax is unremarkable. IMPRESSION: Stable postoperative changes are noted in right lung. No  acute cardiopulmonary abnormality seen. Electronically  Signed   By: Marijo Conception M.D.   On: 12/02/2019 19:30    Procedures Procedures (including critical care time)  Medications Ordered in ED Medications  albuterol (VENTOLIN HFA) 108 (90 Base) MCG/ACT inhaler 2 puff (2 puffs Inhalation Given 12/02/19 2145)     Initial Impression / Assessment and Plan / ED Course  I have reviewed the triage vital signs and the nursing notes.  Pertinent labs & imaging results that were available during my care of the patient were reviewed by me and considered in my medical decision making (see chart for details).        Patient with history of Lung CA s/p lobectomy and vocal cord CA s/p resection presents with uncontrolled SOB at home.   The patient talks at length about getting SOB with walking any distance and having symptoms of anxiety and panic when this happens, ie when he walks to the mailbox, tries to [place] his yard. He had been doing well then fell asleep without his oxygen the night of 12/2, with progressive SOB through the day on the 3rd, culminating in uncontrolled SOB and anxiety prompting ED visit.   Here the patient has been stable. No fever, no tachypnea, no tachycardia. Labs are unremarkable, EKG without ischemia and CXR shows only postsurgical changes.   He is medicated here with IM Ativan, albuterol inhaler, GI cocktail for indigestion after eating a sandwich in the department.   On re-evaluation he is feeling much better. No hypoxia during ED encounter. He is encouraged to stay on his O2 until he feels his symptoms are controlled. Encouraged f/u with Dr. Joylene Draft this coming week.  Final Clinical Impressions(s) / ED Diagnoses   Final diagnoses:  None   1. Dyspnea 2. Anxiety  ED Discharge Orders    None       Dennie Bible 12/03/19 0617    Palumbo, April, MD 12/03/19 [phone]

## 2019-12-03 NOTE — Discharge Instructions (Signed)
Continue your regular medications as prescribed. Stay on the oxygen at home until you can breathe comfortably without it during the day.   Return to the ED with any worsening symptoms or new concerns.

## 2019-12-06 ENCOUNTER — Telehealth: Payer: Self-pay | Admitting: Nurse Practitioner

## 2019-12-06 NOTE — Telephone Encounter (Signed)
Mr. Stephen [name] called to update that he had an emergency department visit for shortness of breath. Mr. Stephen [name] talked at length about the challenges and difficulties he has been experiencing with shortness of breath secondary to COPD. We talked about symptom management for shortness of breath including using the Roxanol. Mr. Stephen [name] endorses that he is cautious to use the Roxanol. We talked about the mechanism and usage. We talked about anxiety, using Ativan that he has as needed. We talked about the cycle of becoming short of breath and anxious and making the shortness of breath worse. We talked at length about his upcoming appointment with Dr Stephen [name] pulmonologist and his primary providerDr Stephen [name]. Discuss that will tried to contact Dr. Joylene [name] and Dr. Patsey [name] to see if he can be seen sooner. We talked about chronic disease progression. We talked about O2 dependency. We talked about palliative care role in plan of care. We talked about Hospice Services being a Medicare benefit and what is provided. Discussed at length the importance of having vet appointment with Dr Stephen [name] the pulmonologist prior to further discussion of hospice. Mr Stephen [name] has been asking about lung transplant for himself, if he would be a candidate. We talked about aggressive interventions versus Comfort Care. Discuss that since he continues to talk about possible lung transplant that hospice philosophy is more Comfort Care. Discussed will also ask palliative care social worker to call for support. Mr Stephen [name] in agreement with plan of care. Questions answered satisfaction. Therapeutic listening and emotional support provided.  Total time 50 minutes  Documentation 10 minutes  Phone discussion 40 minutes

## 2019-12-07 ENCOUNTER — Other Ambulatory Visit: Payer: 59

## 2019-12-07 ENCOUNTER — Telehealth: Payer: Self-pay | Admitting: Internal Medicine

## 2019-12-07 ENCOUNTER — Telehealth: Payer: Self-pay

## 2019-12-07 ENCOUNTER — Other Ambulatory Visit: Payer: Self-pay

## 2019-12-07 DIAGNOSIS — Z515 Encounter for palliative care: Secondary | ICD-10-CM

## 2019-12-07 NOTE — Telephone Encounter (Signed)
At the direction of Christin NP, phone call placed to Dr. Domingo Dimes office to request an appointment to be scheduled for patient due to worsening respiratory symptoms. Spoke with nurse who took information and message. Call back information provided

## 2019-12-07 NOTE — Progress Notes (Signed)
COMMUNITY PALLIATIVE CARE SW NOTE  PATIENT NAME: Stephen [name] DOB: [birth year] MRN: [id number]  PRIMARY CARE PROVIDER: [name], MD  RESPONSIBLE PARTY:  Acct ID - Guarantor Home Phone Work Phone Relationship Acct Type  [id number] - [place],[phone]  Self P/F     [address], [place], [place] [address]     PLAN OF CARE and INTERVENTIONS:             1. GOALS OF CARE/ ADVANCE CARE PLANNING:  Patient is FULL CODE. Patient's goal is to reduce panic attacks. 2. SOCIAL/EMOTIONAL/SPIRITUAL ASSESSMENT/ INTERVENTIONS:  SW completed TELEHEALTH visit with patient. Patient discussed recent ED visit and patient did not feel that this was helpful. Patient is glad he is home. Patient said he physically feels "well", other than his breathing and anxiety. Discussed anxiety and panic attacks in detail. Patient is able to recall recent anxiety attack that occurred prior to ED visit on 12/3. Patient called 9-1-1 because he could not breathe. Discussed medications that help, using nebulizer and oxygen and concentrating on controlled breathing during these instances. Discussed coping skills. Patient is going to try and shift his mind to acknowledge the panic attack but remind himself that this is temporary and he is ok. Patient is also going to close his eyes and repeating "I will be ok" to himself. Patient said that prayer also helps at times. Patient said that he is a member of [employer], and the congregation has been praying for him. Patient's pastor called this week and talked with him and prayed for him. Patient feels spiritually supported and feels that he can reach out to his paster, if needed. 3. PATIENT/CAREGIVER EDUCATION/ COPING:  Patient is alert, oriented. Patient is open with his feelings. Patient feels isolated at times. Expressed frustration with COVID-19 restrictions but also voiced understanding. SW provided education on safety, panic attacks and COVID-19. 4. PERSONAL EMERGENCY  PLAN:  Patient will call 9-1-1. Discussed emergency alert system options, patient is open to this and will consider.  5. COMMUNITY RESOURCES COORDINATION/ HEALTH CARE NAVIGATION:  Patient has appointment today . SW mentioned a Designer, fashion/clothing support calls and patient is interested, SW contacted Helene Kelp to discuss. SW scheduled next SW palliative visit for Monday, 12/14.  6. FINANCIAL/LEGAL CONCERNS/INTERVENTIONS: None.     SOCIAL HX:  Social History   Tobacco Use  . Smoking status: Former Smoker    Packs/day: 1.50    Years: 32.00    Pack years: 48.00    Types: Cigarettes    Quit date: 12/30/2004    Years since quitting: 14.9  . Smokeless tobacco: Never Used  . Tobacco comment: 1 ppd x 30 years  Substance Use Topics  . Alcohol use: Yes    Comment: 1- 2 beers monthly    CODE STATUS:   Code Status: Prior (FULL CODE) ADVANCED DIRECTIVES: N MOST FORM COMPLETE:  No. HOSPICE EDUCATION PROVIDED: None.  PPS: Patient is independent of ADLs, but gets weak and SOB easily.  Due to the COVID-19 crisis, this visit was done via telephone from my office and it was initiated and consent by this patient and/or family. This was a scheduled visit.   I spent 45 minutes with patient/family, from 12:15-1:00p providing education, support and consultation.    Margaretmary Lombard, LCSW

## 2019-12-07 NOTE — Telephone Encounter (Signed)
Received phone call from Dr. Domingo Dimes office with update that both providers need to be in agreement for patient to switch from one to the other. Once that is completed, office will reach out to patient to schedule. Christin NP updated.   Clarified with Dr. Golden Pop office that the desire to switch providers  was not initiated by Palliative care but by the patient. Palliative care assisted patient in reaching out to Dr. Patsey Berthold office.

## 2019-12-07 NOTE — Telephone Encounter (Addendum)
Spoke to [place] with [employer]. Stephen [name] stated that pt wished to switch from Dr. Chase Caller to Dr. Patsey Berthold.  Spoke to pt and verified this information. Pt wishes to proceed with switch. Pt is aware of office protocol and the need for both providers approval.  MR and LG please advise on switch. Thanks

## 2019-12-08 NOTE — Telephone Encounter (Signed)
Will await Dr. Domingo Dimes response.

## 2019-12-08 NOTE — Telephone Encounter (Signed)
Noted  

## 2019-12-08 NOTE — Telephone Encounter (Signed)
Pt has been scheduled for OV on 01/10/2020 at 3:45p with Dr. Patsey Berthold. Pt aware of appt.  Pt stated that he would like to switch, due to Dr. Golden Pop limited availability.   Will route to LG as an FYI.

## 2019-12-08 NOTE — Telephone Encounter (Signed)
This is fine 

## 2019-12-08 NOTE — Telephone Encounter (Signed)
I am not sure why he is switching physicians at this point in time.  My availability has been reduced due to having to take the load from Dr. Ashby Dawes and Dr. Alva Garnet patients as well as my own and do ICU.  I would be happy to see but I would know when availability will be there.

## 2019-12-13 ENCOUNTER — Other Ambulatory Visit: Payer: 59

## 2019-12-13 ENCOUNTER — Other Ambulatory Visit: Payer: Self-pay

## 2019-12-13 DIAGNOSIS — Z515 Encounter for palliative care: Secondary | ICD-10-CM

## 2019-12-13 NOTE — Progress Notes (Signed)
COMMUNITY PALLIATIVE CARE SW NOTE  PATIENT NAME: Stephen [name] DOB: 20-Oct-1956 MRN: [id number]  PRIMARY CARE PROVIDER: [name], MD  RESPONSIBLE PARTY:  Acct ID - Guarantor Home Phone Work Phone Relationship Acct Type  [id number] - [place],[phone]  Self P/F     [address], [place], [place] [address]     PLAN OF CARE and INTERVENTIONS:             1. GOALS OF CARE/ ADVANCE CARE PLANNING:  Patient is a FULL CODE. Goal is to maintain quality of life.  2. SOCIAL/EMOTIONAL/SPIRITUAL ASSESSMENT/ INTERVENTIONS:  SW met with patient in his home. Patient denies pain. Patient feels "good" today. Continued discussion of anxiety and panic attacks. Patient reviewed what he does when he has an "episode". Patient hs morphine but continues to hesitate taking it because he "does not want to be addicted". SW provided education and encouraged patient to continue conversations with PCP and palliative NP regarding his concerns. SW encouraged patient to try techniques discussed recently for panic attacks (I.e.-closing his eyes, deep breathing, repetitive statements of affirmation, prayer, mindfulness). Patient agreed. SW built rapport, provided emotional support, validated patient's feelings, used active and reflective listening and discussed goals. Patient appreciative of SW visit. 3. PATIENT/CAREGIVER EDUCATION/ COPING:  Patient was alert, oriented and engaged. Patient is open in expressing his feelings. Patient does get frustrated with his physical limitations due to SOB. Patient continues to try and connect with others via phone. Patient enjoys his dog and watching television. Discussed coping skills in detail.  4. PERSONAL EMERGENCY PLAN:  Patient will call 9-1-1. 5. COMMUNITY RESOURCES COORDINATION/ HEALTH CARE NAVIGATION:  Continue conversation about volunteer. Patient is scheduled to see Dr. Patsey Berthold on 1/11. 6. FINANCIAL/LEGAL CONCERNS/INTERVENTIONS:  None.     SOCIAL HX:  Social  History   Tobacco Use  . Smoking status: Former Smoker    Packs/day: 1.50    Years: 32.00    Pack years: 48.00    Types: Cigarettes    Quit date: 12/30/2004    Years since quitting: 14.9  . Smokeless tobacco: Never Used  . Tobacco comment: 1 ppd x 30 years  Substance Use Topics  . Alcohol use: Yes    Comment: 1- 2 beers monthly    CODE STATUS:   Code Status: Prior (FULL) ADVANCED DIRECTIVES: N MOST FORM COMPLETE:  No. HOSPICE EDUCATION PROVIDED: Yes.   PPS: Patient is independent of ADLs, but gets weak and SOB easily.  I spent68mnutes with patient/family, from2:00-3:10pproviding education, support and consultation.  WMargaretmary Lombard LCSW

## 2019-12-27 ENCOUNTER — Telehealth: Payer: Self-pay

## 2019-12-27 NOTE — Telephone Encounter (Signed)
Phone call placed to patient to check in regarding episodes of shortness of breath/anxiety. Spoke with patient who reported that he had episode of shortness of breath and anxiety that worsened throughout the night. Patient shared that he took 2 nebulizer treatments, 2 doses of morphine and 3 doses of lorazepam. Patient reports that he feels calm now but wanted to follow up regarding medications. Denies any worsening cough or congestion. Encouraged use of morphine as directed for shortness of breath. Patient shared that he does not want to go back to ED as last time he was there, he did not feel that it helped his situation. Primary Palliative NP Christin is out of the office this week. Visit scheduled for tomorrow 12/29/20202 @ 9am.

## 2019-12-28 ENCOUNTER — Other Ambulatory Visit: Payer: Self-pay

## 2019-12-28 ENCOUNTER — Other Ambulatory Visit: Payer: 59 | Admitting: Internal Medicine

## 2019-12-28 DIAGNOSIS — R0600 Dyspnea, unspecified: Secondary | ICD-10-CM

## 2019-12-28 DIAGNOSIS — Z515 Encounter for palliative care: Secondary | ICD-10-CM

## 2019-12-28 NOTE — Progress Notes (Signed)
Dec 29th, 2020 [facility] Palliative Care Consult Note Telephone: [phone]  Fax: [phone]  PATIENT NAME: Stephen [name] DOB: 22-Mar-1956 MRN: [id number] [address], [place], [place] [address]  PRIMARY CARE PROVIDER:   [name], MD Dr. Lysle [name] (to establish 01/09/2019)  REFERRING PROVIDER:  [name], MD [address] [place],  [place] [address]  RESPONSIBLE PARTY: (Self) Stephen [name] [phone]  ASSESSMENT / RECOMMENDATIONS:  1. Advance Care Planning:  A. Directives:  Full code with aggressive interventions  B. Goals of Care: Wants to minimize the dyspnea and anxiety he currently feels.   2. Symptom Management: A. Dyspnea: With only mild to moderate exertion.  By exam today, he maintains his oxygen saturations mid to high 90's on/off oxygen. His lungs are without wheezing. RR 16 at rest. Objectively, he seems to struggle with drawing a breath, but long exhalation and able to talk (baseline hoarse voice). He mentions that his upper airway is narrowed (d/t past surgeries for throat cancer) to the "point that it's  the diameter of a straw". He has been told that the only option, should this become a matter of life or death, would be a trach. He needs to be very careful when eating and is easily choked. He's had a trach in the past, and really hated the experience. He is getting very little relief with his prn MSO4 liquid concentrate, Albuterol nebs, or with Ativan. I mentioned a cool cloth to his face with a fan blowing on his face could help. Rest and limiting his activity are the most helpful. I counseled to limit use of MSO4. -has an appointment to establish with pulmonologist Stephen [name] Jan 11th, who may be able to help sort out upper airway vs COPD as a greater contributing factor to dyspnea.   B. Anxiety; intrinsic (ruminates over past adverse situations) and situational (COVID restrictions/dyspnea). Poorly managed with  prn Ativan. We did discuss in brief dangers of use of benzos setting of opioids. I counseled him to minimize this. Use of Albuterol heightens his anxiety state.  -consider augmentation of antidepressant treatment per PCP. Currently on Remeron 15 mg qhs. Has appointment to see Dr. Joylene [name] 12/19/2019 telehealth. -is being followed by our Palliative Care Social Worker for supportive counseling.   3. Cognitive / Functional status: (12/07/2019) weight 186lbs. At a height of 5'10" his BMI is 26.69kg/m2. He is independent in his ADLs; limited IDLs d/t dyspnea with moderate activities.   4. Family / Community Supports: Lives in own home with his little Stephen [name]. Long term girlfriend.   5. Follow up Palliative Care Visit: with his primary Palliative Care NP Stephen [name] 01/04/2020.  I spent 60 minutes providing this consultation from 9am to 10am. More than 50% of the time in this consultation was spent coordinating communication.   HISTORY OF PRESENT ILLNESS:  Stephen [name] is a 63 y.o.  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   12/02/2019: ER dyspnea/anxiety; conservative management IM Ativan/albuterol inhaler/GI cocktail for GERD This is a f/u Palliative Care visit from 11/30/2019.   CODE STATUS: Full Code  PPS: 60%  HOSPICE ELIGIBILITY/DIAGNOSIS: TBD  PAST MEDICAL HISTORY:  Past Medical History:  Diagnosis Date  . Allergic rhinitis, cause unspecified   . Anxiety   .  Blindness of left eye   . CAD (coronary artery disease)    Coronary CTA 7/19: Calcium score 0, LAD with proximal discrete area of soft plaque;  Mid LAD FFR 0.86 - No hemodynamically significant coronary stenoses by CT FFR.  .  Complication of anesthesia    difficulty awaking after colonoscopy   . Depression   . Dyspnea   . GERD (gastroesophageal reflux disease)   . Hypothyroidism   . Lung cancer ([place])    lung ca dx 06, has had chemo and radiation  . Osteoporosis   . Other emphysema ([place])    copd  . Throat cancer ([place])    throat ca dx 2007  . Thrombosed hemorrhoids   . Tubular adenoma of colon 2009  . Unspecified vitamin D deficiency     SOCIAL HX:  Social History   Tobacco Use  . Smoking status: Former Smoker    Packs/day: 1.50    Years: 32.00    Pack years: 48.00    Types: Cigarettes    Quit date: 12/30/2004    Years since quitting: 15.0  . Smokeless tobacco: Never Used  . Tobacco comment: 1 ppd x 30 years  Substance Use Topics  . Alcohol use: Yes    Comment: 1- 2 beers monthly    ALLERGIES:  Allergies  Allergen Reactions  . Codeine Other (See Comments)    Unknown reaction. Mother told him he was allergic  . Cymbalta [Duloxetine Hcl] Nausea And Vomiting  . Montelukast Sodium Other (See Comments)    Causes sinusitis     PERTINENT MEDICATIONS:  Outpatient Encounter Medications as of 12/28/2019  Medication Sig  . albuterol (PROVENTIL HFA;VENTOLIN HFA) 108 (90 Base) MCG/ACT inhaler Inhale 1-2 puffs into the lungs every 6 (six) hours as needed for wheezing or shortness of breath.  Marland Kitchen albuterol (PROVENTIL) (2.5 MG/3ML) 0.083% nebulizer solution Take 2.5 mg by nebulization every 6 (six) hours as needed for wheezing or shortness of breath.   Marland Kitchen aspirin EC 81 MG tablet Take 81 mg by mouth daily.  . benzonatate (TESSALON) 100 MG capsule Take 100 mg by mouth 3 (three) times daily as needed for cough.  . levothyroxine (SYNTHROID, LEVOTHROID) 125 MCG tablet Take 62.5 mcg by mouth daily before breakfast.   . LORazepam (ATIVAN) 0.5 MG tablet Take 0.5 mg by mouth every 8 (eight) hours as needed for anxiety.  . mirtazapine (REMERON) 15 MG tablet Take 1 tablet (15 mg total) by mouth at bedtime.  . Morphine  Sulfate (MORPHINE CONCENTRATE) 10 mg / 0.5 ml concentrated solution Take 5 mg by mouth every 4 (four) hours as needed for severe pain or shortness of breath.  . pravastatin (PRAVACHOL) 40 MG tablet Take 40 mg by mouth at bedtime.   . Vitamin D, Ergocalciferol, (DRISDOL) 50000 UNITS CAPS Take 50,000 Units by mouth every 14 (fourteen) days.   Marland Kitchen ipratropium-albuterol (DUONEB) 0.5-2.5 (3) MG/3ML SOLN Take 3 mLs by nebulization every 6 (six) hours as needed (shortness of breath).  . Multiple Vitamin (MULTIVITAMIN WITH MINERALS) TABS tablet Take 1 tablet by mouth daily. Centrum Silver  . nitroGLYCERIN (NITROSTAT) 0.4 MG SL tablet Place 1 tablet (0.4 mg total) under the tongue every 5 (five) minutes as needed for chest pain.  Marland Kitchen omeprazole (PRILOSEC) 40 MG capsule Take 1 capsule (40 mg total) by mouth daily.  . temazepam (RESTORIL) 15 MG capsule Take 30 mg by mouth at bedtime.   . TRELEGY ELLIPTA 100-62.5-25 MCG/INH AEPB INHALE 1 PUFF INTO THE LUNGS  DAILY   No facility-administered encounter medications on file as of 12/28/2019.    PHYSICAL EXAM:   BP 116/74, Sat 98% (4L), RA 95%. HR 74-82, RR 16 (rest/RA) General: NAD, chatty, mildly anxious Cardiovascular: regular rate and rhythm Pulmonary: clear ant and posterior lung fields. No upper airway strider Extremities: no edema, no joint deformities Skin: no rashes Neurological: grossly non-focal  Julianne Handler, NP

## 2019-12-29 ENCOUNTER — Encounter: Payer: Self-pay | Admitting: Internal Medicine

## 2020-01-04 ENCOUNTER — Other Ambulatory Visit: Payer: Self-pay

## 2020-01-04 ENCOUNTER — Other Ambulatory Visit: Payer: 59 | Admitting: Nurse Practitioner

## 2020-01-04 ENCOUNTER — Encounter: Payer: Self-pay | Admitting: Nurse Practitioner

## 2020-01-04 DIAGNOSIS — J449 Chronic obstructive pulmonary disease, unspecified: Secondary | ICD-10-CM

## 2020-01-04 DIAGNOSIS — Z515 Encounter for palliative care: Secondary | ICD-10-CM

## 2020-01-04 NOTE — Progress Notes (Signed)
[facility] Consult Note Telephone: [phone]  Fax: [phone]  PATIENT NAME: Stephen [name] DOB: 05/07/1956 MRN: [id number]  PRIMARY CARE PROVIDER:   [name], MD  REFERRING PROVIDER:  [name], MD [address] [place],  [place] [address] [place] [id number]  Due to the COVID-19 crisis, this visit was done via telemedicine from my office and it was initiated and consent by this patient and or family.  RECOMMENDATIONS and PLAN: 1.ACP: Full code with aggressive interventions  2.Dyspneic secondary to remain stable at present time.Continue O2, energy conservation, inhalation therapy for comfort; continue roxanol; ativan and increase nebulizers  3.Palliative care encounter; Palliative medicine team will continue to support patient, patient's family, and medical team. Visit consisted of counseling and education dealing with the complex and emotionally intense issues of symptom management and palliative care in the setting of serious and potentially life-threatening illness   I spent 70 minutes providing this consultation,  from 12:50 to 2:00pm. More than 50% of the time in this consultation was spent coordinating communication.   HISTORY OF PRESENT ILLNESS:  Stephen [name] is a 64 y.o. 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 I called [name] for scheduled telemedicine telephonic as video not available follow-up palliative care visit. [name]  in agreement. We talked about how Mr. Turvey feeling. [name] endorses he continues to have shortness of breath, anxiety that frightens him to the point where he feels like he is going to stop breathing. [name] does continue to use a roxanol typically one dose a day and his Ativan. We talked about the importance of symptom management. We talked about the importance of managing his anxiety. We talked about shortness of breath, oxygen. Mr. Trefry did have an in-person visit by palliative care provider while this provider was out of office. 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 [name] question the use of roxanol and Ativan. We reviewed the importance of using the roxanol and Ativan at this point in time. We talked about the mechanism and benefit, risk. [name] ask about the lung transplant option. Discuss that he is an upcoming appointment with Dr Patsey Berthold pulmonologist for further discussion and evaluation. We talked about lunch transplant that likely he is not a candidate due to lung surgeries and lung cancer. [name] also asked about a tracheostomy. [name] has had a tracheostomy in the past. We talked about the benefits vs the risk. [name] endorses he does I think it could possibly improve the symptoms, shortness of breath but it would not improve his quality of life. [name] talked about the challenges when he had his trach and how messy is it is. [name] talked about not being able to swim with a trach. We talked about increasing use of nebulizers. We talked about his upcoming appointment with Dr Patsey Berthold. Discuss to make a list of questions that he would have for visit. We talked about medical goals of care. We talked about aggressive versus comfort care.  We talked about quality of life versus quality of days. [name] talked a lot about family  dynamics, his currents challenges that he is facing with family and his girlfriend. He talked about his lack of support system. [name] talked at length about his concern for not having anyone available to help him. We talked about [name] worker was working on a chaplain referral. We also talked about option of Hospice Services if he chooses no longer to seek aggressive interventions. We talked about Medicare benefit of hospice. We talked about coping strategies and grieving process. We talked about role of palliative care and plan of care. Discuss that links with [name] his fears and concerns. Discuss that will follow up with palliative care visit after his appointment with Dr. Patsey Berthold pulmonologist for further discussion of goals of care. [name] in agreement and appointment schedule. Therapeutic listening and emotional support provided. Questions answered satisfaction. Contact information provided. Palliative Care was asked to help to continue to address goals of care.   CODE STATUS: Full code  PPS: 60% HOSPICE ELIGIBILITY/DIAGNOSIS: TBD  PAST MEDICAL HISTORY:  Past Medical History:  Diagnosis Date  . Allergic rhinitis, cause unspecified   . Anxiety   . Blindness of left eye   . CAD (coronary artery disease)    Coronary CTA 7/19: Calcium score 0, LAD with proximal discrete area of soft plaque;  Mid LAD FFR 0.86 - No hemodynamically significant coronary stenoses by CT FFR.  . Complication of anesthesia    difficulty awaking after colonoscopy   . Depression   . Dyspnea   . GERD (gastroesophageal reflux disease)   . Hypothyroidism   . Lung cancer ([facility])    lung ca dx 06, has had chemo and radiation  . Osteoporosis   . Other emphysema ([facility])    copd  . Throat cancer ([facility])    throat ca dx 2007  . Thrombosed hemorrhoids   . Tubular adenoma of colon 2009  . Unspecified vitamin D deficiency     SOCIAL HX:  Social History   Tobacco Use  .  Smoking status: Former Smoker    Packs/day: 1.50    Years: 32.00    Pack years: 48.00    Types: Cigarettes    Quit date: 12/30/2004    Years since quitting: 15.0  . Smokeless tobacco: Never Used  . Tobacco comment: 1 ppd x 30 years  Substance Use Topics  . Alcohol use: Yes    Comment: 1- 2 beers monthly    ALLERGIES:  Allergies  Allergen Reactions  . Codeine Other (See Comments)    Unknown reaction. Mother told him he was allergic  . Cymbalta [Duloxetine Hcl] Nausea And Vomiting  . Montelukast Sodium Other (See Comments)    Causes sinusitis     PERTINENT MEDICATIONS:  Outpatient Encounter Medications as of 01/04/2020  Medication Sig  . albuterol (PROVENTIL HFA;VENTOLIN HFA) 108 (90 Base) MCG/ACT inhaler Inhale 1-2 puffs into the lungs every 6 (six) hours as needed for wheezing or shortness of breath.  Marland Kitchen albuterol (PROVENTIL) (2.5 MG/3ML) 0.083% nebulizer solution Take 2.5 mg by nebulization every 6 (six) hours as needed for wheezing or shortness of breath.   Marland Kitchen aspirin EC 81 MG tablet Take 81 mg by mouth daily.  . benzonatate (TESSALON) 100 MG capsule Take 100 mg by mouth 3 (three) times daily as needed for cough.  Marland Kitchen ipratropium-albuterol (DUONEB) 0.5-2.5 (3) MG/3ML SOLN Take 3 mLs by nebulization every 6 (  six) hours as needed (shortness of breath).  Marland Kitchen levothyroxine (SYNTHROID, LEVOTHROID) 125 MCG tablet Take 62.5 mcg by mouth daily before breakfast.   . LORazepam (ATIVAN) 0.5 MG tablet Take 0.5 mg by mouth every 8 (eight) hours as needed for anxiety.  . mirtazapine (REMERON) 15 MG tablet Take 1 tablet (15 mg total) by mouth at bedtime.  . Morphine Sulfate (MORPHINE CONCENTRATE) 10 mg / 0.5 ml concentrated solution Take 5 mg by mouth every 4 (four) hours as needed for severe pain or shortness of breath.  . Multiple Vitamin (MULTIVITAMIN WITH MINERALS) TABS tablet Take 1 tablet by mouth daily. Centrum Silver  . nitroGLYCERIN (NITROSTAT) 0.4 MG SL tablet Place 1 tablet (0.4 mg total)  under the tongue every 5 (five) minutes as needed for chest pain.  Marland Kitchen omeprazole (PRILOSEC) 40 MG capsule Take 1 capsule (40 mg total) by mouth daily.  . pravastatin (PRAVACHOL) 40 MG tablet Take 40 mg by mouth at bedtime.   . temazepam (RESTORIL) 15 MG capsule Take 30 mg by mouth at bedtime.   . TRELEGY ELLIPTA 100-62.5-25 MCG/INH AEPB INHALE 1 PUFF INTO THE LUNGS DAILY  . Vitamin D, Ergocalciferol, (DRISDOL) 50000 UNITS CAPS Take 50,000 Units by mouth every 14 (fourteen) days.    No facility-administered encounter medications on file as of 01/04/2020.    PHYSICAL EXAM:   Deferred  Khalel Alms Z Aneliz Carbary, NP

## 2020-01-10 ENCOUNTER — Other Ambulatory Visit: Payer: Self-pay

## 2020-01-10 ENCOUNTER — Ambulatory Visit: Payer: 59 | Admitting: Pulmonary Disease

## 2020-01-10 VITALS — BP 110/60 | HR 85 | Temp 98.0°F | Ht 70.0 in | Wt 188.6 lb

## 2020-01-10 DIAGNOSIS — Z85819 Personal history of malignant neoplasm of unspecified site of lip, oral cavity, and pharynx: Secondary | ICD-10-CM

## 2020-01-10 DIAGNOSIS — Z85118 Personal history of other malignant neoplasm of bronchus and lung: Secondary | ICD-10-CM

## 2020-01-10 DIAGNOSIS — J449 Chronic obstructive pulmonary disease, unspecified: Secondary | ICD-10-CM

## 2020-01-10 MED ORDER — BUDESONIDE 0.25 MG/2ML IN SUSP: 0.2500 mg | Freq: Two times a day (BID) | RESPIRATORY_TRACT | 2 refills | Status: DC

## 2020-01-10 NOTE — Patient Instructions (Addendum)
1.  We will switch your breathing medications to all nebulized medications.  I am not confident that you are able to get the proper medication dosage through an inhaler.  This is because of the severity of your lung function.  2.  I recommend you use DuoNeb 4 times a day you may have an extra DuoNeb during the day if you are extremely short of breath.  3.  Pulmicort twice a day after DuoNeb.  4.  I will communicate with Dr. Joylene Draft to optimize your anxiety medication.  5.  I will see you in follow-up in 4 to 6 weeks time call sooner should any new difficulties arise.  6.  Do remember the upper airway exercises we discussed using a harmonica or your flutter valve.

## 2020-01-10 NOTE — Progress Notes (Signed)
 Assessment & Plan:  1. Stage 4 very severe COPD by GOLD classification (HCC) (Primary)  2. History of throat cancer  3. History of lung cancer   Patient Instructions  1.  We will switch your breathing medications to all nebulized medications.  I am not confident that you are able to get the proper medication dosage through an inhaler.  This is because of the severity of your lung function.  2.  I recommend you use DuoNeb 4 times a day you may have an extra DuoNeb during the day if you are extremely short of breath.  3.  Pulmicort  twice a day after DuoNeb.  4.  I will communicate with Dr. Shayne to optimize your anxiety medication.  5.  I will see you in follow-up in 4 to 6 weeks time call sooner should any new difficulties arise.  6.  Do remember the upper airway exercises we discussed using a harmonica or your flutter valve.    Please note: late entry documentation due to logistical difficulties during COVID-19 pandemic. This note is filed for information purposes only, and is not intended to be used for billing, nor does it represent the full scope/nature of the visit in question. Please see any associated scanned media linked to date of encounter for additional pertinent information.  Subjective:    HPI: Stephen [name] is a 65 y.o. male presenting to the pulmonology clinic on 01/10/2020 with report of: Follow-up (former MR pt- pt reports of sob with exertion and occ wheezing.  on 2L.)     Outpatient Encounter Medications as of 01/10/2020  Medication Sig   aspirin  EC 81 MG tablet Take 81 mg by mouth daily.   levothyroxine  (SYNTHROID , LEVOTHROID) 125 MCG tablet Take 62.5 mcg by mouth daily before breakfast.    LORazepam  (ATIVAN ) 0.5 MG tablet Take 0.5 mg by mouth every 8 (eight) hours as needed for anxiety.   Morphine Sulfate (MORPHINE CONCENTRATE) 10 mg / 0.5 ml concentrated solution Take 5 mg by mouth every 4 (four) hours as needed for severe pain or shortness of  breath.   Multiple Vitamin (MULTIVITAMIN WITH MINERALS) TABS tablet Take 1 tablet by mouth daily. Centrum Silver   nitroGLYCERIN  (NITROSTAT ) 0.4 MG SL tablet Place 1 tablet (0.4 mg total) under the tongue every 5 (five) minutes as needed for chest pain.   omeprazole  (PRILOSEC) 40 MG capsule Take 1 capsule (40 mg total) by mouth daily.   pravastatin  (PRAVACHOL ) 40 MG tablet Take 40 mg by mouth at bedtime.    temazepam  (RESTORIL ) 15 MG capsule Take 30 mg by mouth at bedtime.   Vitamin D , Ergocalciferol , (DRISDOL ) 50000 UNITS CAPS Take 50,000 Units by mouth every 14 (fourteen) days.  (Patient not taking: Reported on 10/05/2024)   [DISCONTINUED] albuterol  (PROVENTIL  HFA;VENTOLIN  HFA) 108 (90 Base) MCG/ACT inhaler Inhale 1-2 puffs into the lungs every 6 (six) hours as needed for wheezing or shortness of breath. (Patient not taking: Reported on 09/23/2024)   [DISCONTINUED] albuterol  (PROVENTIL ) (2.5 MG/3ML) 0.083% nebulizer solution Take 2.5 mg by nebulization every 6 (six) hours as needed for wheezing or shortness of breath.   [DISCONTINUED] benzonatate  (TESSALON ) 100 MG capsule Take 100 mg by mouth 3 (three) times daily as needed for cough.   [DISCONTINUED] ipratropium-albuterol  (DUONEB) 0.5-2.5 (3) MG/3ML SOLN Take 3 mLs by nebulization every 6 (six) hours as needed (shortness of breath).   [DISCONTINUED] mirtazapine  (REMERON ) 15 MG tablet Take 1 tablet (15 mg total) by mouth at bedtime.   [  DISCONTINUED] budesonide  (PULMICORT ) 0.25 MG/2ML nebulizer solution Take 2 mLs (0.25 mg total) by nebulization 2 (two) times daily.   [DISCONTINUED] TRELEGY ELLIPTA  100-62.5-25 MCG/INH AEPB INHALE 1 PUFF INTO THE LUNGS DAILY (Patient not taking: Reported on 01/10/2020)   No facility-administered encounter medications on file as of 01/10/2020.      Objective:   Vitals:   01/10/20 1541  BP: 110/60  Pulse: 85  Temp: 98 F (36.7 C)  Height: 5' 10 (1.778 m)  Weight: 188 lb 9.6 oz (85.5 kg)  SpO2: 100%   TempSrc: Temporal  BMI (Calculated): 27.06     Physical exam documentation is limited by delayed entry of information.

## 2020-01-18 ENCOUNTER — Other Ambulatory Visit: Payer: Self-pay

## 2020-01-18 ENCOUNTER — Encounter: Payer: Self-pay | Admitting: Nurse Practitioner

## 2020-01-18 ENCOUNTER — Other Ambulatory Visit: Payer: 59 | Admitting: Nurse Practitioner

## 2020-01-18 DIAGNOSIS — Z515 Encounter for palliative care: Secondary | ICD-10-CM

## 2020-01-18 DIAGNOSIS — J449 Chronic obstructive pulmonary disease, unspecified: Secondary | ICD-10-CM

## 2020-01-18 NOTE — Progress Notes (Signed)
[facility] Consult Note Telephone: [phone]  Fax: [phone]  PATIENT NAME: Stephen [name] DOB: 07-13-1956 MRN: [id number]  PRIMARY CARE PROVIDER:   [name], MD  REFERRING PROVIDER:  [name], MD [address] [place],  [place] [address]  [place] [phone]  Due to the COVID-19 crisis, this visit was done via telemedicine from my office and it was initiated and consent by this patient and or family.  RECOMMENDATIONS and PLAN: 1.ACP: Full code with aggressive interventions  2.Dyspneic secondary to remain stable at present time.Continue O2, energy conservation, inhalation therapy for comfort; continue roxanol; ativan and increase nebulizers  3.Palliative care encounter; Palliative medicine team will continue to support patient, patient's family, and medical team. Visit consisted of counseling and education dealing with the complex and emotionally intense issues of symptom management and palliative care in the setting of serious and potentially life-threatening illness  I spent 45 minutes providing this consultation,  from 2:00pm to 2:45pm. More than 50% of the time in this consultation was spent coordinating communication.   HISTORY OF PRESENT ILLNESS:  Stephen [name] is a 64 y.o. 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 I called Stephen [name] for scheduled telemedicine telephonic is video not available palliative care follow-up visit. Stephen [name]  and I talked about purpose of visit and he was in agreement. We talked about how he is feeling. Stephen [name] endorses that he is doing better. We talked about his visit with Stephen [name] 1 / 11 / 2021 for which he was placed on Duo nebulizers and follow-up in four to six weeks with optimizing anxiety medication through Stephen [name]. Also started pulmicort bid after duoneb. Mr. Mckelvy endorses that he did not talk to Stephen [name] about a lung transplant or the trach. We review the reasons the lung transplant would not be an option due to lung surgery is in lung cancers. We talked about tracheostomy and Mr. Rozo endorses that he wouldn't want one he had one in the past but if it's absolutely necessary he would proceed with one. We talked about symptoms of shortness of breath and anxiety. We talked about medication management including Roxanol. We talked about quality of life at length. Stephen [name] talked at length about lack of family support, social support system. We talked about palliative care social worker and will send a message to follow up for supportive visit. We talked about medical goals of care for which he does still want to remain a full code. We talked about hospice and roll palliative care and plan of care. Most of visit was therapeutic listening an emotional support where Mr [name] endorses that his quality of life is very poor. He talked about how he hoped his life would have been and how now it's turned out. Stephen [name] talked at length about family Dynamics and lack of social support. Stephen [name] talked about covid-19 pandemic was social isolation. Mr [name] endorses he did go to the grocery store the other day but it was difficult to breathe with a mask on. We talked about importance of staying safe with, distancing, masking and avoid  great. Mr [name] talked about his poor function quality of life. Stephen [name] talked about what he's not able to do functionally and what he wishes to  be able to do including fishing, riding his motorcycle. We talked about quality of life. We talked about coping strategies. Talked about grieving. We talked about follow-up visit in 4 weeks if needed or sooner should he declined. Mr [name] in agreement and appointment scheduled. Questions answered to satisfaction. Contact information Palliative Care was asked to help to continue to address goals of care.   CODE STATUS: Full code  PPS: 50% HOSPICE ELIGIBILITY/DIAGNOSIS: TBD  PAST MEDICAL HISTORY:  Past Medical History:  Diagnosis Date  . Allergic rhinitis, cause unspecified   . Anxiety   . Blindness of left eye   . CAD (coronary artery disease)    Coronary CTA 7/19: Calcium score 0, LAD with proximal discrete area of soft plaque;  Mid LAD FFR 0.86 - No hemodynamically significant coronary stenoses by CT FFR.  . Complication of anesthesia    difficulty awaking after colonoscopy   . Depression   . Dyspnea   . GERD (gastroesophageal reflux disease)   . Hypothyroidism   . Lung cancer ([facility])    lung ca dx 06, has had chemo and radiation  . Osteoporosis   . Other emphysema ([place])    copd  . Throat cancer ([facility] [facility])    throat ca dx 2007  . Thrombosed hemorrhoids   . Tubular adenoma of colon 2009  . Unspecified vitamin D deficiency     SOCIAL HX:  Social History   Tobacco Use  . Smoking status: Former Smoker    Packs/day: 1.50    Years: 32.00    Pack years: 48.00    Types: Cigarettes    Quit date: 12/30/2004    Years since quitting: 15.0  . Smokeless tobacco: Never Used  . Tobacco comment: 1 ppd x 30 years  Substance Use Topics  . Alcohol use: Yes    Comment: 1- 2 beers monthly    ALLERGIES:  Allergies  Allergen Reactions  . Codeine Other (See Comments)    Unknown reaction. Mother told him he was allergic  . Cymbalta [Duloxetine Hcl] Nausea And Vomiting  . Montelukast Sodium Other (See Comments)    Causes sinusitis     PERTINENT MEDICATIONS:  Outpatient Encounter  Medications as of 01/18/2020  Medication Sig  . albuterol (PROVENTIL HFA;VENTOLIN HFA) 108 (90 Base) MCG/ACT inhaler Inhale 1-2 puffs into the lungs every 6 (six) hours as needed for wheezing or shortness of breath.  Marland Kitchen albuterol (PROVENTIL) (2.5 MG/3ML) 0.083% nebulizer solution Take 2.5 mg by nebulization every 6 (six) hours as needed for wheezing or shortness of breath.   Marland Kitchen aspirin EC 81 MG tablet Take 81 mg by mouth daily.  . benzonatate (TESSALON) 100 MG capsule Take 100 mg by mouth 3 (three) times daily as needed for cough.  . budesonide (PULMICORT) 0.25 MG/2ML nebulizer solution Take 2 mLs (0.25 mg total) by nebulization 2 (two) times daily.  Marland Kitchen ipratropium-albuterol (DUONEB) 0.5-2.5 (3) MG/3ML SOLN Take 3 mLs by nebulization every 6 (six) hours as needed (shortness of breath).  Marland Kitchen levothyroxine (SYNTHROID, LEVOTHROID) 125 MCG tablet Take 62.5 mcg by mouth daily before breakfast.   . LORazepam (ATIVAN) 0.5 MG tablet Take 0.5 mg by mouth every 8 (eight) hours as needed for anxiety.  . mirtazapine (REMERON) 15 MG tablet Take 1 tablet (15 mg total) by mouth at bedtime.  . Morphine Sulfate (MORPHINE  CONCENTRATE) 10 mg / 0.5 ml concentrated solution Take 5 mg by mouth every 4 (four) hours as needed for severe pain or shortness of breath.  . Multiple Vitamin (MULTIVITAMIN WITH MINERALS) TABS tablet Take 1 tablet by mouth daily. Centrum Silver  . nitroGLYCERIN (NITROSTAT) 0.4 MG SL tablet Place 1 tablet (0.4 mg total) under the tongue every 5 (five) minutes as needed for chest pain.  Marland Kitchen omeprazole (PRILOSEC) 40 MG capsule Take 1 capsule (40 mg total) by mouth daily.  . pravastatin (PRAVACHOL) 40 MG tablet Take 40 mg by mouth at bedtime.   . temazepam (RESTORIL) 15 MG capsule Take 30 mg by mouth at bedtime.   . Vitamin D, Ergocalciferol, (DRISDOL) 50000 UNITS CAPS Take 50,000 Units by mouth every 14 (fourteen) days.    No facility-administered encounter medications on file as of 01/18/2020.     PHYSICAL EXAM:   Deferred  Renay Crammer Z Cathie Bonnell, NP

## 2020-01-21 ENCOUNTER — Other Ambulatory Visit: Payer: Self-pay

## 2020-01-21 ENCOUNTER — Other Ambulatory Visit: Payer: 59

## 2020-01-21 DIAGNOSIS — Z515 Encounter for palliative care: Secondary | ICD-10-CM

## 2020-01-21 NOTE — Progress Notes (Signed)
[facility] SW NOTE  PATIENT NAME: JAAZIEL PEATROSS DOB: 10/23/1956 MRN: [id number]  PRIMARY CARE PROVIDER: [name], MD  RESPONSIBLE PARTY:  Acct ID - Guarantor Home Phone Work Phone Relationship Acct Type  [id number] - Wise,[phone]  Self P/F     [address], [place], [place] [address]     PLAN OF CARE and INTERVENTIONS:             1. GOALS OF CARE/ ADVANCE CARE PLANNING:  Patient is a FULL CODE. Goal is to maintain his quality of life.  2. SOCIAL/EMOTIONAL/SPIRITUAL ASSESSMENT/ INTERVENTIONS:  SW completed TELEHEALTH visit with patient. Patient denies pain. Patient said he has had "no major breathing problems" recently. Discussed anxiety and panic attacks. Patient feels this has improved since our last conversation. Patient processed his past medical interventions and physical challenges. Patient reflected on his life prior to his medical challenges. Patient said he tries to be positive but it is hard. SW and patient continued to discuss coping skills. Patient is planning to go outside today for a short walk. SW provided emotional support, used solution-focused techniques, validated patient's feelings, used active and reflective listening. Patient appreciative of SW visit. 3. PATIENT/CAREGIVER EDUCATION/ COPING:  Patient was alert, oriented. Patient is open in expressing his feelings. Patient continues to express frustration with COVID-19. Patient said isolation has been hard and he has felt down. Patient has limited support. Patient said that he does feel supported by his pastor and church. 4. PERSONAL EMERGENCY PLAN:  Patient will call 9-1-1 for emergencies. 5. COMMUNITY RESOURCES COORDINATION/ HEALTH CARE NAVIGATION:  Patient manages his care. Patient had a visit with Dr. Patsey Berthold and felt this was a good visit. Patient goes back to see her again on 2/15. [facility] volunteer, Randall Hiss will be calling patient within the next week and patient is looking forward  to this. 6. FINANCIAL/LEGAL CONCERNS/INTERVENTIONS:  None.     SOCIAL HX:  Social History   Tobacco Use  . Smoking status: Former Smoker    Packs/day: 1.50    Years: 32.00    Pack years: 48.00    Types: Cigarettes    Quit date: 12/30/2004    Years since quitting: 15.0  . Smokeless tobacco: Never Used  . Tobacco comment: 1 ppd x 30 years  Substance Use Topics  . Alcohol use: Yes    Comment: 1- 2 beers monthly    CODE STATUS:   Code Status: Prior (FULL) ADVANCED DIRECTIVES: N MOST FORM COMPLETE:  No. HOSPICE EDUCATION PROVIDED: None.  PPS: Patient is independent of ADLs. Patient does experience weakness and SOB easily.  I spent44minutes with patient/family, from9:30-10:15aproviding education, support and consultation.  Margaretmary Lombard, LCSW

## 2020-01-26 IMAGING — DX DG CHEST 2V
2 series · 2 of 2 positions shown · non-contrast
Comparison: CT chest 05/04/2018.  Chest 05/04/2018.

CLINICAL DATA: Shortness of breath since midnight. History of
throat and lung cancer. Wheezing and stridor.

EXAM:
CHEST - 2 VIEW

[chest pa]
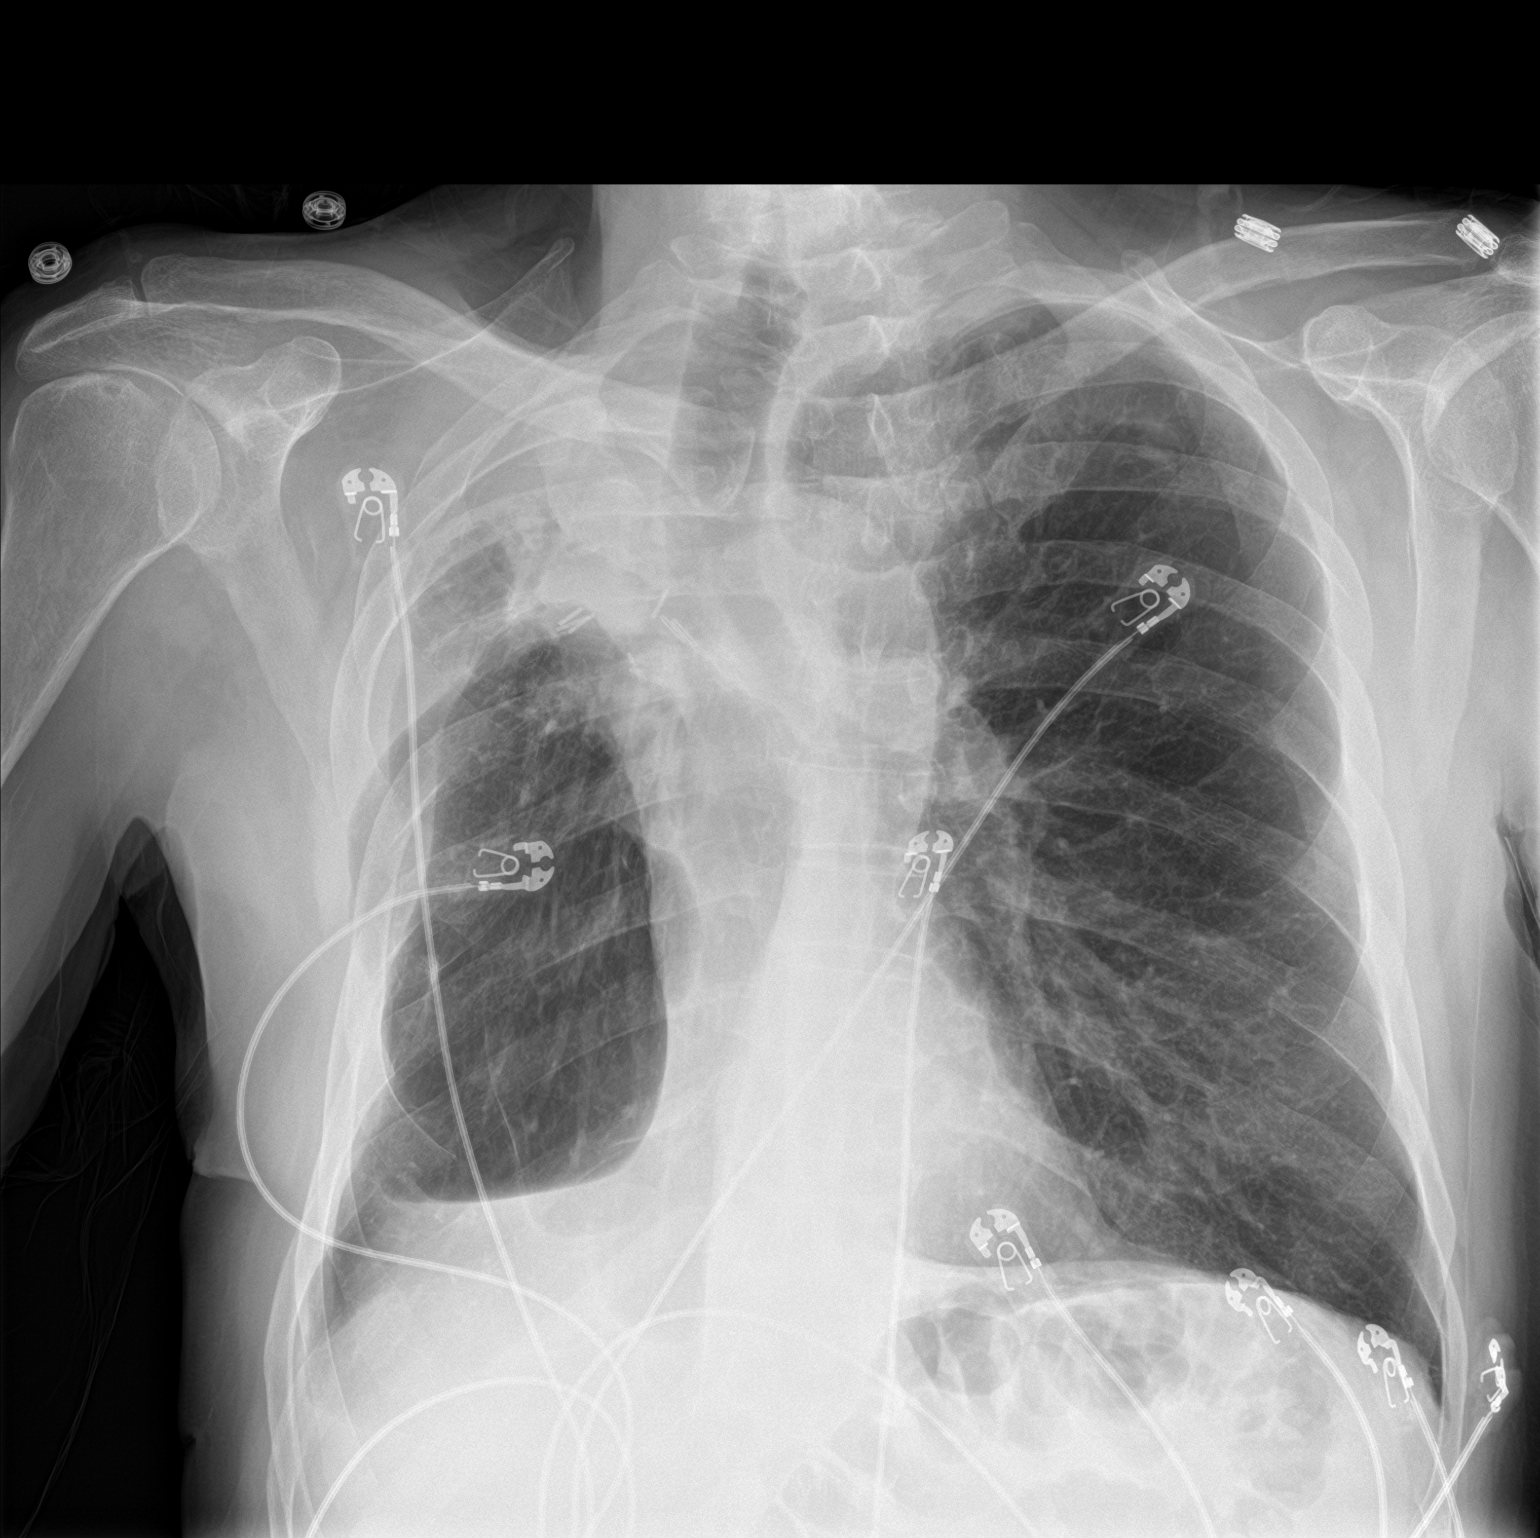

[chest lat]
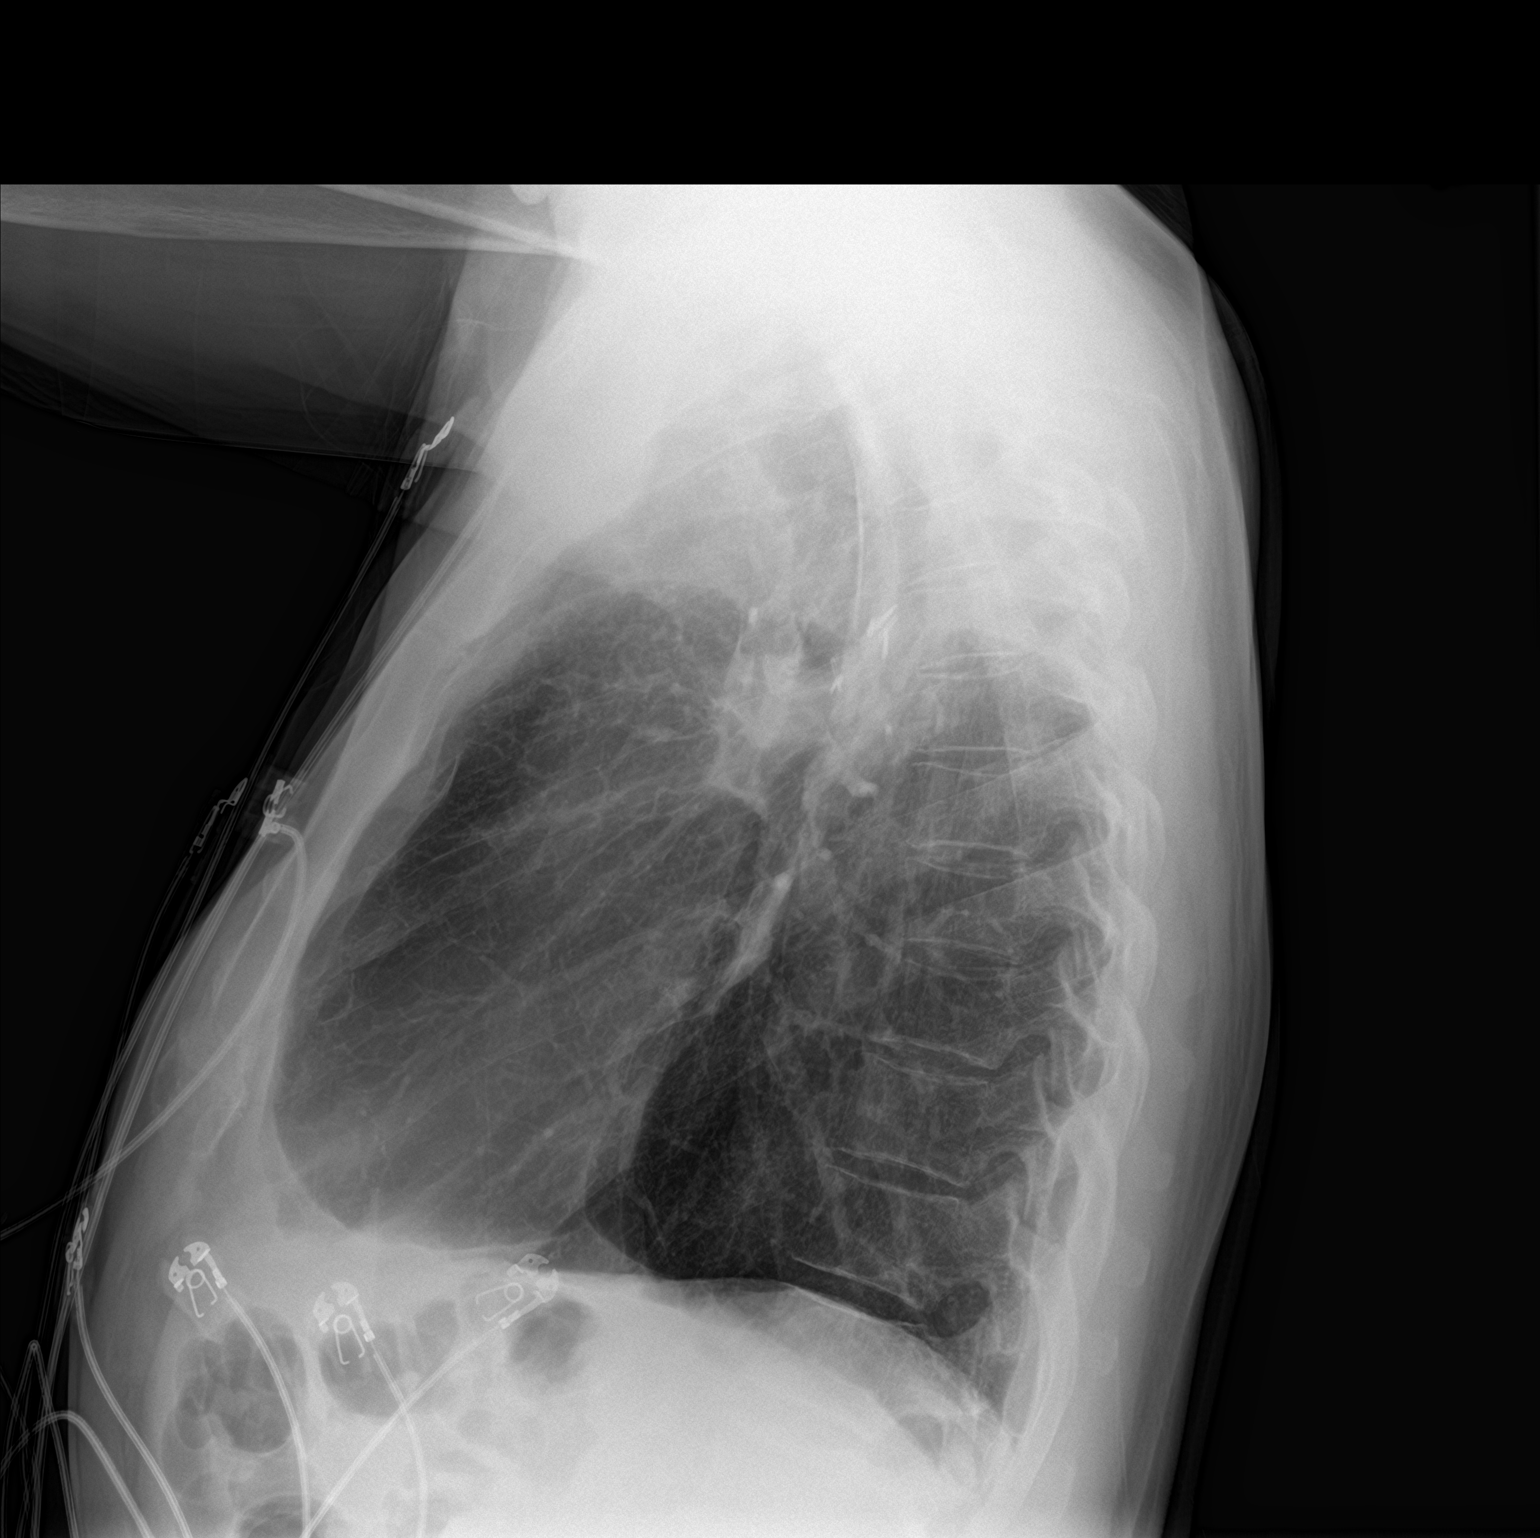

[2 of 2 positions shown; findings below may reference images not displayed]

FINDINGS: Surgical clips in the right upper lung. Volume loss and scarring in
the right upper lung likely relating to previous partial resection.
Fibrosis and scarring in the right upper lung. Emphysematous changes
in the lungs. No airspace disease or consolidation. No blunting of
costophrenic angles. No pneumothorax. No change in appearance since
previous study. Heart size and pulmonary vascularity are normal.
Right rib changes likely resulting from previous thoracotomy.
IMPRESSION: Postoperative changes with scarring and volume loss on the right
lung. Emphysematous changes in the lungs. No change since prior
study. No evidence of active pulmonary disease.

## 2020-02-14 ENCOUNTER — Ambulatory Visit: Payer: 59 | Admitting: Pulmonary Disease

## 2020-02-14 ENCOUNTER — Encounter: Payer: Self-pay | Admitting: Pulmonary Disease

## 2020-02-14 VITALS — BP 102/68 | HR 96 | Temp 98.6°F | Ht 70.0 in

## 2020-02-14 DIAGNOSIS — Z85118 Personal history of other malignant neoplasm of bronchus and lung: Secondary | ICD-10-CM

## 2020-02-14 MED ORDER — IPRATROPIUM-ALBUTEROL 0.5-2.5 (3) MG/3ML IN SOLN: 3.0000 mL | Freq: Four times a day (QID) | RESPIRATORY_TRACT | 11 refills | Status: DC | PRN

## 2020-02-14 NOTE — Progress Notes (Signed)
 Assessment & Plan:  There are no diagnoses linked to this encounter.  Patient Instructions  Take your DuoNeb (ipratropium/albuterol ) 4 times a day via nebulizer  Continue taking budesonide  (Pulmicort ) twice a day after the DuoNeb via nebulizer  Continue your harmonica exercises  We will see you in follow-up in 2 months time call sooner should any new difficulties arise  Please note: late entry documentation due to logistical difficulties during COVID-19 pandemic. This note is filed for information purposes only, and is not intended to be used for billing, nor does it represent the full scope/nature of the visit in question. Please see any associated scanned media linked to date of encounter for additional pertinent information.  Subjective:    HPI: Stephen [name] is a 64 y.o. male presenting to the pulmonology clinic on 02/14/2020 with report of: Follow-up (Pt states he has not been doing well. Pt denies any cough, wheezing, fever, or chills.)     Outpatient Encounter Medications as of 02/14/2020  Medication Sig   aspirin  EC 81 MG tablet Take 81 mg by mouth daily.   levothyroxine  (SYNTHROID , LEVOTHROID) 125 MCG tablet Take 62.5 mcg by mouth daily before breakfast.    LORazepam  (ATIVAN ) 0.5 MG tablet Take 0.5 mg by mouth every 8 (eight) hours as needed for anxiety.   Morphine Sulfate (MORPHINE CONCENTRATE) 10 mg / 0.5 ml concentrated solution Take 5 mg by mouth every 4 (four) hours as needed for severe pain or shortness of breath.   Multiple Vitamin (MULTIVITAMIN WITH MINERALS) TABS tablet Take 1 tablet by mouth daily. Centrum Silver   nitroGLYCERIN  (NITROSTAT ) 0.4 MG SL tablet Place 1 tablet (0.4 mg total) under the tongue every 5 (five) minutes as needed for chest pain.   omeprazole  (PRILOSEC) 40 MG capsule Take 1 capsule (40 mg total) by mouth daily.   pravastatin  (PRAVACHOL ) 40 MG tablet Take 40 mg by mouth at bedtime.    temazepam  (RESTORIL ) 15 MG capsule Take 30 mg by  mouth at bedtime.   Vitamin D , Ergocalciferol , (DRISDOL ) 50000 UNITS CAPS Take 50,000 Units by mouth every 14 (fourteen) days.  (Patient not taking: Reported on 10/05/2024)   [DISCONTINUED] albuterol  (PROVENTIL  HFA;VENTOLIN  HFA) 108 (90 Base) MCG/ACT inhaler Inhale 1-2 puffs into the lungs every 6 (six) hours as needed for wheezing or shortness of breath. (Patient not taking: Reported on 09/23/2024)   [DISCONTINUED] albuterol  (PROVENTIL ) (2.5 MG/3ML) 0.083% nebulizer solution Take 2.5 mg by nebulization every 6 (six) hours as needed for wheezing or shortness of breath.   [DISCONTINUED] benzonatate  (TESSALON ) 100 MG capsule Take 100 mg by mouth 3 (three) times daily as needed for cough.   [DISCONTINUED] budesonide  (PULMICORT ) 0.25 MG/2ML nebulizer solution Take 2 mLs (0.25 mg total) by nebulization 2 (two) times daily.   [DISCONTINUED] ipratropium-albuterol  (DUONEB) 0.5-2.5 (3) MG/3ML SOLN Take 3 mLs by nebulization every 6 (six) hours as needed (shortness of breath).   [DISCONTINUED] ipratropium-albuterol  (DUONEB) 0.5-2.5 (3) MG/3ML SOLN Take 3 mLs by nebulization every 6 (six) hours as needed (shortness of breath).   [DISCONTINUED] mirtazapine  (REMERON ) 15 MG tablet Take 1 tablet (15 mg total) by mouth at bedtime.   No facility-administered encounter medications on file as of 02/14/2020.      Objective:   Vitals:   02/14/20 1545  BP: 102/68  Pulse: 96  Temp: 98.6 F (37 C)  Height: 5' 10 (1.778 m)  SpO2: 98% Comment: on 2L  TempSrc: Temporal     Physical exam documentation is limited by  delayed entry of information.

## 2020-02-14 NOTE — Patient Instructions (Signed)
Take your DuoNeb (ipratropium/albuterol) 4 times a day via nebulizer  Continue taking budesonide (Pulmicort) twice a day after the DuoNeb via nebulizer  Continue your harmonica exercises  We will see you in follow-up in 2 months time call sooner should any new difficulties arise

## 2020-02-21 ENCOUNTER — Other Ambulatory Visit: Payer: Self-pay

## 2020-02-21 ENCOUNTER — Telehealth: Payer: Self-pay | Admitting: Nurse Practitioner

## 2020-02-21 ENCOUNTER — Other Ambulatory Visit: Payer: 59 | Admitting: Nurse Practitioner

## 2020-02-21 NOTE — Telephone Encounter (Signed)
I called Mr. Parke for scheduled telemedicine pc f/u visit, no answer, message left with contact information

## 2020-02-24 ENCOUNTER — Telehealth: Payer: Self-pay

## 2020-02-24 NOTE — Telephone Encounter (Signed)
SW attempted to contact patient. SW left VM with contact information.

## 2020-02-25 ENCOUNTER — Telehealth: Payer: Self-pay

## 2020-02-25 NOTE — Telephone Encounter (Signed)
SW contacted patient to check-in and discussed volunteer. Patient said he received a VM from the volunteer but has not had the energy to respond yet. SW to Occupational hygienist. Patient said he did go to lunch with his neighbor today and enjoyed this. Patient shared that he had two panic attacks yesterday. Patient said he has frequent communication with his preacher and called him yesterday during the second panic attack. Patient said he was able to call preacher and talk himself down. Patient processed his loss of ability to do things that he once could due to his medical conditions. Patient said "I can't do things fast" anymore. Patient discussed his challenge with his fear of "lossing his breath" and feels that he will have this pressure to "fight to breathe". SW encouraged patient to evaluate his thoughts and use self-soothing and his five senses to focus. SW provided emotional support, utilized CBT techniques and used active and reflective listening. SW challenged patient to keep a log of his panic attacks.

## 2020-03-23 ENCOUNTER — Other Ambulatory Visit: Payer: 59 | Admitting: Nurse Practitioner

## 2020-03-23 ENCOUNTER — Other Ambulatory Visit: Payer: Self-pay

## 2020-03-23 ENCOUNTER — Encounter: Payer: Self-pay | Admitting: Nurse Practitioner

## 2020-03-23 DIAGNOSIS — J449 Chronic obstructive pulmonary disease, unspecified: Secondary | ICD-10-CM

## 2020-03-23 DIAGNOSIS — Z515 Encounter for palliative care: Secondary | ICD-10-CM

## 2020-03-23 NOTE — Progress Notes (Signed)
[facility] Consult Note Telephone: [phone]  Fax: [phone]  PATIENT NAME: Stephen [name] DOB: 1956/06/03 MRN: [id number]  PRIMARY CARE PROVIDER:   [name], MD  REFERRING PROVIDER:  [name], MD [address] [place],   [address]    [place] [id number]  Due to the COVID-19 crisis, this visit was done via telemedicine from my office and it was initiated and consent by this patient and or family.  RECOMMENDATIONS and PLAN: 1.ACP: Full code with aggressive interventions  2.Dyspneic secondary to remain stable at present time.Continue O2, energy conservation, inhalation therapy for comfort;continue roxanol; ativan and increase nebulizers  3.Palliative care encounter; Palliative medicine team will continue to support patient, patient's family, and medical team. Visit consisted of counseling and education dealing with the complex and emotionally intense issues of symptom management and palliative care in the setting of serious and potentially life-threatening illness  I spent 45 minutes providing this consultation,  from 12:00pm to 12:45pm. More than 50% of the time in this consultation was spent coordinating communication.   HISTORY OF PRESENT ILLNESS:  Stephen [name] is a 64 y.o. 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 I called Stephen [name] for scheduled telemedicine telephonic is video not available follow-up palliative care visit. Stephen [name] and I  talked about purpose of visit. Stephen [name] and agreement. We talked about how he's feeling today. Stephen [name] endorses that he is not doing very well at times. Stephen [name] is more tired. Stephen [name] endorse is that he got choked last night and had to take two Ativan, multiple nebulizers to get relief. We talked about the option of using roxanol. Stephen. [name] continues to endorse that he is fearful of the roxanol being by himself. We talked about the purpose and function of roxanol. We talked about how it works. We talked about when do use it. We talked about his oxygen. We talked about how many nebulizers he is doing a day. Stephen. [name] endorses typically he does 2 to 4 nebulizers every day. Stephen [name] endorses that he is only able to walk 5 to 10 ft before he becomes short of breath having to sit down and rest. Stephen [name] endorses that it takes him quite a while to get dressed to to the shortness of breath and fatigue. We talked about his eating for which he does to come short of breath. Appetite has remains decline, no noted weight loss. We talked about chronic disease progression of COPD. We talked about his appointment with Dr Patsey Berthold,  Pulmonology. We talked about medical goals of care. Currently he is a full code. We talked about Hospice Services as we previously had discussed. We talked about what Hospice Services would provide under Medicare benefit. We talked about seeing if Stephen [name] is eligible by having hospice positions review case. Stephen [name] in agreement. We talked about Role palliative care and plan of care. Discuss with Stephen [name] will recontact once determined if he is Hospice eligible. If Stephen. [name] schedule follow-up palliative care visit. Stephen [name] in agreement. Questions answered to satisfaction. Therapeutic listening and emotional support provided. Contact information.  Palliative  Care was asked to help to continue to address goals of care.   CODE STATUS: Full code  PPS:  50% HOSPICE ELIGIBILITY/DIAGNOSIS: TBD  PAST MEDICAL HISTORY:  Past Medical History:  Diagnosis Date  . Allergic rhinitis, cause unspecified   . Anxiety   . Blindness of left eye   . CAD (coronary artery disease)    Coronary CTA 7/19: Calcium score 0, LAD with proximal discrete area of soft plaque;  Mid LAD FFR 0.86 - No hemodynamically significant coronary stenoses by CT FFR.  . Complication of anesthesia    difficulty awaking after colonoscopy   . Depression   . Dyspnea   . GERD (gastroesophageal reflux disease)   . Hypothyroidism   . Lung cancer ([place])    lung ca dx 06, has had chemo and radiation  . Osteoporosis   . Other emphysema ([place])    copd  . Throat cancer ([place])    throat ca dx 2007  . Thrombosed hemorrhoids   . Tubular adenoma of colon 2009  . Unspecified vitamin D deficiency     SOCIAL HX:  Social History   Tobacco Use  . Smoking status: Former Smoker    Packs/day: 1.50    Years: 32.00    Pack years: 48.00    Types: Cigarettes    Quit date: 12/30/2004    Years since quitting: 15.2  . Smokeless tobacco: Never Used  . Tobacco comment: 1 ppd x 30 years  Substance Use Topics  . Alcohol use: Yes    Comment: 1- 2 beers monthly    ALLERGIES:  Allergies  Allergen Reactions  . Codeine Other (See Comments)    Unknown reaction. Mother told him he was allergic  . Cymbalta [Duloxetine Hcl] Nausea And Vomiting  . Montelukast Sodium Other (See Comments)    Causes sinusitis     PERTINENT MEDICATIONS:  Outpatient Encounter Medications as of 03/23/2020  Medication Sig  . albuterol (PROVENTIL HFA;VENTOLIN HFA) 108 (90 Base) MCG/ACT inhaler Inhale 1-2 puffs into the lungs every 6 (six) hours as needed for wheezing or shortness of breath.  Marland Kitchen albuterol (PROVENTIL) (2.5 MG/3ML) 0.083% nebulizer solution Take 2.5 mg by nebulization every 6 (six) hours as needed for wheezing or shortness of breath.   Marland Kitchen aspirin EC 81 MG tablet Take 81 mg by mouth daily.  . benzonatate  (TESSALON) 100 MG capsule Take 100 mg by mouth 3 (three) times daily as needed for cough.  . budesonide (PULMICORT) 0.25 MG/2ML nebulizer solution Take 2 mLs (0.25 mg total) by nebulization 2 (two) times daily.  Marland Kitchen ipratropium-albuterol (DUONEB) 0.5-2.5 (3) MG/3ML SOLN Take 3 mLs by nebulization every 6 (six) hours as needed (shortness of breath).  Marland Kitchen levothyroxine (SYNTHROID, LEVOTHROID) 125 MCG tablet Take 62.5 mcg by mouth daily before breakfast.   . LORazepam (ATIVAN) 0.5 MG tablet Take 0.5 mg by mouth every 8 (eight) hours as needed for anxiety.  . mirtazapine (REMERON) 15 MG tablet Take 1 tablet (15 mg total) by mouth at bedtime.  . Morphine Sulfate (MORPHINE CONCENTRATE) 10 mg / 0.5 ml concentrated solution Take 5 mg by mouth every 4 (four) hours as needed for severe pain or shortness of breath.  . Multiple Vitamin (MULTIVITAMIN WITH MINERALS) TABS tablet Take 1 tablet by mouth daily. Centrum Silver  . nitroGLYCERIN (NITROSTAT) 0.4 MG SL tablet Place 1 tablet (0.4 mg total) under the tongue every 5 (five) minutes as needed for chest pain.  Marland Kitchen omeprazole (PRILOSEC) 40 MG capsule Take 1 capsule (  40 mg total) by mouth daily.  . pravastatin (PRAVACHOL) 40 MG tablet Take 40 mg by mouth at bedtime.   . temazepam (RESTORIL) 15 MG capsule Take 30 mg by mouth at bedtime.   . Vitamin D, Ergocalciferol, (DRISDOL) 50000 UNITS CAPS Take 50,000 Units by mouth every 14 (fourteen) days.    No facility-administered encounter medications on file as of 03/23/2020.    PHYSICAL EXAM:  Deferred  Mikalia Fessel Z Lonnetta Kniskern, NP

## 2020-03-24 ENCOUNTER — Telehealth: Payer: Self-pay | Admitting: Nurse Practitioner

## 2020-03-24 NOTE — Telephone Encounter (Signed)
I called Mr. Poitras, [facility] physician wanted to monitor and follow with Palliative care for now. I called and updated Ms. Bodin, scheduled a f/u visit. Mr. Latouche in agreement

## 2020-04-12 ENCOUNTER — Other Ambulatory Visit: Payer: Self-pay | Admitting: Pulmonary Disease

## 2020-04-13 ENCOUNTER — Telehealth: Payer: Self-pay | Admitting: Nurse Practitioner

## 2020-04-13 NOTE — Telephone Encounter (Signed)
I called Mr. Heber to update on information, Hospice Physicians wanted to watch longer with Palliative care and get Dr Duwayne Heck Pulmonology input upon her return. Message left for Mr. Stumpe including contact information

## 2020-04-13 NOTE — Telephone Encounter (Signed)
Called patient to see if we could reschedule his 4/20 Palliative f/u visit, (so she could work in a new referral), patient was in agreement to do this.  Patient stated that he was currently doing okay with no problems or concerns.  I have rescheduled the f/u visit to 05/01/20 @ 2 PM

## 2020-04-18 ENCOUNTER — Other Ambulatory Visit: Payer: 59 | Admitting: Nurse Practitioner

## 2020-04-20 ENCOUNTER — Other Ambulatory Visit: Payer: 59 | Admitting: Nurse Practitioner

## 2020-04-25 ENCOUNTER — Telehealth: Payer: Self-pay | Admitting: Nurse Practitioner

## 2020-04-25 NOTE — Telephone Encounter (Signed)
I discussed case with Dr Duwayne Heck about benefit of Hospice and eligibility. Dr Duwayne Heck agree with Hospice for Stephen [name]. Notified Dr Walker/Dr Monguiloid of case review with Dr Duwayne Heck. In agreement to admit. I called Stephen [name]. I discussed with discussion with Dr Duwayne Heck, Hospice eligiblity, what Hospice services will provide. Stephen [name] in agreement to Hospice. Will notify Dr Joylene Draft for update and Hospice order.   Total time spent 30 minutes Documentation 5 minutes Phone discussion 25 minutes

## 2020-04-28 ENCOUNTER — Encounter: Payer: Self-pay | Admitting: Pulmonary Disease

## 2020-04-28 ENCOUNTER — Ambulatory Visit: Payer: 59 | Admitting: Pulmonary Disease

## 2020-04-28 ENCOUNTER — Other Ambulatory Visit: Payer: Self-pay

## 2020-04-28 VITALS — BP 112/43 | HR 81 | Temp 98.0°F | Ht 70.0 in | Wt 186.2 lb

## 2020-04-28 DIAGNOSIS — R0602 Shortness of breath: Secondary | ICD-10-CM

## 2020-04-28 DIAGNOSIS — J449 Chronic obstructive pulmonary disease, unspecified: Secondary | ICD-10-CM

## 2020-04-28 DIAGNOSIS — K219 Gastro-esophageal reflux disease without esophagitis: Secondary | ICD-10-CM

## 2020-04-28 MED ORDER — LANSOPRAZOLE 30 MG PO TBDD: 30.0000 mg | DELAYED_RELEASE_TABLET | Freq: Every day | ORAL | 6 refills | Status: DC

## 2020-04-28 MED ORDER — LANSOPRAZOLE 15 MG PO TBDD: 30.0000 mg | DELAYED_RELEASE_TABLET | Freq: Every day | ORAL | Status: DC

## 2020-04-28 NOTE — Patient Instructions (Addendum)
Recommend to start something for your reflux that we will give you twice a day.  I think reflux is either triggering or contributing to your episodes of shortness of breath. Prevacid solutab 30 mg daily.   I would like to see you back in 4 to 6 weeks time.

## 2020-04-28 NOTE — Progress Notes (Signed)
 Assessment & Plan:  1. Shortness of breath (Primary)  2. COPD, severe (HCC)  3. Gastroesophageal reflux disease, unspecified whether esophagitis present   Patient Instructions  Recommend to start something for your reflux that we will give you twice a day.  I think reflux is either triggering or contributing to your episodes of shortness of breath. Prevacid  solutab 30 mg daily.   I would like to see you back in 4 to 6 weeks time.  Please note: late entry documentation due to logistical difficulties during COVID-19 pandemic. This note is filed for information purposes only, and is not intended to be used for billing, nor does it represent the full scope/nature of the visit in question. Please see any associated scanned media linked to date of encounter for additional pertinent information.  Subjective:    HPI: Stephen [name] is a 64 y.o. male presenting to the pulmonology clinic on 04/28/2020 with report of: Follow-up (SOB with activity, 'panic attacks' after activity )     Outpatient Encounter Medications as of 04/28/2020  Medication Sig   aspirin  EC 81 MG tablet Take 81 mg by mouth daily.   levothyroxine  (SYNTHROID , LEVOTHROID) 125 MCG tablet Take 62.5 mcg by mouth daily before breakfast.    LORazepam  (ATIVAN ) 0.5 MG tablet Take 0.5 mg by mouth every 8 (eight) hours as needed for anxiety.   Morphine Sulfate (MORPHINE CONCENTRATE) 10 mg / 0.5 ml concentrated solution Take 5 mg by mouth every 4 (four) hours as needed for severe pain or shortness of breath.   Multiple Vitamin (MULTIVITAMIN WITH MINERALS) TABS tablet Take 1 tablet by mouth daily. Centrum Silver   nitroGLYCERIN  (NITROSTAT ) 0.4 MG SL tablet Place 1 tablet (0.4 mg total) under the tongue every 5 (five) minutes as needed for chest pain.   pravastatin  (PRAVACHOL ) 40 MG tablet Take 40 mg by mouth at bedtime.    temazepam  (RESTORIL ) 15 MG capsule Take 30 mg by mouth at bedtime.   Vitamin D , Ergocalciferol , (DRISDOL )  50000 UNITS CAPS Take 50,000 Units by mouth every 14 (fourteen) days.  (Patient not taking: Reported on 10/05/2024)   [DISCONTINUED] albuterol  (PROVENTIL ) (2.5 MG/3ML) 0.083% nebulizer solution Take 2.5 mg by nebulization every 6 (six) hours as needed for wheezing or shortness of breath.   [DISCONTINUED] benzonatate  (TESSALON ) 100 MG capsule Take 100 mg by mouth 3 (three) times daily as needed for cough.   [DISCONTINUED] budesonide  (PULMICORT ) 0.25 MG/2ML nebulizer solution USE 1 VIAL VIA NEBULIZER TWICE DAILY   [DISCONTINUED] ipratropium-albuterol  (DUONEB) 0.5-2.5 (3) MG/3ML SOLN Take 3 mLs by nebulization every 6 (six) hours as needed (shortness of breath).   [DISCONTINUED] mirtazapine  (REMERON ) 15 MG tablet Take 1 tablet (15 mg total) by mouth at bedtime.   omeprazole  (PRILOSEC) 40 MG capsule Take 1 capsule (40 mg total) by mouth daily.   [DISCONTINUED] albuterol  (PROVENTIL  HFA;VENTOLIN  HFA) 108 (90 Base) MCG/ACT inhaler Inhale 1-2 puffs into the lungs every 6 (six) hours as needed for wheezing or shortness of breath. (Patient not taking: Reported on 09/23/2024)   [DISCONTINUED] lansoprazole  (PREVACID  SOLUTAB) 30 MG disintegrating tablet Take 1 tablet (30 mg total) by mouth daily at 12 noon.   [DISCONTINUED] lansoprazole  (PREVACID  SOLUTAB) disintegrating tablet 30 mg    No facility-administered encounter medications on file as of 04/28/2020.      Objective:   Vitals:   04/28/20 1011  BP: (!) 112/43  Pulse: 81  Temp: 98 F (36.7 C)  Height: 5' 10 (1.778 m)  Weight: 186 lb  3.2 oz (84.5 kg)  SpO2: 99%  TempSrc: Oral  BMI (Calculated): 26.72     Physical exam documentation is limited by delayed entry of information.

## 2020-05-01 ENCOUNTER — Other Ambulatory Visit: Payer: 59 | Admitting: Nurse Practitioner

## 2020-05-02 ENCOUNTER — Telehealth: Payer: Self-pay | Admitting: Pulmonary Disease

## 2020-05-02 MED ORDER — LANSOPRAZOLE 30 MG PO CPDR: 30.0000 mg | DELAYED_RELEASE_CAPSULE | Freq: Every day | ORAL | 1 refills | Status: DC

## 2020-05-02 NOTE — Telephone Encounter (Signed)
ATC patient left message letting patient know that new RX has been sent into pharmacy. Nothing further needed at this time.

## 2020-06-12 ENCOUNTER — Other Ambulatory Visit: Payer: Self-pay

## 2020-06-12 ENCOUNTER — Ambulatory Visit: Payer: 59 | Admitting: Pulmonary Disease

## 2020-06-12 ENCOUNTER — Encounter: Payer: Self-pay | Admitting: Primary Care

## 2020-06-12 VITALS — BP 120/76 | HR 86 | Temp 97.7°F | Ht 70.0 in | Wt 187.6 lb

## 2020-06-12 DIAGNOSIS — R0602 Shortness of breath: Secondary | ICD-10-CM

## 2020-06-12 NOTE — Patient Instructions (Signed)
Follow up 3 months

## 2020-07-02 ENCOUNTER — Other Ambulatory Visit: Payer: Self-pay | Admitting: Pulmonary Disease

## 2020-07-10 ENCOUNTER — Encounter: Payer: 59 | Admitting: Gastroenterology

## 2020-07-10 ENCOUNTER — Other Ambulatory Visit: Payer: Self-pay

## 2020-07-10 MED ORDER — LANSOPRAZOLE 30 MG PO CPDR: 30.0000 mg | DELAYED_RELEASE_CAPSULE | Freq: Every day | ORAL | 1 refills | Status: AC

## 2020-08-01 ENCOUNTER — Ambulatory Visit: Payer: 59 | Admitting: Gastroenterology

## 2020-08-01 ENCOUNTER — Encounter: Payer: Self-pay | Admitting: Gastroenterology

## 2020-08-01 VITALS — BP 114/74 | HR 84 | Ht 69.0 in | Wt 186.5 lb

## 2020-08-01 DIAGNOSIS — Z86010 Personal history of colonic polyps: Secondary | ICD-10-CM

## 2020-08-01 DIAGNOSIS — K5900 Constipation, unspecified: Secondary | ICD-10-CM | POA: Diagnosis not present

## 2020-08-01 DIAGNOSIS — K219 Gastro-esophageal reflux disease without esophagitis: Secondary | ICD-10-CM | POA: Diagnosis not present

## 2020-08-01 NOTE — Patient Instructions (Signed)
You will be due for a recall colonoscopy in 04/2022. We will send you a reminder in the mail when it gets closer to that time.  Thank you for choosing me and [facility].  Pricilla Riffle. Dagoberto Ligas., MD., Marval Regal

## 2020-08-01 NOTE — Progress Notes (Signed)
    History of Present Illness: This is a 64 year old male with GERD, constipation and a history of adenomatous colon polyps.  He has a history of squamous cell lung cancer status post lobectomy, squamous cell cancer of glottis status post resection, COPD on 2L O2, CAD, TIA, alpha-1 antitrypsin deficiency carrier. He note frequent dyspnea.  He has mild chronic constipation.  Several symptoms consistent with GERD which are controlled on current medications.  He presents today as a 5-year interval following his last colonoscopy.  His most recent colonoscopy as below.  Prior colonoscopy was in June 2009 and had one small tubular adenoma.   Colonoscopy 04/2015 1. Sessile polyp in the transverse colon; polypectomy performed with cold forceps 2. Sessile polyp in the sigmoid colon; polypectomy performed with a cold snare 3. Moderate diverticulosis in the descending colon and sigmoid colon 4. Mild diverticulosis in the transverse colon, ascending colon, and at the cecum 5. Grade l internal hemorrhoids Path: one adenoma, one hyperplastic  Current Medications, Allergies, Past Medical History, Past Surgical History, Family History and Social History were reviewed in [employer] record.   Physical Exam: General: Well developed, well nourished, no acute distress Head: Normocephalic and atraumatic Eyes:  sclerae anicteric, EOMI Ears: Normal auditory acuity Mouth: Not examined, mask on during Covid-19 pandemic Lungs: Clear throughout to auscultation Heart: Regular rate and rhythm; no murmurs, rubs or bruits Abdomen: Soft, non tender and non distended. No masses, hepatosplenomegaly or hernias noted. Normal Bowel sounds Rectal: Not done Musculoskeletal: Symmetrical with no gross deformities  Pulses:  Normal pulses noted Extremities: No clubbing, cyanosis, edema or deformities noted Neurological: Alert oriented x 4, grossly nonfocal Psychological:  Alert and cooperative. Normal mood  and affect   Assessment and Recommendations:  1. Personal history of adenomatous colon polyps.  He has had one small tubular adenoma on both of his colonoscopies.  The previous guidelines called for a 5-year surveillance colonoscopy interval and the current guidelines call for a 7-year interval for surveillance.  Change his colonoscopy surveillance date to May 2023.  At that time we will need to reassess appropriateness of surveillance colonoscopy.  He is at high risk for cardiopulmonary complications with sedation and colonoscopy.  2. GERD.  Continue pantoprazole 40 mg daily and follow standard antireflux measures.  3. Constipation.  Continue Senokot 1-2 tablets daily, every other day or every third day titrated for adequate bowel movements.

## 2020-10-10 ENCOUNTER — Encounter: Payer: Self-pay | Admitting: Pulmonary Disease

## 2020-10-10 ENCOUNTER — Other Ambulatory Visit: Payer: Self-pay

## 2020-10-10 ENCOUNTER — Ambulatory Visit: Payer: 59 | Admitting: Pulmonary Disease

## 2020-10-10 VITALS — BP 106/64 | HR 75 | Temp 97.1°F | Ht 69.0 in | Wt 182.6 lb

## 2020-10-10 DIAGNOSIS — J386 Stenosis of larynx: Secondary | ICD-10-CM | POA: Diagnosis not present

## 2020-10-10 DIAGNOSIS — J449 Chronic obstructive pulmonary disease, unspecified: Secondary | ICD-10-CM | POA: Diagnosis not present

## 2020-10-10 DIAGNOSIS — R0602 Shortness of breath: Secondary | ICD-10-CM | POA: Diagnosis not present

## 2020-10-10 DIAGNOSIS — Z8521 Personal history of malignant neoplasm of larynx: Secondary | ICD-10-CM | POA: Diagnosis not present

## 2020-10-10 DIAGNOSIS — Z87891 Personal history of nicotine dependence: Secondary | ICD-10-CM

## 2020-10-10 NOTE — Progress Notes (Signed)
Subjective:    Patient ID: Stephen [name], male    DOB: 1956/01/15, 64 y.o.   MRN: [id number]  HPI Stephen [name] is a 64 year old former smoker with a very complex medical history that includes lung cancer and head and neck cancer previously.  He has severe COPD by last PFTs of 2017.  He has known MZ genotype for alpha-1 being a carrier.  He presents today for follow-up on COPD.  From the COPD standpoint he is doing well he still gets into "spasms" where he cannot breathe.  These "spasms" are increased stridor.  This occurs when he is rushing or doing something that requires more exertion.  His upper airway has been narrowed due to prior surgery for head and neck cancer and radiation therapy.  We have discussed in the past that tracheostomy may bypass these issues and help him with his breathing but he does not want to entertain having a tracheostomy again.  He is using nebulization treatments as he feels that these help his breathing better than inhalers, this is not surprising given his severe COPD and issues with his upper airway.  He has not had any increase of cough, sputum production, he has not had any wheezing.  He notes that his nebulizers appear to keep him well controlled.  Looking at his reports from the most recent follow-up at [facility] his upper airway is about 50% of normal from his vertical partial laryngectomy in 2019.  There is also increased scar tissue.  During that visit at [facility] it was recommended that he have tracheostomy which would allow for improved breathing and restoration of some quality of life but again he continues to decline this.  Recent evaluation at [facility] was 13 June 2020 where the same recommendation was offered.  Review of Systems A 10 point review of systems was performed and it is as noted above otherwise negative.  Allergies  Allergen Reactions  . Singulair [Montelukast Sodium]   . Codeine Other (See Comments)    Unknown reaction. Mother told him  he was allergic  . Cymbalta [Duloxetine Hcl] Nausea And Vomiting  . Montelukast Sodium Other (See Comments)    Causes sinusitis   Current Meds  Medication Sig  . albuterol (PROVENTIL HFA;VENTOLIN HFA) 108 (90 Base) MCG/ACT inhaler Inhale 1-2 puffs into the lungs every 6 (six) hours as needed for wheezing or shortness of breath.  Marland Kitchen albuterol (PROVENTIL) (2.5 MG/3ML) 0.083% nebulizer solution Take 2.5 mg by nebulization every 6 (six) hours as needed for wheezing or shortness of breath.   . ARIPiprazole (ABILIFY) 5 MG tablet Take 5 mg by mouth daily.  Marland Kitchen aspirin EC 81 MG tablet Take 81 mg by mouth daily.  . budesonide (PULMICORT) 0.25 MG/2ML nebulizer solution USE 1 VIAL VIA NEBULIZER TWICE DAILY  . ipratropium-albuterol (DUONEB) 0.5-2.5 (3) MG/3ML SOLN Take 3 mLs by nebulization every 6 (six) hours as needed (shortness of breath).  Marland Kitchen levothyroxine (SYNTHROID, LEVOTHROID) 125 MCG tablet Take 62.5 mcg by mouth daily before breakfast.   . LORazepam (ATIVAN) 0.5 MG tablet Take 0.5 mg by mouth every 8 (eight) hours as needed for anxiety.  . mirtazapine (REMERON) 30 MG tablet Take 30 mg by mouth at bedtime.  . Morphine Sulfate (MORPHINE CONCENTRATE) 10 mg / 0.5 ml concentrated solution Take 5 mg by mouth every 4 (four) hours as needed for severe pain or shortness of breath.  Marland Kitchen omeprazole (PRILOSEC) 40 MG capsule Take 1 capsule (40 mg total) by mouth daily.  Marland Kitchen  OXYGEN Inhale 2 L/min into the lungs continuous.  . pravastatin (PRAVACHOL) 40 MG tablet Take 40 mg by mouth at bedtime.   . temazepam (RESTORIL) 15 MG capsule Take 30 mg by mouth at bedtime.   . Vitamin D, Ergocalciferol, (DRISDOL) 50000 UNITS CAPS Take 50,000 Units by mouth every 14 (fourteen) days.    Immunization History  Administered Date(s) Administered  . Influenza Inj Mdck Quad Pf 09/23/2019  . Influenza Split 08/30/2013, 09/08/2014, 09/06/2015  . Influenza Whole 10/01/2010, 10/02/2011, 09/29/2016  . Influenza,inj,Quad PF,6+ Mos  09/15/2018  . Influenza-Unspecified 09/10/2020  . Moderna SARS-COVID-2 Vaccination 04/07/2020, 04/13/2020  . Pneumococcal Conjugate-13 09/08/2014  . Pneumococcal Polysaccharide-23 10/30/2009  . Zoster 09/09/2016   Social History   Tobacco Use  . Smoking status: Former Smoker    Packs/day: 1.50    Years: 32.00    Pack years: 48.00    Types: Cigarettes    Quit date: 12/30/2004    Years since quitting: 15.8  . Smokeless tobacco: Never Used  . Tobacco comment: 1 ppd x 30 years  Substance Use Topics  . Alcohol use: Yes    Comment: 1- 2 beers monthly        Objective:   Physical Exam BP 106/64 (BP Location: Left Arm, Patient Position: Sitting, Cuff Size: Normal)   Pulse 75   Temp (!) 97.1 F (36.2 C) (Temporal)   Ht 5\' 9"  (1.753 m)   Wt 182 lb 9.6 oz (82.8 kg)   SpO2 98%   BMI 26.97 kg/m  GENERAL: Chronically ill-appearing man, well-nourished, no acute distress.  Raspy voice.   HEAD: Normocephalic, atraumatic.  EYES: Pupils equal, round, reactive to light.  No scleral icterus.  MOUTH: Nose/mouth/throat not examined due to masking requirements for COVID 19. NECK: Supple. No thyromegaly.  Prior tracheostomy scar. No JVD.  No adenopathy. PULMONARY: Good air entry bilaterally.  No adventitious sounds. CARDIOVASCULAR: S1 and S2. Regular rate and rhythm.  No rubs, murmurs or gallops heard. ABDOMEN: Benign. MUSCULOSKELETAL: No joint deformity, no clubbing, no edema.  NEUROLOGIC: No focal deficit.  Speech is fluent SKIN: Intact,warm,dry. PSYCH: Mood and behavior appropriate.     Assessment & Plan:     ICD-10-CM   1. COPD, severe ([place])  J44.9    Well-controlled COPD wise Continue nebulization treatments   2. 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 Hx of laryngeal cancer  Z85.21    Status post vertical partial laryngectomy Upper airway at 50% patency  5. Former heavy cigarette smoker (20-39 per day)  Z87.891    Remains abstinent of cigarette use   Discussion:  Overall, Jairo is doing well from the COPD standpoint.  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  He would benefit from tracheostomy however he continues to be reluctant to commit.  Continue nebulization treatments as he is doing.  Overall today he looks well.  We will see him in follow-up in 3 months time he is to contact us prior to that time should any new difficulties arise.  Renold Don, MD [facility]   *This note was dictated using voice recognition software/Dragon.  Despite best efforts to proofread, errors can occur which can change the meaning.  Any change was purely unintentional.

## 2020-10-10 NOTE — Patient Instructions (Signed)
You are doing well today.  Continue nebulizers as you are doing.   We will see you in follow-up in 3 months time call sooner should any new problems arise.

## 2020-10-20 ENCOUNTER — Encounter: Payer: Self-pay | Admitting: Pulmonary Disease

## 2020-10-20 DIAGNOSIS — R1313 Dysphagia, pharyngeal phase: Secondary | ICD-10-CM | POA: Insufficient documentation

## 2021-01-05 ENCOUNTER — Other Ambulatory Visit (HOSPITAL_COMMUNITY): Payer: Self-pay | Admitting: Radiology

## 2021-01-05 DIAGNOSIS — J449 Chronic obstructive pulmonary disease, unspecified: Secondary | ICD-10-CM

## 2021-01-17 ENCOUNTER — Other Ambulatory Visit: Payer: Self-pay

## 2021-01-17 ENCOUNTER — Ambulatory Visit: Payer: 59 | Admitting: Pulmonary Disease

## 2021-01-17 VITALS — BP 120/76 | HR 74 | Temp 97.3°F | Ht 69.0 in | Wt 184.0 lb

## 2021-01-17 DIAGNOSIS — Z8521 Personal history of malignant neoplasm of larynx: Secondary | ICD-10-CM

## 2021-01-17 DIAGNOSIS — J386 Stenosis of larynx: Secondary | ICD-10-CM

## 2021-01-17 DIAGNOSIS — J441 Chronic obstructive pulmonary disease with (acute) exacerbation: Secondary | ICD-10-CM | POA: Diagnosis not present

## 2021-01-17 DIAGNOSIS — J449 Chronic obstructive pulmonary disease, unspecified: Secondary | ICD-10-CM | POA: Diagnosis not present

## 2021-01-17 DIAGNOSIS — Z87891 Personal history of nicotine dependence: Secondary | ICD-10-CM

## 2021-01-17 MED ORDER — AMOXICILLIN-POT CLAVULANATE 875-125 MG PO TABS: 1.0000 | ORAL_TABLET | Freq: Two times a day (BID) | ORAL | 0 refills | Status: AC

## 2021-01-17 MED ORDER — METHYLPREDNISOLONE ACETATE 80 MG/ML IJ SUSP
80.0000 mg | Freq: Once | INTRAMUSCULAR | Status: AC
  Administered 2021-01-17: 80 mg via INTRAMUSCULAR

## 2021-01-17 NOTE — Progress Notes (Signed)
Subjective:    Patient ID: Stephen [name], male    DOB: Jan 11, 1956, 65 y.o.   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  He presents for follow-up on issues with dyspnea.  He has a history of COPD GOLD class III.  Alpha-1 phenotype MZ prior right lung lobectomy for lung cancer in 2006.  His current respiratory issues are mostly due to significant glottic stenosis however the patient does not want to undergo tracheostomy which would likely improve his respiratory status.  He states that when he gets short of breath he "panics" and this makes his shortness of breath worse.  This is likely due to his significant glottic stenosis.  Laryngoscopy performed by Dr. Redmond Baseman at [facility] on 13 June 2020, it showed 50% narrowing of his glottic opening due to his prior surgery.  ENT has recommended tracheostomy but he has been reluctant to commit.  He continues to complain that he cannot do anything.  I have advised him that though his lung disease is moderate to severe he may find improvement of his respiratory status with tracheostomy.  He does not wish to have this.  He is now considering hospice care.  He says he  is to have PFTs tomorrow, these were not ordered by [place].  He is currently on albuterol and Pulmicort via nebulization treatments due to inability to breath-hold for metered-dose inhalers or MDIs.  Over the last 2 to 3 days he has had increasing cough with purulent sputum production creased wheezing.  No fevers, chills or sweats, increasing dyspnea due to increased purulent sputum.  Review of Systems A 10 point review of systems was performed and it is as noted above otherwise negative.  Patient Active Problem List   Diagnosis Date Noted  . Pharyngeal dysphagia 10/20/2020  . Shortness of breath  07/27/2019  . Palliative care encounter 06/28/2019  . Laryngeal stenosis 07/16/2018  . Healthcare-associated pneumonia 07/10/2018  . HCAP (healthcare-associated pneumonia) 07/10/2018  . Protein-calorie malnutrition, severe 06/25/2018  . Acute exacerbation of chronic obstructive pulmonary disease (COPD) ([place]) 06/24/2018  . Esophageal dysmotility 06/24/2018  . GERD (gastroesophageal reflux disease) 06/24/2018  . History of lung cancer and throat cancer 06/24/2018  . FTT (failure to thrive) in adult 06/24/2018  . Abnormal echocardiogram 06/24/2018  . Hyperlipidemia 05/22/2018  . CAD (coronary artery disease)   . COPD with acute exacerbation ([place]) 05/05/2018  . Elevated troponin 05/04/2018  . Acute respiratory failure with hypoxia ([place]) 05/04/2018  . Hypothyroid 01/28/2018  . Malignant neoplasm of glottis ([place]) 01/13/2018  . Dysphonia 12/12/2017  . Alpha-1-antitrypsin deficiency carrier 07/17/2016  . Eosinophilia 05/30/2016  . History of seasonal allergies 05/30/2016  . Stopped smoking with greater than 40 pack year history 05/30/2016  . History of lobectomy of lung 05/30/2016  . COLD (chronic obstructive lung disease) ([place]) 07/27/2015  . History of elevated PSA 07/27/2015  . COPD exacerbation ([place]) 01/23/2015  . COPD, severe ([place]) 01/12/2015  . Research study patient 10/07/2014  . Need for prophylactic vaccination against Streptococcus pneumoniae (pneumococcus) 09/15/2014  . Chest pain 08/09/2013  . History of TIA (transient ischemic attack) 08/09/2013  . Otitis media of left ear 08/09/2013  . Lung cancer ([place]) 09/16/2012  . Hemorrhoids, external, thrombosed 08/26/2011  . BACK PAIN 02/22/2011  . COPD (chronic obstructive pulmonary disease) ([place]) 02/06/2011  .  ROTATOR CUFF TEAR 02/15/2010  . ALLERGIC RHINITIS 04/15/2008   Allergies  Allergen Reactions  . Singulair [Montelukast Sodium]   . Codeine Other (See Comments)    Unknown reaction. Mother told him he was allergic  .  Cymbalta [Duloxetine Hcl] Nausea And Vomiting  . Montelukast Sodium Other (See Comments)    Causes sinusitis   Current Meds  Medication Sig  . albuterol (PROVENTIL HFA;VENTOLIN HFA) 108 (90 Base) MCG/ACT inhaler Inhale 1-2 puffs into the lungs every 6 (six) hours as needed for wheezing or shortness of breath.  Marland Kitchen albuterol (PROVENTIL) (2.5 MG/3ML) 0.083% nebulizer solution Take 2.5 mg by nebulization every 6 (six) hours as needed for wheezing or shortness of breath.  . ARIPiprazole (ABILIFY) 5 MG tablet Take 5 mg by mouth daily.  Marland Kitchen aspirin EC 81 MG tablet Take 81 mg by mouth daily.  . benzonatate (TESSALON) 100 MG capsule Take 100 mg by mouth 3 (three) times daily as needed for cough.  . budesonide (PULMICORT) 0.25 MG/2ML nebulizer solution USE 1 VIAL VIA NEBULIZER TWICE DAILY  . ipratropium-albuterol (DUONEB) 0.5-2.5 (3) MG/3ML SOLN Take 3 mLs by nebulization every 6 (six) hours as needed (shortness of breath).  . lansoprazole (PREVACID) 30 MG capsule Take 1 capsule (30 mg total) by mouth daily.  Marland Kitchen levothyroxine (SYNTHROID, LEVOTHROID) 125 MCG tablet Take 62.5 mcg by mouth daily before breakfast.   . LORazepam (ATIVAN) 0.5 MG tablet Take 0.5 mg by mouth every 8 (eight) hours as needed for anxiety.  . mirtazapine (REMERON) 30 MG tablet Take 30 mg by mouth at bedtime.  . Morphine Sulfate (MORPHINE CONCENTRATE) 10 mg / 0.5 ml concentrated solution Take 5 mg by mouth every 4 (four) hours as needed for severe pain or shortness of breath.  . Multiple Vitamin (MULTIVITAMIN WITH MINERALS) TABS tablet Take 1 tablet by mouth daily. Centrum Silver  . nitroGLYCERIN (NITROSTAT) 0.4 MG SL tablet Place 1 tablet (0.4 mg total) under the tongue every 5 (five) minutes as needed for chest pain.  Marland Kitchen omeprazole (PRILOSEC) 40 MG capsule Take 1 capsule (40 mg total) by mouth daily.  . OXYGEN Inhale 2 L/min into the lungs continuous.  . pravastatin (PRAVACHOL) 40 MG tablet Take 40 mg by mouth at bedtime.   . senna  (SENOKOT) 8.6 MG TABS tablet Take 2 tablets by mouth as needed.  . temazepam (RESTORIL) 15 MG capsule Take 30 mg by mouth at bedtime.  . Vitamin D, Ergocalciferol, (DRISDOL) 50000 UNITS CAPS Take 50,000 Units by mouth every 14 (fourteen) days.    Immunization History  Administered Date(s) Administered  . Influenza Inj Mdck Quad Pf 09/23/2019  . Influenza Split 08/30/2013, 09/08/2014, 09/06/2015  . Influenza Whole 10/01/2010, 10/02/2011, 09/29/2016  . Influenza,inj,Quad PF,6+ Mos 09/15/2018  . Influenza-Unspecified 09/10/2020  . Moderna Sars-Covid-2 Vaccination 04/07/2020, 04/13/2020  . Pneumococcal Conjugate-13 09/08/2014  . Pneumococcal Polysaccharide-23 10/30/2009  . Zoster 09/09/2016   Social History   Tobacco Use  . Smoking status: Former Smoker    Packs/day: 1.50    Years: 32.00    Pack years: 48.00    Types: Cigarettes    Quit date: 12/30/2004    Years since quitting: 16.0  . Smokeless tobacco: Never Used  . Tobacco comment: 1 ppd x 30 years  Substance Use Topics  . Alcohol use: Yes    Comment: 1- 2 beers monthly       Objective:   Physical Exam BP 120/76 (BP Location: Left Arm, Cuff Size: Normal)   Pulse 74  Temp (!) 97.3 F (36.3 C) (Temporal)   Ht 5\' 9"  (1.753 m)   Wt 184 lb (83.5 kg)   SpO2 99%   BMI 27.17 kg/m  GENERAL: Chronically ill-appearing man, well-nourished, no acute distress, chronic use of accessories of respiration.  Raspy voice.   HEAD: Normocephalic, atraumatic.  EYES: Pupils equal, round, reactive to light.  No scleral icterus.  MOUTH: Nose/mouth/throat not examined due to masking requirements for COVID 19. NECK: Supple. No thyromegaly.  Prior tracheostomy scar. No JVD.  No adenopathy. PULMONARY: Good air entry bilaterally.  Diffuse rhonchi, rare end expiratory wheezes. CARDIOVASCULAR: S1 and S2. Regular rate and rhythm.  No rubs, murmurs or gallops heard. ABDOMEN: Benign. MUSCULOSKELETAL: No joint deformity, no clubbing, no edema.  On  limited exam no rashes NEUROLOGIC: No overt focal deficit. SKIN: Intact,warm,dry. PSYCH: Mood depressed and behavior appropriate.     Assessment & Plan:     ICD-10-CM   1. COPD exacerbation (HCC)  J44.1 methylPREDNISolone acetate (DEPO-MEDROL) injection 80 mg   Treat with Depo-Medrol 80 mg IM x1 Augmentin 875 twice a day x10 days  2. COPD, severe ([place])  J44.9    He is to have PFTs tomorrow I suspect he has progressed to severe COPD GOLD class III  3. Glottic stenosis  J38.6    This issue adds complexity to his management 50% glottic stenosis post partial laryngectomy 2019 Patient declines tracheostomy  4. Hx of laryngeal cancer  Z85.21    Initially noted 2007, treated nonsurgically Recurrence 2019 with partial laryngectomy Residual glottic stenosis  5. Former heavy cigarette smoker (20-39 per day)  Z87.891    This issue adds complexity to his management No evidence of relapse    Meds ordered this encounter  Medications  . amoxicillin-clavulanate (AUGMENTIN) 875-125 MG tablet    Sig: Take 1 tablet by mouth 2 (two) times daily for 10 days.    Dispense:  20 tablet    Refill:  0  . methylPREDNISolone acetate (DEPO-MEDROL) injection 80 mg   Discussion:  Patient is GOLD class III COPD, prior MZ (carrier) phenotype of alpha-1, with previous lung cancer resected in 2006 and laryngeal cancer in 2007 with recurrence 2019.  He underwent partial laryngectomy in 2019 which resulted in 50% glottic stenosis.  Most of his dyspnea issues are related to this glottic stenosis.  Though he has advanced COPD glottic stenosis is likely causing worsening symptoms.  He does not want to commit to a tracheostomy which would likely improve his breathing but he feels would decrease his quality of life.  He is currently on medications via nebulization due to inability to breath-hold and properly perform MDI/DPI maneuvers. He does have a mild COPD exacerbation today and will be treated as noted above.  In 3  months time he is to call sooner should any new difficulties arise.  Renold Don, MD [place] [facility]   *This note was dictated using voice recognition software/Dragon.  Despite best efforts to proofread, errors can occur which can change the meaning.  Any change was purely unintentional.

## 2021-01-17 NOTE — Patient Instructions (Signed)
We gave you a shot of Depo-Medrol today  We have called in a prescription of Augmentin for you  We will see him in follow-up in 3 months time call sooner should any new difficulties arise

## 2021-01-18 ENCOUNTER — Ambulatory Visit (INDEPENDENT_AMBULATORY_CARE_PROVIDER_SITE_OTHER): Payer: 59 | Admitting: Internal Medicine

## 2021-01-18 DIAGNOSIS — J449 Chronic obstructive pulmonary disease, unspecified: Secondary | ICD-10-CM | POA: Diagnosis not present

## 2021-01-18 LAB — PULMONARY FUNCTION TEST
DL/VA % pred: 77 %
DL/VA: 3.2 ml/min/mmHg/L
DLCO cor % pred: 50 %
DLCO cor: 13.51 ml/min/mmHg
DLCO unc % pred: 50 %
DLCO unc: 13.51 ml/min/mmHg
FEF 25-75 Post: 0.8 L/sec
FEF 25-75 Pre: 0.69 L/sec
FEF2575-%Change-Post: 15 %
FEF2575-%Pred-Post: 28 %
FEF2575-%Pred-Pre: 24 %
FEV1-%Change-Post: 12 %
FEV1-%Pred-Post: 52 %
FEV1-%Pred-Pre: 46 %
FEV1-Post: 1.81 L
FEV1-Pre: 1.6 L
FEV1FVC-%Change-Post: 11 %
FEV1FVC-%Pred-Pre: 62 %
FEV6-%Change-Post: 0 %
FEV6-%Pred-Post: 74 %
FEV6-%Pred-Pre: 74 %
FEV6-Post: 3.27 L
FEV6-Pre: 3.28 L
FEV6FVC-%Change-Post: 0 %
FEV6FVC-%Pred-Post: 101 %
FEV6FVC-%Pred-Pre: 101 %
FVC-%Change-Post: 1 %
FVC-%Pred-Post: 74 %
FVC-%Pred-Pre: 73 %
FVC-Post: 3.44 L
FVC-Pre: 3.4 L
Post FEV1/FVC ratio: 53 %
Post FEV6/FVC ratio: 96 %
Pre FEV1/FVC ratio: 47 %
Pre FEV6/FVC Ratio: 96 %
RV % pred: 119 %
RV: 2.78 L
TLC % pred: 86 %
TLC: 6.09 L

## 2021-01-18 NOTE — Progress Notes (Signed)
PFT done today. 

## 2021-01-24 ENCOUNTER — Encounter: Payer: Self-pay | Admitting: Pulmonary Disease

## 2021-01-24 ENCOUNTER — Telehealth: Payer: Self-pay | Admitting: Pulmonary Disease

## 2021-01-24 NOTE — Telephone Encounter (Signed)
Will do!

## 2021-01-24 NOTE — Telephone Encounter (Signed)
Spoke to [place] with [facility], who is requesting last OV note and PFT. Patient is in the process of re qualifying  for hospice.  Julies fax number is [phone].  Dr. Patsey Berthold, please advise. OV note is not complete.

## 2021-01-24 NOTE — Telephone Encounter (Signed)
Stephen [name] called back and requested that physician notes be sent along w/ pft result.

## 2021-01-24 NOTE — Telephone Encounter (Signed)
OV note and PFT has been faxed to [place] at [phone]. Received successful fax confirmation.  Nothing further needed.

## 2021-04-11 ENCOUNTER — Telehealth: Payer: Self-pay | Admitting: Pulmonary Disease

## 2021-04-11 ENCOUNTER — Encounter (HOSPITAL_COMMUNITY): Payer: 59

## 2021-04-11 NOTE — Telephone Encounter (Addendum)
Spoke to [place] with [facility] , who is requesting last OV notes to be faxed.  Aaron Edelman is unsure of fax number, however he will call back with fax number.

## 2021-04-11 NOTE — Telephone Encounter (Signed)
Aaron Edelman called back with fax number of [phone]. Last OV note has been faxed to provided fax number.  Nothing further needed at this time.

## 2021-05-01 ENCOUNTER — Ambulatory Visit: Payer: 59 | Admitting: Pulmonary Disease

## 2021-05-01 ENCOUNTER — Encounter: Payer: Self-pay | Admitting: Pulmonary Disease

## 2021-05-01 ENCOUNTER — Other Ambulatory Visit: Payer: Self-pay

## 2021-05-01 VITALS — BP 110/80 | HR 89 | Temp 97.5°F | Ht 69.0 in | Wt 169.0 lb

## 2021-05-01 DIAGNOSIS — R0602 Shortness of breath: Secondary | ICD-10-CM | POA: Diagnosis not present

## 2021-05-01 DIAGNOSIS — Z8521 Personal history of malignant neoplasm of larynx: Secondary | ICD-10-CM | POA: Diagnosis not present

## 2021-05-01 DIAGNOSIS — J449 Chronic obstructive pulmonary disease, unspecified: Secondary | ICD-10-CM | POA: Diagnosis not present

## 2021-05-01 DIAGNOSIS — Z87891 Personal history of nicotine dependence: Secondary | ICD-10-CM

## 2021-05-01 DIAGNOSIS — J386 Stenosis of larynx: Secondary | ICD-10-CM | POA: Diagnosis not present

## 2021-05-01 MED ORDER — BENZONATATE 100 MG PO CAPS: 100.0000 mg | ORAL_CAPSULE | Freq: Three times a day (TID) | ORAL | 2 refills | Status: AC | PRN

## 2021-05-01 NOTE — Progress Notes (Signed)
Subjective:    Patient ID: Stephen [name], male    DOB: 04/05/1956, 65 y.o.   MRN: [id number] Chief Complaint  Patient presents with  . Follow-up    SOB, occasional cough.wheezing.    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  He presents for follow-up on issues with dyspnea.  He has a history of COPD GOLD class III. Alpha-1 phenotype MZ prior right lung lobectomy for lung cancer in 2006.  His current respiratory issues are mostly due to significant glottic stenosis however the patient does not want to undergo tracheostomy which would likely improve his respiratory status.  He states that when he gets short of breath he "panics" and this makes his shortness of breath worse.  This is likely due to his significant glottic stenosis.  He underwent direct laryngoscopy on 08 February 2021 by Dr. Redmond Baseman.  Continues to show that his glottic space is about 50% of normal.  This coupled with his moderate to severe COPD is likely the cause of his severe dyspnea.  Dr. Redmond Baseman discussed with him the option of tracheostomy but he continued to decline.  I discussed the same issue with him today and he continues to decline this option.  [name] presents with usual complaint of shortness of breath.  This has not increased over baseline.  He has issues with "panicking" when he gets fatigued or short of breath.  Panicking sensation makes his symptoms worse.  As noted above he is continues to decline tracheostomy which I think would help his breathing significantly.  He has not had any fevers, chills or sweats.  No change in the character of cough or sputum production.  Tessalon Perles help with cough.  Feels that his current bronchodilator therapy is adequate.  Not endorse any other complaint.  The majority of the visit was spent in  allowing him to "vent" his frustrations with his breathing.  Despite this he does not endorse any suicidal thought/ideation.   Review of Systems A 10 point review of systems was performed and it is as noted above otherwise negative.  Patient Active Problem List   Diagnosis Date Noted  . Pharyngeal dysphagia 10/20/2020  . Shortness of breath 07/27/2019  . Palliative care encounter 06/28/2019  . Laryngeal stenosis 07/16/2018  . Healthcare-associated pneumonia 07/10/2018  . HCAP (healthcare-associated pneumonia) 07/10/2018  . Protein-calorie malnutrition, severe 06/25/2018  . Acute exacerbation of chronic obstructive pulmonary disease (COPD) ([place]) 06/24/2018  . Esophageal dysmotility 06/24/2018  . GERD (gastroesophageal reflux disease) 06/24/2018  . History of lung cancer and throat cancer 06/24/2018  . FTT (failure to thrive) in adult 06/24/2018  . Abnormal echocardiogram 06/24/2018  . Hyperlipidemia 05/22/2018  . CAD (coronary artery disease)   . COPD with acute exacerbation ([place]) 05/05/2018  . Elevated troponin 05/04/2018  . Acute respiratory failure with hypoxia ([place]) 05/04/2018  . Hypothyroid 01/28/2018  . Malignant neoplasm of glottis ([place]) 01/13/2018  . Dysphonia 12/12/2017  . Alpha-1-antitrypsin deficiency carrier 07/17/2016  . Eosinophilia 05/30/2016  . History of seasonal allergies 05/30/2016  . Stopped smoking with greater than 40 pack year history 05/30/2016  . History of lobectomy of lung 05/30/2016  . COLD (chronic obstructive lung disease) ([place]) 07/27/2015  . History of elevated PSA 07/27/2015  . COPD exacerbation ([place]) 01/23/2015  . COPD, severe ([place]) 01/12/2015  . Research study patient 10/07/2014  .  Need for prophylactic vaccination against Streptococcus pneumoniae (pneumococcus) 09/15/2014  . Chest pain 08/09/2013  . History of TIA (transient ischemic attack) 08/09/2013  . Otitis media of left ear 08/09/2013  . Lung cancer ([place]) 09/16/2012  . Hemorrhoids,  external, thrombosed 08/26/2011  . BACK PAIN 02/22/2011  . COPD (chronic obstructive pulmonary disease) ([place]) 02/06/2011  . ROTATOR CUFF TEAR 02/15/2010  . ALLERGIC RHINITIS 04/15/2008   Social History   Tobacco Use  . Smoking status: Former Smoker    Packs/day: 1.50    Years: 32.00    Pack years: 48.00    Types: Cigarettes    Quit date: 12/30/2004    Years since quitting: 16.3  . Smokeless tobacco: Never Used  . Tobacco comment: 1 ppd x 30 years  Substance Use Topics  . Alcohol use: Yes    Comment: 1- 2 beers monthly   Allergies  Allergen Reactions  . Singulair [Montelukast Sodium]   . Codeine Other (See Comments)    Unknown reaction. Mother told him he was allergic  . Cymbalta [Duloxetine Hcl] Nausea And Vomiting  . Montelukast Sodium Other (See Comments)    Causes sinusitis   Current Meds  Medication Sig  . albuterol (PROVENTIL HFA;VENTOLIN HFA) 108 (90 Base) MCG/ACT inhaler Inhale 1-2 puffs into the lungs every 6 (six) hours as needed for wheezing or shortness of breath.  Marland Kitchen albuterol (PROVENTIL) (2.5 MG/3ML) 0.083% nebulizer solution Take 2.5 mg by nebulization every 6 (six) hours as needed for wheezing or shortness of breath.  . ARIPiprazole (ABILIFY) 5 MG tablet Take 5 mg by mouth daily.  Marland Kitchen aspirin EC 81 MG tablet Take 81 mg by mouth daily.  . benzonatate (TESSALON) 100 MG capsule Take 100 mg by mouth 3 (three) times daily as needed for cough.  . budesonide (PULMICORT) 0.25 MG/2ML nebulizer solution USE 1 VIAL VIA NEBULIZER TWICE DAILY  . ipratropium-albuterol (DUONEB) 0.5-2.5 (3) MG/3ML SOLN Take 3 mLs by nebulization every 6 (six) hours as needed (shortness of breath).  . lansoprazole (PREVACID) 30 MG capsule Take 1 capsule (30 mg total) by mouth daily.  Marland Kitchen levothyroxine (SYNTHROID, LEVOTHROID) 125 MCG tablet Take 62.5 mcg by mouth daily before breakfast.   . LORazepam (ATIVAN) 0.5 MG tablet Take 0.5 mg by mouth every 8 (eight) hours as needed for anxiety.  .  mirtazapine (REMERON) 30 MG tablet Take 30 mg by mouth at bedtime.  . Morphine Sulfate (MORPHINE CONCENTRATE) 10 mg / 0.5 ml concentrated solution Take 5 mg by mouth every 4 (four) hours as needed for severe pain or shortness of breath.  . Multiple Vitamin (MULTIVITAMIN WITH MINERALS) TABS tablet Take 1 tablet by mouth daily. Centrum Silver  . nitroGLYCERIN (NITROSTAT) 0.4 MG SL tablet Place 1 tablet (0.4 mg total) under the tongue every 5 (five) minutes as needed for chest pain.  Marland Kitchen omeprazole (PRILOSEC) 40 MG capsule Take 1 capsule (40 mg total) by mouth daily.  . OXYGEN Inhale 2 L/min into the lungs continuous.  . pravastatin (PRAVACHOL) 40 MG tablet Take 40 mg by mouth at bedtime.   . senna (SENOKOT) 8.6 MG TABS tablet Take 2 tablets by mouth as needed.  . temazepam (RESTORIL) 15 MG capsule Take 30 mg by mouth at bedtime.  . Vitamin D, Ergocalciferol, (DRISDOL) 50000 UNITS CAPS Take 50,000 Units by mouth every 14 (fourteen) days.    Immunization History  Administered Date(s) Administered  . Influenza Inj Mdck Quad Pf 09/23/2019  . Influenza Split 08/30/2013, 09/08/2014, 09/06/2015  . Influenza  Whole 10/01/2010, 10/02/2011, 09/29/2016  . Influenza,inj,Quad PF,6+ Mos 09/15/2018  . Influenza-Unspecified 09/10/2020  . Moderna Sars-Covid-2 Vaccination 04/07/2020, 04/13/2020  . Pneumococcal Conjugate-13 09/08/2014  . Pneumococcal Polysaccharide-23 10/30/2009  . Zoster 09/09/2016       Objective:   Physical Exam BP 110/80 (BP Location: Left Arm, Patient Position: Sitting, Cuff Size: Normal)   Pulse 89   Temp (!) 97.5 F (36.4 C) (Temporal)   Ht 5\' 9"  (1.753 m)   Wt 169 lb (76.7 kg)   SpO2 100%   BMI 24.96 kg/m   GENERAL: Chronically ill-appearing man, well-nourished, no acute distress, chronic use of accessories of respiration. Raspy voice.  HEAD: Normocephalic, atraumatic.  EYES: Pupils equal, round, reactive to light. No scleral icterus.  MOUTH: Nose/mouth/throat not examined  due to masking requirements for COVID 19. NECK: Supple. No thyromegaly. Prior tracheostomy scar. No JVD. No adenopathy. PULMONARY: Good air entry bilaterally.  Diffuse rhonchi, rare end expiratory wheezes. CARDIOVASCULAR: S1 and S2. Regular rate and rhythm. No rubs, murmurs or gallops heard. ABDOMEN: Benign. MUSCULOSKELETAL: No joint deformity, no clubbing, no edema.  On limited exam no rashes NEUROLOGIC: No overt focal deficit. SKIN: Intact,warm,dry. PSYCH: Mood depressed and behavior appropriate.      Assessment & Plan:      ICD-10-CM   1. COPD, severe ([place])  J44.9    Continue DuoNeb and Pulmicort Continue as needed albuterol Tessalon Perles for cough  2. Shortness of breath  R06.02    Multifactorial: COPD/glottic stenosis Would benefit from tracheostomy Patient unwilling to consider  3. Glottic stenosis  J38.6    50% stenosis, patient reluctant to go tracheostomy This is a main component of his dyspnea  4. Hx of laryngeal cancer  Z85.21    This issue adds complexity to his management Prior surgery resulted in 50% glottic stenosis  5. Former heavy cigarette smoker (20-39 per day)  Z87.891    No evidence of relapse   Patient is to continue his current regimen.  We will see him in follow-up in 3 months time he is to contact us prior to that time should any new difficulties arise.   Renold Don, MD [place] [facility] [facility]   *This note was dictated using voice recognition software/Dragon.  Despite best efforts to proofread, errors can occur which can change the meaning.  Any change was purely unintentional.

## 2021-05-01 NOTE — Patient Instructions (Signed)
We sent in the prescription for Tessalon Perles for you   We will see him in follow-up in 3 months time call sooner should any new difficulties arise

## 2021-05-11 ENCOUNTER — Encounter: Payer: Self-pay | Admitting: Pulmonary Disease

## 2021-05-16 ENCOUNTER — Telehealth: Payer: Self-pay | Admitting: Pulmonary Disease

## 2021-05-16 MED ORDER — BUDESONIDE 0.25 MG/2ML IN SUSP: RESPIRATORY_TRACT | 2 refills | Status: DC

## 2021-05-16 NOTE — Telephone Encounter (Signed)
Rx for Pulmicort has been sent to preferred pharmacy.  Aaron Edelman with hospice is aware and voiced his understanding.  Nothing further needed.

## 2021-08-30 ENCOUNTER — Other Ambulatory Visit: Payer: Self-pay | Admitting: *Deleted

## 2021-08-30 MED ORDER — BUDESONIDE 0.25 MG/2ML IN SUSP: RESPIRATORY_TRACT | 2 refills | Status: DC

## 2021-10-17 ENCOUNTER — Telehealth: Payer: Self-pay | Admitting: Pulmonary Disease

## 2021-10-17 NOTE — Telephone Encounter (Signed)
When would you like this patient worked in the office?  Please advise.

## 2021-10-17 NOTE — Telephone Encounter (Signed)
Primary Pulmonologist: Dr. Patsey Berthold Last office visit and with whom: Dr. Patsey Berthold and OV was 05/01/2021 What do we see them for (pulmonary problems): COPD Last OV assessment/plan: 05/01/2021  Was appointment offered to patient (explain)?  Aaron Edelman from hospice wants to know if we have any other recommendations for a visit since this is a [place] patient and there is not a provider until 10/18/21 in [place] office   Reason for call: the patient is being discharged from hospice as of tomorrow since he has improved and the nurse that is working with the patient went out to the patients house and tried everything such as walking up hills, walking on flat surfaces and his oxygen was only on 93% room air after walking 15-20 minutes outside and inside.  The patient is furious that he is about to lose his oxygen tanks after he is discharged. Should we have him come in for a visit to see about getting re qualified for oxygen? Per the nurse at [facility] he told me that the patient was having a lot of trouble after and it took about 10 minutes to recover after being short of breath.   Dr. Patsey Berthold in back in office tomorrow but does not have any openings.   (examples of things to ask: : When did symptoms start? Fever? Cough? Productive? Color to sputum? More sputum than usual? Wheezing? Have you needed increased oxygen? Are you taking your respiratory medications? What over the counter measures have you tried?)  Allergies  Allergen Reactions   Singulair [Montelukast Sodium]    Codeine Other (See Comments)    Unknown reaction. Mother told him he was allergic   Cymbalta [Duloxetine Hcl] Nausea And Vomiting   Montelukast Sodium Other (See Comments)    Causes sinusitis    Immunization History  Administered Date(s) Administered   Influenza Inj Mdck Quad Pf 09/23/2019   Influenza Split 08/30/2013, 09/08/2014, 09/06/2015   Influenza Whole 10/01/2010, 10/02/2011, 09/29/2016   Influenza,inj,Quad PF,6+ Mos  09/15/2018   Influenza-Unspecified 09/10/2020   Moderna Sars-Covid-2 Vaccination 04/07/2020, 04/13/2020   Pneumococcal Conjugate-13 09/08/2014   Pneumococcal Polysaccharide-23 10/30/2009   Zoster, Live 09/09/2016          Aaron Edelman has called the office to let the office know that this patient has improved enough that he is improved so much that he is being discharged from hospice and since he does not qualify for oxygen.   Aaron Edelman reports that he has tried to get the patient qualified but was unsuccessful. Please adivise.

## 2021-10-18 NOTE — Telephone Encounter (Signed)
First available with Dr. Patsey Berthold is 11/21/2021.  Dr. Patsey Berthold, is this appt okay?

## 2021-10-18 NOTE — Telephone Encounter (Signed)
Lm for patient.  

## 2021-10-18 NOTE — Telephone Encounter (Signed)
I called and spoke with the pt  He does not prefer to wait that long for appt  I scheduled him to see Beth on 10/31/21 at 11:30 am in [place] and he is aware to arrive at 11:15  He will call sooner if needed

## 2021-10-24 ENCOUNTER — Telehealth: Payer: Self-pay | Admitting: Nurse Practitioner

## 2021-10-24 NOTE — Telephone Encounter (Signed)
Spoke with patient regarding Palliative referral and he was in agreement with scheduling visit.  I have scheduled an In-home Consult for 11/20/21 @ 9:15 AM

## 2021-10-31 ENCOUNTER — Ambulatory Visit: Payer: Medicare Other | Admitting: Primary Care

## 2021-10-31 ENCOUNTER — Other Ambulatory Visit: Payer: Self-pay

## 2021-10-31 ENCOUNTER — Encounter: Payer: Self-pay | Admitting: Primary Care

## 2021-10-31 VITALS — BP 90/60 | HR 79 | Temp 97.3°F | Ht 70.0 in | Wt 169.0 lb

## 2021-10-31 DIAGNOSIS — J9601 Acute respiratory failure with hypoxia: Secondary | ICD-10-CM | POA: Diagnosis not present

## 2021-10-31 DIAGNOSIS — J386 Stenosis of larynx: Secondary | ICD-10-CM | POA: Diagnosis not present

## 2021-10-31 DIAGNOSIS — J449 Chronic obstructive pulmonary disease, unspecified: Secondary | ICD-10-CM

## 2021-10-31 DIAGNOSIS — J9611 Chronic respiratory failure with hypoxia: Secondary | ICD-10-CM

## 2021-10-31 MED ORDER — BUDESONIDE 0.5 MG/2ML IN SUSP: 0.5000 mg | Freq: Two times a day (BID) | RESPIRATORY_TRACT | 11 refills | Status: DC

## 2021-10-31 NOTE — Progress Notes (Signed)
@Patient  ID: Stephen [name], male    DOB: 1956/10/23, 65 y.o.   MRN: [id number]  No chief complaint on file.   Referring provider: [name], MD  HPI: 65 year old male, former smoker. PMH significant for severe COPD, laryngeal stenosis, lung cancer, pharyngeal dysphagia. Former patient of Dr. Chase Caller, currently established with Dr. Patsey Berthold.   10/31/2021 Patient presents today today follow-up/O2 qualification. 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 Upper airways obstruction from very narrowed larynx after partial laryngectomy and radiation therapy. He has been seen by ENT in the past and they recommended tracheostomy but patient refused. He has no acute complaints today except for chronic dyspnea with exertion and anxiety. No active cough. He is using budesonide and ipratropium-albuterol nebulizer's as directed.  He is currently on oxygen through hospice, he was recently discharged from them and will be transitioning to palliative care.  He is looking to qualify for oxygen today.    Allergies  Allergen Reactions   Singulair [Montelukast Sodium]    Codeine Other (See Comments)    Unknown reaction. Mother told him he was allergic   Cymbalta [Duloxetine Hcl] Nausea And Vomiting   Montelukast Sodium Other (See Comments)    Causes sinusitis    Immunization History  Administered Date(s) Administered   Influenza Inj Mdck Quad Pf 09/23/2019   Influenza Split 09/13/2009, 01/19/2010, 01/22/2011, 09/09/2011, 09/29/2012, 08/02/2013, 08/30/2013, 09/08/2014, 09/06/2015   Influenza Whole 10/01/2010, 10/02/2011, 09/29/2016   Influenza, Quadrivalent, Recombinant, Inj, Pf 09/08/2020, 09/26/2021   Influenza,inj,Quad PF,6+ Mos 09/15/2018   Influenza-Unspecified 08/27/2016, 10/14/2017, 09/10/2020   Moderna Sars-Covid-2 Vaccination 04/07/2020, 04/13/2020   Pneumococcal Conjugate-13 12/30/2012, 09/08/2014   Pneumococcal Polysaccharide-23 08/05/2007, 10/30/2009    Pneumococcal-Unspecified 06/14/2013   Tdap 05/01/2005, 01/22/2011, 01/22/2011   Zoster, Live 09/09/2016    Past Medical History:  Diagnosis Date   Allergic rhinitis, cause unspecified    Anxiety    Blindness of left eye    CAD (coronary artery disease)    Coronary CTA 7/19: Calcium score 0, LAD with proximal discrete area of soft plaque;  Mid LAD FFR 0.86 - No hemodynamically significant coronary stenoses by CT FFR.   Complication of anesthesia    difficulty awaking after colonoscopy    Depression    Dyspnea    GERD (gastroesophageal reflux disease)    Hypothyroidism    Lung cancer ([place])    lung ca dx 06, has had chemo and radiation   Osteoporosis    Other emphysema ([place])    copd   Throat cancer ([place])    throat ca dx 2007   Thrombosed hemorrhoids    Tubular adenoma of colon 2009   Unspecified vitamin D deficiency     Tobacco History: Social History   Tobacco Use  Smoking Status Former   Packs/day: 1.50   Years: 32.00   Pack years: 48.00   Types: Cigarettes   Quit date: 12/30/2004   Years since quitting: 16.9  Smokeless Tobacco Never  Tobacco Comments   1 ppd x 30 years   Counseling given: Not Answered Tobacco comments: 1 ppd x 30 years   Outpatient Medications Prior to Visit  Medication Sig Dispense Refill   albuterol (PROVENTIL HFA;VENTOLIN HFA) 108 (90 Base) MCG/ACT inhaler Inhale 1-2 puffs into the lungs every 6 (six) hours as needed for wheezing or shortness of breath.     albuterol (PROVENTIL) (2.5 MG/3ML) 0.083% nebulizer solution Take 2.5 mg by nebulization every 6 (six) hours as needed for wheezing or shortness of  breath.     ARIPiprazole (ABILIFY) 5 MG tablet Take 5 mg by mouth daily.     aspirin EC 81 MG tablet Take 81 mg by mouth daily.     benzonatate (TESSALON PERLES) 100 MG capsule Take 1 capsule (100 mg total) by mouth 3 (three) times daily as needed for cough. 30 capsule 2   ipratropium-albuterol (DUONEB) 0.5-2.5 (3) MG/3ML SOLN Take 3 mLs by  nebulization every 6 (six) hours as needed (shortness of breath). 360 mL 11   lansoprazole (PREVACID) 30 MG capsule Take 1 capsule (30 mg total) by mouth daily. 30 capsule 1   levothyroxine (SYNTHROID, LEVOTHROID) 125 MCG tablet Take 62.5 mcg by mouth daily before breakfast.      LORazepam (ATIVAN) 0.5 MG tablet Take 0.5 mg by mouth every 8 (eight) hours as needed for anxiety.     mirtazapine (REMERON) 30 MG tablet Take 30 mg by mouth at bedtime.     Morphine Sulfate (MORPHINE CONCENTRATE) 10 mg / 0.5 ml concentrated solution Take 5 mg by mouth every 4 (four) hours as needed for severe pain or shortness of breath.     Multiple Vitamin (MULTIVITAMIN WITH MINERALS) TABS tablet Take 1 tablet by mouth daily. Centrum Silver     nitroGLYCERIN (NITROSTAT) 0.4 MG SL tablet Place 1 tablet (0.4 mg total) under the tongue every 5 (five) minutes as needed for chest pain. 20 tablet 0   omeprazole (PRILOSEC) 40 MG capsule Take 1 capsule (40 mg total) by mouth daily. 30 capsule 11   OXYGEN Inhale 2 L/min into the lungs continuous.     pravastatin (PRAVACHOL) 40 MG tablet Take 40 mg by mouth at bedtime.      senna (SENOKOT) 8.6 MG TABS tablet Take 2 tablets by mouth as needed.     temazepam (RESTORIL) 15 MG capsule Take 30 mg by mouth at bedtime.     Vitamin D, Ergocalciferol, (DRISDOL) 50000 UNITS CAPS Take 50,000 Units by mouth every 14 (fourteen) days.      budesonide (PULMICORT) 0.25 MG/2ML nebulizer solution USE 1 VIAL VIA NEBULIZER TWICE DAILY 120 mL 2   No facility-administered medications prior to visit.    Review of Systems  Review of Systems  Constitutional: Negative.   HENT: Negative.    Respiratory:  Positive for shortness of breath. Negative for cough and chest tightness.   Cardiovascular: Negative.     Physical Exam  BP 90/60 (BP Location: Left Arm, Patient Position: Sitting, Cuff Size: Normal)   Pulse 79   Temp (!) 97.3 F (36.3 C) (Oral)   Ht 5\' 10"  (1.778 m)   Wt 169 lb (76.7 kg)    SpO2 97%   BMI 24.25 kg/m  Physical Exam Constitutional:      Appearance: Normal appearance.  HENT:     Head: Normocephalic and atraumatic.     Mouth/Throat:     Comments: Deferred d/t masking Cardiovascular:     Rate and Rhythm: Normal rate and regular rhythm.  Pulmonary:     Effort: Pulmonary effort is normal.     Breath sounds: Normal breath sounds. Stridor present. No wheezing or rhonchi.  Musculoskeletal:        General: Normal range of motion.  Skin:    General: Skin is warm and dry.  Neurological:     General: No focal deficit present.     Mental Status: He is alert and oriented to person, place, and time. Mental status is at baseline.  Psychiatric:  Mood and Affect: Mood normal.        Behavior: Behavior normal.        Thought Content: Thought content normal.        Judgment: Judgment normal.     Lab Results:  CBC    Component Value Date/Time   WBC 10.2 12/03/2019 0048   RBC 4.83 12/03/2019 0048   HGB 13.7 12/03/2019 0048   HGB 15.3 06/09/2017 1116   HCT 42.1 12/03/2019 0048   HCT 44.8 06/09/2017 1116   PLT 231 12/03/2019 0048   PLT 168 06/09/2017 1116   MCV 87.2 12/03/2019 0048   MCV 84.2 06/09/2017 1116   MCH 28.4 12/03/2019 0048   MCHC 32.5 12/03/2019 0048   RDW 12.6 12/03/2019 0048   RDW 13.6 06/09/2017 1116   LYMPHSABS 0.8 12/03/2019 0048   LYMPHSABS 0.8 (L) 06/09/2017 1116   MONOABS 0.7 12/03/2019 0048   MONOABS 0.6 06/09/2017 1116   EOSABS 0.2 12/03/2019 0048   EOSABS 0.2 06/09/2017 1116   BASOSABS 0.1 12/03/2019 0048   BASOSABS 0.1 06/09/2017 1116    BMET    Component Value Date/Time   NA 141 12/03/2019 0048   NA 140 06/09/2017 1116   K 3.5 12/03/2019 0048   K 4.0 06/09/2017 1116   CL 104 12/03/2019 0048   CL 106 09/14/2012 0808   CO2 29 12/03/2019 0048   CO2 28 06/09/2017 1116   GLUCOSE 100 (H) 12/03/2019 0048   GLUCOSE 97 06/09/2017 1116   GLUCOSE 98 09/14/2012 0808   BUN 10 12/03/2019 0048   BUN 16.0 06/09/2017 1116    CREATININE 0.82 12/03/2019 0048   CREATININE 1.0 06/09/2017 1116   CALCIUM 9.1 12/03/2019 0048   CALCIUM 9.8 06/09/2017 1116   GFRNONAA >60 12/03/2019 0048   GFRAA >60 12/03/2019 0048    BNP    Component Value Date/Time   BNP 61.4 06/24/2018 0551    ProBNP No results found for: PROBNP  Imaging: No results found.   Assessment & Plan:   COPD, severe ([place]) - Stable interval; No acute complaints. Chronic dyspnea symptoms unchanged. He is compliant with ICS and SABA/SAMA nebulizer's  - Continue Budesonide twice a day (we will increase dose to 0.5mg /33ml) and Ipratropium-albuterol nebulizer 3-4 times a day - Take Mucinex twice daily as needed for congestion/cough  Laryngeal stenosis - Upper airway obstruction from very narrowed larynx after partial laryngectomy and radiation therapy. Stridor present on exam. ENT recommended tracheostomy but patient is not open to proceeding with this treatment course. Patient has been discharged from hospice and will be transitioned to palliative care.  Acute respiratory failure with hypoxia (HCC) - Resolved; This is an airway resistance issue, not an oxygen issue. He does not require supplemental oxygen during daytime with exertion. We will check overnight oximetry.     Martyn Ehrich, NP 12/24/2021

## 2021-10-31 NOTE — Patient Instructions (Addendum)
Recommendations: - Use Budesonide twice a day (we will increase dose to 0.5mg /86ml- sending in new prescription) - Use Ipratropium-albuterol nebulizer 3-4 times a day - Take Mucinex twice daily as needed for congestion/cough  Orders: - Overnight oximetry re: chronic respiratory failure  Follow-up:  - 3 month follow-up with Stephen [name]

## 2021-11-08 ENCOUNTER — Telehealth: Payer: Self-pay | Admitting: Primary Care

## 2021-11-08 NOTE — Telephone Encounter (Signed)
Spoke to patient, who stated Pulmicort 0.5 is not covered by insurance.  I spoke to [facility] with [facility], who stated that copay for Pulmicort is 43.76 Patient is aware of cost and voiced his understanding.  Nothing further needed at this time.

## 2021-11-08 NOTE — Telephone Encounter (Signed)
Lm for patient.  

## 2021-11-09 ENCOUNTER — Telehealth: Payer: Self-pay | Admitting: Primary Care

## 2021-11-09 NOTE — Telephone Encounter (Signed)
Call made to patient, confirmed DOB. Patient states last night while doing the overnight oxygen test it only blinked yellow. The instructions states if the reading is good the machine should be blinking green. Patient states he used his oxygen while taking the test last night. He states this morning he realized he had the machine on wrong and wants to know if he can repeat the study tonight.   Call made to [facility]. Spoke with [name], the rep for the ONO was not available at the time. Voicemail left.

## 2021-11-20 ENCOUNTER — Encounter: Payer: Self-pay | Admitting: Nurse Practitioner

## 2021-11-20 ENCOUNTER — Other Ambulatory Visit: Payer: Self-pay

## 2021-11-20 ENCOUNTER — Other Ambulatory Visit: Payer: Medicare Other | Admitting: Nurse Practitioner

## 2021-11-20 DIAGNOSIS — J449 Chronic obstructive pulmonary disease, unspecified: Secondary | ICD-10-CM

## 2021-11-20 DIAGNOSIS — R0600 Dyspnea, unspecified: Secondary | ICD-10-CM

## 2021-11-20 DIAGNOSIS — Z515 Encounter for palliative care: Secondary | ICD-10-CM

## 2021-11-20 NOTE — Progress Notes (Signed)
[facility] Consult Note Telephone: [phone]  Fax: [phone]    Date of encounter: 11/20/21 3:24 PM PATIENT NAME: Stephen [name] [address] [place] [address]   [phone] (home)  DOB: March 30, 1956 MRN: [id number] PRIMARY CARE PROVIDER:    [name], MD,  [place] [address] [phone] RESPONSIBLE PARTY:    Contact Information     Name Relation Home Work Mobile   Dillard,Jo Significant other [phone]        Due to the COVID-19 crisis, this visit was done via telemedicine from my office and it was initiated and consent by this patient and or family.  I connected with  Stephen [name] OR PROXY on 11/20/21 by a telephone as video not available enabled telemedicine application and verified that I am speaking with the correct person using two identifiers.   I discussed the limitations of evaluation and management by telemedicine. The patient expressed understanding and agreed to proceed. Palliative Care was asked to follow this patient by consultation request of  [name], MD to address advance care planning and complex medical decision making. This is a follow up visit.                                  ASSESSMENT AND PLAN / RECOMMENDATIONS:  Symptom Management/Plan: 1. ACP: Full code with aggressive interventions   2. Dyspneic secondary COPD to remain stable at present time. Stephen [name] continues to have O2 at home, energy conservation, inhalation therapy for comfort; continue roxanol; ativan and increase nebulizers   3. Palliative care encounter; Palliative medicine team will continue to support patient, patient's family, and medical team. Visit consisted of counseling and education dealing with the complex and emotionally intense issues of symptom management and palliative care in the setting of serious and potentially life-threatening illness  Follow up Palliative Care Visit:  Palliative care will continue to follow for complex medical decision making, advance care planning, and clarification of goals. Return 8 weeks or prn.  I spent 65 minutes providing this consultation. More than 50% of the time in this consultation was spent in counseling and care coordination.  PPS: 60%  HOSPICE ELIGIBILITY/DIAGNOSIS: TBD  Chief Complaint: Follow up palliative consult for complex medical decision making  HISTORY OF PRESENT ILLNESS:  Stephen [name] is a 65 y.o. 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 I called Stephen [name] for scheduled in person PC f/u visit following Hospice discharge. Stephen [name] with +COVID screening; changed to telemedicine telephonic is video not available follow-up palliative care visit. Stephen [name] [name] and I talked about purpose of visit. Stephen [name] [name] and agreement. We talked about how he's feeling today. Stephen [name] [name] endorses that he is not doing very well at times. Stephen [name] endorses he continues to be short of breath intermit. We talked about current regimen. We talked about when d/c from Hospice. We talked about O2. Stephen [name] endorses the O2 has remained at his home. Stephen [name] endorses he just completed a breathing test with Dr Duwayne Heck, Pulmonology. We reviewed  past medical history, chronic disease progression. We talked about appetite. We talked about medications including roxanol. We talked about relief received from roxanol but he only uses when severe short of breath. We talked about Stephen [name] continues to drive, lives by  himself, independent. We talked about currently stable. Stephen [name] talked about his experience with Hospice. Stephen [name] talked at length about his frustrations, resentments, and anger about his situation. Stephen [name] talked about a counselor who calls him to talk. Stephen [name] endorses she shared he needs to be more social, get out, go to activities. Stephen [name] endorses he had a lot of friends in the past, but felt like he was used. Stephen [name]. [place]as endorses he has been there for a lot of people in his lifetime. Stephen [name]. [place] endorses when he needs help no one is around. We talked about resentments. We talked about self work, grieving process. We talked about self healing. We talked about coping strategies. We talked about Thanksgiving coming up. Stephen [name]. Corne endorses he plans to go to [place]. We talked about PC SW contacting for supportive visit. Stephen [name]. Hing endorses the Hospice SW was very helpful. We talked about PC RN to make a visit in 4 weeks, then NP in 8 weeks. Stephen [name]. [place] in agreement. Scheduled. Most of PC visit, supportive, with therapeutic listening. Questions answered.   History obtained from review of EMR, discussion with Stephen [name]. Bhardwaj.  I reviewed available labs, medications, imaging, studies and related documents from the EMR.  Records reviewed and summarized above.   ROS Full 10 system review of systems performed and negative with exception of: as per HPI.   Physical Exam: Deferred  Questions and concerns were addressed. The patient/family was encouraged to call with questions and/or concerns. My contact information was provided. Provided general support and encouragement, no other unmet needs identified   Thank you for the opportunity to participate in the care of Stephen [name]. [place].  The palliative care team will continue to follow. Please call our office at [phone] if we can be of additional assistance.   This chart was dictated using voice recognition  software.  Despite best efforts to proofread,  errors can occur which can change the documentation meaning.   Lela Gell Z Arlee Santosuosso, NP   COVID-19 PATIENT SCREENING TOOL Asked and negative response unless otherwise noted:   Have you had symptoms of covid, tested positive or been in contact with someone with symptoms/positive test in the past 5-10 days?  YES

## 2021-11-20 NOTE — Telephone Encounter (Signed)
Called and spoke with pt to see if he was ever able to do the ONO and pt said that he did and stated that the machine was returned to [employer] either last Wed. 11/16 or last Thurs. 11/17. Stated to pt once we received the report that we would call him about the results and he verbalized understanding. Nothing further needed.

## 2021-12-03 NOTE — Telephone Encounter (Addendum)
ONO on RA from 11/14/21 showed patient spent 1 min 56 seconds with SpO2 <88%. Basal O2 92%, SpO2 low 86%. He does not qualify for nocturnal oxygen.  Follow up with Dr. Darnell Level if needed

## 2021-12-04 NOTE — Telephone Encounter (Addendum)
Patient is aware of results and voiced his understanding.  Pending OV 01/31/2022. Nothing further needed.

## 2021-12-17 ENCOUNTER — Other Ambulatory Visit: Payer: Medicare Other

## 2021-12-17 ENCOUNTER — Other Ambulatory Visit: Payer: Self-pay

## 2021-12-17 DIAGNOSIS — Z515 Encounter for palliative care: Secondary | ICD-10-CM

## 2021-12-17 NOTE — Progress Notes (Signed)
I connected with  Stephen [name] on 12/17/21 by telephone and verified that I am speaking with the correct person using two identifiers.   I discussed the limitations of evaluation and management by telemedicine. The patient expressed understanding and agreed to proceed.  Connected with patient telephonically for a follow up at the request of Christin Guslter, NP.  Patient noted his biggest concern is having oxygen covered by his insurance.  He reports having "up/down" spells or exacerbations.   Patient is utilizing nebulizers, oxygen as needed and lorazepam for anxiety. He reports completing an overnight oxygen study that shows he does not require nocturnal oxygen. Patient currently has a call into his PCP and would like to further discuss his situation with oxygen.  He currently has an oxygen concentrator and portable tanks that were provided through hospice.  He is uncertain when he will be required start paying for oxygen.   Patient shares he feels his quality of life is very poor.  He remains in his home for most of the time. He does not leave his home without someone coming with him due to fears of having an exacerbation with his breathing. Active listening provided.  Home visit scheduled for 1/9 @ 130 pm.

## 2021-12-24 NOTE — Assessment & Plan Note (Addendum)
-   Resolved; This is an airway resistance issue, not an oxygen issue. He does not require supplemental oxygen during daytime with exertion. We will check overnight oximetry.

## 2021-12-24 NOTE — Assessment & Plan Note (Addendum)
-   Upper airway obstruction from very narrowed larynx after partial laryngectomy and radiation therapy. Stridor present on exam. ENT recommended tracheostomy but patient is not open to proceeding with this treatment course. Patient has been discharged from hospice and will be transitioned to palliative care.

## 2021-12-24 NOTE — Assessment & Plan Note (Addendum)
-   Stable interval; No acute complaints. Chronic dyspnea symptoms unchanged. He is compliant with ICS and SABA/SAMA nebulizer's  - Continue Budesonide twice a day (we will increase dose to 0.5mg /82ml) and Ipratropium-albuterol nebulizer 3-4 times a day - Take Mucinex twice daily as needed for congestion/cough

## 2022-01-07 ENCOUNTER — Other Ambulatory Visit: Payer: Medicare Other

## 2022-01-07 ENCOUNTER — Other Ambulatory Visit: Payer: Self-pay

## 2022-01-07 DIAGNOSIS — Z515 Encounter for palliative care: Secondary | ICD-10-CM

## 2022-01-07 NOTE — Progress Notes (Signed)
PATIENT NAME: Stephen [name] DOB: 1956-06-08 MRN: [id number]  PRIMARY CARE PROVIDER: [name], MD  RESPONSIBLE PARTY:  Acct ID - Guarantor Home Phone Work Phone Relationship Acct Type  [id number] - [name],[name] [phone]  Self P/F     [place], [place],  [id number]    Joint visit made to patient's home with [place], SW.  Patient did not answer door after 2 attempts.  Phone call made to patient and he advised he was being treated for a sinus infection.  Patient is not feeling well today and remains in the bed.  Patient declined a visit today but is open to a phone call later this week.  Advised patient a follow up call would be made in a couple of days.  Patient denies any needs or concerns at this time.      Lorenza Burton, RN

## 2022-01-07 NOTE — Progress Notes (Signed)
COMMUNITY PALLIATIVE CARE SW NOTE  PATIENT NAME: Stephen [name] DOB: 02/07/1956 MRN: [id number]  PRIMARY CARE PROVIDER: [name], MD  RESPONSIBLE PARTY:  Acct ID - Guarantor Home Phone Work Phone Relationship Acct Type  [id number] - Cappuccio,[phone]  Self P/F     [address], [place], [place] [address]     PLAN OF CARE and INTERVENTIONS:              Palliative care encounter: SW and RN attempted to complete in home PC isit with patient. PC team arrived to patients home, with no answer to the door. SW called patient before leaving the home, patient shared that he was in fact, home, but was in bed due to not feeling well.  Patient shared that he had been dx with a sinus infection last week and is taking ABT to address his symptoms. Patient shared that he is not feeling up to a full/long visit at this time, but is open to [facility] following up next week.  Palliative medicine team will continue to support patient, patient's family, and medical team. Visit consisted of counseling and education dealing with the complex and emotionally intense issues of symptom management and palliative care in the setting of serious and potentially life-threatening illness.       SOCIAL HX:  Social History   Tobacco Use   Smoking status: Former    Packs/day: 1.50    Years: 32.00    Pack years: 48.00    Types: Cigarettes    Quit date: 12/30/2004    Years since quitting: 17.0   Smokeless tobacco: Never   Tobacco comments:    1 ppd x 30 years  Substance Use Topics   Alcohol use: Yes    Comment: 1- 2 beers monthly          [place] Henrene Pastor, [place]

## 2022-01-17 ENCOUNTER — Other Ambulatory Visit: Payer: Medicare Other

## 2022-01-17 ENCOUNTER — Other Ambulatory Visit: Payer: Self-pay

## 2022-01-17 DIAGNOSIS — Z515 Encounter for palliative care: Secondary | ICD-10-CM

## 2022-01-17 NOTE — Progress Notes (Signed)
PATIENT NAME: NYCERE PRESLEY DOB: May 27, 1956 MRN: [id number]  PRIMARY CARE PROVIDER: [name], MD  RESPONSIBLE PARTY:  Acct ID - Guarantor Home Phone Work Phone Relationship Acct Type  [id number] - [place]ab[place],TI903 [phone]  Self P/F     [address], [place], [place] [address]    Due to the COVID-19 crisis, this visit was done via telemedicine from my office and it was initiated and consent by this patient and or family.  I connected with  TOMMIE BOHLKEN OR PROXY on 01/17/22 by telephone and verified that I am speaking with the correct person using two identifiers.   I discussed the limitations of evaluation and management by telemedicine. The patient expressed understanding and agreed to proceed.   PLAN OF CARE and INTERVENTIONS:               1.  GOALS OF CARE/ ADVANCE CARE PLANNING:  Remain home and independent.               2.  PATIENT/CAREGIVER EDUCATION:  Diet.               4. PERSONAL EMERGENCY PLAN:  Activate 911 for emergencies.               5.  DISEASE STATUS:  Follow up phone call made to patient to check on overall status.    Appetite:  Patient reports is poor.  He endorses 1 meal a day.  He often will go out for his meal.  If he remains home, he only eats soup and sandwiches. Last weight he recalls is 169 lbs.  He feels he may have lost more weight but is uncertain.  We discuss nutritional status and importance of eating a well rounded diet.  Respiratory:  Patient reports his sinus infection has cleared.  He feels he has a cold now. Currently doing a nebulizer due to tightness in his chest.  No sputum production reported.  Patient feels his breathing is the same overall.   Follow Up:  Patient is agreeable to a home visit next Wednesday.  I will call patient the day before to confirm.      HISTORY OF PRESENT ILLNESS:  ROMEL DUMOND is a 66 y.o. 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    CODE STATUS: Full ADVANCED DIRECTIVES: No MOST FORM: No PPS: 50%        Lorenza Burton, RN

## 2022-01-23 ENCOUNTER — Other Ambulatory Visit: Payer: Self-pay

## 2022-01-23 ENCOUNTER — Other Ambulatory Visit: Payer: Medicare Other

## 2022-01-23 DIAGNOSIS — Z515 Encounter for palliative care: Secondary | ICD-10-CM

## 2022-01-23 NOTE — Progress Notes (Signed)
PATIENT NAME: Stephen [name] DOB: May 08, 1956 MRN: [id number]  PRIMARY CARE PROVIDER: [name], MD  RESPONSIBLE PARTY:  Acct ID - Guarantor Home Phone Work Phone Relationship Acct Type  [id number] - Stephen [name]  Self P/F     Stephen [name] [id number]   Due to the COVID-19 crisis, this visit was done via telemedicine from my office and it was initiated and consent by this patient and or family.  I connected with  Stephen [name] OR PROXY on 01/23/22 by telephone and verified that I am speaking with the correct person using two identifiers.   I discussed the limitations of evaluation and management by telemedicine. The patient expressed understanding and agreed to proceed.   PLAN OF CARE and INTERVENTIONS:               1.  GOALS OF CARE/ ADVANCE CARE PLANNING:  Remain home and independent for as long as possible.                2.  PATIENT/CAREGIVER EDUCATION:  MOW               4. PERSONAL EMERGENCY PLAN:  Activate 911 for emergencies.                5.  DISEASE STATUS:  Telephone call made to patient this am to confirm 1 pm appointment today.  No answer.  Message left.   Return call received at 130 pm from patient.  Patient states he was out taking care of insurance issues when I called this am.  He would prefer telephone visits vs home visits.  Depression:  Patient endorses ongoing depression.  He lacks motivation to complete tasks and is feeling sadness over his situation.  He does not have a desire to harm  himself or others.  Patient feels overwhelmed and often shuts down.  Discusses the need for assistance in the home to assist with housekeeping.  He has reached out to several people but states no one will commit.  Patient further discusses his medical situation and complications.  Appetite:  Patient endorses a poor appetite.  He often misses 2-3 days of meals.  Reports no food intake yesterday or Saturday.  He did go out this am for breakfast and  states this will likely be last meal of the day for him. Patient does not have the endurance to cook meals in the home. Discussed MOW.  Respiratory:  Patient reports breathing has improved.  He has not had any "breathing spells' that have required him to activate 911.  Patient endorses completing breathing treatments, lorazepam and morphine when his "breathing spell" occurs.  Follow Ups:  Patient endorses several upcoming visits next month.  He does not wish to have home visits at this time.  Palliative Care will continue to make telephone visits.   HISTORY OF PRESENT ILLNESS:  Stephen [name] is a 66 y.o. 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  Patient is being followed by Palliative Care every 4-8 weeks and PRN.   CODE STATUS: Full ADVANCED DIRECTIVES: No MOST FORM: No PPS: 50%  Stephen Burton, RN

## 2022-01-31 ENCOUNTER — Other Ambulatory Visit: Payer: Self-pay

## 2022-01-31 ENCOUNTER — Encounter: Payer: Self-pay | Admitting: Pulmonary Disease

## 2022-01-31 ENCOUNTER — Ambulatory Visit: Payer: Medicare Other | Admitting: Pulmonary Disease

## 2022-01-31 VITALS — BP 126/80 | HR 86 | Temp 96.9°F | Ht 69.0 in | Wt 169.6 lb

## 2022-01-31 DIAGNOSIS — J9611 Chronic respiratory failure with hypoxia: Secondary | ICD-10-CM

## 2022-01-31 DIAGNOSIS — J449 Chronic obstructive pulmonary disease, unspecified: Secondary | ICD-10-CM

## 2022-01-31 DIAGNOSIS — J386 Stenosis of larynx: Secondary | ICD-10-CM

## 2022-01-31 NOTE — Progress Notes (Signed)
Subjective:    Patient ID: Stephen [name], male    DOB: April 27, 1956, 66 y.o.   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  He presents for follow-up on issues with dyspnea.  He was last seen at the office by Buelah Manis, NP on 31 October 2021.  He has a history of COPD GOLD class III. Alpha-1 phenotype MZ prior right lung lobectomy for lung cancer in 2006.  His current respiratory issues are mostly due to significant glottic stenosis however the patient does not want to undergo tracheostomy which would likely improve his respiratory status.  He states that when he gets short of breath he "panics" and this makes his shortness of breath worse.  This is likely due to his significant glottic stenosis.   He underwent direct laryngoscopy on 08 February 2021 by Dr. Jenne Pane.  This showed that his glottic space is about 50% of normal.  This coupled with his moderate to severe COPD is likely the cause of his severe dyspnea.  Dr. Jenne Pane discussed with him the option of tracheostomy but he continued to decline.  I discussed the same issue with him today and he continues to decline this option.   [name] presents today with usual complaint of shortness of breath.  This has not increased over baseline.  He has issues with "panicking" when he gets fatigued or short of breath.  Panicking sensation makes his symptoms worse.  As noted above he is continues to decline tracheostomy which I think would help his breathing significantly.  He has not had any fevers, chills or sweats.  No change in the character of cough or sputum production.  Tessalon Perles help with cough.  Feels that his current bronchodilator therapy is adequate. The majority of the visit was spent in  allowing him to "vent" his frustrations with his breathing.  Despite this he does not endorse any suicidal thought/ideation.  He does note issues with constipation but feels that the Senokot is too strong.  Also wanted to know about protein supplements.  Review of Systems A 10 point review of systems was performed and it is as noted above otherwise negative.  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   Protein-calorie malnutrition, severe 06/25/2018   Esophageal dysmotility 06/24/2018   GERD (gastroesophageal reflux disease) 06/24/2018   History of lung cancer and throat cancer 06/24/2018   FTT (failure to thrive) in adult 06/24/2018   Abnormal echocardiogram 06/24/2018   Hyperlipidemia 05/22/2018   CAD (coronary artery disease)    Elevated troponin 05/04/2018   Acute respiratory failure with hypoxia (HCC) 05/04/2018   Hypothyroid 01/28/2018   Malignant neoplasm of glottis (HCC) 01/13/2018   Dysphonia 12/12/2017   Alpha-1-antitrypsin deficiency carrier 07/17/2016   Eosinophilia 05/30/2016   History of seasonal allergies 05/30/2016   Stopped smoking with greater than 40 pack year history 05/30/2016   History of lobectomy of lung 05/30/2016   History of elevated PSA 07/27/2015   COPD, severe (HCC) 01/12/2015   Research study patient 10/07/2014   Need for prophylactic vaccination against Streptococcus pneumoniae (pneumococcus) 09/15/2014   Chest pain 08/09/2013  History of TIA (transient ischemic attack) 08/09/2013   Otitis media of left ear 08/09/2013   Lung cancer (HCC) 09/16/2012   Hemorrhoids, external, thrombosed 08/26/2011   BACK PAIN 02/22/2011   COPD (chronic obstructive pulmonary disease) (HCC) 02/06/2011   ROTATOR CUFF TEAR 02/15/2010   ALLERGIC RHINITIS 04/15/2008   .    Objective:   Physical Exam BP 126/80 (BP  Location: Left Arm, Patient Position: Sitting, Cuff Size: Normal)   Pulse 86   Temp (!) 96.9 F (36.1 C) (Oral)   Ht 5\' 9"  (1.753 m)   Wt 169 lb 9.6 oz (76.9 kg)   SpO2 97%   BMI 25.05 kg/m   SpO2: 97 % O2 Device: None (Room air) 2 L per minute with exertion  GENERAL: Chronically ill-appearing man, well-nourished, no acute distress, chronic use of accessories of respiration. Raspy voice.   HEAD: Normocephalic, atraumatic.  EYES: Pupils equal, round, reactive to light.  No scleral icterus.  MOUTH: Nose/mouth/throat not examined due to masking requirements for COVID 19. NECK: Supple. No thyromegaly.  Prior tracheostomy scar. No JVD.  No adenopathy. PULMONARY: Good air entry bilaterally.  Diffuse rhonchi, rare end expiratory wheezes. CARDIOVASCULAR: S1 and S2. Regular rate and rhythm.  No rubs, murmurs or gallops heard. ABDOMEN: Benign. MUSCULOSKELETAL: No joint deformity, no clubbing, no edema.  On limited exam no rashes NEUROLOGIC: No overt focal deficit. SKIN: Intact,warm,dry. PSYCH: Mood depressed and behavior appropriate.     Assessment & Plan:     ICD-10-CM   1. COPD, severe (HCC)  J44.9    Continue DuoNeb/Pulmicort Continue as needed albuterol    2. Chronic respiratory failure with hypoxia (HCC)  J96.11    Continue oxygen at 2 L/min with exertion and sleep Patient compliant    3. 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  Gailen Shelter, MD Advanced Bronchoscopy PCCM [facility]    *This note was dictated using voice recognition software/Dragon.  Despite best efforts to proofread, errors can occur which can change the meaning. Any transcriptional errors that result from this process are unintentional and may not be  fully corrected at the time of dictation.

## 2022-01-31 NOTE — Patient Instructions (Signed)
Your budesonide prescription is up-to-date.  As of November you have 11 refills on it.  I recommend you get Colace over-the-counter this could replace the Senokot and would be gentler on your gut.  You can get pharmacy brand Colace.  For protein supplementation think of the supplement Muscle Milk.  It comes in different flavors and comes either in powder already mix.  Let us see you back in 2 months time call sooner should any new problems arise.

## 2022-03-28 ENCOUNTER — Telehealth: Payer: Self-pay

## 2022-03-28 NOTE — Telephone Encounter (Signed)
1204 pm.  Phone call made to patient to complete a telephonic visit.  No answer.  Message left requesting a call back.  ?

## 2022-04-02 ENCOUNTER — Ambulatory Visit: Payer: Medicare Other | Admitting: Pulmonary Disease

## 2022-04-02 ENCOUNTER — Encounter: Payer: Self-pay | Admitting: Pulmonary Disease

## 2022-04-02 VITALS — BP 110/62 | HR 78 | Temp 98.0°F | Ht 69.0 in | Wt 172.0 lb

## 2022-04-02 DIAGNOSIS — Z87891 Personal history of nicotine dependence: Secondary | ICD-10-CM

## 2022-04-02 DIAGNOSIS — J9611 Chronic respiratory failure with hypoxia: Secondary | ICD-10-CM

## 2022-04-02 DIAGNOSIS — J386 Stenosis of larynx: Secondary | ICD-10-CM

## 2022-04-02 DIAGNOSIS — J449 Chronic obstructive pulmonary disease, unspecified: Secondary | ICD-10-CM

## 2022-04-02 DIAGNOSIS — R0602 Shortness of breath: Secondary | ICD-10-CM

## 2022-04-02 NOTE — Progress Notes (Signed)
Subjective:    Patient ID: Stephen [name], male    DOB: [birth year], 66 y.o.   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  He presents for follow-up on issues with dyspnea.  He was last seen at the office by me on 31 January 2022.  He has a history of COPD GOLD class III. Alpha-1 phenotype MZ and prior right lung lobectomy for lung cancer in 2006.  His current respiratory issues are mostly due to significant glottic stenosis however the patient does not want to undergo tracheostomy which would likely improve his respiratory status.  He states that when he gets short of breath he "panics" and this makes his shortness of breath worse.  This is likely due to his significant glottic stenosis.   He underwent direct laryngoscopy on 08 February 2021 by Dr. Jenne Pane.  This showed that his glottic space is about 50% of normal.  This coupled with his moderate to severe COPD is likely the cause of his severe dyspnea.  Dr. Jenne Pane discussed with him the option of tracheostomy but the patient continued to decline the procedure. I discussed the same issue with him today and he continues to decline this option.   [name] presents today with usual complaint of shortness of breath.  This has not increased over baseline.  He has issues with "panicking" when he gets fatigued or short of breath.  Panicking sensation makes his symptoms worse.  As noted above he is continues to decline tracheostomy which I think would help his breathing significantly.  He has not had any fevers, chills or sweats.  No change in the character of cough or sputum production.  Tessalon Perles help with cough.  Feels that his current bronchodilator therapy is adequate. The  majority of the visit was spent in allowing him to "vent" his frustrations with his breathing.  Despite this he does not endorse any suicidal thought/ideation.   Review of Systems A 10 point review of systems was performed and it is as noted above otherwise negative.  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   Protein-calorie malnutrition, severe 06/25/2018   Esophageal dysmotility 06/24/2018   GERD (gastroesophageal reflux disease) 06/24/2018   History of lung cancer and throat cancer 06/24/2018   FTT (failure to thrive) in adult 06/24/2018   Abnormal echocardiogram 06/24/2018   Hyperlipidemia 05/22/2018   CAD (coronary artery disease)    Elevated troponin 05/04/2018   Acute respiratory failure with hypoxia (HCC) 05/04/2018   Hypothyroid 01/28/2018   Malignant neoplasm of glottis (HCC) 01/13/2018   Dysphonia 12/12/2017   Alpha-1-antitrypsin deficiency carrier 07/17/2016   Eosinophilia 05/30/2016   History of seasonal allergies 05/30/2016   Stopped smoking with greater than 40 pack year history 05/30/2016   History of lobectomy of lung 05/30/2016   History of elevated PSA 07/27/2015   COPD, severe (HCC) 01/12/2015   Research study patient 10/07/2014   Need for prophylactic vaccination against Streptococcus pneumoniae (pneumococcus) 09/15/2014   Chest pain 08/09/2013   History of TIA (transient ischemic attack) 08/09/2013   Otitis media of left ear 08/09/2013  Lung cancer (HCC) 09/16/2012   Hemorrhoids, external, thrombosed 08/26/2011   BACK PAIN 02/22/2011   COPD (chronic obstructive pulmonary disease) (HCC) 02/06/2011   ROTATOR CUFF TEAR 02/15/2010   ALLERGIC RHINITIS 04/15/2008   Social History   Tobacco Use   Smoking status: Former    Current packs/day: 0.00    Average packs/day: 1.5 packs/day for 32.0 years (48.0 ttl  pk-yrs)    Types: Cigarettes    Start date: 12/30/1972    Quit date: 12/30/2004    Years since quitting: 18.7   Smokeless tobacco: Never   Tobacco comments:    1 ppd x 30 years  Substance Use Topics   Alcohol use: Yes    Comment: 1- 2 beers monthly   Allergies  Allergen Reactions   Singulair [Montelukast Sodium]    Codeine Other (See Comments)    Unknown reaction. Mother told him he was allergic   Cymbalta [Duloxetine Hcl] Nausea And Vomiting   Montelukast Sodium Other (See Comments)    Causes sinusitis   Current Meds  Medication Sig   albuterol (PROVENTIL HFA;VENTOLIN HFA) 108 (90 Base) MCG/ACT inhaler Inhale 1-2 puffs into the lungs every 6 (six) hours as needed for wheezing or shortness of breath.   albuterol (PROVENTIL) (2.5 MG/3ML) 0.083% nebulizer solution Take 2.5 mg by nebulization every 6 (six) hours as needed for wheezing or shortness of breath.   ARIPiprazole (ABILIFY) 5 MG tablet Take 5 mg by mouth daily.   aspirin EC 81 MG tablet Take 81 mg by mouth daily.   benzonatate (TESSALON PERLES) 100 MG capsule Take 1 capsule (100 mg total) by mouth 3 (three) times daily as needed for cough.   budesonide (PULMICORT) 0.5 MG/2ML nebulizer solution Take 2 mLs (0.5 mg total) by nebulization in the morning and at bedtime.   ipratropium-albuterol (DUONEB) 0.5-2.5 (3) MG/3ML SOLN Take 3 mLs by nebulization every 6 (six) hours as needed (shortness of breath).   levothyroxine (SYNTHROID, LEVOTHROID) 125 MCG tablet Take 62.5 mcg by mouth daily before breakfast.    LORazepam (ATIVAN) 0.5 MG tablet Take 0.5 mg by mouth every 8 (eight) hours as needed for anxiety.   mirtazapine (REMERON) 30 MG tablet Take 30 mg by mouth at bedtime.   Morphine Sulfate (MORPHINE CONCENTRATE) 10 mg / 0.5 ml concentrated solution Take 5 mg by mouth every 4 (four) hours as needed for severe pain or shortness of breath.   Multiple Vitamin (MULTIVITAMIN WITH MINERALS) TABS tablet Take 1 tablet by mouth daily. Centrum  Silver   nitroGLYCERIN (NITROSTAT) 0.4 MG SL tablet Place 1 tablet (0.4 mg total) under the tongue every 5 (five) minutes as needed for chest pain.   omeprazole (PRILOSEC) 40 MG capsule Take 1 capsule (40 mg total) by mouth daily.   OXYGEN Inhale 2 L/min into the lungs continuous.   pravastatin (PRAVACHOL) 40 MG tablet Take 40 mg by mouth at bedtime.    senna (SENOKOT) 8.6 MG TABS tablet Take 2 tablets by mouth as needed.   temazepam (RESTORIL) 15 MG capsule Take 30 mg by mouth at bedtime.   Vitamin D, Ergocalciferol, (DRISDOL) 50000 UNITS CAPS Take 50,000 Units by mouth every 14 (fourteen) days.    Immunization History  Administered Date(s) Administered   Influenza Inj Mdck Quad Pf 09/23/2019   Influenza Split 09/13/2009, 01/19/2010, 01/22/2011, 09/09/2011, 09/29/2012, 08/02/2013, 08/30/2013, 09/08/2014, 09/06/2015   Influenza Whole 10/01/2010, 10/02/2011, 09/29/2016   Influenza, Quadrivalent, Recombinant, Inj, Pf 09/08/2020, 09/26/2021   Influenza,inj,Quad PF,6+ Mos 09/15/2018   Influenza-Unspecified  08/27/2016, 10/14/2017, 09/10/2020, 09/29/2022   Moderna Sars-Covid-2 Vaccination 03/16/2020, 04/07/2020, 04/13/2020, 12/27/2020, 05/24/2021, 10/02/2021   Pneumococcal Conjugate-13 12/30/2012, 09/08/2014   Pneumococcal Polysaccharide-23 08/05/2007, 10/30/2009, 02/18/2022   Pneumococcal-Unspecified 06/14/2013   Tdap 05/01/2005, 01/22/2011, 01/22/2011   Zoster, Live 09/09/2016      Objective:   Physical Exam BP 110/62 (BP Location: Left Arm, Cuff Size: Normal)   Pulse 78   Temp 98 F (36.7 C) (Temporal)   Ht 5\' 9"  (1.753 m)   Wt 172 lb (78 kg)   SpO2 98%   BMI 25.40 kg/m   SpO2: 98 % O2 Device: None (Room air)  GENERAL: Chronically ill-appearing man, well-nourished, no acute distress, chronic use of accessories of respiration. Raspy voice.   HEAD: Normocephalic, atraumatic.  EYES: Pupils equal, round, reactive to light.  No scleral icterus.  MOUTH: Nose/mouth/throat not  examined due to masking requirements for COVID 19. NECK: Supple. No thyromegaly.  Prior tracheostomy scar. No JVD.  No adenopathy. PULMONARY: Good air entry bilaterally.  Diffuse rhonchi, rare end expiratory wheezes. CARDIOVASCULAR: S1 and S2. Regular rate and rhythm.  No rubs, murmurs or gallops heard. ABDOMEN: Benign. MUSCULOSKELETAL: No joint deformity, no clubbing, no edema.  On limited exam no rashes NEUROLOGIC: No overt focal deficit. SKIN: Intact,warm,dry. PSYCH: Mood depressed and behavior appropriate.       Assessment & Plan:     ICD-10-CM   1. COPD, severe (HCC)  J44.9    Continue DuoNeb/Pulmicort Continue as needed albuterol    2. Chronic respiratory failure with hypoxia (HCC)  J96.11    Continue oxygen at 2 L/min with exertion and sleep Patient compliant with therapy    3. 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 Shortness of breath  R06.02    Multifactorial Glottic stenosis/COPD/deconditioning    5. Former heavy cigarette smoker (20-39 per day)  Z87.891    No evidence of recurrence     Will see the patient in follow-up in 6 months time he is to contact us prior to that time should any new difficulties arise.  Gailen Shelter, MD Advanced Bronchoscopy PCCM [facility]    *This note was dictated using voice recognition software/Dragon.  Despite best efforts to proofread, errors can occur which can change the meaning. Any transcriptional errors that result from this process are unintentional and may not be fully corrected at the time of dictation.

## 2022-04-02 NOTE — Patient Instructions (Signed)
Continue your inhalers and nebulizers as you are doing. ? ?Continue oxygen use. ? ?We will see you in follow-up in 6 months time call sooner should any new problems arise. ?

## 2022-06-06 ENCOUNTER — Other Ambulatory Visit: Payer: Medicare Other

## 2022-06-06 DIAGNOSIS — Z515 Encounter for palliative care: Secondary | ICD-10-CM

## 2022-06-06 NOTE — Progress Notes (Signed)
  Palliative care SW outreached patient to complete telephonic visit.    Palliative care SW outreached patient to complete telephonic visit. Patient provided update on medical condition and/or changes.   Patient endorses that he is doing well. Patient shares that she does have some pain.  Continues to be independent with ADL's and continues to drive.   Hospitalizations: None.  Breathing: patient shares he is concerned about the air quality due to his breathing. Education provided about wearing a KN95 mask if out for long periods of times. Patient shares that he wears a mask. Patient uses nebulizers BID. And has morphine to uses if experiences chest tightness due to breathing, last used 2 weeks ago. Patient on 2LPM PRN.   Appetite: Patients appetite remains poor. No significant weight changes noted. Current weight 169lbs. Patient was told that he did not qualify for MOW's home delivery due to patient still driving.   No medication needs or adjustments noted. Patient is compliant with medications.    Psychosocial assessment: Completed. No financial, food insecurities or issues with transportation noted during call.  Safety: patient now has life alert button.   No other psychosocial needs. Patient expressed frustration around lack of mobility and endurance, feels his quality is poor due to his breathing. SW provided supportive and reflective listening in the form of MI and SFBT. Declines MH services at this time. Patient is lonely and lacks adequate support system.  Patient open to continue to telephonic check in's. Next check in to happen in 6-8 weeks.   Palliative care will continue to monitor and assist with long term care planning as needed.

## 2022-08-15 ENCOUNTER — Encounter: Payer: Self-pay | Admitting: Gastroenterology

## 2022-09-12 ENCOUNTER — Other Ambulatory Visit: Payer: Self-pay | Admitting: Internal Medicine

## 2022-09-12 DIAGNOSIS — R109 Unspecified abdominal pain: Secondary | ICD-10-CM

## 2022-09-13 ENCOUNTER — Ambulatory Visit
Admission: RE | Admit: 2022-09-13 | Discharge: 2022-09-13 | Disposition: A | Discharge status: Home / Self Care | Payer: Medicare Other | Source: Ambulatory Visit | Attending: Internal Medicine | Admitting: Internal Medicine

## 2022-09-13 DIAGNOSIS — R109 Unspecified abdominal pain: Secondary | ICD-10-CM

## 2022-09-13 MED ORDER — IOPAMIDOL (ISOVUE-300) INJECTION 61%
100.0000 mL | Freq: Once | INTRAVENOUS | Status: AC | PRN
  Administered 2022-09-13: 100 mL via INTRAVENOUS

## 2022-10-03 ENCOUNTER — Encounter: Payer: Self-pay | Admitting: Pulmonary Disease

## 2022-10-03 ENCOUNTER — Ambulatory Visit: Payer: Medicare Other | Admitting: Pulmonary Disease

## 2022-10-03 VITALS — BP 110/80 | HR 75 | Temp 97.7°F | Ht 69.0 in | Wt 163.2 lb

## 2022-10-03 DIAGNOSIS — R0602 Shortness of breath: Secondary | ICD-10-CM

## 2022-10-03 DIAGNOSIS — J449 Chronic obstructive pulmonary disease, unspecified: Secondary | ICD-10-CM | POA: Diagnosis not present

## 2022-10-03 DIAGNOSIS — J386 Stenosis of larynx: Secondary | ICD-10-CM | POA: Diagnosis not present

## 2022-10-03 DIAGNOSIS — Z87891 Personal history of nicotine dependence: Secondary | ICD-10-CM | POA: Diagnosis not present

## 2022-10-03 DIAGNOSIS — J9611 Chronic respiratory failure with hypoxia: Secondary | ICD-10-CM | POA: Diagnosis not present

## 2022-10-03 NOTE — Progress Notes (Signed)
Subjective:    Patient ID: Stephen [name], male    DOB: October 14, 1956, 66 y.o.   MRN: [id number] Patient Care Team: Rodrigo Ran, MD as PCP - General (Internal Medicine) Nahser, Deloris Ping, MD as PCP - Cardiology (Cardiology) Sidney Ace, MD as Consulting Physician (Allergy) Si Gaul, MD as Consulting Physician (Hematology and Oncology) Lonie Peak, MD as Attending Physician (Radiation Oncology) Christia Reading, MD as Consulting Physician (Otolaryngology) Reginold Agent, Rocky Crafts, NP as Nurse Practitioner (Hospice and Palliative Medicine) Richrd Sox, LCSW (Inactive) as Social Worker (Licensed Clinical Social Worker) Salena Saner, MD as Consulting Physician (Pulmonary Disease)  Chief Complaint  Patient presents with   Follow-up    COPD. Constant SOB. Occasional wheezing and cough. 2L of O2 as needed.     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  We last saw him on 02 April 2022.  He was relatively stable at that time.  He presents for follow-up on issues with dyspnea, this is a scheduled visit.  He has a history of COPD GOLD class III.  Alpha-1 phenotype MZ prior right lung lobectomy for lung cancer in 2006.  His current respiratory issues are mostly due to significant glottic stenosis however, the patient does not want to undergo tracheostomy which would likely improve his respiratory status.  He states that when he gets short of breath he "panics" and this makes his shortness of breath worse.  This is likely due to his significant glottic stenosis.  Laryngoscopy performed by Dr. Jenne Pane at [facility] on 13 June 2020,showed 50% narrowing of his glottic opening due to his prior surgery.  ENT has recommended tracheostomy but he has been reluctant to commit.  He continues to complain that he cannot do  anything.  I have advised him that though his lung disease is moderate to severe he may find improvement of his respiratory status with tracheostomy.  He does not wish to have this.    He is maintained on DuoNeb and as needed albuterol via nebulizer.  He does not use inhalers due to poor breath-holding capacity due to his glottic issues.  He is on oxygen at 2 L/min with activity and sleep and as needed during "panic".  He has had no fevers, chills or sweats.  No cough or sputum production.  Continues to have dyspnea on exertion.  He has not had any recent exacerbations.     He continues to be abstinent of cigarettes.  Basically leads a sedentary lifestyle.   Review of Systems A 10 point review of systems was performed and it is as noted above otherwise negative.  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   Protein-calorie malnutrition, severe 06/25/2018   Esophageal dysmotility 06/24/2018   GERD (gastroesophageal reflux disease) 06/24/2018   History of lung cancer and throat cancer 06/24/2018   FTT (failure to thrive) in adult 06/24/2018   Abnormal echocardiogram 06/24/2018   Hyperlipidemia 05/22/2018   CAD (coronary artery disease)    Elevated troponin 05/04/2018   Acute respiratory failure with hypoxia 05/04/2018   Hypothyroid 01/28/2018   Malignant neoplasm of glottis 01/13/2018   Dysphonia 12/12/2017   Alpha-1-antitrypsin deficiency carrier 07/17/2016   Eosinophilia 05/30/2016   History of seasonal  allergies 05/30/2016   Stopped smoking with greater than 40 pack year history 05/30/2016   History of lobectomy of lung 05/30/2016   History of elevated PSA 07/27/2015   COPD, severe (HCC) 01/12/2015   Research study patient 10/07/2014   Need for prophylactic vaccination against Streptococcus pneumoniae (pneumococcus) 09/15/2014   Chest  pain 08/09/2013   History of TIA (transient ischemic attack) 08/09/2013   Otitis media of left ear 08/09/2013   Lung cancer (HCC) 09/16/2012   Hemorrhoids, external, thrombosed 08/26/2011   BACK PAIN 02/22/2011   COPD (chronic obstructive pulmonary disease) 02/06/2011   ROTATOR CUFF TEAR 02/15/2010   ALLERGIC RHINITIS 04/15/2008   Social History   Tobacco Use   Smoking status: Former    Packs/day: 1.50    Years: 32.00    Additional pack years: 0.00    Total pack years: 48.00    Types: Cigarettes    Quit date: 12/30/2004    Years since quitting: 18.3   Smokeless tobacco: Never   Tobacco comments:    1 ppd x 30 years  Substance Use Topics   Alcohol use: Yes    Comment: 1- 2 beers monthly   Current Meds  Medication Sig   albuterol (PROVENTIL HFA;VENTOLIN HFA) 108 (90 Base) MCG/ACT inhaler Inhale 1-2 puffs into the lungs every 6 (six) hours as needed for wheezing or shortness of breath.   alendronate (FOSAMAX) 70 MG tablet Take 70 mg by mouth once a week.   ARIPiprazole (ABILIFY) 5 MG tablet Take 5 mg by mouth daily. (Patient not taking: Reported on 12/09/2022)   aspirin EC 81 MG tablet Take 81 mg by mouth daily.   benzonatate (TESSALON PERLES) 100 MG capsule Take 1 capsule (100 mg total) by mouth 3 (three) times daily as needed for cough.   ipratropium-albuterol (DUONEB) 0.5-2.5 (3) MG/3ML SOLN Take 3 mLs by nebulization every 6 (six) hours as needed (shortness of breath).   levothyroxine (SYNTHROID, LEVOTHROID) 125 MCG tablet Take 62.5 mcg by mouth daily before breakfast.    LORazepam (ATIVAN) 0.5 MG tablet Take 0.5 mg by mouth every 8 (eight) hours as needed for anxiety.   mirtazapine (REMERON) 30 MG tablet Take 30 mg by mouth at bedtime. (Patient not taking: Reported on 12/09/2022)   Morphine Sulfate (MORPHINE CONCENTRATE) 10 mg / 0.5 ml concentrated solution Take 5 mg by mouth every 4 (four) hours as needed for severe pain or shortness of breath.   Multiple Vitamin  (MULTIVITAMIN WITH MINERALS) TABS tablet Take 1 tablet by mouth daily. Centrum Silver   nitroGLYCERIN (NITROSTAT) 0.4 MG SL tablet Place 1 tablet (0.4 mg total) under the tongue every 5 (five) minutes as needed for chest pain.   omeprazole (PRILOSEC) 40 MG capsule Take 1 capsule (40 mg total) by mouth daily.   OXYGEN Inhale 2 L/min into the lungs continuous.   pravastatin (PRAVACHOL) 40 MG tablet Take 40 mg by mouth at bedtime.    senna (SENOKOT) 8.6 MG TABS tablet Take 2 tablets by mouth as needed.   temazepam (RESTORIL) 15 MG capsule Take 30 mg by mouth at bedtime.   Vitamin D, Ergocalciferol, (DRISDOL) 50000 UNITS CAPS Take 50,000 Units by mouth every 14 (fourteen) days.    budesonide (PULMICORT) 0.5 MG/2ML nebulizer solution Take 2 mLs (0.5 mg total) by nebulization in the morning and at bedtime.   Immunization History  Administered Date(s) Administered   Influenza Inj Mdck Quad Pf 09/23/2019   Influenza Split 09/13/2009, 01/19/2010, 01/22/2011, 09/09/2011, 09/29/2012, 08/02/2013, 08/30/2013, 09/08/2014,  09/06/2015   Influenza Whole 10/01/2010, 10/02/2011, 09/29/2016   Influenza, Quadrivalent, Recombinant, Inj, Pf 09/08/2020, 09/26/2021   Influenza,inj,Quad PF,6+ Mos 09/15/2018   Influenza-Unspecified 08/27/2016, 10/14/2017, 09/10/2020, 09/29/2022   Moderna Sars-Covid-2 Vaccination 03/16/2020, 04/07/2020, 04/13/2020, 12/27/2020, 05/24/2021, 10/02/2021   Pneumococcal Conjugate-13 12/30/2012, 09/08/2014   Pneumococcal Polysaccharide-23 08/05/2007, 10/30/2009, 02/18/2022   Pneumococcal-Unspecified 06/14/2013   Tdap 05/01/2005, 01/22/2011, 01/22/2011   Zoster, Live 09/09/2016       Objective:   Physical Exam BP 110/80 (BP Location: Left Arm, Cuff Size: Normal)   Pulse 75   Temp 97.7 F (36.5 C)   Ht  (1.753 m)   Wt 163 lb 3.2 oz (74 kg)   SpO2 94%   BMI 24.10 kg/m   SpO2: 94 % O2 Device: None (Room air)  GENERAL: Chronically ill-appearing man, well-nourished, no acute  distress, chronic use of accessories of respiration. Raspy voice.   HEAD: Normocephalic, atraumatic.  EYES: Pupils equal, round, reactive to light.  No scleral icterus.  MOUTH: Poor dentition, oral mucosa moist.  No thrush. NECK: Supple. No thyromegaly.  Prior tracheostomy scar. No JVD.  No adenopathy. PULMONARY: Good air entry bilaterally.  Diffuse rhonchi, rare end expiratory wheezes. CARDIOVASCULAR: S1 and S2. Regular rate and rhythm.  No rubs, murmurs or gallops heard. ABDOMEN: Benign. MUSCULOSKELETAL: No joint deformity, no clubbing, no edema.  On limited exam no rashes NEUROLOGIC: No overt focal deficit. SKIN: Intact,warm,dry. PSYCH: Mood depressed and behavior appropriate.     Assessment & Plan:     ICD-10-CM   1. COPD, severe (HCC)  J44.9    Continue DuoNeb Continue Pulmicort Continue as needed albuterol    2. Chronic respiratory failure with hypoxia (HCC)  J96.11    Continue oxygen between 1 to 2 L/min Compliant with therapy Patient notes benefit of therapy    3. Laryngeal stenosis  J38.6    Main driver of patient's sensation of dyspnea Follows with ENT at [facility] [address] Tracheostomy recommended previously Patient has declined    4. Shortness of breath  R06.02    Mostly is related to glottic stenosis issues Aggravated by COPD    5. Former heavy cigarette smoker (20-39 per day)  Z87.891    No evidence of relapse     The patient appears well compensated.  Continue current regimen.  Will see the patient in follow-up in 6 months time call sooner should any new difficulties arise.  Gailen Shelter, MD [facility]    *This note was dictated using voice recognition software/Dragon.  Despite best efforts to proofread, errors can occur which can change the meaning. Any transcriptional errors that result from this process are unintentional and may not be fully corrected at the time of dictation.

## 2022-10-03 NOTE — Patient Instructions (Signed)
Your lungs sounded very clear today.  You had a good checkup today.  We will see you in 6 months time call sooner should any new problems arise.

## 2022-10-04 ENCOUNTER — Telehealth: Payer: Self-pay | Admitting: Pulmonary Disease

## 2022-10-07 NOTE — Telephone Encounter (Signed)
Called and spoke with patient regarding his prescription for Budesonide nebulizer solution.  Clarified that he should have received 120 ml as he is using 4 mls per day (two 59ml vials daily), 60 ml per month and 120 mls per 60 days.    He said his box did not state 120 ml, it just said 120 and he thought that was supposed to be 120 vials of solution.  He said he received 4 boxes last time he got a refill and this time he only received 2 boxes.  He discussed with the pharmacy and was told then he got 2 boxes free and that [facility] was losing money.  He verbalized understanding after I explained it was in ml and not # of vials.  Nothing further needed.

## 2022-10-31 ENCOUNTER — Other Ambulatory Visit: Payer: Self-pay | Admitting: Primary Care

## 2022-11-06 ENCOUNTER — Telehealth: Payer: Self-pay

## 2022-11-06 NOTE — Telephone Encounter (Signed)
145 pm.  Phone call made to patient to check on overall status and schedule a home visit.  No answer.  Message left requesting a call back.

## 2022-11-12 ENCOUNTER — Other Ambulatory Visit: Payer: Medicare Other

## 2022-11-12 DIAGNOSIS — Z515 Encounter for palliative care: Secondary | ICD-10-CM

## 2022-11-12 NOTE — Progress Notes (Signed)
PATIENT NAME: DAISHAUN AYRE DOB: 14-Aug-1956 MRN: [id number]  PRIMARY CARE PROVIDER: [name], MD  RESPONSIBLE PARTY:  Acct ID - Guarantor Home Phone Work Phone Relationship Acct Type  [id number] - Scherman,[name] [id number]  Self P/F     [place], [place], [place] [address]    I connected with  Lisbeth Ply on 11/12/22 by telephone and verified that I am speaking with the correct person using two identifiers.   I discussed the limitations of evaluation and management by telemedicine. The patient expressed understanding and agreed to proceed.    Spoke with patient over the phone to discuss changes in the [facility] program.   He advises he no longer has an active cancer dx.  Patient continues to drive when needed and is going to scheduled in-person appointments with providers. He is only using oxygen on a prn basis.  Feels overall he has doing well since hospice discharge.   At this time, patient will be discharged from [facility] but patient is aware he can call if there is a decline and he needs services back.         Lorenza Burton, RN

## 2022-12-09 ENCOUNTER — Ambulatory Visit (INDEPENDENT_AMBULATORY_CARE_PROVIDER_SITE_OTHER): Payer: Medicare Other | Admitting: Gastroenterology

## 2022-12-09 ENCOUNTER — Encounter: Payer: Self-pay | Admitting: Gastroenterology

## 2022-12-09 VITALS — BP 120/78 | HR 75 | Ht 69.0 in | Wt 169.4 lb

## 2022-12-09 DIAGNOSIS — R933 Abnormal findings on diagnostic imaging of other parts of digestive tract: Secondary | ICD-10-CM | POA: Diagnosis not present

## 2022-12-09 DIAGNOSIS — Z86010 Personal history of colonic polyps: Secondary | ICD-10-CM

## 2022-12-09 DIAGNOSIS — K219 Gastro-esophageal reflux disease without esophagitis: Secondary | ICD-10-CM | POA: Diagnosis not present

## 2022-12-09 NOTE — Patient Instructions (Signed)
You are scheduled for a Barium enema x-ray examination on 12/12/22 at [facility].   Report to the radiology department at [facility] on 12/12/22 at 9:00 am.  Be prepared to spend 2 to 2 1/2 hours at the Radiology Department  It is very important that you follow the instructions below.  Failure to follow these instructions may result in a study that is less than optimal and may lead to the necessity of repeating the examination.   Barium Enema Prep  Please purchase the following items from the laxative section of your drug store 10 oz.  Magnesium Citrate Bottle 3 Bisacodyl Tablets-Enteric coated 1 Bisacodyl Suppository   Day before exam  12:00 Lunch. This meal may include clear broth, white chicken meat sandwich (no butter, lettuce or other additives), or two hard boiled eggs, strained fruit juices, jello or gelatin ( not containing fruits or nuts).  Coffee or tea (without milk or cream), carbonated beverages.  1:00 pm Drink one full glass or more of water   3:00 pm Drink one full glass or more of water  5:00 pm  Supper.  It is very important that supper be limited to the items described here Broth, Clear jello/gelatin with no whole foods added (no red), soft drinks, gatorade, water black coffee or tea (no milk or cream), popsicles.   No red jello or popsicles.   7:00 pm drink one full glass of water  8:00 pm Drink the bottle of Magnesium Citrate (drink cold if preferred)  10:00 pm  Take three (3) Bisacodyl tablets with one full glass or more of water.   Day of exam  NO breakfast, you may have coffee, tea (without milk or cream), or strained fruit juice 7:00 am unwrap the foil wrapper from bisacodyl suppository and discard the foil.  While lying on your side with thigh elevated, insert the suppository into the rectum and gently push in as far as possible.  Retain the suppository for at least 15 minutes, if possible, before evacuating, even if the urge is strong.  Bowel evacuation usually occurs within 15 to 60 minutes.  Patients requiring assistance should have a bed pan, commode or help readily available.     Due to recent changes in healthcare laws, you may see the results of your imaging and laboratory studies on MyChart before your provider has had a chance to review them.  We understand that in some cases there may be results that are confusing or concerning to you. Not all laboratory results come back in the same time frame and the provider may be waiting for multiple results in order to interpret others.  Please give [place] 48 hours in order for your provider to thoroughly review all the results before contacting the office for clarification of your results.   The [facility] providers would like to encourage you to use [facility] to communicate with providers for non-urgent requests or questions.  Due to long hold times on the telephone, sending your provider a message by [facility] may be a faster and more efficient way to get a response.  Please allow 48 business hours for a response.  Please remember that this is for non-urgent requests.   Thank you for choosing me and [facility].  Pricilla Riffle. Dagoberto Ligas., MD., Mar[facility] Regal

## 2022-12-09 NOTE — Progress Notes (Signed)
Assessment     Abnormal CT of the sigmoid colon with wall thickening, likely related to diverticulosis. 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 After a discussion we will proceed with with an ACBE. MiraLAX qd or Senokot qd as needed for constipation Continue omeprazole 40 mg daily and follow antireflux measures Ongoing follow-up with PCP   HPI    This is a 66 year old male with alternating soft stools and constipation, an abnormal CT of the sigmoid colon and a personal history of adenomatous colon polyps. 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 He note frequent dyspnea.  He has mild chronic constipation.  Several symptoms consistent with GERD which are controlled on current medications.   CT AP 09/14/2022 1. Circumferential bowel wall thickening of the mid to distal sigmoid colon suggestive of colitis. Recommend colonoscopy status post treatment and status post complete resolution of inflammatory changes to exclude an underlying lesion. 2. Colonic diverticulosis with no definite acute diverticulitis. 3. Small hiatal hernia. 4. Aortic Atherosclerosis  Colonoscopy 04/2015 1. Sessile polyp in the transverse colon; polypectomy performed with cold forceps 2. Sessile polyp in the sigmoid colon; polypectomy performed with a cold snare 3. Moderate diverticulosis in the descending colon and sigmoid colon 4. Mild diverticulosis in the transverse colon, ascending colon, and at the  cecum 5. Grade l internal hemorrhoids Path: one adenoma, one hyperplastic   Labs / Imaging       Latest Ref Rng & Units 07/10/2018    2:25 PM 06/09/2017   11:16 AM 12/02/2016    8:57 AM  Hepatic Function  Total Protein 6.5 - 8.1 g/dL 6.5  7.5  7.7   Albumin 3.5 - 5.0 g/dL 3.3  4.1  3.9   AST 15 - 41 U/L _0 ALT 0 - 44 U/L _1 Alk Phosphatase 38 - 126 U/L 104  115  124   Total Bilirubin 0.3 - 1.2 mg/dL 1.0  0.89  1.12        Latest Ref Rng & Units 12/03/2019   12:48 AM 09/26/2018    8:58 AM 07/11/2018    5:06 AM  CBC  WBC 4.0 - 10.5 K/uL 10.2  11.7  7.4   Hemoglobin 13.0 - 17.0 g/dL 13.7  13.5  13.1   Hematocrit 39.0 - 52.0 % 42.1  43.0  43.3   Platelets 150 - 400 K/uL 231  185  264     Current Medications, Allergies, Past Medical History, Past Surgical History, Family History and Social History were reviewed in [employer] record.   Physical Exam: General: Well developed, well nourished, chronically ill-appearing, no acute distress, O2 by [place] Head: Normocephalic and atraumatic Eyes: Sclerae anicteric, EOMI Ears: Normal auditory acuity Mouth: No deformities or lesions noted Lungs: Decreased throughout to auscultation Heart: Regular rate and rhythm; No murmurs, rubs or bruits Abdomen: Soft, non tender and non distended. No masses, hepatosplenomegaly or hernias noted. Normal Bowel sounds Rectal: Deferred to colonoscopy  Musculoskeletal: Symmetrical with no gross deformities  Pulses:  Normal pulses noted Extremities: No edema or deformities noted Neurological: Alert oriented x 4, grossly nonfocal Psychological:  Alert and cooperative. Normal mood and affect   Aamina Skiff T. Fuller Plan, MD 12/09/2022, 1:52 PM

## 2022-12-12 ENCOUNTER — Ambulatory Visit (HOSPITAL_COMMUNITY)
Admission: RE | Admit: 2022-12-12 | Discharge: 2022-12-12 | Disposition: A | Discharge status: Home / Self Care | Payer: Medicare Other | Source: Ambulatory Visit | Attending: Gastroenterology | Admitting: Gastroenterology

## 2022-12-12 ENCOUNTER — Telehealth: Payer: Self-pay

## 2022-12-12 ENCOUNTER — Other Ambulatory Visit: Payer: Self-pay | Admitting: Gastroenterology

## 2022-12-12 ENCOUNTER — Other Ambulatory Visit: Payer: Self-pay

## 2022-12-12 DIAGNOSIS — R933 Abnormal findings on diagnostic imaging of other parts of digestive tract: Secondary | ICD-10-CM | POA: Diagnosis present

## 2022-12-12 DIAGNOSIS — Z860101 Personal history of adenomatous and serrated colon polyps: Secondary | ICD-10-CM

## 2022-12-12 DIAGNOSIS — Z86010 Personal history of colonic polyps: Secondary | ICD-10-CM

## 2022-12-12 NOTE — Telephone Encounter (Signed)
Spoke with patient & he still has a copy of his prep instructions from [place]. He has been advised to repeat prep and return to radiology again tomorrow by 10:30. Pt verbalized all understanding.

## 2022-12-12 NOTE — Telephone Encounter (Signed)
Left message for patient's significant other as well & asked that she have patient call us back.

## 2022-12-12 NOTE — Telephone Encounter (Signed)
Received secure chat from Stephen [name] with Radiology & Dr. Fuller Plan "Good Morning Dr. Fuller Plan. Mr. Washabaugh came for his ACBE this morning. Dr. Armandina Gemma and Dr. Jobe Igo did not feel his prep worked well enough and he had retained stool in his sigmoid. Both would like to know if you would want to continue the prep today with test rescheduled for tomorrow or they also recommended virtual colonoscopy for consideration. Please let me and thank you. Dr. Armandina Gemma can be reached at [phone]."   Per Dr. Fuller Plan, he would like for patient to reprep today using the same bowel prep as before & plan for ACBE tomorrow. He is currently scheduled for tomorrow at 11:00 am, with a 10:30 am arrival. Left message for patient to call back to discuss prep instructions.

## 2022-12-13 ENCOUNTER — Other Ambulatory Visit: Payer: Self-pay | Admitting: Gastroenterology

## 2022-12-13 ENCOUNTER — Ambulatory Visit (HOSPITAL_COMMUNITY)
Admission: RE | Admit: 2022-12-13 | Discharge: 2022-12-13 | Disposition: A | Discharge status: Home / Self Care | Payer: Medicare Other | Source: Ambulatory Visit | Attending: Gastroenterology | Admitting: Gastroenterology

## 2022-12-13 DIAGNOSIS — R933 Abnormal findings on diagnostic imaging of other parts of digestive tract: Secondary | ICD-10-CM | POA: Diagnosis present

## 2022-12-25 ENCOUNTER — Other Ambulatory Visit: Payer: Self-pay

## 2022-12-25 ENCOUNTER — Emergency Department (HOSPITAL_COMMUNITY)
Admission: EM | Admit: 2022-12-25 | Discharge: 2022-12-26 | Disposition: A | Discharge status: Home / Self Care | Payer: Medicare Other | Attending: Emergency Medicine | Admitting: Emergency Medicine

## 2022-12-25 ENCOUNTER — Encounter (HOSPITAL_COMMUNITY): Payer: Self-pay

## 2022-12-25 DIAGNOSIS — F419 Anxiety disorder, unspecified: Secondary | ICD-10-CM | POA: Insufficient documentation

## 2022-12-25 DIAGNOSIS — R000 Tachycardia, unspecified: Secondary | ICD-10-CM | POA: Diagnosis present

## 2022-12-25 DIAGNOSIS — Z5321 Procedure and treatment not carried out due to patient leaving prior to being seen by health care provider: Secondary | ICD-10-CM | POA: Insufficient documentation

## 2022-12-25 DIAGNOSIS — R5383 Other fatigue: Secondary | ICD-10-CM | POA: Insufficient documentation

## 2022-12-25 DIAGNOSIS — R42 Dizziness and giddiness: Secondary | ICD-10-CM | POA: Diagnosis not present

## 2022-12-25 LAB — BASIC METABOLIC PANEL
Anion gap: 7 (ref 5–15)
BUN: 21 mg/dL (ref 8–23)
CO2: 30 mmol/L (ref 22–32)
Calcium: 9.3 mg/dL (ref 8.9–10.3)
Chloride: 98 mmol/L (ref 98–111)
Creatinine, Ser: 1.19 mg/dL (ref 0.61–1.24)
GFR, Estimated: >60 mL/min (ref >60)
Glucose, Bld: 103 mg/dL — ABNORMAL HIGH (ref 70–99)
Potassium: 3.6 mmol/L (ref 3.5–5.1)
Sodium: 135 mmol/L (ref 135–145)

## 2022-12-25 LAB — CBC
HCT: 45.6 % (ref 39.0–52.0)
Hemoglobin: 14.8 g/dL (ref 13.0–17.0)
MCH: 28.3 pg (ref 26.0–34.0)
MCHC: 32.5 g/dL (ref 30.0–36.0)
MCV: 87.2 fL (ref 80.0–100.0)
Platelets: 249 10*3/uL (ref 150–400)
RBC: 5.23 MIL/uL (ref 4.22–5.81)
RDW: 12.5 % (ref 11.5–15.5)
WBC: 23.6 10*3/uL — ABNORMAL HIGH (ref 4.0–10.5)
nRBC: 0 % (ref 0.0–0.2)

## 2022-12-25 NOTE — ED Notes (Addendum)
Pt asking for nebulizer treatment. Pt stating his chest feels tight and oxygen not helping. Pt is on 2L of O2. Oxygen saturation is 100%. Triage RN notified of pt complaint.

## 2022-12-25 NOTE — ED Triage Notes (Signed)
Pt seen at his PCP today. Upon awakening this morning, pt felt generally weak. WBC 27 at PCP today.

## 2022-12-25 NOTE — ED Provider Triage Note (Signed)
Emergency Medicine Provider Triage Evaluation Note  Stephen [name] , a 66 y.o. male  was evaluated in triage.  Pt complains of fast heart rate and abnormal labs. Went to PCP with [facility]. Had WBC of 27. Felt nauseous and was given phenergran. They were concerned due to blood work and tachycardia that he should have more than just x-ray imaging. Hx of lung and laryngeal cancer, s/p lung resection. CXR wasn't well visualized due to lung resection. They wanted him to have a CT scan. Chronically on 2L [place]. Took a lorazepam this morning which helped with his anxiety  Review of Systems  Positive: Fatigue, lightheaded Negative: CP, SOB, cough  Physical Exam  BP 113/60 (BP Location: Left Arm)   Pulse (!) 105   Temp 99 F (37.2 C) (Oral)   Resp (!) 22   Wt 74.4 kg   SpO2 96%   BMI 24.22 kg/m  Gen:   Awake, no distress   Resp:  Normal effort  MSK:   Moves extremities without difficulty  Other:    Medical Decision Making  Medically screening exam initiated at 5:49 PM.  Appropriate orders placed.  ANDRELL BERGESON was informed that the remainder of the evaluation will be completed by another provider, this initial triage assessment does not replace that evaluation, and the importance of remaining in the ED until their evaluation is complete.  Workup initiated   Kateri Plummer, PA-C 12/25/22 1751

## 2022-12-26 ENCOUNTER — Emergency Department (HOSPITAL_COMMUNITY): Payer: Medicare Other

## 2022-12-26 MED ORDER — IOHEXOL 350 MG/ML SOLN
75.0000 mL | Freq: Once | INTRAVENOUS | Status: AC | PRN
  Administered 2022-12-26: 75 mL via INTRAVENOUS

## 2022-12-26 NOTE — ED Notes (Signed)
Pt called x 3 no answer 

## 2022-12-26 NOTE — ED Notes (Signed)
Pt is stating that he's other doctors have instructed him on when to take nebulizer and know when he needs it. The pt the said if they were not given this treatment and waited any long they would leave because no one is giving him treatment.

## 2023-04-17 ENCOUNTER — Encounter: Payer: Self-pay | Admitting: Pulmonary Disease

## 2023-04-17 ENCOUNTER — Ambulatory Visit: Payer: Medicare Other | Admitting: Pulmonary Disease

## 2023-04-17 VITALS — BP 110/74 | HR 86 | Temp 97.7°F | Ht 69.0 in | Wt 166.2 lb

## 2023-04-17 DIAGNOSIS — J9611 Chronic respiratory failure with hypoxia: Secondary | ICD-10-CM | POA: Diagnosis not present

## 2023-04-17 DIAGNOSIS — J386 Stenosis of larynx: Secondary | ICD-10-CM

## 2023-04-17 DIAGNOSIS — Z87891 Personal history of nicotine dependence: Secondary | ICD-10-CM | POA: Diagnosis not present

## 2023-04-17 DIAGNOSIS — J449 Chronic obstructive pulmonary disease, unspecified: Secondary | ICD-10-CM

## 2023-04-17 DIAGNOSIS — R0602 Shortness of breath: Secondary | ICD-10-CM

## 2023-04-17 MED ORDER — BUDESONIDE 0.5 MG/2ML IN SUSP: 0.5000 mg | Freq: Two times a day (BID) | RESPIRATORY_TRACT | 11 refills | Status: DC

## 2023-04-17 MED ORDER — ALBUTEROL SULFATE (2.5 MG/3ML) 0.083% IN NEBU: 2.5000 mg | INHALATION_SOLUTION | Freq: Four times a day (QID) | RESPIRATORY_TRACT | 6 refills | Status: AC | PRN

## 2023-04-17 NOTE — Patient Instructions (Signed)
Your lungs sounded clear today.  Will see you in follow-up in 4 months time call sooner should any new problems arise.

## 2023-04-17 NOTE — Progress Notes (Signed)
Subjective:    Patient ID: Stephen [name], male    DOB: October 21, 1956, 67 y.o.   MRN: [id number] Patient Care Team: Rodrigo Ran, MD as PCP - General (Internal Medicine) Nahser, Deloris Ping, MD as PCP - Cardiology (Cardiology) Sidney Ace, MD as Consulting Physician (Allergy) Si Gaul, MD as Consulting Physician (Hematology and Oncology) Lonie Peak, MD as Attending Physician (Radiation Oncology) Christia Reading, MD as Consulting Physician (Otolaryngology) Reginold Agent, Rocky Crafts, NP as Nurse Practitioner (Hospice and Palliative Medicine) Richrd Sox, LCSW (Inactive) as Social Worker (Licensed Clinical Social Worker) Salena Saner, MD as Consulting Physician (Pulmonary Disease)  Chief Complaint  Patient presents with   Follow-up    SOB with exertion. No wheezing or cough. Pneumonia in Dec and in March.    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  We last saw him on 03 October 2022.  He was relatively stable at that time.  He presents for follow-up on issues with dyspnea, this is a scheduled visit.  He has a history of COPD GOLD class III.  Alpha-1 phenotype MZ prior right lung lobectomy for lung cancer in 2006.  His current respiratory issues are mostly due to significant glottic stenosis however, the patient does not want to undergo tracheostomy which would likely improve his respiratory status.  He states that when he gets short of breath he "panics" and this makes his shortness of breath worse.  This is likely due to his significant glottic stenosis.  Laryngoscopy performed by Dr. Jenne Pane at [facility] on 13 June 2020,showed 50% narrowing of his glottic opening due to his prior surgery.  ENT has recommended tracheostomy but he has been reluctant to commit.  He continues to complain that he cannot  do anything.  I have advised him that though his lung disease is moderate to severe he may find improvement of his respiratory status with tracheostomy.  He does not wish to have this.   He is maintained on DuoNeb and as needed albuterol via nebulizer.  He does not use inhalers due to poor breath-holding capacity due to his glottic issues.  He is on oxygen anywhere between 0.5 to 2 L/min with activity and sleep.  He has had no fevers, chills or sweats.  No cough or sputum production.  Continues to have dyspnea on exertion.  He states that he had "pneumonia" in December and in March, he was given antibiotics and this issue has now resolved.  This was through his primary physician's office.  He continues to be abstinent of cigarettes.  Basically leads a sedentary lifestyle.  He does not endorse any symptomatology today.  Needs refills on albuterol solution and Pulmicort solution.  Review of Systems A 10 point review of systems was performed and it is as noted above otherwise negative.  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   Protein-calorie malnutrition, severe 06/25/2018   Esophageal dysmotility 06/24/2018   GERD (gastroesophageal reflux disease) 06/24/2018   History of lung cancer and throat cancer 06/24/2018   FTT (failure to thrive) in adult 06/24/2018   Abnormal echocardiogram 06/24/2018   Hyperlipidemia 05/22/2018   CAD (coronary artery disease)    Elevated troponin 05/04/2018   Acute respiratory failure with  hypoxia 05/04/2018   Hypothyroid 01/28/2018   Malignant neoplasm of glottis 01/13/2018   Dysphonia 12/12/2017   Alpha-1-antitrypsin deficiency carrier 07/17/2016   Eosinophilia 05/30/2016   History of seasonal allergies 05/30/2016   Stopped smoking with greater than 40 pack year history 05/30/2016   History of lobectomy of  lung 05/30/2016   History of elevated PSA 07/27/2015   COPD, severe (HCC) 01/12/2015   Research study patient 10/07/2014   Need for prophylactic vaccination against Streptococcus pneumoniae (pneumococcus) 09/15/2014   Chest pain 08/09/2013   History of TIA (transient ischemic attack) 08/09/2013   Otitis media of left ear 08/09/2013   Lung cancer (HCC) 09/16/2012   Hemorrhoids, external, thrombosed 08/26/2011   BACK PAIN 02/22/2011   COPD (chronic obstructive pulmonary disease) 02/06/2011   ROTATOR CUFF TEAR 02/15/2010   ALLERGIC RHINITIS 04/15/2008   Social History   Tobacco Use   Smoking status: Former    Packs/day: 1.50    Years: 32.00    Additional pack years: 0.00    Total pack years: 48.00    Types: Cigarettes    Quit date: 12/30/2004    Years since quitting: 18.3   Smokeless tobacco: Never   Tobacco comments:    1 ppd x 30 years  Substance Use Topics   Alcohol use: Yes    Comment: 1- 2 beers monthly   Allergies  Allergen Reactions   Singulair [Montelukast Sodium]    Codeine Other (See Comments)    Unknown reaction. Mother told him he was allergic   Cymbalta [Duloxetine Hcl] Nausea And Vomiting   Montelukast Sodium Other (See Comments)    Causes sinusitis   Current Meds  Medication Sig   albuterol (PROVENTIL HFA;VENTOLIN HFA) 108 (90 Base) MCG/ACT inhaler Inhale 1-2 puffs into the lungs every 6 (six) hours as needed for wheezing or shortness of breath.   alendronate (FOSAMAX) 70 MG tablet Take 70 mg by mouth once a week.   aspirin EC 81 MG tablet Take 81 mg by mouth daily.   benzonatate (TESSALON PERLES) 100 MG capsule Take 1 capsule (100 mg total) by mouth 3 (three) times daily as needed for cough.   ipratropium-albuterol (DUONEB) 0.5-2.5 (3) MG/3ML SOLN Take 3 mLs by nebulization every 6 (six) hours as needed (shortness of breath).   levothyroxine (SYNTHROID, LEVOTHROID) 125 MCG tablet Take 62.5 mcg by mouth daily before breakfast.    LORazepam (ATIVAN) 0.5 MG  tablet Take 0.5 mg by mouth every 8 (eight) hours as needed for anxiety.   Morphine Sulfate (MORPHINE CONCENTRATE) 10 mg / 0.5 ml concentrated solution Take 5 mg by mouth every 4 (four) hours as needed for severe pain or shortness of breath.   Multiple Vitamin (MULTIVITAMIN WITH MINERALS) TABS tablet Take 1 tablet by mouth daily. Centrum Silver   nitroGLYCERIN (NITROSTAT) 0.4 MG SL tablet Place 1 tablet (0.4 mg total) under the tongue every 5 (five) minutes as needed for chest pain.   omeprazole (PRILOSEC) 40 MG capsule Take 1 capsule (40 mg total) by mouth daily.   OXYGEN Inhale 2 L/min into the lungs continuous.   pravastatin (PRAVACHOL) 40 MG tablet Take 40 mg by mouth at bedtime.    senna (SENOKOT) 8.6 MG TABS tablet Take 2 tablets by mouth as needed.   temazepam (RESTORIL) 15 MG capsule Take 30 mg by mouth at bedtime.   Vitamin D, Ergocalciferol, (DRISDOL) 50000 UNITS CAPS Take 50,000 Units by mouth every 14 (fourteen) days.    [DISCONTINUED] albuterol (PROVENTIL) (2.5 MG/3ML) 0.083%  nebulizer solution Take 2.5 mg by nebulization every 6 (six) hours as needed for wheezing or shortness of breath.   [DISCONTINUED] budesonide (PULMICORT) 0.5 MG/2ML nebulizer solution Take 2 mLs (0.5 mg total) by nebulization in the morning and at bedtime.   Immunization History  Administered Date(s) Administered   Influenza Inj Mdck Quad Pf 09/23/2019   Influenza Split 09/13/2009, 01/19/2010, 01/22/2011, 09/09/2011, 09/29/2012, 08/02/2013, 08/30/2013, 09/08/2014, 09/06/2015   Influenza Whole 10/01/2010, 10/02/2011, 09/29/2016   Influenza, Quadrivalent, Recombinant, Inj, Pf 09/08/2020, 09/26/2021   Influenza,inj,Quad PF,6+ Mos 09/15/2018   Influenza-Unspecified 08/27/2016, 10/14/2017, 09/10/2020, 09/29/2022   Moderna Sars-Covid-2 Vaccination 03/16/2020, 04/07/2020, 04/13/2020, 12/27/2020, 05/24/2021, 10/02/2021   Pneumococcal Conjugate-13 12/30/2012, 09/08/2014   Pneumococcal Polysaccharide-23 08/05/2007,  10/30/2009, 02/18/2022   Pneumococcal-Unspecified 06/14/2013   Tdap 05/01/2005, 01/22/2011, 01/22/2011   Zoster, Live 09/09/2016        Objective:   Physical Exam BP 110/74 (BP Location: Left Arm, Cuff Size: Normal)   Pulse 86   Temp 97.7 F (36.5 C)   Ht 5\' 9"  (1.753 m)   Wt 166 lb 3.2 oz (75.4 kg)   SpO2 95%   BMI 24.54 kg/m   SpO2: 95 % O2 Device: Nasal cannula O2 Flow Rate (L/min): 0.5 L/min O2 Type: Continuous O2  GENERAL: Chronically ill-appearing man, well-nourished, no acute distress, chronic use of accessories of respiration. Raspy voice.   HEAD: Normocephalic, atraumatic.  EYES: Pupils equal, round, reactive to light.  No scleral icterus.  MOUTH: Poor dentition, oral mucosa moist.  No thrush. NECK: Supple. No thyromegaly.  Prior tracheostomy scar. No JVD.  No adenopathy. PULMONARY: Good air entry bilaterally.  Diffuse rhonchi, rare end expiratory wheezes. CARDIOVASCULAR: S1 and S2. Regular rate and rhythm.  No rubs, murmurs or gallops heard. ABDOMEN: Benign. MUSCULOSKELETAL: No joint deformity, no clubbing, no edema.  On limited exam no rashes NEUROLOGIC: No overt focal deficit. SKIN: Intact,warm,dry. PSYCH: Mood depressed and behavior appropriate.     Assessment & Plan:     ICD-10-CM   1. COPD, severe  J44.9    Continue DuoNeb and as needed albuterol via nebulizer    2. Chronic respiratory failure with hypoxia  J96.11    Continue oxygen between 1 to 2 L/min Patient compliant with therapy Patient notes benefit of therapy    3. Laryngeal stenosis  J38.6    Follows with ENT at [facility] Tracheostomy has been recommended previously Patient has declined Glottic stenosis leads to sensation of dyspnea    4. Shortness of breath  R06.02    Mostly related to his glottic stenosis Aggravated by COPD    5. Former heavy cigarette smoker (20-39 per day)  Z87.891    No evidence of relapse     Meds ordered this encounter  Medications   albuterol  (PROVENTIL) (2.5 MG/3ML) 0.083% nebulizer solution    Sig: Take 3 mLs (2.5 mg total) by nebulization every 6 (six) hours as needed for wheezing or shortness of breath.    Dispense:  75 mL    Refill:  6   budesonide (PULMICORT) 0.5 MG/2ML nebulizer solution    Sig: Take 2 mLs (0.5 mg total) by nebulization in the morning and at bedtime.    Dispense:  120 mL    Refill:  11   Will see the patient in follow-up in 4 to 6 months time call sooner should any new problems arise.  Gailen Shelter, MD Advanced Bronchoscopy PCCM [facility]    *This note was dictated using voice recognition software/Dragon.  Despite best efforts to proofread, errors can occur which can change the meaning. Any transcriptional errors that result from this process are unintentional and may not be fully corrected at the time of dictation.

## 2023-04-18 ENCOUNTER — Encounter: Payer: Self-pay | Admitting: Pulmonary Disease

## 2023-06-23 ENCOUNTER — Other Ambulatory Visit: Payer: Self-pay | Admitting: Internal Medicine

## 2023-06-23 DIAGNOSIS — K219 Gastro-esophageal reflux disease without esophagitis: Secondary | ICD-10-CM

## 2023-06-23 DIAGNOSIS — R112 Nausea with vomiting, unspecified: Secondary | ICD-10-CM

## 2023-06-24 ENCOUNTER — Ambulatory Visit
Admission: RE | Admit: 2023-06-24 | Discharge: 2023-06-24 | Disposition: A | Discharge status: Home / Self Care | Payer: Medicare Other | Source: Ambulatory Visit | Attending: Internal Medicine | Admitting: Internal Medicine

## 2023-06-24 DIAGNOSIS — K219 Gastro-esophageal reflux disease without esophagitis: Secondary | ICD-10-CM

## 2023-06-24 DIAGNOSIS — R112 Nausea with vomiting, unspecified: Secondary | ICD-10-CM

## 2023-06-24 MED ORDER — IOPAMIDOL (ISOVUE-300) INJECTION 61%
100.0000 mL | Freq: Once | INTRAVENOUS | Status: AC | PRN
  Administered 2023-06-24: 100 mL via INTRAVENOUS

## 2023-07-02 ENCOUNTER — Other Ambulatory Visit: Payer: Self-pay | Admitting: Internal Medicine

## 2023-07-02 DIAGNOSIS — R599 Enlarged lymph nodes, unspecified: Secondary | ICD-10-CM

## 2023-07-04 ENCOUNTER — Encounter (HOSPITAL_BASED_OUTPATIENT_CLINIC_OR_DEPARTMENT_OTHER): Payer: Self-pay

## 2023-07-04 ENCOUNTER — Emergency Department (HOSPITAL_BASED_OUTPATIENT_CLINIC_OR_DEPARTMENT_OTHER): Payer: Medicare Other | Admitting: Radiology

## 2023-07-04 ENCOUNTER — Emergency Department (HOSPITAL_BASED_OUTPATIENT_CLINIC_OR_DEPARTMENT_OTHER)
Admission: EM | Admit: 2023-07-04 | Discharge: 2023-07-04 | Disposition: A | Discharge status: Home / Self Care | Payer: Medicare Other | Attending: Emergency Medicine | Admitting: Emergency Medicine

## 2023-07-04 ENCOUNTER — Emergency Department (HOSPITAL_BASED_OUTPATIENT_CLINIC_OR_DEPARTMENT_OTHER): Payer: Medicare Other

## 2023-07-04 ENCOUNTER — Other Ambulatory Visit: Payer: Self-pay

## 2023-07-04 DIAGNOSIS — J168 Pneumonia due to other specified infectious organisms: Secondary | ICD-10-CM | POA: Diagnosis not present

## 2023-07-04 DIAGNOSIS — J189 Pneumonia, unspecified organism: Secondary | ICD-10-CM

## 2023-07-04 DIAGNOSIS — R1030 Lower abdominal pain, unspecified: Secondary | ICD-10-CM | POA: Insufficient documentation

## 2023-07-04 DIAGNOSIS — R55 Syncope and collapse: Secondary | ICD-10-CM | POA: Insufficient documentation

## 2023-07-04 DIAGNOSIS — R110 Nausea: Secondary | ICD-10-CM | POA: Diagnosis present

## 2023-07-04 DIAGNOSIS — Z7982 Long term (current) use of aspirin: Secondary | ICD-10-CM | POA: Diagnosis not present

## 2023-07-04 DIAGNOSIS — W1839XA Other fall on same level, initial encounter: Secondary | ICD-10-CM | POA: Insufficient documentation

## 2023-07-04 LAB — HEPATIC FUNCTION PANEL
ALT: 16 U/L (ref 0–44)
AST: 18 U/L (ref 15–41)
Albumin: 4.4 g/dL (ref 3.5–5.0)
Alkaline Phosphatase: 91 U/L (ref 38–126)
Bilirubin, Direct: 0.5 mg/dL — ABNORMAL HIGH (ref 0.0–0.2)
Indirect Bilirubin: 1.4 mg/dL — ABNORMAL HIGH (ref 0.3–0.9)
Total Bilirubin: 1.9 mg/dL — ABNORMAL HIGH (ref 0.3–1.2)
Total Protein: 7.6 g/dL (ref 6.5–8.1)

## 2023-07-04 LAB — BASIC METABOLIC PANEL
Anion gap: 9 (ref 5–15)
BUN: 16 mg/dL (ref 8–23)
CO2: 27 mmol/L (ref 22–32)
Calcium: 9.1 mg/dL (ref 8.9–10.3)
Chloride: 96 mmol/L — ABNORMAL LOW (ref 98–111)
Creatinine, Ser: 0.81 mg/dL (ref 0.61–1.24)
GFR, Estimated: >60 mL/min (ref >60)
Glucose, Bld: 112 mg/dL — ABNORMAL HIGH (ref 70–99)
Potassium: 3.5 mmol/L (ref 3.5–5.1)
Sodium: 132 mmol/L — ABNORMAL LOW (ref 135–145)

## 2023-07-04 LAB — URINALYSIS, ROUTINE W REFLEX MICROSCOPIC
Glucose, UA: NEGATIVE mg/dL
Ketones, ur: 40 mg/dL — AB
Leukocytes,Ua: NEGATIVE
Nitrite: NEGATIVE
Protein, ur: 100 mg/dL — AB
Specific Gravity, Urine: 1.02 (ref 1.005–1.030)
pH: 6 (ref 5.0–8.0)

## 2023-07-04 LAB — CBC
HCT: 39.7 % (ref 39.0–52.0)
Hemoglobin: 13.6 g/dL (ref 13.0–17.0)
MCH: 28.9 pg (ref 26.0–34.0)
MCHC: 34.3 g/dL (ref 30.0–36.0)
MCV: 84.5 fL (ref 80.0–100.0)
Platelets: 236 10*3/uL (ref 150–400)
RBC: 4.7 MIL/uL (ref 4.22–5.81)
RDW: 12.6 % (ref 11.5–15.5)
WBC: 20.8 10*3/uL — ABNORMAL HIGH (ref 4.0–10.5)
nRBC: 0 % (ref 0.0–0.2)

## 2023-07-04 LAB — URINALYSIS, MICROSCOPIC (REFLEX)

## 2023-07-04 LAB — LACTIC ACID, PLASMA: Lactic Acid, Venous: 1.6 mmol/L (ref 0.5–1.9)

## 2023-07-04 LAB — LIPASE, BLOOD: Lipase: 13 U/L (ref 11–51)

## 2023-07-04 MED ORDER — KETOROLAC TROMETHAMINE 30 MG/ML IJ SOLN
30.0000 mg | Freq: Once | INTRAMUSCULAR | Status: AC
  Administered 2023-07-04: 30 mg via INTRAVENOUS
  Filled 2023-07-04: qty 1

## 2023-07-04 MED ORDER — DOXYCYCLINE HYCLATE 100 MG PO CAPS: 100.0000 mg | ORAL_CAPSULE | Freq: Two times a day (BID) | ORAL | 0 refills | Status: AC

## 2023-07-04 MED ORDER — SODIUM CHLORIDE 0.9 % IV BOLUS
1000.0000 mL | Freq: Once | INTRAVENOUS | Status: AC
  Administered 2023-07-04: 1000 mL via INTRAVENOUS

## 2023-07-04 MED ORDER — SODIUM CHLORIDE 0.9 % IV SOLN
2.0000 g | Freq: Once | INTRAVENOUS | Status: AC
  Administered 2023-07-04: 2 g via INTRAVENOUS
  Filled 2023-07-04: qty 20

## 2023-07-04 MED ORDER — DOXYCYCLINE HYCLATE 100 MG PO TABS
100.0000 mg | ORAL_TABLET | Freq: Once | ORAL | Status: AC
  Administered 2023-07-04: 100 mg via ORAL
  Filled 2023-07-04: qty 1

## 2023-07-04 MED ORDER — IOHEXOL 300 MG/ML  SOLN
100.0000 mL | Freq: Once | INTRAMUSCULAR | Status: AC | PRN
  Administered 2023-07-04: 75 mL via INTRAVENOUS

## 2023-07-04 NOTE — ED Notes (Signed)
RN reviewed discharge instructions with pt. Pt verbalized understanding and had no further questions. VSS upon discharge.  

## 2023-07-04 NOTE — ED Notes (Signed)
Patient transported to CT 

## 2023-07-04 NOTE — ED Triage Notes (Signed)
Pt to er, pt states that a couple of weeks ago he was told that he had gallstones, states that Wednesday he fell when taking his dog out, states that he was in the kitchen got hot and dizzy and collapsed.  States that since then he has been laying in the bed and having pain all over.

## 2023-07-04 NOTE — ED Provider Notes (Signed)
[place] [facility]ERGENCY DEPARTMENT AT [facility] Provider Note   CSN: [id number] Arrival date & time: 07/04/23  1715     History  Chief Complaint  Patient presents with   Loss of Consciousness    Stephen [name] is a 67 y.o. male presenting to the ER with a constellation of symptoms.  Patient reports that he was told that he has gallstones about 2 weeks ago on a CT scan of the abdomen.  He states that 2 days ago he began to feel hot and dizzy in the kitchen and had a loss of consciousness episode, fell down to the ground, is now having pain across his lower abdomen.  Reports nausea and loss of appetite.  No constipation or diarrhea.  He has COPD is on chronic oxygen  CT 06/24/23: MPRESSION: 1. Cholelithiasis. Single large gallstone has migrated slightly closer to the gallbladder neck compared to 09/13/2022. If there is clinical concern for acute cholecystitis, consider further evaluation with right upper quadrant ultrasound examination. 2. Increased size of 1.0 cm lower paraesophageal lymph node, previously 0.5 cm, indeterminate. Recommend attention on follow-up. 3. Unchanged ill-defined lucency along the anterior vertebral body of L1. 4. Aortic Atherosclerosis (ICD10-I70.0) and Emphysema (ICD10-J43.9).    HPI     Home Medications Prior to Admission medications   Medication Sig Start Date End Date Taking? Authorizing Provider  doxycycline (VIBRAMYCIN) 100 MG capsule Take 1 capsule (100 mg total) by mouth 2 (two) times daily for 7 days. 07/05/23 07/12/23 Yes Shenise Wolgamott, Kermit Balo, MD  albuterol (PROVENTIL HFA;VENTOLIN HFA) 108 (90 Base) MCG/ACT inhaler Inhale 1-2 puffs into the lungs every 6 (six) hours as needed for wheezing or shortness of breath.    [provider]  albuterol (PROVENTIL) (2.5 MG/3ML) 0.083% nebulizer solution Take 3 mLs (2.5 mg total) by nebulization every 6 (six) hours as needed for wheezing or shortness of breath. 04/17/23   Salena Saner, MD   alendronate (FOSAMAX) 70 MG tablet Take 70 mg by mouth once a week. 08/19/22   [provider]  ARIPiprazole (ABILIFY) 5 MG tablet Take 5 mg by mouth daily. Patient not taking: Reported on 12/09/2022 06/01/20   [provider]  aspirin EC 81 MG tablet Take 81 mg by mouth daily.    [provider]  benzonatate (TESSALON PERLES) 100 MG capsule Take 1 capsule (100 mg total) by mouth 3 (three) times daily as needed for cough. 05/01/21   Salena Saner, MD  budesonide (PULMICORT) 0.5 MG/2ML nebulizer solution Take 2 mLs (0.5 mg total) by nebulization in the morning and at bedtime. 04/17/23   Salena Saner, MD  ipratropium-albuterol (DUONEB) 0.5-2.5 (3) MG/3ML SOLN Take 3 mLs by nebulization every 6 (six) hours as needed (shortness of breath). 02/14/20   Salena Saner, MD  lansoprazole (PREVACID) 30 MG capsule Take 1 capsule (30 mg total) by mouth daily. 07/10/20 10/31/21  Salena Saner, MD  levothyroxine (SYNTHROID, LEVOTHROID) 125 MCG tablet Take 62.5 mcg by mouth daily before breakfast.     [provider]  LORazepam (ATIVAN) 0.5 MG tablet Take 0.5 mg by mouth every 8 (eight) hours as needed for anxiety.    [provider]  mirtazapine (REMERON) 30 MG tablet Take 30 mg by mouth at bedtime. Patient not taking: Reported on 12/09/2022 07/26/20   [provider]  Morphine Sulfate (MORPHINE CONCENTRATE) 10 mg / 0.5 ml concentrated solution Take 5 mg by mouth every 4 (four) hours as needed for severe pain  or shortness of breath.    [provider]  Multiple Vitamin (MULTIVITAMIN WITH MINERALS) TABS tablet Take 1 tablet by mouth daily. Centrum Silver    [provider]  nitroGLYCERIN (NITROSTAT) 0.4 MG SL tablet Place 1 tablet (0.4 mg total) under the tongue every 5 (five) minutes as needed for chest pain. 08/09/13   Christiane Ha, MD  omeprazole (PRILOSEC) 40 MG capsule Take 1 capsule (40 mg total) by mouth daily. 04/08/18    Meryl Dare, MD  OXYGEN Inhale 2 L/min into the lungs continuous.    [provider]  pravastatin (PRAVACHOL) 40 MG tablet Take 40 mg by mouth at bedtime.     [provider]  senna (SENOKOT) 8.6 MG TABS tablet Take 2 tablets by mouth as needed. 06/01/20   [provider]  temazepam (RESTORIL) 15 MG capsule Take 30 mg by mouth at bedtime.    [provider]  Vitamin D, Ergocalciferol, (DRISDOL) 50000 UNITS CAPS Take 50,000 Units by mouth every 14 (fourteen) days.  05/07/11   [provider]      Allergies    Singulair [montelukast sodium], Codeine, Cymbalta [duloxetine hcl], and Montelukast sodium    Review of Systems   Review of Systems  Physical Exam Updated Vital Signs BP 129/88   Pulse 75   Temp 98.2 F (36.8 C)   Resp (!) 21   Ht 5\' 9"  (1.753 m)   Wt 72.6 kg   SpO2 99%   BMI 23.63 kg/m  Physical Exam Constitutional:      General: He is not in acute distress. HENT:     Head: Normocephalic and atraumatic.  Eyes:     Conjunctiva/sclera: Conjunctivae normal.     Pupils: Pupils are equal, round, and reactive to light.  Cardiovascular:     Rate and Rhythm: Normal rate and regular rhythm.  Pulmonary:     Effort: Pulmonary effort is normal. No respiratory distress.     Comments: On home supplemental O2 Abdominal:     General: There is no distension.     Tenderness: There is abdominal tenderness in the right upper quadrant and epigastric area.  Skin:    General: Skin is warm and dry.  Neurological:     General: No focal deficit present.     Mental Status: He is alert. Mental status is at baseline.  Psychiatric:        Mood and Affect: Mood normal.        Behavior: Behavior normal.     ED Results / Procedures / Treatments   Labs (all labs ordered are listed, but only abnormal results are displayed) Labs Reviewed  BASIC METABOLIC PANEL - Abnormal; Notable for the following components:      Result Value   Sodium 132 (*)     Chloride 96 (*)    Glucose, Bld 112 (*)    All other components within normal limits  CBC - Abnormal; Notable for the following components:   WBC 20.8 (*)    All other components within normal limits  URINALYSIS, ROUTINE W REFLEX MICROSCOPIC - Abnormal; Notable for the following components:   Color, Urine AMBER (*)    APPearance HAZY (*)    Hgb urine dipstick SMALL (*)    Bilirubin Urine MODERATE (*)    Ketones, ur 40 (*)    Protein, ur 100 (*)    All other components within normal limits  HEPATIC FUNCTION PANEL - Abnormal; Notable for the following components:  Total Bilirubin 1.9 (*)    Bilirubin, Direct 0.5 (*)    Indirect Bilirubin 1.4 (*)    All other components within normal limits  URINALYSIS, MICROSCOPIC (REFLEX) - Abnormal; Notable for the following components:   Bacteria, UA RARE (*)    All other components within normal limits  CULTURE, BLOOD (ROUTINE X 2)  CULTURE, BLOOD (ROUTINE X 2)  LIPASE, BLOOD  LACTIC ACID, PLASMA    EKG EKG Interpretation Date/Time:  Friday July 04 2023 17:28:59 EDT Ventricular Rate:  101 PR Interval:  160 QRS Duration:  144 QT Interval:  364 QTC Calculation: 471 R Axis:   -61  Text Interpretation: Sinus tachycardia Right atrial enlargement Right bundle branch block Left anterior fascicular block Lateral infarct , age undetermined Cannot rule out Inferior infarct , age undetermined Abnormal ECG When compared with ECG of 02-Dec-2019 19:14, Left anterior fascicular block is now Present Lateral infarct is now Present Confirmed by Alvester Chou [phone]) on 07/04/2023 6:18:53 PM  Radiology CT ABDOMEN PELVIS W CONTRAST  Result Date: 07/04/2023 CLINICAL DATA:  Sepsis. EXAM: CT ABDOMEN AND PELVIS WITH CONTRAST TECHNIQUE: Multidetector CT imaging of the abdomen and pelvis was performed using the standard protocol following bolus administration of intravenous contrast. RADIATION DOSE REDUCTION: This exam was performed according to the  departmental dose-optimization program which includes automated exposure control, adjustment of the mA and/or kV according to patient size and/or use of iterative reconstruction technique. CONTRAST:  75mL OMNIPAQUE IOHEXOL 300 MG/ML  SOLN COMPARISON:  CT abdomen 06/24/2023. CT abdomen and pelvis 09/13/2022. FINDINGS: Lower chest: There is new airspace disease in the right lower lobe with numerous ill-defined nodular densities measuring up 2 9 mm. Emphysematous changes are again seen in the right middle lobe. There is a small pericardial effusion, unchanged. Hepatobiliary: Gallstones are again seen. There is no biliary ductal dilatation or surrounding inflammation. No focal liver lesions are identified. Pancreas: Unremarkable. No pancreatic ductal dilatation or surrounding inflammatory changes. Spleen: Normal in size without focal abnormality. Adrenals/Urinary Tract: Adrenal glands are unremarkable. Kidneys are normal, without renal calculi, focal lesion, or hydronephrosis. Bladder is unremarkable. Stomach/Bowel: Small hiatal hernia is present. Stomach is otherwise within normal limits. Appendix appears normal. No evidence of bowel wall thickening, distention, or inflammatory changes. There is sigmoid colon diverticulosis. Vascular/Lymphatic: 1 cm paraesophageal lymph node is unchanged. No new enlarged lymph nodes are identified. Aorta is normal in size. Reproductive: Prostate gland is mildly enlarged. Other: There is a small fat containing left inguinal hernia. There is no ascites. Musculoskeletal: No acute or significant osseous findings. IMPRESSION: 1. New airspace disease in the right lower lobe with numerous ill-defined nodular densities measuring up to 9 mm. Findings are favored as infectious/inflammatory. 2. No acute localizing process in the abdomen or pelvis. 3. Cholelithiasis. 4. Small hiatal hernia. 5. Sigmoid colon diverticulosis. 6. Stable small pericardial effusion. 7. Stable mildly enlarged  paraesophageal lymph node. Electronically Signed   By: Darliss Cheney M.D.   On: 07/04/2023 20:46   US Abdomen Limited RUQ (LIVER/GB)  Result Date: 07/04/2023 CLINICAL DATA:  Right upper quadrant pain EXAM: ULTRASOUND ABDOMEN LIMITED RIGHT UPPER QUADRANT COMPARISON:  CT 06/24/2023 FINDINGS: Gallbladder: 1.5 cm non mobile gallstone in the gallbladder neck. This is unchanged since prior CT. No wall thickening or sonographic Murphy sign. Common bile duct: Diameter: Normal caliber, 2 mm Liver: No focal lesion identified. Within normal limits in parenchymal echogenicity. Portal vein is patent on color Doppler imaging with normal direction of blood flow towards the liver.  Other: None. IMPRESSION: 1.5 cm gallstone lodged in the gallbladder neck. No sonographic evidence of acute cholecystitis. No change since recent CT. Electronically Signed   By: Charlett Nose M.D.   On: 07/04/2023 19:29   DG Chest 2 View  Result Date: 07/04/2023 CLINICAL DATA:  Productive cough EXAM: CHEST - 2 VIEW COMPARISON:  12/02/2019 FINDINGS: Cardiac shadow is stable. Left lung is clear and mildly hyperinflated. Right lung demonstrates considerable apical scarring and postsurgical changes stable from the prior exam. No new focal abnormality is noted. IMPRESSION: Chronic changes on the right without acute abnormality. Electronically Signed   By: Alcide Clever M.D.   On: 07/04/2023 19:01    Procedures .Critical Care  Performed by: Terald Sleeper, MD Authorized by: Terald Sleeper, MD   Critical care provider statement:    Critical care time (minutes):  45   Critical care time was exclusive of:  Separately billable procedures and treating other patients   Critical care was necessary to treat or prevent imminent or life-threatening deterioration of the following conditions:  Sepsis   Critical care was time spent personally by me on the following activities:  Ordering and performing treatments and interventions, ordering and review of  laboratory studies, ordering and review of radiographic studies, pulse oximetry, review of old charts, examination of patient and evaluation of patient's response to treatment     Medications Ordered in ED Medications  ketorolac (TORADOL) 30 MG/ML injection 30 mg (30 mg Intravenous Given 07/04/23 1901)  sodium chloride 0.9 % bolus 1,000 mL (0 mLs Intravenous Stopped 07/04/23 2050)  iohexol (OMNIPAQUE) 300 MG/ML solution 100 mL (75 mLs Intravenous Contrast Given 07/04/23 2028)  cefTRIAXone (ROCEPHIN) 2 g in sodium chloride 0.9 % 100 mL IVPB (0 g Intravenous Stopped 07/04/23 2232)  doxycycline (VIBRA-TABS) tablet 100 mg (100 mg Oral Given 07/04/23 2155)    ED Course/ Medical Decision Making/ A&P Clinical Course as of 07/04/23 2311  Fri Jul 04, 2023  2138 I reassessed the patient discussed his diagnosis with him.  I am recommending hospitalization given his comorbidities, including his underlying lung disease, as well as general fatigue.  I explained that he would also need a sepsis rule out.  The patient however is refusing hospitalization at this time.  He states that he is only caretaker for his dog at home and needs to go home to take care of his dog.  I encouraged him to return to the hospital soon as possible.  He understands that he is leaving AGAINST MEDICAL ADVICE, and assuming the risk of worsening medical condition, respiratory failure and death.  He reports he has no one else, no family members, neighbors or friends to care for his dog tonight, and he is not staying [MT]    Clinical Course User Index [MT] Tamikka Pilger, Kermit Balo, MD                             Medical Decision Making Amount and/or Complexity of Data Reviewed Labs: ordered. Radiology: ordered.  Risk Prescription drug management.   This patient presents to the ED with concern for abdominal pain, loss of consciousness. This involves an extensive number of treatment options, and is a complaint that carries with it a high risk of  complications and morbidity.  The differential diagnosis includes likely vasovagal episode, versus infection, versus anemia, versus other.  Abdominal pain may be related to acute biliary disease, with known gallstone disease noted from  recent imaging  External records from outside source obtained and reviewed including CT of the abdomen pelvis from 2 weeks ago as noted above  I ordered and personally interpreted labs.  The pertinent results include: White blood cell count elevated 28.8.  Lactate within normal limits.  UA without evidence of infection  I ordered imaging studies including x-ray of the chest, CT abdomen pelvis, right upper quadrant ultrasound I independently visualized and interpreted imaging which showed concern for developing lower lobe pneumonia; gallstones without any other clear stigmata of acute cholecystitis I agree with the radiologist interpretation  The patient was maintained on a cardiac monitor.  I personally viewed and interpreted the cardiac monitored which showed an underlying rhythm of: Sinus rhythm  Per my interpretation the patient's ECG shows no acute ischemic findings  I ordered medication including IV Rocephin and doxycycline for community pneumonia.  IV fluids for hypotension and infection protocol  I have reviewed the patients home medicines and have made adjustments as needed  Test Considered: Testicular torsion or epididymitis, no indication for ultrasound of the scrotum at this time   After the interventions noted above, I reevaluated the patient and found that they have: improved -blood pressure improved  Dispostion:  After consideration of the diagnostic results and the patients response to treatment, I feel that the patent would benefit from hospitalization.  However, the patient opted instead to leave AGAINST MEDICAL ADVICE.  He was encouraged to return to soon as possible.  Please see ED course and discharge summary.         Final  Clinical Impression(s) / ED Diagnoses Final diagnoses:  Pneumonia due to infectious organism, unspecified laterality, unspecified part of lung    Rx / DC Orders ED Discharge Orders          Ordered    doxycycline (VIBRAMYCIN) 100 MG capsule  2 times daily        07/04/23 2140              Terald Sleeper, MD 07/04/23 2311

## 2023-07-04 NOTE — Discharge Instructions (Addendum)
You are diagnosed with pneumonia today.  We recommended that you stay in the hospital for IV antibiotics, and also to rule out a potential blood infection.  You decided not to stay in the hospital and to leave AGAINST MEDICAL ADVICE.  I would encourage you to return to the hospital as soon as possible to complete your treatment.  If you begin having worsening pain, difficulty breathing, lightheadedness, please call 911 to return to the hospital.  I did prescribe you additional antibiotics to your pharmacy.  Please pick these up and begin taking them tomorrow.

## 2023-07-06 LAB — CULTURE, BLOOD (ROUTINE X 2)

## 2023-07-07 LAB — CULTURE, BLOOD (ROUTINE X 2): Culture: NO GROWTH

## 2023-07-08 LAB — CULTURE, BLOOD (ROUTINE X 2): Special Requests: ADEQUATE

## 2023-07-21 ENCOUNTER — Ambulatory Visit: Payer: Self-pay | Admitting: Surgery

## 2023-08-22 ENCOUNTER — Encounter: Payer: Self-pay | Admitting: Pulmonary Disease

## 2023-09-28 ENCOUNTER — Encounter: Payer: Self-pay | Admitting: Pulmonary Disease

## 2023-09-29 ENCOUNTER — Encounter: Payer: Self-pay | Admitting: Pulmonary Disease

## 2023-09-29 ENCOUNTER — Ambulatory Visit: Payer: Medicare Other | Admitting: Pulmonary Disease

## 2023-09-29 VITALS — BP 106/66 | HR 77 | Temp 97.8°F | Ht 69.0 in | Wt 163.2 lb

## 2023-09-29 DIAGNOSIS — J9611 Chronic respiratory failure with hypoxia: Secondary | ICD-10-CM

## 2023-09-29 DIAGNOSIS — E8801 Alpha-1-antitrypsin deficiency: Secondary | ICD-10-CM

## 2023-09-29 DIAGNOSIS — Z87891 Personal history of nicotine dependence: Secondary | ICD-10-CM

## 2023-09-29 DIAGNOSIS — J386 Stenosis of larynx: Secondary | ICD-10-CM | POA: Diagnosis not present

## 2023-09-29 DIAGNOSIS — J449 Chronic obstructive pulmonary disease, unspecified: Secondary | ICD-10-CM | POA: Diagnosis not present

## 2023-09-29 DIAGNOSIS — R0602 Shortness of breath: Secondary | ICD-10-CM

## 2023-09-29 MED ORDER — IPRATROPIUM-ALBUTEROL 0.5-2.5 (3) MG/3ML IN SOLN: 3.0000 mL | Freq: Four times a day (QID) | RESPIRATORY_TRACT | 11 refills | Status: DC | PRN

## 2023-09-29 NOTE — Progress Notes (Signed)
Subjective:    Patient ID: Stephen [name], male    DOB: [birth year], 67 y.o.   MRN: [id number]  Patient Care Team: Rodrigo Ran, MD as PCP - General (Internal Medicine) Nahser, Deloris Ping, MD as PCP - Cardiology (Cardiology) Sidney Ace, MD as Consulting Physician (Allergy) Si Gaul, MD as Consulting Physician (Hematology and Oncology) Lonie Peak, MD as Attending Physician (Radiation Oncology) Christia Reading, MD as Consulting Physician (Otolaryngology) Gusler, Rocky Crafts, NP as Nurse Practitioner (Hospice and Palliative Medicine) Richrd Sox, LCSW (Inactive) as Social Worker (Licensed Clinical Social Worker) Salena Saner, MD as Consulting Physician (Pulmonary Disease)  Chief Complaint  Patient presents with   Follow-up    HPI This is a 67 year old very complex former smoker (quit 2006), laryngeal cancer in 2007 with subsequent vertical partial laryngectomy in 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  We last saw him on 03 October 2022.  He was relatively stable at that time.  He presents for follow-up on issues with dyspnea, this is a scheduled visit.  He has a history of COPD GOLD class III, Alpha-1 phenotype MZ ([employer] January 2012, normal alpha-1 level at that time) and prior right lung lobectomy for lung cancer in 2006.  His current respiratory issues are mostly due to significant glottic stenosis however, the patient does not want to undergo tracheostomy which would likely improve his respiratory status.  He states that when he gets short of breath he "panics" and this makes his shortness of breath worse.  This is likely due to his significant glottic stenosis.  Laryngoscopy performed by Dr. Karlyn Agee, on 13 June 2020,showed 50% narrowing of his glottic opening due to his prior surgery.  ENT has recommended tracheostomy but he has been reluctant to commit.  He continues to complain that he cannot do  anything.  I have advised him that though his lung disease is moderate to severe he may find improvement of his respiratory status with tracheostomy.  He does not wish to have this.    He is maintained on DuoNeb and as needed albuterol via nebulizer.  He does not use inhalers due to poor breath-holding capacity due to his glottic issues.  He is on oxygen anywhere between 0.5 to 2 L/min with activity and sleep.  He has had no fevers, chills or sweats.  No cough or sputum production.  Continues to have dyspnea on exertion.    He had some issues with his gallbladder in June and a CT abdomen with contrast was performed showing a large gallstone in the gallbladder.  Of note the visible lung windows showed no active disease a follow-up CT abdomen and pelvis with contrast was performed on 5 July and this showed right lower lobe infiltrates likely reflecting aspiration.  Stones were again seen.  This was through his primary physician's office.  This scan was performed because he had an episode of loss of consciousness and was seen in the emergency room.  He did receive doxycycline at that time.  He has not had imaging following this.  He has an upcoming CT abdomen scheduled by Dr. Waynard Edwards, his primary care physician.   He continues to be abstinent of cigarettes.  Basically leads a sedentary lifestyle.   He does not endorse any new symptomatology today.  Needs refills on DuoNeb solution.   Review of Systems A 10 point review of systems was performed and it is as noted above otherwise negative.   Patient Active Problem  List   Diagnosis Date Noted   Pharyngeal dysphagia 10/20/2020   Shortness of breath 07/27/2019   Palliative care encounter 06/28/2019   Laryngeal stenosis 07/16/2018   Protein-calorie malnutrition, severe 06/25/2018   Esophageal dysmotility 06/24/2018   GERD (gastroesophageal reflux disease) 06/24/2018   History of lung cancer and throat cancer 06/24/2018   FTT (failure to thrive) in  adult 06/24/2018   Abnormal echocardiogram 06/24/2018   Hyperlipidemia 05/22/2018   CAD (coronary artery disease)    Elevated troponin 05/04/2018   Acute respiratory failure with hypoxia (HCC) 05/04/2018   Hypothyroid 01/28/2018   Malignant neoplasm of glottis (HCC) 01/13/2018   Dysphonia 12/12/2017   Alpha-1-antitrypsin deficiency carrier 07/17/2016   Eosinophilia 05/30/2016   History of seasonal allergies 05/30/2016   Stopped smoking with greater than 40 pack year history 05/30/2016   History of lobectomy of lung 05/30/2016   History of elevated PSA 07/27/2015   COPD, severe (HCC) 01/12/2015   Research study patient 10/07/2014   Need for prophylactic vaccination against Streptococcus pneumoniae (pneumococcus) 09/15/2014   Chest pain 08/09/2013   History of TIA (transient ischemic attack) 08/09/2013   Otitis media of left ear 08/09/2013   Lung cancer (HCC) 09/16/2012   Hemorrhoids, external, thrombosed 08/26/2011   BACK PAIN 02/22/2011   COPD (chronic obstructive pulmonary disease) (HCC) 02/06/2011   ROTATOR CUFF TEAR 02/15/2010   ALLERGIC RHINITIS 04/15/2008    Social History   Tobacco Use   Smoking status: Former    Current packs/day: 0.00    Average packs/day: 1.5 packs/day for 32.0 years (48.0 ttl pk-yrs)    Types: Cigarettes    Start date: 12/30/1972    Quit date: 12/30/2004    Years since quitting: 18.7   Smokeless tobacco: Never   Tobacco comments:    1 ppd x 30 years  Substance Use Topics   Alcohol use: Yes    Comment: 1- 2 beers monthly    Allergies  Allergen Reactions   Singulair [Montelukast Sodium]    Codeine Other (See Comments)    Unknown reaction. Mother told him he was allergic   Cymbalta [Duloxetine Hcl] Nausea And Vomiting   Montelukast Sodium Other (See Comments)    Causes sinusitis    Current Meds  Medication Sig   albuterol (PROVENTIL HFA;VENTOLIN HFA) 108 (90 Base) MCG/ACT inhaler Inhale 1-2 puffs into the lungs every 6 (six) hours as  needed for wheezing or shortness of breath.   albuterol (PROVENTIL) (2.5 MG/3ML) 0.083% nebulizer solution Take 3 mLs (2.5 mg total) by nebulization every 6 (six) hours as needed for wheezing or shortness of breath.   alendronate (FOSAMAX) 70 MG tablet Take 70 mg by mouth once a week.   aspirin EC 81 MG tablet Take 81 mg by mouth daily.   benzonatate (TESSALON PERLES) 100 MG capsule Take 1 capsule (100 mg total) by mouth 3 (three) times daily as needed for cough.   budesonide (PULMICORT) 0.5 MG/2ML nebulizer solution Take 2 mLs (0.5 mg total) by nebulization in the morning and at bedtime.   ipratropium-albuterol (DUONEB) 0.5-2.5 (3) MG/3ML SOLN Take 3 mLs by nebulization every 6 (six) hours as needed (shortness of breath).   lansoprazole (PREVACID) 30 MG capsule Take 1 capsule (30 mg total) by mouth daily.   levothyroxine (SYNTHROID, LEVOTHROID) 125 MCG tablet Take 62.5 mcg by mouth daily before breakfast.    LORazepam (ATIVAN) 0.5 MG tablet Take 0.5 mg by mouth every 8 (eight) hours as needed for anxiety.   mirtazapine (REMERON) 30  MG tablet Take 30 mg by mouth at bedtime.   Morphine Sulfate (MORPHINE CONCENTRATE) 10 mg / 0.5 ml concentrated solution Take 5 mg by mouth every 4 (four) hours as needed for severe pain or shortness of breath.   Multiple Vitamin (MULTIVITAMIN WITH MINERALS) TABS tablet Take 1 tablet by mouth daily. Centrum Silver   nitroGLYCERIN (NITROSTAT) 0.4 MG SL tablet Place 1 tablet (0.4 mg total) under the tongue every 5 (five) minutes as needed for chest pain.   omeprazole (PRILOSEC) 40 MG capsule Take 1 capsule (40 mg total) by mouth daily.   OXYGEN Inhale 2 L/min into the lungs continuous.   pravastatin (PRAVACHOL) 40 MG tablet Take 40 mg by mouth at bedtime.    senna (SENOKOT) 8.6 MG TABS tablet Take 2 tablets by mouth as needed.   temazepam (RESTORIL) 15 MG capsule Take 30 mg by mouth at bedtime.   Vitamin D, Ergocalciferol, (DRISDOL) 50000 UNITS CAPS Take 50,000 Units by  mouth every 14 (fourteen) days.     Immunization History  Administered Date(s) Administered   Influenza Inj Mdck Quad Pf 09/23/2019   Influenza Split 09/13/2009, 01/19/2010, 01/22/2011, 09/09/2011, 09/29/2012, 08/02/2013, 08/30/2013, 09/08/2014, 09/06/2015   Influenza Whole 10/01/2010, 10/02/2011, 09/29/2016   Influenza, Quadrivalent, Recombinant, Inj, Pf 09/08/2020, 09/26/2021   Influenza,inj,Quad PF,6+ Mos 09/15/2018   Influenza-Unspecified 08/27/2016, 10/14/2017, 09/10/2020, 09/29/2022   Moderna Sars-Covid-2 Vaccination 03/16/2020, 04/07/2020, 04/13/2020, 12/27/2020, 05/24/2021, 10/02/2021   Pneumococcal Conjugate-13 12/30/2012, 09/08/2014   Pneumococcal Polysaccharide-23 08/05/2007, 10/30/2009, 02/18/2022   Pneumococcal-Unspecified 06/14/2013   Tdap 05/01/2005, 01/22/2011, 01/22/2011   Zoster, Live 09/09/2016      Objective:     BP 106/66 (BP Location: Left Arm, Patient Position: Sitting, Cuff Size: Normal)   Pulse 77   Temp 97.8 F (36.6 C) (Temporal)   Ht 5\' 9"  (1.753 m)   Wt 163 lb 3.2 oz (74 kg)   SpO2 98%   BMI 24.10 kg/m   SpO2: 98 % O2 Device: None (Room air)  GENERAL: Chronically ill-appearing man, well-nourished, no acute distress, chronic use of accessories of respiration. Raspy voice.   HEAD: Normocephalic, atraumatic.  EYES: Pupils equal, round, reactive to light.  No scleral icterus.  MOUTH: Poor dentition, oral mucosa moist.  No thrush. NECK: Supple. No thyromegaly.  Prior tracheostomy scar. No JVD.  No adenopathy. PULMONARY: Good air entry bilaterally.  Diffuse rhonchi, rare end expiratory wheezes. CARDIOVASCULAR: S1 and S2. Regular rate and rhythm.  No rubs, murmurs or gallops heard. ABDOMEN: Benign. MUSCULOSKELETAL: No joint deformity, no clubbing, no edema.  On limited exam no rashes NEUROLOGIC: No overt focal deficit. SKIN: Intact,warm,dry. PSYCH: Mood depressed and behavior appropriate.     Assessment & Plan:     ICD-10-CM   1. COPD,  severe (HCC)  J44.9    Continue DuoNeb 4 times a day Continue Pulmicort 0.5 mg twice daily Consider Ohtuvayre (patient wants to wait) Check alpha-1 level    2. Chronic respiratory failure with hypoxia (HCC)  J96.11    Continue oxygen as he is doing Patient is compliant, notes benefit of therapy    3. 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 Shortness of breath  R06.02    Multifactorial: Glottic stenosis/COPD/deconditioning    5. Heterozygous alpha 1-antitrypsin deficiency (HCC)  E88.01 Alpha-1-antitrypsin   Will need to recheck alpha-1 levels MZ phenotype    6. Former heavy  cigarette smoker (20-39 per day)  Z87.891    No evidence of recurrence Remains abstinent of cigarettes     Meds ordered this encounter  Medications   ipratropium-albuterol (DUONEB) 0.5-2.5 (3) MG/3ML SOLN    Sig: Take 3 mLs by nebulization every 6 (six) hours as needed (shortness of breath).    Dispense:  360 mL    Refill:  11   We discussed the pulse ability of starting Ohtuvayre however the patient would like to hold off on this for now.  We did refill his DuoNeb.  He is to continue Pulmicort as well.  He is an MZ phenotype and has not had alpha-1 levels checked in a while.  We will check these to ensure that he does not need alpha-1 supplementation.  He had evidence of a pneumonia on his CT abdomen and pelvis obtained on 04 July 2023.  He did receive antibiotics, he has not had follow-up imaging.  He has an upcoming CT abdomen and pelvis and will ask primary care to add a chest CT to that imaging.  Otherwise he seems to be fairly stable.  Will see him in follow-up in 4 months time he is to call sooner should any new problems arise.   Gailen Shelter, MD Advanced Bronchoscopy PCCM [facility] [facility]    *This note was dictated using voice recognition  software/Dragon.  Despite best efforts to proofread, errors can occur which can change the meaning. Any transcriptional errors that result from this process are unintentional and may not be fully corrected at the time of dictation.

## 2023-09-29 NOTE — Patient Instructions (Signed)
I refilled your ipratropium albuterol (DuoNeb).  I will ask Dr.Perini to add the chest imaging to the scan you are going to have in November.  Continue using your oxygen as you are doing.  We will see you in follow-up in 4 months time call sooner should any new problems arise.

## 2023-10-02 ENCOUNTER — Other Ambulatory Visit
Admission: RE | Admit: 2023-10-02 | Discharge: 2023-10-02 | Disposition: A | Discharge status: Home / Self Care | Payer: Medicare Other | Attending: Pulmonary Disease | Admitting: Pulmonary Disease

## 2023-10-02 DIAGNOSIS — E8801 Alpha-1-antitrypsin deficiency: Secondary | ICD-10-CM | POA: Insufficient documentation

## 2023-10-03 LAB — ALPHA-1-ANTITRYPSIN: A-1 Antitrypsin, Ser: 140 mg/dL (ref 101–187)

## 2023-11-14 ENCOUNTER — Other Ambulatory Visit: Payer: Self-pay | Admitting: Internal Medicine

## 2023-11-14 DIAGNOSIS — R590 Localized enlarged lymph nodes: Secondary | ICD-10-CM

## 2023-11-26 ENCOUNTER — Ambulatory Visit
Admission: RE | Admit: 2023-11-26 | Discharge: 2023-11-26 | Disposition: A | Discharge status: Home / Self Care | Payer: Medicare Other | Source: Ambulatory Visit | Attending: Internal Medicine | Admitting: Internal Medicine

## 2023-11-26 DIAGNOSIS — R590 Localized enlarged lymph nodes: Secondary | ICD-10-CM

## 2023-11-26 MED ORDER — IOPAMIDOL (ISOVUE-300) INJECTION 61%
500.0000 mL | Freq: Once | INTRAVENOUS | Status: AC | PRN
  Administered 2023-11-26: 90 mL via INTRAVENOUS

## 2024-06-20 ENCOUNTER — Other Ambulatory Visit: Payer: Self-pay | Admitting: Pulmonary Disease

## 2024-06-21 ENCOUNTER — Other Ambulatory Visit: Payer: Self-pay | Admitting: Pulmonary Disease

## 2024-06-21 NOTE — Telephone Encounter (Signed)
 Okay for 90-day supply with refills.

## 2024-08-25 ENCOUNTER — Other Ambulatory Visit: Payer: Self-pay | Admitting: Urology

## 2024-08-25 DIAGNOSIS — R972 Elevated prostate specific antigen [PSA]: Secondary | ICD-10-CM

## 2024-09-03 ENCOUNTER — Other Ambulatory Visit

## 2024-09-11 ENCOUNTER — Other Ambulatory Visit: Payer: Self-pay | Admitting: Pulmonary Disease

## 2024-09-12 ENCOUNTER — Other Ambulatory Visit

## 2024-09-13 ENCOUNTER — Inpatient Hospital Stay: Admission: RE | Admit: 2024-09-13 | Source: Ambulatory Visit

## 2024-09-20 ENCOUNTER — Ambulatory Visit
Admission: RE | Admit: 2024-09-20 | Discharge: 2024-09-20 | Disposition: A | Discharge status: Home / Self Care | Source: Ambulatory Visit | Attending: Urology | Admitting: Urology

## 2024-09-20 DIAGNOSIS — R972 Elevated prostate specific antigen [PSA]: Secondary | ICD-10-CM

## 2024-09-20 MED ORDER — GADOPICLENOL 0.5 MMOL/ML IV SOLN
7.5000 mL | Freq: Once | INTRAVENOUS | Status: AC | PRN
  Administered 2024-09-20: 7.5 mL via INTRAVENOUS

## 2024-09-23 ENCOUNTER — Ambulatory Visit: Admitting: Pulmonary Disease

## 2024-09-23 ENCOUNTER — Encounter: Payer: Self-pay | Admitting: Pulmonary Disease

## 2024-09-23 VITALS — BP 140/88 | HR 84 | Temp 97.7°F | Ht 69.0 in | Wt 166.8 lb

## 2024-09-23 DIAGNOSIS — J449 Chronic obstructive pulmonary disease, unspecified: Secondary | ICD-10-CM | POA: Diagnosis not present

## 2024-09-23 DIAGNOSIS — Z87891 Personal history of nicotine dependence: Secondary | ICD-10-CM | POA: Diagnosis not present

## 2024-09-23 DIAGNOSIS — J9611 Chronic respiratory failure with hypoxia: Secondary | ICD-10-CM

## 2024-09-23 DIAGNOSIS — J386 Stenosis of larynx: Secondary | ICD-10-CM

## 2024-09-23 DIAGNOSIS — E8801 Alpha-1-antitrypsin deficiency: Secondary | ICD-10-CM | POA: Diagnosis not present

## 2024-09-23 DIAGNOSIS — R0602 Shortness of breath: Secondary | ICD-10-CM

## 2024-09-23 MED ORDER — BUDESONIDE 0.5 MG/2ML IN SUSP: 0.5000 mg | Freq: Two times a day (BID) | RESPIRATORY_TRACT | 6 refills | Status: AC

## 2024-09-23 MED ORDER — IPRATROPIUM-ALBUTEROL 0.5-2.5 (3) MG/3ML IN SOLN: 3.0000 mL | Freq: Four times a day (QID) | RESPIRATORY_TRACT | 6 refills | Status: DC | PRN

## 2024-09-23 NOTE — Progress Notes (Signed)
 Subjective:    Patient ID: Stephen [name], male    DOB: [birth year], 68 y.o.   MRN: [id number]  Patient Care Team: Shayne Anes, MD as PCP - General (Internal Medicine) Nahser, Aleene PARAS, MD (Inactive) as PCP - Cardiology (Cardiology) Frutoso Luz, MD as Consulting Physician (Allergy) Sherrod Sherrod, MD as Consulting Physician (Hematology and Oncology) Izell Domino, MD as Attending Physician (Radiation Oncology) Carlie Clark, MD as Consulting Physician (Otolaryngology) Gusler, Eloise PEDLAR, NP as Nurse Practitioner (Hospice and Palliative Medicine) Nadyne Folks, LCSW (Inactive) as Social Worker (Licensed Clinical Social Worker) Tamea Dedra CROME, MD as Consulting Physician (Pulmonary Disease)  Chief Complaint  Patient presents with   COPD    Cough, shortness of breath and wheezing.    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  He has a history of COPD GOLD class III, Alpha-1 phenotype MZ ([employer]  January 2012, normal alpha-1 level at that time) and prior right lung lobectomy for lung cancer in 2006.  His current respiratory issues are mostly due to significant glottic stenosis however, the patient does not want to undergo tracheostomy which would likely improve his respiratory status. We last saw him on 29 September 2023.  No exacerbations since that time.  HPI Discussed the use of AI scribe software for clinical note transcription with the patient, who gave verbal consent to proceed.  History of Present Illness   Stephen [name] is a 68 year old male with COPD who presents with issues related to his respiratory management and medication refills.  He reports problems with medication refills and oxygen delivery. Recently, he received only one box of  medication instead of the usual six, and his oxygen delivery was delayed by a week due to staffing issues at the supplier.  In May, he experienced shortness of breath after choking at a restaurant. Two home care workers provided a breathing treatment on-site, but his insurance denied the claim, citing it as out of network and not an emergency. He is currently appealing this decision.  Three years ago, he had a COPD flare-up that resulted in an ambulance trip to the hospital. He was discharged without his oxygen and had to take an ambulance home, which was not covered by insurance as it was deemed non-emergent. He expresses frustration with the insurance process and the costs incurred.  He frequently uses nebulizers, especially due to recent weather changes, and has not used inhalers in a long time. He typically uses a nebulizer treatment when feeling 'rough' or experiencing a panic attack. He is currently using ipratropium albuterol  nebulizer treatments and has not heard of Pulmicort , which was previously prescribed.  His family history includes respiratory issues, with a brother who recently passed away from pneumonia and had the same A1 gene deficiency as him. He also mentions a nephew who died unexpectedly, possibly from a heart attack.       Review of Systems A 10 point review of systems was performed and it is as noted above otherwise negative.   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   Protein-calorie malnutrition, severe 06/25/2018   Esophageal dysmotility 06/24/2018   GERD (gastroesophageal reflux disease) 06/24/2018   History of lung cancer and throat  cancer 06/24/2018   FTT (failure to thrive) in adult 06/24/2018   Abnormal echocardiogram 06/24/2018   Hyperlipidemia 05/22/2018   CAD (coronary artery disease)    Elevated troponin  05/04/2018   Acute respiratory failure with hypoxia (HCC) 05/04/2018   Hypothyroid 01/28/2018   Malignant neoplasm of glottis (HCC) 01/13/2018   Dysphonia 12/12/2017   Alpha-1-antitrypsin deficiency carrier 07/17/2016   Eosinophilia 05/30/2016   History of seasonal allergies 05/30/2016   Stopped smoking with greater than 40 pack year history 05/30/2016   History of lobectomy of lung 05/30/2016   History of elevated PSA 07/27/2015   COPD, severe (HCC) 01/12/2015   Research study patient 10/07/2014   Need for prophylactic vaccination against Streptococcus pneumoniae (pneumococcus) 09/15/2014   Chest pain 08/09/2013   History of TIA (transient ischemic attack) 08/09/2013   Otitis media of left ear 08/09/2013   Lung cancer (HCC) 09/16/2012   Hemorrhoids, external, thrombosed 08/26/2011   Backache 02/22/2011   COPD (chronic obstructive pulmonary disease) (HCC) 02/06/2011   ROTATOR CUFF TEAR 02/15/2010   ALLERGIC RHINITIS 04/15/2008    Social History   Tobacco Use   Smoking status: Former    Current packs/day: 0.00    Average packs/day: 1.5 packs/day for 32.0 years (48.0 ttl pk-yrs)    Types: Cigarettes    Start date: 12/30/1972    Quit date: 12/30/2004    Years since quitting: 19.8   Smokeless tobacco: Never   Tobacco comments:    1 ppd x 30 years  Substance Use Topics   Alcohol use: Yes    Comment: 1- 2 beers monthly    Allergies  Allergen Reactions   Singulair [Montelukast Sodium]    Codeine Other (See Comments)    Unknown reaction. Mother told him he was allergic   Cymbalta [Duloxetine Hcl] Nausea And Vomiting   Montelukast Sodium Other (See Comments)    Causes sinusitis    Current Meds  Medication Sig   albuterol  (PROVENTIL ) (2.5 MG/3ML) 0.083% nebulizer solution Take 3 mLs (2.5 mg total) by nebulization every 6 (six) hours as needed for wheezing or shortness of breath.   alendronate (FOSAMAX) 70 MG tablet Take 70 mg by mouth once a week.   aspirin  EC 81 MG tablet  Take 81 mg by mouth daily.   benzonatate  (TESSALON  PERLES) 100 MG capsule Take 1 capsule (100 mg total) by mouth 3 (three) times daily as needed for cough.   lansoprazole  (PREVACID ) 30 MG capsule Take 1 capsule (30 mg total) by mouth daily.   levothyroxine  (SYNTHROID , LEVOTHROID) 125 MCG tablet Take 62.5 mcg by mouth daily before breakfast.    LORazepam  (ATIVAN ) 0.5 MG tablet Take 0.5 mg by mouth every 8 (eight) hours as needed for anxiety.   mirtazapine  (REMERON ) 30 MG tablet Take 30 mg by mouth at bedtime.   Morphine Sulfate (MORPHINE CONCENTRATE) 10 mg / 0.5 ml concentrated solution Take 5 mg by mouth every 4 (four) hours as needed for severe pain or shortness of breath.   Multiple Vitamin (MULTIVITAMIN WITH MINERALS) TABS tablet Take 1 tablet by mouth daily. Centrum Silver   nitroGLYCERIN  (NITROSTAT ) 0.4 MG SL tablet Place 1 tablet (0.4 mg total) under the tongue every 5 (five) minutes as needed for chest pain.   omeprazole  (PRILOSEC) 40 MG capsule Take 1 capsule (40 mg total) by mouth daily.   OXYGEN Inhale 2 L/min into the lungs continuous.   pravastatin  (PRAVACHOL ) 40 MG tablet Take 40 mg by mouth at bedtime.    senna (SENOKOT)  8.6 MG TABS tablet Take 2 tablets by mouth as needed.   temazepam  (RESTORIL ) 15 MG capsule Take 30 mg by mouth at bedtime.   Vitamin D , Ergocalciferol , (DRISDOL ) 50000 UNITS CAPS Take 50,000 Units by mouth every 14 (fourteen) days.  (Patient not taking: Reported on 10/05/2024)   [DISCONTINUED] budesonide  (PULMICORT ) 0.5 MG/2ML nebulizer solution USE 2 ML(0.5 MG) VIA NEBULIZER IN THE MORNING AND AT BEDTIME. NEED OFFICE VISIT BEFORE REFILLS.   [DISCONTINUED] ipratropium-albuterol  (DUONEB) 0.5-2.5 (3) MG/3ML SOLN USE 3 ML VIA NEBULIZER EVERY 6 HOURS AS NEEDED FOR SHORTNESS OF BREATH    Immunization History  Administered Date(s) Administered   Fluzone Influenza virus vaccine,trivalent (IIV3), split virus 09/09/2011   Influenza Inj Mdck Quad Pf 09/23/2019   Influenza  Split 09/13/2009, 01/19/2010, 01/22/2011, 09/29/2012, 08/02/2013, 08/30/2013, 09/08/2014, 09/06/2015   Influenza Whole 10/01/2010, 10/02/2011, 09/29/2016   Influenza, Quadrivalent, Recombinant, Inj, Pf 09/08/2020, 09/26/2021   Influenza,inj,Quad PF,6+ Mos 09/15/2018   Influenza-Unspecified 08/27/2016, 10/14/2017, 09/10/2020, 09/29/2022   Moderna Sars-Covid-2 Vaccination 03/16/2020, 04/07/2020, 04/13/2020, 12/27/2020, 05/24/2021, 10/02/2021   Pneumococcal Conjugate-13 12/30/2012, 09/08/2014   Pneumococcal Polysaccharide-23 08/05/2007, 10/30/2009, 02/18/2022   Pneumococcal-Unspecified 06/14/2013   Tdap 05/01/2005, 01/22/2011, 01/22/2011   Zoster, Live 09/09/2016        Objective:     BP (!) 140/88   Pulse 84   Temp 97.7 F (36.5 C) (Temporal)   Ht 5' 9 (1.753 m)   Wt 166 lb 12.8 oz (75.7 kg)   SpO2 97%   BMI 24.63 kg/m   SpO2: 97 %  GENERAL: Chronically ill-appearing man, well-nourished, no acute distress, chronic use of accessories of respiration. Raspy voice.   HEAD: Normocephalic, atraumatic.  EYES: Pupils equal, round, reactive to light.  No scleral icterus.  MOUTH: Poor dentition, oral mucosa moist.  No thrush. NECK: Supple. No thyromegaly.  Prior tracheostomy scar. No JVD.  No adenopathy. PULMONARY: Good air entry bilaterally.  Coarse, otherwise, no adventitious sounds. CARDIOVASCULAR: S1 and S2. Regular rate and rhythm.  No rubs, murmurs or gallops heard. ABDOMEN: Benign. MUSCULOSKELETAL: No joint deformity, no clubbing, no edema.  On limited exam no rashes NEUROLOGIC: No overt focal deficit. SKIN: Intact,warm,dry. PSYCH: Mood jovial, behavior appropriate.   Assessment & Plan:     ICD-10-CM   1. COPD, severe (HCC)  J44.9     2. Chronic respiratory failure with hypoxia (HCC)  J96.11     3. 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 Shortness of breath  R06.02     5. Heterozygous alpha 1-antitrypsin deficiency (HCC)  E88.01      Meds ordered this encounter   Medications   budesonide  (PULMICORT ) 0.5 MG/2ML nebulizer solution    Sig: Take 2 mLs (0.5 mg total) by nebulization in the morning and at bedtime.    Dispense:  180 mL    Refill:  6    **Patient requests 90 days supply**   ipratropium-albuterol  (DUONEB) 0.5-2.5 (3) MG/3ML SOLN    Sig: Take 3 mLs by nebulization every 6 (six) hours as needed.    Dispense:  360 mL    Refill:  6    **Patient requests 90 days supply**   Discussion:    Chronic obstructive pulmonary disease (COPD) with alpha-1 antitrypsin MZ phenotype COPD with heterozygous alpha-1 antitrypsin carrier status.  Normal alpha-1 levels reports increased nebulizer use due to weather changes. Prefers nebulizers over inhalers. Insurance issues with previous treatment.  - Prescribe Pulmicort  (budesonide ) for nebulizer use twice daily. - Continue current nebulizer regimen with  ipratropium and albuterol  combination up to four times daily as needed. - Ensure refills for Pulmicort  and other nebulizer medications are available. - Schedule follow-up appointment in four months.   Overall [name] appears to be doing well.  Continue nebulizer treatments as above.  Advised if symptoms do not improve or worsen, to please contact office for sooner follow up or seek emergency care.    I spent 33 minutes of dedicated to the care of this patient on the date of this encounter to include pre-visit review of records, face-to-face time with the patient discussing conditions above, post visit ordering of testing, clinical documentation with the electronic health record, making appropriate referrals as documented, and communicating necessary findings to members of the patients care team.     C. Leita Sanders, MD Advanced Bronchoscopy PCCM [facility]    *This note was generated using voice recognition software/Dragon and/or AI transcription program.  Despite best efforts to proofread, errors can occur which can change the meaning.  Any transcriptional errors that result from this process are unintentional and may not be fully corrected at the time of dictation.

## 2024-09-23 NOTE — Patient Instructions (Signed)
 VISIT SUMMARY:  Today, we discussed your ongoing management of COPD and addressed issues related to your medication refills and oxygen delivery. We also reviewed your recent experiences with shortness of breath and insurance challenges.  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 You will start using Pulmicort  (budesonide ) in your nebulizer twice daily. Continue your current nebulizer treatments with ipratropium and albuterol  up to four times daily as needed. We will ensure you have refills for all your nebulizer medications.  INSTRUCTIONS:  Please schedule a follow-up appointment in four months.

## 2024-09-27 ENCOUNTER — Other Ambulatory Visit: Payer: Self-pay | Admitting: Registered Nurse

## 2024-09-27 DIAGNOSIS — Z860100 Personal history of colon polyps, unspecified: Secondary | ICD-10-CM

## 2024-09-27 DIAGNOSIS — K921 Melena: Secondary | ICD-10-CM

## 2024-09-27 DIAGNOSIS — R194 Change in bowel habit: Secondary | ICD-10-CM

## 2024-09-27 DIAGNOSIS — R1030 Lower abdominal pain, unspecified: Secondary | ICD-10-CM

## 2024-09-28 ENCOUNTER — Encounter: Payer: Self-pay | Admitting: Radiology

## 2024-09-28 ENCOUNTER — Ambulatory Visit
Admission: RE | Admit: 2024-09-28 | Discharge: 2024-09-28 | Disposition: A | Discharge status: Home / Self Care | Source: Ambulatory Visit | Attending: Registered Nurse | Admitting: Registered Nurse

## 2024-09-28 DIAGNOSIS — R194 Change in bowel habit: Secondary | ICD-10-CM

## 2024-09-28 DIAGNOSIS — K921 Melena: Secondary | ICD-10-CM

## 2024-09-28 DIAGNOSIS — Z860100 Personal history of colon polyps, unspecified: Secondary | ICD-10-CM

## 2024-09-28 DIAGNOSIS — R1030 Lower abdominal pain, unspecified: Secondary | ICD-10-CM

## 2024-09-28 MED ORDER — IOPAMIDOL (ISOVUE-300) INJECTION 61%
100.0000 mL | Freq: Once | INTRAVENOUS | Status: AC | PRN
  Administered 2024-09-28: 100 mL via INTRAVENOUS

## 2024-10-05 ENCOUNTER — Ambulatory Visit: Admitting: Gastroenterology

## 2024-10-05 ENCOUNTER — Encounter: Payer: Self-pay | Admitting: Gastroenterology

## 2024-10-05 VITALS — BP 102/74 | HR 76 | Ht 69.0 in | Wt 169.0 lb

## 2024-10-05 DIAGNOSIS — K219 Gastro-esophageal reflux disease without esophagitis: Secondary | ICD-10-CM | POA: Diagnosis not present

## 2024-10-05 DIAGNOSIS — R933 Abnormal findings on diagnostic imaging of other parts of digestive tract: Secondary | ICD-10-CM | POA: Diagnosis not present

## 2024-10-05 DIAGNOSIS — Z860101 Personal history of adenomatous and serrated colon polyps: Secondary | ICD-10-CM

## 2024-10-05 DIAGNOSIS — R1030 Lower abdominal pain, unspecified: Secondary | ICD-10-CM

## 2024-10-05 DIAGNOSIS — R198 Other specified symptoms and signs involving the digestive system and abdomen: Secondary | ICD-10-CM

## 2024-10-05 DIAGNOSIS — K5909 Other constipation: Secondary | ICD-10-CM

## 2024-10-05 DIAGNOSIS — R194 Change in bowel habit: Secondary | ICD-10-CM

## 2024-10-05 NOTE — Patient Instructions (Signed)
 Use over the counter glycerine suppositories and use as needed.  _______________________________________________________  If your blood pressure at your visit was 140/90 or greater, please contact your primary care physician to follow up on this.  _______________________________________________________  If you are age 68 or older, your body mass index should be between 23-30. Your Body mass index is 24.96 kg/m. If this is out of the aforementioned range listed, please consider follow up with your Primary Care Provider.  If you are age 33 or younger, your body mass index should be between 19-25. Your Body mass index is 24.96 kg/m. If this is out of the aformentioned range listed, please consider follow up with your Primary Care Provider.   ________________________________________________________  The [facility] providers would like to encourage you to use MYCHART to communicate with providers for non-urgent requests or questions.  Due to long hold times on the telephone, sending your provider a message by [facility] may be a faster and more efficient way to get a response.  Please allow 48 business hours for a response.  Please remember that this is for non-urgent requests.  _______________________________________________________  [facility] is using a team-based approach to care.  Your team is made up of your doctor and two to three APPS. Our APPS (Nurse Practitioners and Physician Assistants) work with your physician to ensure care continuity for you. They are fully qualified to address your health concerns and develop a treatment plan. They communicate directly with your gastroenterologist to care for you. Seeing the Advanced Practice Practitioners on your physician's team can help you by facilitating care more promptly, often allowing for earlier appointments, access to diagnostic testing, procedures, and other specialty referrals.    Thank you for trusting me with your  gastrointestinal care!    Dr. Victory Legrand DOUGLAS [facility]

## 2024-10-05 NOTE — Progress Notes (Signed)
    Assessment & Plan:  There are no diagnoses linked to this encounter.  Patient Instructions  Follow up 3 months    Please note: late entry documentation due to logistical difficulties during COVID-19 pandemic. This note is filed for information purposes only, and is not intended to be used for billing, nor does it represent the full scope/nature of the visit in question. Please see any associated scanned media linked to date of encounter for additional pertinent information.  Subjective:    HPI: Stephen [name] is a 68 y.o. male presenting to the pulmonology clinic on 06/12/2020 with report of: Follow-up (has good days and bad days.  productive cough light green/yellow last few days)     Outpatient Encounter Medications as of 06/12/2020  Medication Sig   aspirin  EC 81 MG tablet Take 81 mg by mouth daily.   levothyroxine  (SYNTHROID , LEVOTHROID) 125 MCG tablet Take 62.5 mcg by mouth daily before breakfast.    LORazepam  (ATIVAN ) 0.5 MG tablet Take 0.5 mg by mouth every 8 (eight) hours as needed for anxiety.   Morphine Sulfate (MORPHINE CONCENTRATE) 10 mg / 0.5 ml concentrated solution Take 5 mg by mouth every 4 (four) hours as needed for severe pain or shortness of breath.   Multiple Vitamin (MULTIVITAMIN WITH MINERALS) TABS tablet Take 1 tablet by mouth daily. Centrum Silver   nitroGLYCERIN  (NITROSTAT ) 0.4 MG SL tablet Place 1 tablet (0.4 mg total) under the tongue every 5 (five) minutes as needed for chest pain.   omeprazole  (PRILOSEC) 40 MG capsule Take 1 capsule (40 mg total) by mouth daily.   pravastatin  (PRAVACHOL ) 40 MG tablet Take 40 mg by mouth at bedtime.    temazepam  (RESTORIL ) 15 MG capsule Take 30 mg by mouth at bedtime.   Vitamin D , Ergocalciferol , (DRISDOL ) 50000 UNITS CAPS Take 50,000 Units by mouth every 14 (fourteen) days.  (Patient not taking: Reported on 10/05/2024)   [DISCONTINUED] albuterol  (PROVENTIL  HFA;VENTOLIN  HFA) 108 (90 Base) MCG/ACT inhaler Inhale 1-2  puffs into the lungs every 6 (six) hours as needed for wheezing or shortness of breath. (Patient not taking: Reported on 09/23/2024)   [DISCONTINUED] albuterol  (PROVENTIL ) (2.5 MG/3ML) 0.083% nebulizer solution Take 2.5 mg by nebulization every 6 (six) hours as needed for wheezing or shortness of breath.   [DISCONTINUED] benzonatate  (TESSALON ) 100 MG capsule Take 100 mg by mouth 3 (three) times daily as needed for cough.   [DISCONTINUED] budesonide  (PULMICORT ) 0.25 MG/2ML nebulizer solution USE 1 VIAL VIA NEBULIZER TWICE DAILY   [DISCONTINUED] ipratropium-albuterol  (DUONEB) 0.5-2.5 (3) MG/3ML SOLN Take 3 mLs by nebulization every 6 (six) hours as needed (shortness of breath).   [DISCONTINUED] lansoprazole  (PREVACID  SOLUTAB) 30 MG disintegrating tablet Take 1 tablet (30 mg total) by mouth daily at 12 noon.   [DISCONTINUED] lansoprazole  (PREVACID ) 30 MG capsule Take 1 capsule (30 mg total) by mouth daily.   [DISCONTINUED] mirtazapine  (REMERON ) 15 MG tablet Take 1 tablet (15 mg total) by mouth at bedtime.   No facility-administered encounter medications on file as of 06/12/2020.      Objective:   Vitals:   06/12/20 1153  BP: 120/76  Pulse: 86  Temp: 97.7 F (36.5 C)  Height: 5' 10 (1.778 m)  Weight: 187 lb 9.6 oz (85.1 kg)  SpO2: 96%  TempSrc: Oral  BMI (Calculated): 26.92     Physical exam documentation is limited by delayed entry of information.

## 2024-10-05 NOTE — Progress Notes (Signed)
 [facility] Consult Note:  History: Stephen [name] 10/05/2024  Referring provider: Shayne Anes, MD  Reason for consult/chief complaint: Diarrhea (No black stools ,diarrhea has calmed down,a little constipation,) and Abdominal Pain (Pain here and there nothing like he was)   Subjective  Prior history:  From Dr. Albin most recent office note December 2023:  Abnormal CT of the sigmoid colon with wall thickening, likely related to diverticulosis. 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 After a discussion we will proceed with with an ACBE. MiraLAX  qd or Senokot qd as needed for constipation Continue omeprazole  40 mg daily and follow antireflux measures Ongoing follow-up with PCP     HPI    This is a 68 year old male with alternating soft stools and constipation, an abnormal CT of the sigmoid colon and a personal history of adenomatous colon polyps. 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 He note frequent dyspnea.  He has mild chronic constipation.  Several symptoms consistent with GERD which are controlled on current medications.    CT AP 09/14/2022 1. Circumferential bowel wall thickening of the mid to distal sigmoid colon suggestive of colitis. Recommend colonoscopy status post treatment and status post complete resolution of inflammatory changes to exclude an underlying lesion. 2. Colonic diverticulosis with no definite acute diverticulitis. 3.  Small hiatal hernia. 4. Aortic Atherosclerosis   Colonoscopy 04/2015 1. Sessile polyp in the transverse colon; polypectomy performed with cold forceps 2. Sessile polyp in the sigmoid colon; polypectomy performed with a cold snare 3. Moderate diverticulosis in the descending colon and sigmoid colon 4. Mild diverticulosis in the transverse colon, ascending colon, and at the cecum 5. Grade l internal hemorrhoids Path: one adenoma, one hyperplastic   Discussed the use of AI scribe software for clinical note transcription with the patient, who gave verbal consent to proceed.  History of Present Illness Stephen [name] Stephen [name] is a 68 year old male who presents with gastrointestinal symptoms including diarrhea and abdominal pain. He was referred by his primary care clinic for evaluation of gastrointestinal symptoms.  He experiences significant gastrointestinal symptoms, primarily characterized by diarrhea and severe abdominal pain. The stool is described as 'real mushy or soft' with difficulty in passing despite the urge to defecate. He has attempted to manage these symptoms with laxatives and enemas, which have provided limited relief. His symptoms are intermittent, with periods of constipation lasting three to four days followed by episodes of loose stools. He also experiences gas pains, which he states can 'wreck' his day or weekend.  He has noted black stools during this recent episode of abdominal pain and altered bowel habits which he attributes to the use of Pepto-Bismol and Milk of Magnesia. He has been using various over-the-counter medications to manage his bowel movements.  At this point, he is back to his usual baseline of a BM every 2 or 3 days that is usually small and pellet-like with feelings of incomplete evacuation.  The abdominal pain has significantly subsided and amounts of some  bloating.  No bright red blood per rectum, and the black stool resolved.   ROS:  Review of  Systems  Constitutional:  Negative for appetite change and unexpected weight change.  HENT:  Negative for mouth sores and voice change.   Eyes:  Negative for pain and redness.  Respiratory:  Positive for shortness of breath. Negative for cough.   Cardiovascular:  Negative for chest pain and palpitations.  Genitourinary:  Negative for dysuria and hematuria.  Musculoskeletal:  Negative for arthralgias and myalgias.  Skin:  Negative for pallor and rash.  Neurological:  Negative for weakness and headaches.  Hematological:  Negative for adenopathy.     Past Medical History: Past Medical History:  Diagnosis Date   Allergic rhinitis, cause unspecified    Anxiety    Asthma    CAD (coronary artery disease)    Coronary CTA 7/19: Calcium score 0, LAD with proximal discrete area of soft plaque;  Mid LAD FFR 0.86 - No hemodynamically significant coronary stenoses by CT FFR.   Complication of anesthesia    difficulty awaking after colonoscopy    Depression    Dyspnea    GERD (gastroesophageal reflux disease)    Hypothyroidism    Lung cancer (HCC)    lung ca dx 06, has had chemo and radiation   Osteoporosis    Other emphysema (HCC)    copd   Throat cancer (HCC)    throat ca dx 2007   Thrombosed hemorrhoids    Tubular adenoma of colon 2009   Unspecified vitamin D  deficiency      Past Surgical History: Past Surgical History:  Procedure Laterality Date   COLONOSCOPY     HERNIA REPAIR     as a baby- not sure where hernia was   KNEE SURGERY Left 1972   .  has hardware   LUNG LOBECTOMY     RUL   MICROLARYNGOSCOPY WITH CO2 LASER AND EXCISION OF VOCAL CORD LESION N/A 12/19/2017   Procedure: MICROLARYNGOSCOPY WITH BIOPSY;  Surgeon: Carlie Clark, MD;  Location: [facility] OR;  Service: ENT;  Laterality: N/A;   ROTATOR CUFF REPAIR Left    SHOULDER SURGERY Right    rotator cuff   THROAT SURGERY     Laser surgery for throat cancer   TRANSTHORACIC ECHOCARDIOGRAM  01/13/2012   EF=>55%; trace  MR/TR;      Family History: Family History  Problem Relation Age of Onset   Cancer Mother        Brain tumor   COPD Mother    Heart disease Father        CABG   Prostate cancer Father    Cystic fibrosis Sister        also heart failure   Mental illness Brother    Heart failure Paternal Grandmother    Colon cancer Neg Hx    Colon polyps Neg Hx    Esophageal cancer Neg Hx     Social History: Social History   Socioeconomic History   Marital status: Single    Spouse name: Not on file   Number of children: 0   Years of education: Not on file   Highest education level: Not on file  Occupational History   Occupation: Disabled    Employer: UNEMPLOYED  Tobacco Use   Smoking status: Former    Current packs/day: 0.00    Average packs/day: 1.5 packs/day for 32.0 years (48.0 ttl pk-yrs)    Types: Cigarettes    Start date: 12/30/1972  Quit date: 12/30/2004    Years since quitting: 19.7   Smokeless tobacco: Never   Tobacco comments:    1 ppd x 30 years  Vaping Use   Vaping status: Never Used  Substance and Sexual Activity   Alcohol use: Yes    Comment: 1- 2 beers monthly   Drug use: No   Sexual activity: Not on file  Other Topics Concern   Not on file  Social History Narrative   Not on file   Social Drivers of Health   Financial Resource Strain: Not on file  Food Insecurity: Not on file  Transportation Needs: Not on file  Physical Activity: Not on file  Stress: Not on file  Social Connections: Not on file    Allergies: Allergies  Allergen Reactions   Singulair [Montelukast Sodium]    Codeine Other (See Comments)    Unknown reaction. Mother told him he was allergic   Cymbalta [Duloxetine Hcl] Nausea And Vomiting   Montelukast Sodium Other (See Comments)    Causes sinusitis    Outpatient Meds: Current Outpatient Medications  Medication Sig Dispense Refill   albuterol  (PROVENTIL ) (2.5 MG/3ML) 0.083% nebulizer solution Take 3 mLs (2.5 mg total) by  nebulization every 6 (six) hours as needed for wheezing or shortness of breath. 75 mL 6   alendronate (FOSAMAX) 70 MG tablet Take 70 mg by mouth once a week.     aspirin  EC 81 MG tablet Take 81 mg by mouth daily.     benzonatate  (TESSALON  PERLES) 100 MG capsule Take 1 capsule (100 mg total) by mouth 3 (three) times daily as needed for cough. 30 capsule 2   budesonide  (PULMICORT ) 0.5 MG/2ML nebulizer solution Take 2 mLs (0.5 mg total) by nebulization in the morning and at bedtime. 180 mL 6   ipratropium-albuterol  (DUONEB) 0.5-2.5 (3) MG/3ML SOLN Take 3 mLs by nebulization every 6 (six) hours as needed. 360 mL 6   lansoprazole  (PREVACID ) 30 MG capsule Take 1 capsule (30 mg total) by mouth daily. 30 capsule 1   levothyroxine  (SYNTHROID , LEVOTHROID) 125 MCG tablet Take 62.5 mcg by mouth daily before breakfast.      LORazepam  (ATIVAN ) 0.5 MG tablet Take 0.5 mg by mouth every 8 (eight) hours as needed for anxiety.     mirtazapine  (REMERON ) 30 MG tablet Take 30 mg by mouth at bedtime.     Morphine Sulfate (MORPHINE CONCENTRATE) 10 mg / 0.5 ml concentrated solution Take 5 mg by mouth every 4 (four) hours as needed for severe pain or shortness of breath.     Multiple Vitamin (MULTIVITAMIN WITH MINERALS) TABS tablet Take 1 tablet by mouth daily. Centrum Silver     nitroGLYCERIN  (NITROSTAT ) 0.4 MG SL tablet Place 1 tablet (0.4 mg total) under the tongue every 5 (five) minutes as needed for chest pain. 20 tablet 0   omeprazole  (PRILOSEC) 40 MG capsule Take 1 capsule (40 mg total) by mouth daily. 30 capsule 11   OXYGEN Inhale 2 L/min into the lungs continuous.     pravastatin  (PRAVACHOL ) 40 MG tablet Take 40 mg by mouth at bedtime.      senna (SENOKOT) 8.6 MG TABS tablet Take 2 tablets by mouth as needed.     temazepam  (RESTORIL ) 15 MG capsule Take 30 mg by mouth at bedtime.     Vitamin D , Ergocalciferol , (DRISDOL ) 50000 UNITS CAPS Take 50,000 Units by mouth every 14 (fourteen) days.  (Patient not taking:  Reported on 10/05/2024)     No current  facility-administered medications for this visit.      ___________________________________________________________________ Objective   Exam:  BP 102/74   Pulse 76   Ht 5' 9 (1.753 m)   Wt 169 lb (76.7 kg)   BMI 24.96 kg/m  Wt Readings from Last 3 Encounters:  10/05/24 169 lb (76.7 kg)  09/23/24 166 lb 12.8 oz (75.7 kg)  09/29/23 163 lb 3.2 oz (74 kg)    General: Chronically ill-appearing, gravelly vocal quality, dyspneic at rest CV: Regular without appreciable murmur, no JVD, no peripheral edema Resp: Globally decreased breath sounds bilaterally, episodically dyspneic at rest during the visit, no respiratory distress or accessory muscle use GI: soft, no focal tenderness, with active bowel sounds. No guarding or palpable organomegaly noted.  Scaphoid abdomen Skin; warm and dry, no rash or jaundice noted Neuro: awake, alert and oriented x 3. Normal gross motor function and fluent speech Rectal exam: Normal perianal, normal resting involuntary sphincter tone.  No fissure tenderness or palpable internal lesion.  Firm stool in rectal vault  Labs:  PCP labs dated 09/27/2024 included with referral  WBC 8.5, hemoglobin 14.7, platelet 226 CMP normal except glucose 118  Radiologic Studies:  CLINICAL DATA:  Patient underwent CT abdomen pelvis 09/13/2022 that showed mid to distal sigmoid colon suggestive of colitis. Patient was referred for fluoroscopic barium enema study due to being high-risk for procedures with sedation and not a candidate for colonoscopy.   EXAM: AIR CONTRAST BARIUM ENEMA   TECHNIQUE: Initial scout AP supine abdominal image obtained to insure adequate colon cleansing. Barium was introduced into the colon in a retrograde fashion.   FLUOROSCOPY: Radiation Exposure Index (as provided by the fluoroscopic device): 0.90 mGy Kerma   COMPARISON:  CT of 09/13/2022 and 04/03/2007   FINDINGS: Preprocedure scout film  demonstrates a nonobstructive bowel-gas pattern. Exam was initially attempted to be a double contrast barium enema. Initial contrast administration via the rectum demonstrates grossly normal appearance of the rectum. Extensive left-sided diverticulosis. The mid and distal sigmoid are relatively well distended, without dominant circumferential mass. Suboptimal distension of the descending and proximal sigmoid colon.   Contrast flowed to the level of the splenic flexure. Despite multiple maneuvers, contrast could not extend more proximally. At this point, patient was unable to hold the contrast column and evacuation/leakage occurred.   IMPRESSION: 1. Limited, single-contrast exam could only be performed to the level of the splenic flexure before patient was unable to hold the contrast which was evacuated. 2. The area of wall thickening within the mid to distal sigmoid is relatively well distended today, without circumferential mass. The more proximal sigmoid and descending colon are underdistended throughout, with extensive diverticulosis. When comparing to the 09/13/2022 CT to that of 04/02/2007, areas of sigmoid wall thickening are identified on both exams, favored to represent muscular hypertrophy in the setting of marked diverticulosis. An underlying neoplasm is felt unlikely but is overall poorly evaluated on this limited exam.     Electronically Signed   By: Rockey Kilts M.D.   On: 12/13/2022 14:23    ______________________________ CLINICAL DATA:  Intermittent bilateral lower quadrant abdominal pain for years.   EXAM: CT ABDOMEN AND PELVIS WITH CONTRAST   TECHNIQUE: Multidetector CT imaging of the abdomen and pelvis was performed using the standard protocol following bolus administration of intravenous contrast.   RADIATION DOSE REDUCTION: This exam was performed according to the departmental dose-optimization program which includes automated exposure control,  adjustment of the mA and/or kV according to patient size and/or use  of iterative reconstruction technique.   CONTRAST:  100mL ISOVUE -300 IOPAMIDOL  (ISOVUE -300) INJECTION 61%   COMPARISON:  11/26/2023   FINDINGS: Lower chest: Stable changes of COPD at the lung bases with extensive paraseptal emphysema on the right. Centrilobular emphysema is also demonstrated at both lung bases. Small to moderate-sized pericardial effusion is again demonstrated with a thickness of 1.4 cm. Normal-sized heart.   Hepatobiliary: Again demonstrated is a large gallstone in the gallbladder, measuring 1.6 cm. Mild gallbladder wall thickening with low-density edema and minimal pericholecystic fluid. Unremarkable liver.   Pancreas: Choose wound   Spleen: Normal in size without focal abnormality.   Adrenals/Urinary Tract: Adrenal glands are unremarkable. Kidneys are normal, without renal calculi, focal lesion, or hydronephrosis. Bladder is unremarkable.   Stomach/Bowel: Colonic diverticulosis without evidence of diverticulitis. This involves the entire colon, most pronounced in the sigmoid region. Unremarkable stomach and small bowel. Normal-appearing appendix.   Vascular/Lymphatic: No significant vascular findings are present. No enlarged abdominal or pelvic lymph nodes.   Reproductive: Mildly enlarged prostate gland.   Other: Moderate-sized left inguinal hernia containing fat, unchanged. Minimal umbilical hernia containing fat.   Musculoskeletal: Stable small L1 vertebral hemangioma. Mild lumbar and lower thoracic spine degenerative changes.   IMPRESSION: 1. Cholelithiasis with mild gallbladder wall thickening, low-density wall edema and minimal pericholecystic fluid. This could be due to acute cholecystitis or ongoing chronic cholecystitis. 2. Colonic diverticulosis without evidence of diverticulitis. 3. Stable small to moderate-sized pericardial effusion. 4. Stable changes of COPD at the lung  bases with paraseptal and centrilobular emphysema 5. Stable moderate-sized left inguinal hernia containing fat.   Emphysema (ICD10-J43.9).     Electronically Signed   By: Elspeth Bathe M.D.   On: 09/28/2024 15:36  (CT images personally reviewed)   Encounter Diagnoses  Name Primary?   Chronic constipation Yes   Change in bowel habits    Lower abdominal pain    Abnormal finding on GI tract imaging     Assessment and Plan Assessment & Plan Diverticulosis of colon with chronic constipation and intermittent diarrhea when he takes multiple low CT treatments to relieve the constipation.  CT scan suggested colitis, but findings attributed to thickened colon wall muscles due to diverticulosis. Black stool likely from Pepto-Bismol taken during his recent episode of constipation.  This appears to be a chronic issue for him with a recent exacerbation of constipation related pain. Similar situation described when he last saw Dr. Aneita.  He does not appear to have GI bleeding at this juncture.  I agree with Dr. Albin previous assessment that Stephen [name] is at high risk for respiratory complications of sedation from any endoscopic procedures, and therefore would not recommend any routine polyp surveillance colonoscopy.  This patient's constipation is also partially due to poor diet based on his description. - Consider dietary modifications focusing on fiber intake.  Take a daily fiber supplement such as Metamucil or Citrucel  If no BM for 2 days, use a glycerin suppository  Return to office as needed    35 minutes were spent on this encounter, including in depth chart review, independent review of results as outlined above, communicating results with the patient directly, face-to-face time with the patient, coordinating care, ordering studies and medications as appropriate, and documentation.    Stephen [name] Brand III  CC: Referring provider noted above

## 2024-10-26 ENCOUNTER — Other Ambulatory Visit: Payer: Self-pay | Admitting: Pulmonary Disease

## 2025-02-22 ENCOUNTER — Ambulatory Visit: Admitting: Pulmonary Disease
